# Patient Record
Sex: Female | Born: 1935 | Race: White | Hispanic: No | Smoking: Never smoker
Health system: Southern US, Community
[De-identification: ages and names within clinical notes are randomized; demographics above are authoritative.]

## PROBLEM LIST (undated history)

## (undated) DIAGNOSIS — R040 Epistaxis: Secondary | ICD-10-CM

## (undated) DIAGNOSIS — M75120 Complete rotator cuff tear or rupture of unspecified shoulder, not specified as traumatic: Secondary | ICD-10-CM

## (undated) DIAGNOSIS — L259 Unspecified contact dermatitis, unspecified cause: Secondary | ICD-10-CM

## (undated) DIAGNOSIS — N289 Disorder of kidney and ureter, unspecified: Secondary | ICD-10-CM

## (undated) DIAGNOSIS — F0390 Unspecified dementia without behavioral disturbance: Secondary | ICD-10-CM

## (undated) DIAGNOSIS — J449 Chronic obstructive pulmonary disease, unspecified: Secondary | ICD-10-CM

## (undated) DIAGNOSIS — M1990 Unspecified osteoarthritis, unspecified site: Secondary | ICD-10-CM

## (undated) DIAGNOSIS — N3090 Cystitis, unspecified without hematuria: Secondary | ICD-10-CM

## (undated) DIAGNOSIS — IMO0002 Reserved for concepts with insufficient information to code with codable children: Secondary | ICD-10-CM

## (undated) DIAGNOSIS — M8580 Other specified disorders of bone density and structure, unspecified site: Secondary | ICD-10-CM

## (undated) DIAGNOSIS — N12 Tubulo-interstitial nephritis, not specified as acute or chronic: Secondary | ICD-10-CM

## (undated) DIAGNOSIS — N179 Acute kidney failure, unspecified: Secondary | ICD-10-CM

## (undated) DIAGNOSIS — G459 Transient cerebral ischemic attack, unspecified: Secondary | ICD-10-CM

## (undated) DIAGNOSIS — T7840XA Allergy, unspecified, initial encounter: Secondary | ICD-10-CM

## (undated) DIAGNOSIS — G40909 Epilepsy, unspecified, not intractable, without status epilepticus: Secondary | ICD-10-CM

## (undated) DIAGNOSIS — I10 Essential (primary) hypertension: Secondary | ICD-10-CM

## (undated) DIAGNOSIS — F419 Anxiety disorder, unspecified: Secondary | ICD-10-CM

## (undated) HISTORY — DX: Complete rotator cuff tear or rupture of unspecified shoulder, not specified as traumatic: M75.120

## (undated) HISTORY — DX: Allergy, unspecified, initial encounter: T78.40XA

## (undated) HISTORY — DX: Epistaxis: R04.0

## (undated) HISTORY — DX: Tubulo-interstitial nephritis, not specified as acute or chronic: N12

## (undated) HISTORY — PX: JOINT REPLACEMENT: SHX530

## (undated) HISTORY — PX: ABDOMINAL HYSTERECTOMY: SHX81

## (undated) HISTORY — DX: Essential (primary) hypertension: I10

## (undated) HISTORY — DX: Other specified disorders of bone density and structure, unspecified site: M85.80

## (undated) HISTORY — DX: Transient cerebral ischemic attack, unspecified: G45.9

## (undated) HISTORY — DX: Disorder of kidney and ureter, unspecified: N28.9

## (undated) HISTORY — PX: BREAST SURGERY: SHX581

## (undated) HISTORY — DX: Unspecified contact dermatitis, unspecified cause: L25.9

---

## 1998-05-25 ENCOUNTER — Ambulatory Visit (HOSPITAL_COMMUNITY): Admission: RE | Admit: 1998-05-25 | Discharge: 1998-05-25 | Payer: Self-pay | Admitting: *Deleted

## 1998-07-03 ENCOUNTER — Inpatient Hospital Stay (HOSPITAL_COMMUNITY): Admission: EM | Admit: 1998-07-03 | Discharge: 1998-07-07 | Payer: Self-pay | Admitting: Emergency Medicine

## 1998-07-12 ENCOUNTER — Encounter: Payer: Self-pay | Admitting: Urology

## 1998-07-12 ENCOUNTER — Ambulatory Visit (HOSPITAL_COMMUNITY): Admission: RE | Admit: 1998-07-12 | Discharge: 1998-07-12 | Payer: Self-pay | Admitting: Urology

## 1998-12-06 ENCOUNTER — Emergency Department (HOSPITAL_COMMUNITY): Admission: EM | Admit: 1998-12-06 | Discharge: 1998-12-06 | Payer: Self-pay | Admitting: Emergency Medicine

## 1999-01-15 ENCOUNTER — Encounter: Admission: RE | Admit: 1999-01-15 | Discharge: 1999-04-15 | Payer: Self-pay | Admitting: Anesthesiology

## 1999-03-11 HISTORY — PX: BACK SURGERY: SHX140

## 1999-03-29 ENCOUNTER — Encounter: Payer: Self-pay | Admitting: Neurosurgery

## 1999-03-29 ENCOUNTER — Inpatient Hospital Stay (HOSPITAL_COMMUNITY): Admission: RE | Admit: 1999-03-29 | Discharge: 1999-04-02 | Payer: Self-pay | Admitting: Neurosurgery

## 1999-03-31 ENCOUNTER — Encounter: Payer: Self-pay | Admitting: Neurological Surgery

## 1999-09-10 ENCOUNTER — Encounter: Payer: Self-pay | Admitting: Neurosurgery

## 1999-09-10 ENCOUNTER — Encounter: Admission: RE | Admit: 1999-09-10 | Discharge: 1999-09-10 | Payer: Self-pay | Admitting: Neurosurgery

## 1999-09-24 ENCOUNTER — Ambulatory Visit (HOSPITAL_COMMUNITY): Admission: RE | Admit: 1999-09-24 | Discharge: 1999-09-24 | Payer: Self-pay | Admitting: Neurosurgery

## 1999-09-24 ENCOUNTER — Encounter: Payer: Self-pay | Admitting: Neurosurgery

## 1999-10-08 ENCOUNTER — Ambulatory Visit (HOSPITAL_COMMUNITY): Admission: RE | Admit: 1999-10-08 | Discharge: 1999-10-08 | Payer: Self-pay | Admitting: Neurosurgery

## 1999-10-08 ENCOUNTER — Encounter: Payer: Self-pay | Admitting: Neurosurgery

## 1999-10-22 ENCOUNTER — Ambulatory Visit: Admission: RE | Admit: 1999-10-22 | Discharge: 1999-10-22 | Payer: Self-pay | Admitting: Neurosurgery

## 1999-10-22 ENCOUNTER — Encounter: Payer: Self-pay | Admitting: Neurosurgery

## 2000-01-09 LAB — HM DEXA SCAN

## 2000-02-03 ENCOUNTER — Encounter: Payer: Self-pay | Admitting: Family Medicine

## 2000-02-03 ENCOUNTER — Other Ambulatory Visit: Admission: RE | Admit: 2000-02-03 | Discharge: 2000-02-03 | Payer: Self-pay | Admitting: Family Medicine

## 2000-02-11 ENCOUNTER — Encounter: Admission: RE | Admit: 2000-02-11 | Discharge: 2000-05-11 | Payer: Self-pay | Admitting: Family Medicine

## 2002-11-04 ENCOUNTER — Encounter: Admission: RE | Admit: 2002-11-04 | Discharge: 2002-11-04 | Payer: Self-pay | Admitting: Family Medicine

## 2002-11-04 ENCOUNTER — Encounter: Payer: Self-pay | Admitting: Family Medicine

## 2002-11-10 ENCOUNTER — Encounter: Payer: Self-pay | Admitting: *Deleted

## 2002-11-10 ENCOUNTER — Emergency Department (HOSPITAL_COMMUNITY): Admission: EM | Admit: 2002-11-10 | Discharge: 2002-11-10 | Payer: Self-pay | Admitting: *Deleted

## 2003-05-20 ENCOUNTER — Encounter: Payer: Self-pay | Admitting: Neurosurgery

## 2003-05-20 ENCOUNTER — Encounter: Admission: RE | Admit: 2003-05-20 | Discharge: 2003-05-20 | Payer: Self-pay | Admitting: Neurosurgery

## 2003-10-07 ENCOUNTER — Emergency Department (HOSPITAL_COMMUNITY): Admission: EM | Admit: 2003-10-07 | Discharge: 2003-10-07 | Payer: Self-pay | Admitting: Emergency Medicine

## 2004-05-08 ENCOUNTER — Other Ambulatory Visit: Admission: RE | Admit: 2004-05-08 | Discharge: 2004-05-08 | Payer: Self-pay | Admitting: Diagnostic Radiology

## 2004-09-11 ENCOUNTER — Ambulatory Visit: Payer: Self-pay | Admitting: Family Medicine

## 2004-09-23 ENCOUNTER — Ambulatory Visit: Payer: Self-pay | Admitting: Cardiology

## 2004-09-24 ENCOUNTER — Ambulatory Visit: Payer: Self-pay | Admitting: Family Medicine

## 2004-10-28 ENCOUNTER — Ambulatory Visit: Payer: Self-pay | Admitting: Cardiovascular Disease

## 2004-10-28 ENCOUNTER — Ambulatory Visit: Payer: Self-pay | Admitting: Family Medicine

## 2004-11-18 ENCOUNTER — Ambulatory Visit: Payer: Self-pay | Admitting: Cardiology

## 2004-11-25 ENCOUNTER — Ambulatory Visit: Payer: Self-pay | Admitting: *Deleted

## 2005-01-03 ENCOUNTER — Ambulatory Visit: Payer: Self-pay | Admitting: Family Medicine

## 2005-01-13 ENCOUNTER — Ambulatory Visit: Payer: Self-pay | Admitting: Family Medicine

## 2005-02-04 ENCOUNTER — Ambulatory Visit: Payer: Self-pay | Admitting: Family Medicine

## 2005-02-17 ENCOUNTER — Ambulatory Visit: Payer: Self-pay | Admitting: Cardiology

## 2005-02-24 ENCOUNTER — Ambulatory Visit: Payer: Self-pay | Admitting: *Deleted

## 2005-05-01 ENCOUNTER — Ambulatory Visit: Payer: Self-pay | Admitting: Family Medicine

## 2005-05-12 ENCOUNTER — Ambulatory Visit: Payer: Self-pay | Admitting: Family Medicine

## 2005-05-14 ENCOUNTER — Ambulatory Visit: Payer: Self-pay | Admitting: Internal Medicine

## 2005-05-20 ENCOUNTER — Ambulatory Visit: Payer: Self-pay | Admitting: Cardiology

## 2005-07-16 ENCOUNTER — Ambulatory Visit: Payer: Self-pay | Admitting: Family Medicine

## 2005-08-01 ENCOUNTER — Ambulatory Visit: Payer: Self-pay | Admitting: Family Medicine

## 2005-08-05 ENCOUNTER — Ambulatory Visit: Payer: Self-pay | Admitting: Family Medicine

## 2005-08-19 ENCOUNTER — Ambulatory Visit: Payer: Self-pay

## 2005-09-01 ENCOUNTER — Ambulatory Visit: Payer: Self-pay | Admitting: Family Medicine

## 2005-09-08 ENCOUNTER — Ambulatory Visit: Payer: Self-pay | Admitting: Internal Medicine

## 2005-09-15 ENCOUNTER — Ambulatory Visit: Payer: Self-pay | Admitting: Family Medicine

## 2005-10-08 ENCOUNTER — Ambulatory Visit: Payer: Self-pay

## 2005-10-14 ENCOUNTER — Ambulatory Visit: Payer: Self-pay | Admitting: Family Medicine

## 2005-10-20 ENCOUNTER — Ambulatory Visit: Payer: Self-pay | Admitting: Cardiology

## 2005-11-10 HISTORY — PX: SHOULDER SURGERY: SHX246

## 2005-11-19 ENCOUNTER — Ambulatory Visit: Payer: Self-pay | Admitting: Family Medicine

## 2005-11-25 ENCOUNTER — Emergency Department (HOSPITAL_COMMUNITY): Admission: EM | Admit: 2005-11-25 | Discharge: 2005-11-25 | Payer: Self-pay | Admitting: Emergency Medicine

## 2005-11-26 ENCOUNTER — Ambulatory Visit (HOSPITAL_COMMUNITY): Admission: RE | Admit: 2005-11-26 | Discharge: 2005-11-26 | Payer: Self-pay | Admitting: Emergency Medicine

## 2006-01-12 ENCOUNTER — Ambulatory Visit: Payer: Self-pay | Admitting: Cardiology

## 2006-01-21 ENCOUNTER — Ambulatory Visit: Payer: Self-pay | Admitting: Cardiology

## 2006-01-28 ENCOUNTER — Ambulatory Visit: Payer: Self-pay | Admitting: Family Medicine

## 2006-04-01 ENCOUNTER — Ambulatory Visit: Payer: Self-pay | Admitting: Family Medicine

## 2006-04-13 ENCOUNTER — Ambulatory Visit: Payer: Self-pay | Admitting: Family Medicine

## 2006-04-15 ENCOUNTER — Ambulatory Visit: Payer: Self-pay | Admitting: Family Medicine

## 2006-07-17 ENCOUNTER — Ambulatory Visit: Payer: Self-pay | Admitting: Family Medicine

## 2006-07-31 ENCOUNTER — Ambulatory Visit: Payer: Self-pay | Admitting: Family Medicine

## 2006-08-07 ENCOUNTER — Ambulatory Visit: Payer: Self-pay | Admitting: Family Medicine

## 2006-08-11 ENCOUNTER — Ambulatory Visit: Payer: Self-pay | Admitting: Internal Medicine

## 2006-08-14 ENCOUNTER — Ambulatory Visit: Payer: Self-pay | Admitting: *Deleted

## 2006-08-18 ENCOUNTER — Ambulatory Visit: Payer: Self-pay | Admitting: Family Medicine

## 2006-09-08 ENCOUNTER — Ambulatory Visit: Payer: Self-pay | Admitting: Family Medicine

## 2006-09-22 ENCOUNTER — Ambulatory Visit: Payer: Self-pay | Admitting: Family Medicine

## 2006-10-27 ENCOUNTER — Ambulatory Visit: Payer: Self-pay | Admitting: Family Medicine

## 2006-12-14 ENCOUNTER — Ambulatory Visit: Payer: Self-pay | Admitting: Family Medicine

## 2006-12-22 ENCOUNTER — Encounter: Admission: RE | Admit: 2006-12-22 | Discharge: 2006-12-22 | Payer: Self-pay | Admitting: Cardiology

## 2006-12-29 ENCOUNTER — Emergency Department (HOSPITAL_COMMUNITY): Admission: EM | Admit: 2006-12-29 | Discharge: 2006-12-29 | Payer: Self-pay | Admitting: Emergency Medicine

## 2007-01-04 ENCOUNTER — Ambulatory Visit: Payer: Self-pay | Admitting: Family Medicine

## 2007-01-04 LAB — CONVERTED CEMR LAB
ALT: 19 units/L (ref 0–40)
BUN: 14 mg/dL (ref 6–23)
CO2: 28 meq/L (ref 19–32)
Calcium: 8.6 mg/dL (ref 8.4–10.5)
Cholesterol: 153 mg/dL (ref 0–200)
GFR calc Af Amer: 70 mL/min
Glucose, Bld: 127 mg/dL — ABNORMAL HIGH (ref 70–99)
HDL: 31.6 mg/dL — ABNORMAL LOW (ref >39.0)
Hgb A1c MFr Bld: 7.5 %
Hgb A1c MFr Bld: 7.5 % — ABNORMAL HIGH (ref 4.6–6.0)
Phosphorus: 3.7 mg/dL (ref 2.3–4.6)
Sodium: 142 meq/L (ref 135–145)

## 2007-02-02 ENCOUNTER — Ambulatory Visit: Payer: Self-pay | Admitting: Family Medicine

## 2007-02-25 ENCOUNTER — Ambulatory Visit (HOSPITAL_COMMUNITY): Admission: RE | Admit: 2007-02-25 | Discharge: 2007-02-26 | Payer: Self-pay | Admitting: Orthopedic Surgery

## 2007-03-11 ENCOUNTER — Telehealth (INDEPENDENT_AMBULATORY_CARE_PROVIDER_SITE_OTHER): Payer: Self-pay | Admitting: *Deleted

## 2007-03-25 ENCOUNTER — Ambulatory Visit: Payer: Self-pay | Admitting: Family Medicine

## 2007-03-25 DIAGNOSIS — T887XXA Unspecified adverse effect of drug or medicament, initial encounter: Secondary | ICD-10-CM

## 2007-03-25 DIAGNOSIS — E119 Type 2 diabetes mellitus without complications: Secondary | ICD-10-CM | POA: Insufficient documentation

## 2007-03-26 LAB — CONVERTED CEMR LAB
ALT: 16 units/L (ref 0–40)
AST: 18 units/L (ref 0–37)
Calcium: 9.6 mg/dL (ref 8.4–10.5)
Chloride: 103 meq/L (ref 96–112)
GFR calc Af Amer: 70 mL/min
Glucose, Bld: 146 mg/dL — ABNORMAL HIGH (ref 70–99)
Phosphorus: 4.1 mg/dL (ref 2.3–4.6)
Potassium: 4.4 meq/L (ref 3.5–5.1)
Sodium: 141 meq/L (ref 135–145)

## 2007-04-26 ENCOUNTER — Emergency Department (HOSPITAL_COMMUNITY): Admission: EM | Admit: 2007-04-26 | Discharge: 2007-04-26 | Payer: Self-pay | Admitting: Emergency Medicine

## 2007-04-26 ENCOUNTER — Telehealth (INDEPENDENT_AMBULATORY_CARE_PROVIDER_SITE_OTHER): Payer: Self-pay | Admitting: *Deleted

## 2007-05-04 ENCOUNTER — Telehealth (INDEPENDENT_AMBULATORY_CARE_PROVIDER_SITE_OTHER): Payer: Self-pay | Admitting: *Deleted

## 2007-05-20 ENCOUNTER — Ambulatory Visit (HOSPITAL_COMMUNITY): Admission: RE | Admit: 2007-05-20 | Discharge: 2007-05-21 | Payer: Self-pay | Admitting: Orthopedic Surgery

## 2007-06-11 HISTORY — PX: ROTATOR CUFF REPAIR: SHX139

## 2007-06-18 ENCOUNTER — Telehealth (INDEPENDENT_AMBULATORY_CARE_PROVIDER_SITE_OTHER): Payer: Self-pay | Admitting: *Deleted

## 2007-07-06 ENCOUNTER — Telehealth: Payer: Self-pay | Admitting: Family Medicine

## 2007-08-06 ENCOUNTER — Ambulatory Visit: Payer: Self-pay | Admitting: Internal Medicine

## 2007-08-06 LAB — CONVERTED CEMR LAB
Bilirubin Urine: NEGATIVE
Blood in Urine, dipstick: NEGATIVE
Ketones, urine, test strip: NEGATIVE
Nitrite: NEGATIVE
Urobilinogen, UA: NEGATIVE

## 2007-08-07 ENCOUNTER — Encounter (INDEPENDENT_AMBULATORY_CARE_PROVIDER_SITE_OTHER): Payer: Self-pay | Admitting: Internal Medicine

## 2007-08-09 ENCOUNTER — Telehealth: Payer: Self-pay | Admitting: Family Medicine

## 2007-08-10 ENCOUNTER — Ambulatory Visit: Payer: Self-pay | Admitting: Family Medicine

## 2007-08-18 ENCOUNTER — Encounter: Payer: Self-pay | Admitting: Family Medicine

## 2007-10-06 ENCOUNTER — Ambulatory Visit: Payer: Self-pay | Admitting: Family Medicine

## 2007-10-09 ENCOUNTER — Emergency Department (HOSPITAL_COMMUNITY): Admission: EM | Admit: 2007-10-09 | Discharge: 2007-10-09 | Payer: Self-pay | Admitting: Emergency Medicine

## 2007-10-11 ENCOUNTER — Ambulatory Visit: Payer: Self-pay | Admitting: Family Medicine

## 2007-10-11 LAB — CONVERTED CEMR LAB
CO2: 29 meq/L (ref 19–32)
Chloride: 107 meq/L (ref 96–112)
GFR calc Af Amer: 62 mL/min
Glucose, Bld: 136 mg/dL — ABNORMAL HIGH (ref 70–99)
HDL: 25.5 mg/dL — ABNORMAL LOW (ref >39.0)
Potassium: 4 meq/L (ref 3.5–5.1)
Total CHOL/HDL Ratio: 6.4
Triglycerides: 138 mg/dL (ref 0–149)

## 2007-10-13 ENCOUNTER — Encounter: Payer: Self-pay | Admitting: Family Medicine

## 2007-10-13 DIAGNOSIS — M949 Disorder of cartilage, unspecified: Secondary | ICD-10-CM

## 2007-10-13 DIAGNOSIS — M75120 Complete rotator cuff tear or rupture of unspecified shoulder, not specified as traumatic: Secondary | ICD-10-CM

## 2007-10-13 DIAGNOSIS — M5030 Other cervical disc degeneration, unspecified cervical region: Secondary | ICD-10-CM

## 2007-10-13 DIAGNOSIS — M899 Disorder of bone, unspecified: Secondary | ICD-10-CM

## 2007-10-13 DIAGNOSIS — I10 Essential (primary) hypertension: Secondary | ICD-10-CM | POA: Insufficient documentation

## 2007-10-13 DIAGNOSIS — N390 Urinary tract infection, site not specified: Secondary | ICD-10-CM

## 2007-10-13 DIAGNOSIS — J309 Allergic rhinitis, unspecified: Secondary | ICD-10-CM | POA: Insufficient documentation

## 2007-10-13 DIAGNOSIS — E7800 Pure hypercholesterolemia, unspecified: Secondary | ICD-10-CM | POA: Insufficient documentation

## 2007-10-13 DIAGNOSIS — Z8679 Personal history of other diseases of the circulatory system: Secondary | ICD-10-CM | POA: Insufficient documentation

## 2007-10-13 DIAGNOSIS — N259 Disorder resulting from impaired renal tubular function, unspecified: Secondary | ICD-10-CM | POA: Insufficient documentation

## 2007-10-13 DIAGNOSIS — M479 Spondylosis, unspecified: Secondary | ICD-10-CM | POA: Insufficient documentation

## 2007-10-13 DIAGNOSIS — E042 Nontoxic multinodular goiter: Secondary | ICD-10-CM

## 2007-10-13 HISTORY — DX: Complete rotator cuff tear or rupture of unspecified shoulder, not specified as traumatic: M75.120

## 2007-10-14 ENCOUNTER — Ambulatory Visit: Payer: Self-pay | Admitting: Family Medicine

## 2007-11-12 ENCOUNTER — Telehealth: Payer: Self-pay | Admitting: Family Medicine

## 2007-11-12 ENCOUNTER — Observation Stay (HOSPITAL_COMMUNITY): Admission: EM | Admit: 2007-11-12 | Discharge: 2007-11-15 | Payer: Self-pay | Admitting: Emergency Medicine

## 2007-11-12 ENCOUNTER — Ambulatory Visit: Payer: Self-pay | Admitting: Internal Medicine

## 2007-11-14 ENCOUNTER — Encounter: Payer: Self-pay | Admitting: Family Medicine

## 2007-11-15 ENCOUNTER — Encounter: Payer: Self-pay | Admitting: Family Medicine

## 2007-11-25 ENCOUNTER — Ambulatory Visit: Payer: Self-pay | Admitting: Family Medicine

## 2007-11-29 ENCOUNTER — Telehealth: Payer: Self-pay | Admitting: Family Medicine

## 2007-12-02 ENCOUNTER — Encounter: Payer: Self-pay | Admitting: Family Medicine

## 2007-12-02 ENCOUNTER — Telehealth: Payer: Self-pay | Admitting: Family Medicine

## 2007-12-02 LAB — CONVERTED CEMR LAB
Bilirubin Urine: NEGATIVE
Blood in Urine, dipstick: NEGATIVE
Ketones, urine, test strip: NEGATIVE
Protein, U semiquant: 30
pH: 8.5

## 2007-12-03 ENCOUNTER — Encounter: Payer: Self-pay | Admitting: Family Medicine

## 2007-12-18 ENCOUNTER — Encounter: Payer: Self-pay | Admitting: Family Medicine

## 2008-01-11 ENCOUNTER — Telehealth: Payer: Self-pay | Admitting: Family Medicine

## 2008-01-12 ENCOUNTER — Ambulatory Visit: Payer: Self-pay | Admitting: Family Medicine

## 2008-01-24 ENCOUNTER — Ambulatory Visit: Payer: Self-pay | Admitting: Family Medicine

## 2008-01-25 LAB — CONVERTED CEMR LAB
Cholesterol: 206 mg/dL (ref 0–200)
Creatinine, Ser: 1 mg/dL (ref 0.4–1.2)
GFR calc Af Amer: 70 mL/min
GFR calc non Af Amer: 57 mL/min
HDL: 37.4 mg/dL — ABNORMAL LOW (ref >39.0)
Sodium: 142 meq/L (ref 135–145)

## 2008-02-04 ENCOUNTER — Telehealth: Payer: Self-pay | Admitting: Family Medicine

## 2008-02-09 ENCOUNTER — Telehealth: Payer: Self-pay | Admitting: Family Medicine

## 2008-02-14 ENCOUNTER — Encounter: Payer: Self-pay | Admitting: Family Medicine

## 2008-02-16 ENCOUNTER — Encounter: Payer: Self-pay | Admitting: Family Medicine

## 2008-02-25 ENCOUNTER — Encounter (INDEPENDENT_AMBULATORY_CARE_PROVIDER_SITE_OTHER): Payer: Self-pay | Admitting: *Deleted

## 2008-03-23 ENCOUNTER — Encounter: Payer: Self-pay | Admitting: Family Medicine

## 2008-04-26 ENCOUNTER — Ambulatory Visit: Payer: Self-pay | Admitting: Family Medicine

## 2008-04-26 DIAGNOSIS — E669 Obesity, unspecified: Secondary | ICD-10-CM | POA: Insufficient documentation

## 2008-04-27 LAB — CONVERTED CEMR LAB
Albumin: 3.7 g/dL (ref 3.5–5.2)
BUN: 21 mg/dL (ref 6–23)
Calcium: 9.9 mg/dL (ref 8.4–10.5)
Creatinine, Ser: 1.1 mg/dL (ref 0.4–1.2)
Creatinine,U: 122.4 mg/dL
GFR calc Af Amer: 62 mL/min
Glucose, Bld: 137 mg/dL — ABNORMAL HIGH (ref 70–99)
HDL: 33.5 mg/dL — ABNORMAL LOW (ref >39.0)
Microalb Creat Ratio: 13.1 mg/g (ref 0.0–30.0)
Microalb, Ur: 1.6 mg/dL (ref 0.0–1.9)
Phosphorus: 3.8 mg/dL (ref 2.3–4.6)
Total CHOL/HDL Ratio: 6
Triglycerides: 167 mg/dL — ABNORMAL HIGH (ref 0–149)
VLDL: 33 mg/dL (ref 0–40)

## 2008-05-02 ENCOUNTER — Encounter: Payer: Self-pay | Admitting: Family Medicine

## 2008-05-09 IMAGING — CR DG LUMBAR SPINE COMPLETE 4+V
5 series · 5 of 5 positions shown · non-contrast
Comparison: None.

CLINICAL DATA: Fall, now with hip pain.
 LUMBAR SPINE ? 4 VIEW:

[t l-spine a.p.]
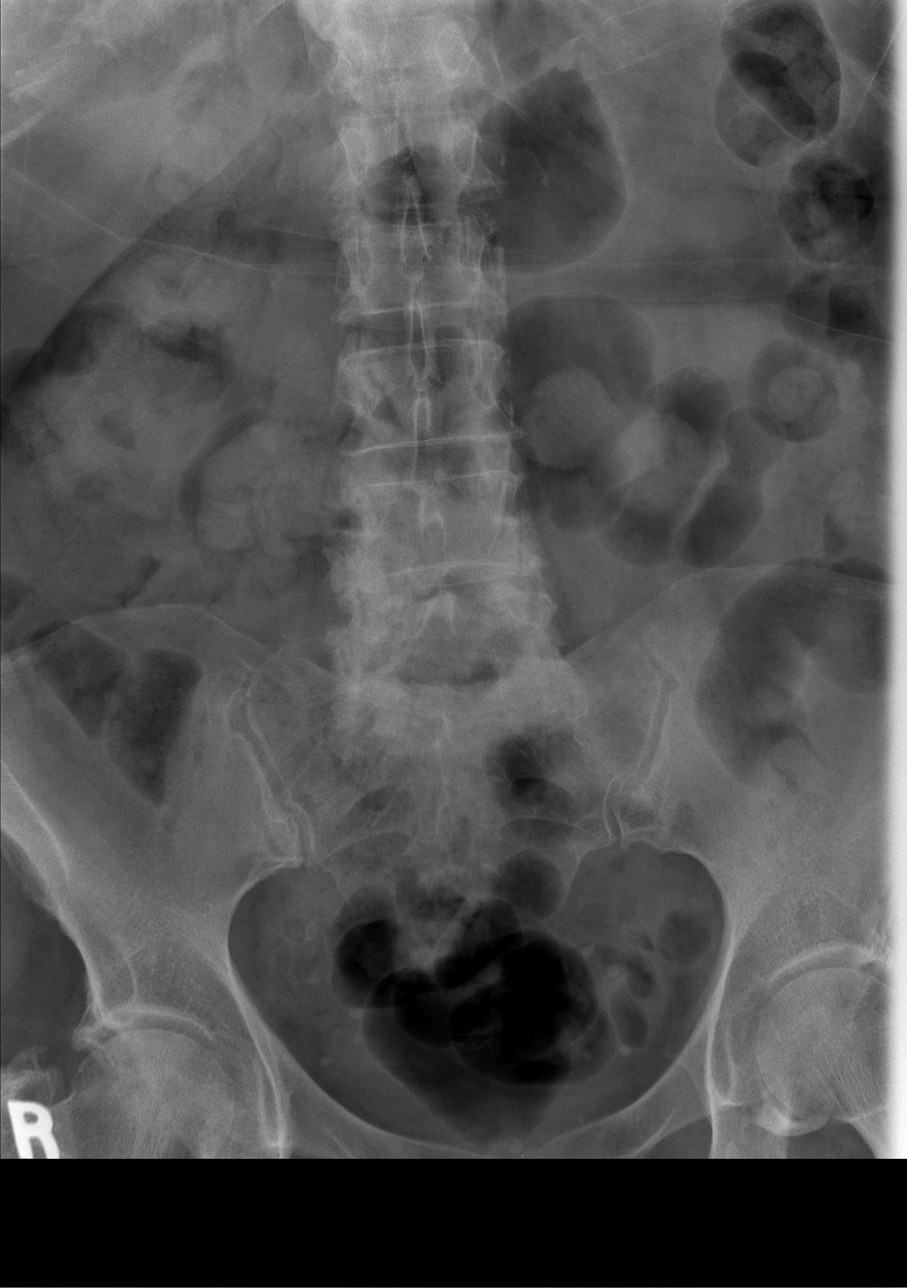

[t l-spine oblique exposure (1 of 2)]
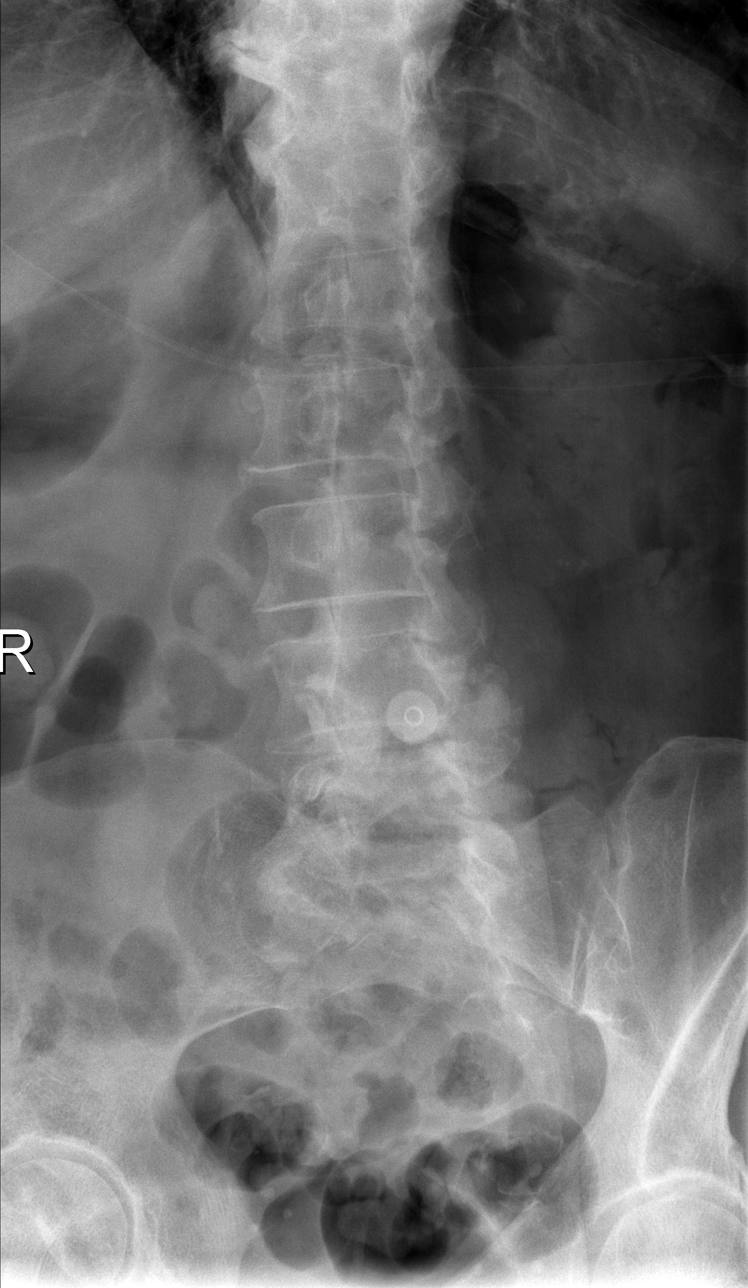

[t l-spine oblique exposure (2 of 2)]
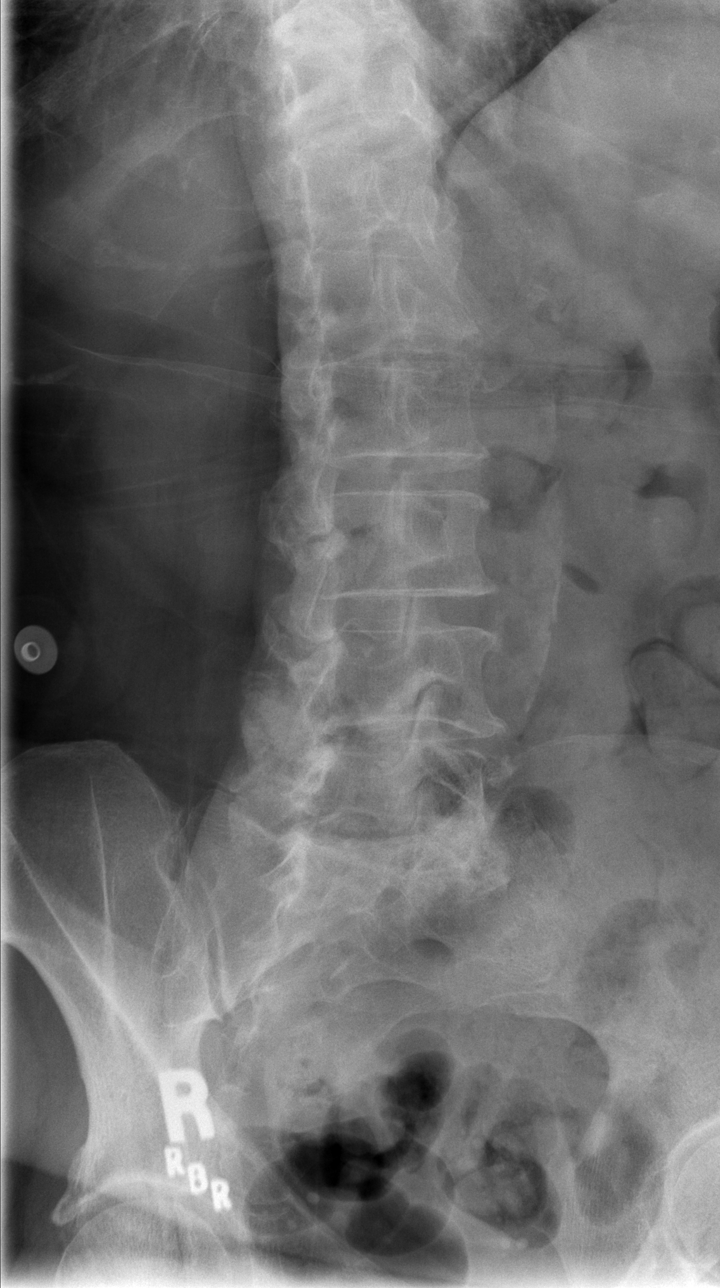

[t l-spine lat]
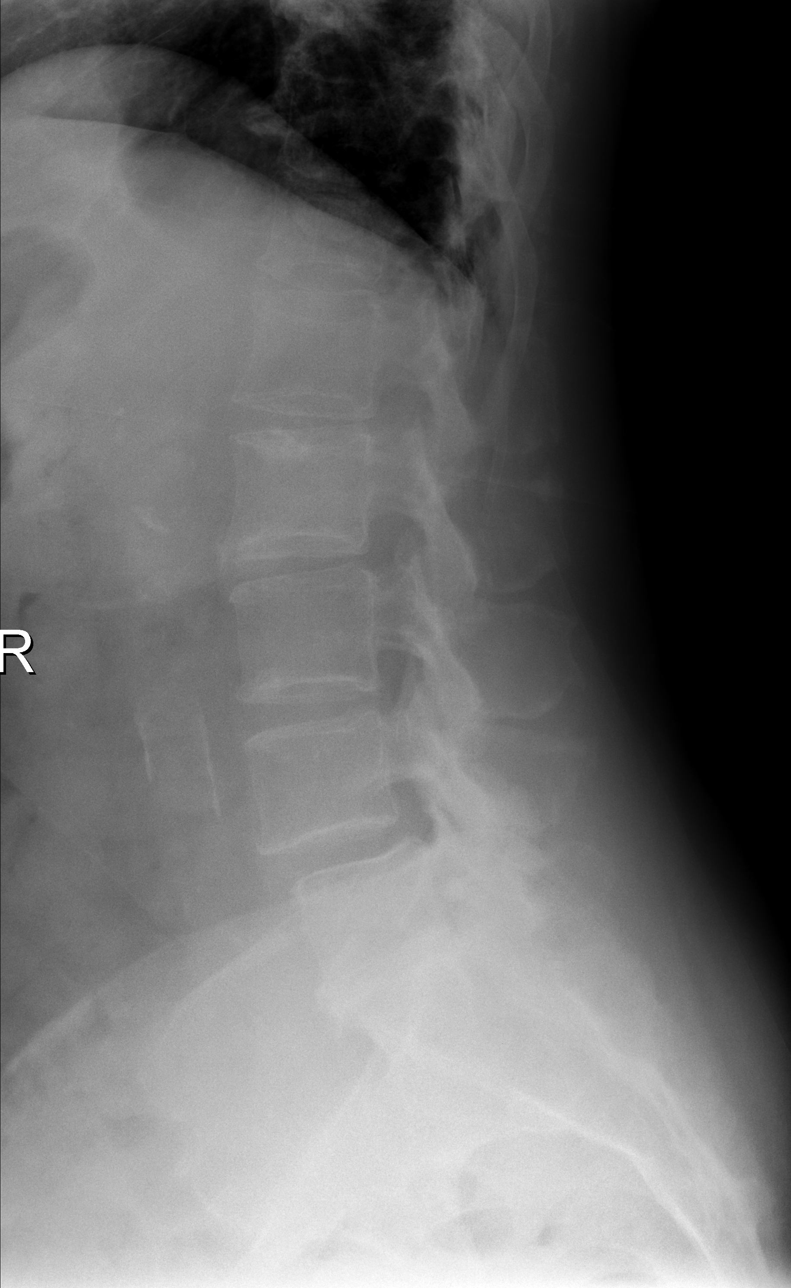

[t l-spine l5-s1 spot]
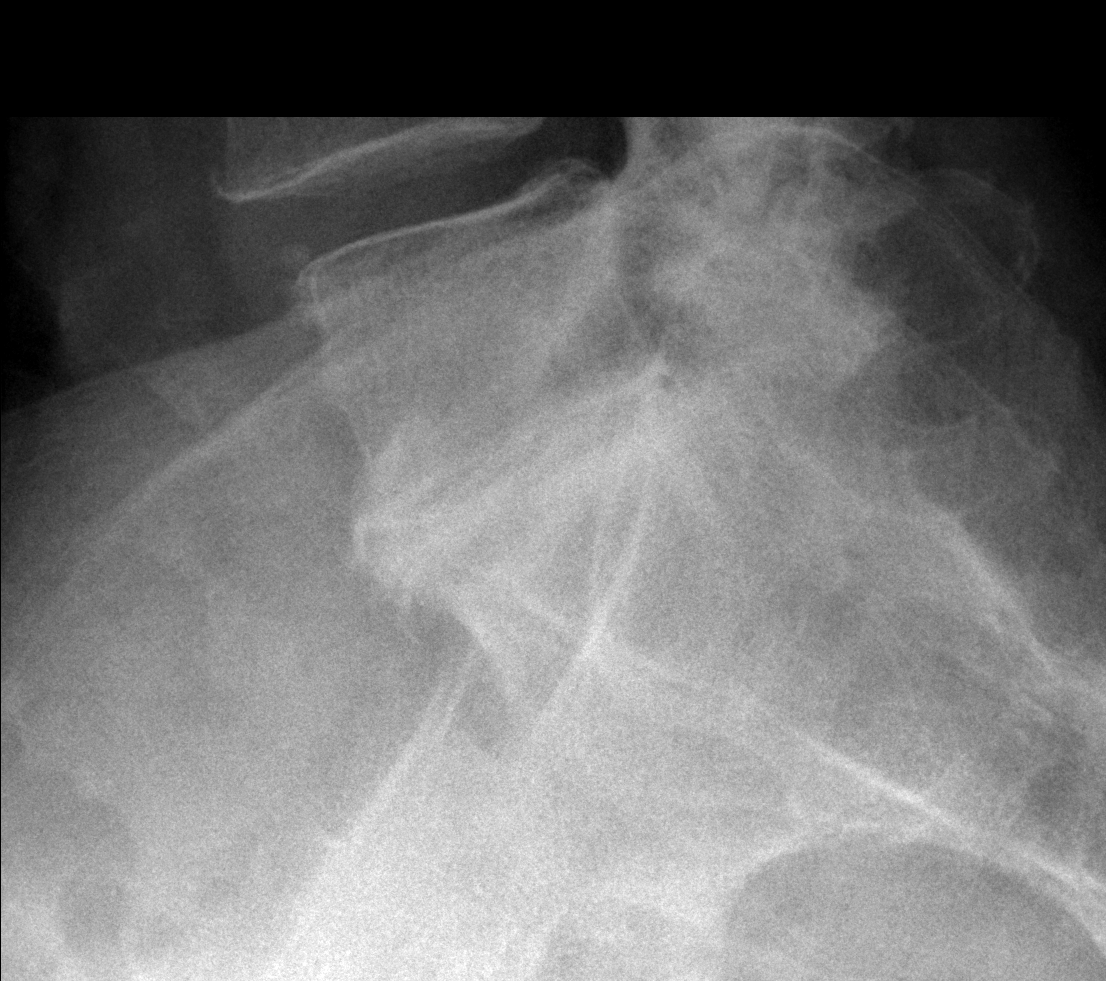

[5 of 5 positions shown; findings below may reference images not displayed]

FINDINGS: There is a mild first-degree anterolisthesis of L4 on L5.
 Advanced degenerative disc disease at the L5-S1 level is identified.  
 The vertebral body heights are well preserved.
 There is marked facet degenerative change and hypertrophy affecting the lower lumbar spine.
IMPRESSION: 1. No acute findings. 
 2. L4 on L5 anterolisthesis.
 3. Advanced degenerative disc disease at L5-S1.

## 2008-05-10 ENCOUNTER — Telehealth: Payer: Self-pay | Admitting: Family Medicine

## 2008-05-15 ENCOUNTER — Encounter: Payer: Self-pay | Admitting: Family Medicine

## 2008-06-09 ENCOUNTER — Telehealth (INDEPENDENT_AMBULATORY_CARE_PROVIDER_SITE_OTHER): Payer: Self-pay | Admitting: *Deleted

## 2008-06-22 ENCOUNTER — Telehealth (INDEPENDENT_AMBULATORY_CARE_PROVIDER_SITE_OTHER): Payer: Self-pay | Admitting: *Deleted

## 2008-07-21 ENCOUNTER — Telehealth (INDEPENDENT_AMBULATORY_CARE_PROVIDER_SITE_OTHER): Payer: Self-pay | Admitting: *Deleted

## 2008-07-25 ENCOUNTER — Ambulatory Visit: Payer: Self-pay | Admitting: Family Medicine

## 2008-07-26 LAB — CONVERTED CEMR LAB
ALT: 20 units/L (ref 0–35)
AST: 24 units/L (ref 0–37)
BUN: 21 mg/dL (ref 6–23)
Direct LDL: 162.2 mg/dL
HDL: 34.6 mg/dL — ABNORMAL LOW (ref >39.0)
Potassium: 3.8 meq/L (ref 3.5–5.1)
Sodium: 142 meq/L (ref 135–145)
Total CHOL/HDL Ratio: 5.9
Triglycerides: 139 mg/dL (ref 0–149)
VLDL: 28 mg/dL (ref 0–40)

## 2008-09-04 ENCOUNTER — Encounter: Payer: Self-pay | Admitting: Family Medicine

## 2008-09-05 ENCOUNTER — Encounter (INDEPENDENT_AMBULATORY_CARE_PROVIDER_SITE_OTHER): Payer: Self-pay | Admitting: *Deleted

## 2008-10-03 ENCOUNTER — Telehealth: Payer: Self-pay | Admitting: Family Medicine

## 2008-12-02 ENCOUNTER — Encounter: Payer: Self-pay | Admitting: Family Medicine

## 2009-01-05 ENCOUNTER — Encounter: Payer: Self-pay | Admitting: Family Medicine

## 2009-01-20 ENCOUNTER — Encounter: Payer: Self-pay | Admitting: Family Medicine

## 2009-01-23 ENCOUNTER — Telehealth: Payer: Self-pay | Admitting: Family Medicine

## 2009-01-23 ENCOUNTER — Encounter: Payer: Self-pay | Admitting: Family Medicine

## 2009-01-30 ENCOUNTER — Ambulatory Visit: Payer: Self-pay | Admitting: Family Medicine

## 2009-02-14 ENCOUNTER — Encounter: Payer: Self-pay | Admitting: Family Medicine

## 2009-02-15 ENCOUNTER — Encounter (INDEPENDENT_AMBULATORY_CARE_PROVIDER_SITE_OTHER): Payer: Self-pay | Admitting: Orthopedic Surgery

## 2009-02-15 ENCOUNTER — Encounter (INDEPENDENT_AMBULATORY_CARE_PROVIDER_SITE_OTHER): Payer: Self-pay | Admitting: *Deleted

## 2009-02-15 ENCOUNTER — Ambulatory Visit (HOSPITAL_COMMUNITY): Admission: RE | Admit: 2009-02-15 | Discharge: 2009-02-16 | Payer: Self-pay | Admitting: Orthopedic Surgery

## 2009-02-19 ENCOUNTER — Telehealth (INDEPENDENT_AMBULATORY_CARE_PROVIDER_SITE_OTHER): Payer: Self-pay | Admitting: *Deleted

## 2009-02-26 ENCOUNTER — Ambulatory Visit: Payer: Self-pay | Admitting: Family Medicine

## 2009-02-27 LAB — CONVERTED CEMR LAB
BUN: 19 mg/dL (ref 6–23)
Calcium: 8.9 mg/dL (ref 8.4–10.5)
Chloride: 107 meq/L (ref 96–112)
Cholesterol: 200 mg/dL (ref 0–200)
Glucose, Bld: 140 mg/dL — ABNORMAL HIGH (ref 70–99)
HDL: 33.4 mg/dL — ABNORMAL LOW (ref >39.00)
LDL Cholesterol: 139 mg/dL — ABNORMAL HIGH (ref 0–99)
Potassium: 4.4 meq/L (ref 3.5–5.1)
TSH: 2.72 microintl units/mL (ref 0.35–5.50)
Total CHOL/HDL Ratio: 6
Triglycerides: 140 mg/dL (ref 0.0–149.0)
VLDL: 28 mg/dL (ref 0.0–40.0)

## 2009-04-24 ENCOUNTER — Ambulatory Visit: Payer: Self-pay | Admitting: Family Medicine

## 2009-05-16 ENCOUNTER — Telehealth: Payer: Self-pay | Admitting: Family Medicine

## 2009-05-24 ENCOUNTER — Ambulatory Visit: Payer: Self-pay | Admitting: Family Medicine

## 2009-06-20 ENCOUNTER — Ambulatory Visit: Payer: Self-pay | Admitting: Family Medicine

## 2009-06-20 DIAGNOSIS — R609 Edema, unspecified: Secondary | ICD-10-CM

## 2009-06-21 LAB — CONVERTED CEMR LAB
Alkaline Phosphatase: 57 units/L (ref 39–117)
BUN: 26 mg/dL — ABNORMAL HIGH (ref 6–23)
Creatinine, Ser: 1.1 mg/dL (ref 0.4–1.2)
Glucose, Bld: 141 mg/dL — ABNORMAL HIGH (ref 70–99)
Potassium: 3.6 meq/L (ref 3.5–5.1)
Sodium: 141 meq/L (ref 135–145)
TSH: 2.19 microintl units/mL (ref 0.35–5.50)
Total Bilirubin: 0.8 mg/dL (ref 0.3–1.2)

## 2009-06-26 ENCOUNTER — Telehealth: Payer: Self-pay | Admitting: Family Medicine

## 2009-06-28 ENCOUNTER — Telehealth: Payer: Self-pay | Admitting: Family Medicine

## 2009-06-28 DIAGNOSIS — I872 Venous insufficiency (chronic) (peripheral): Secondary | ICD-10-CM | POA: Insufficient documentation

## 2009-07-04 ENCOUNTER — Encounter: Payer: Self-pay | Admitting: Family Medicine

## 2009-07-25 ENCOUNTER — Encounter: Payer: Self-pay | Admitting: Family Medicine

## 2009-07-27 ENCOUNTER — Telehealth: Payer: Self-pay | Admitting: Family Medicine

## 2009-07-30 ENCOUNTER — Encounter: Payer: Self-pay | Admitting: Family Medicine

## 2009-08-06 ENCOUNTER — Encounter: Payer: Self-pay | Admitting: Family Medicine

## 2009-08-06 ENCOUNTER — Telehealth: Payer: Self-pay | Admitting: Family Medicine

## 2009-08-07 ENCOUNTER — Ambulatory Visit: Payer: Self-pay | Admitting: Family Medicine

## 2009-08-14 ENCOUNTER — Encounter: Payer: Self-pay | Admitting: Family Medicine

## 2009-08-23 ENCOUNTER — Encounter: Payer: Self-pay | Admitting: Family Medicine

## 2009-09-11 ENCOUNTER — Ambulatory Visit: Payer: Self-pay | Admitting: Family Medicine

## 2009-09-11 LAB — CONVERTED CEMR LAB: Blood Glucose, Fingerstick: 132

## 2009-09-14 LAB — CONVERTED CEMR LAB
Albumin: 4 g/dL (ref 3.5–5.2)
CO2: 30 meq/L (ref 19–32)
Chloride: 105 meq/L (ref 96–112)
Cholesterol: 206 mg/dL — ABNORMAL HIGH (ref 0–200)
Creatinine, Ser: 1.1 mg/dL (ref 0.4–1.2)
Direct LDL: 154.6 mg/dL
Glucose, Bld: 114 mg/dL — ABNORMAL HIGH (ref 70–99)
Phosphorus: 2.8 mg/dL (ref 2.3–4.6)
Sodium: 141 meq/L (ref 135–145)
Triglycerides: 131 mg/dL (ref 0.0–149.0)
VLDL: 26.2 mg/dL (ref 0.0–40.0)

## 2009-09-19 ENCOUNTER — Telehealth: Payer: Self-pay | Admitting: Family Medicine

## 2009-09-28 ENCOUNTER — Telehealth (INDEPENDENT_AMBULATORY_CARE_PROVIDER_SITE_OTHER): Payer: Self-pay | Admitting: *Deleted

## 2009-11-07 ENCOUNTER — Telehealth: Payer: Self-pay | Admitting: Family Medicine

## 2009-11-13 ENCOUNTER — Ambulatory Visit: Payer: Self-pay | Admitting: Family Medicine

## 2009-11-19 ENCOUNTER — Telehealth: Payer: Self-pay | Admitting: Family Medicine

## 2010-01-16 ENCOUNTER — Ambulatory Visit: Payer: Self-pay | Admitting: Family Medicine

## 2010-01-17 LAB — CONVERTED CEMR LAB
ALT: 17 units/L (ref 0–35)
AST: 20 units/L (ref 0–37)
Calcium: 9.6 mg/dL (ref 8.4–10.5)
Chloride: 111 meq/L (ref 96–112)
Creatinine, Ser: 1.1 mg/dL (ref 0.4–1.2)
Glucose, Bld: 118 mg/dL — ABNORMAL HIGH (ref 70–99)
Phosphorus: 3.9 mg/dL (ref 2.3–4.6)
Sodium: 145 meq/L (ref 135–145)

## 2010-01-23 ENCOUNTER — Encounter: Payer: Self-pay | Admitting: Family Medicine

## 2010-02-04 ENCOUNTER — Telehealth: Payer: Self-pay | Admitting: Family Medicine

## 2010-02-04 ENCOUNTER — Encounter: Payer: Self-pay | Admitting: Family Medicine

## 2010-02-28 ENCOUNTER — Telehealth: Payer: Self-pay | Admitting: Family Medicine

## 2010-03-29 ENCOUNTER — Ambulatory Visit: Payer: Self-pay | Admitting: Family Medicine

## 2010-04-29 ENCOUNTER — Encounter: Payer: Self-pay | Admitting: Family Medicine

## 2010-05-01 ENCOUNTER — Encounter: Payer: Self-pay | Admitting: Family Medicine

## 2010-05-01 ENCOUNTER — Encounter (INDEPENDENT_AMBULATORY_CARE_PROVIDER_SITE_OTHER): Payer: Self-pay | Admitting: *Deleted

## 2010-05-16 ENCOUNTER — Telehealth: Payer: Self-pay | Admitting: Family Medicine

## 2010-06-19 ENCOUNTER — Ambulatory Visit: Payer: Self-pay | Admitting: Family Medicine

## 2010-06-20 ENCOUNTER — Encounter: Payer: Self-pay | Admitting: Family Medicine

## 2010-06-21 ENCOUNTER — Ambulatory Visit: Payer: Self-pay

## 2010-06-21 ENCOUNTER — Encounter: Payer: Self-pay | Admitting: Family Medicine

## 2010-06-24 ENCOUNTER — Ambulatory Visit: Payer: Self-pay | Admitting: Family Medicine

## 2010-06-25 ENCOUNTER — Emergency Department (HOSPITAL_COMMUNITY): Admission: EM | Admit: 2010-06-25 | Discharge: 2010-06-25 | Payer: Self-pay | Admitting: Emergency Medicine

## 2010-06-25 ENCOUNTER — Telehealth: Payer: Self-pay | Admitting: Family Medicine

## 2010-06-28 ENCOUNTER — Ambulatory Visit: Payer: Self-pay | Admitting: Family Medicine

## 2010-07-02 ENCOUNTER — Telehealth: Payer: Self-pay | Admitting: Family Medicine

## 2010-07-08 ENCOUNTER — Ambulatory Visit: Payer: Self-pay | Admitting: Family Medicine

## 2010-07-08 ENCOUNTER — Telehealth: Payer: Self-pay | Admitting: Family Medicine

## 2010-07-11 ENCOUNTER — Telehealth: Payer: Self-pay | Admitting: Family Medicine

## 2010-07-11 ENCOUNTER — Encounter: Payer: Self-pay | Admitting: Family Medicine

## 2010-07-22 ENCOUNTER — Encounter: Payer: Self-pay | Admitting: Family Medicine

## 2010-07-29 ENCOUNTER — Ambulatory Visit: Payer: Self-pay | Admitting: Family Medicine

## 2010-07-30 LAB — CONVERTED CEMR LAB
Albumin: 4 g/dL (ref 3.5–5.2)
BUN: 28 mg/dL — ABNORMAL HIGH (ref 6–23)
CO2: 27 meq/L (ref 19–32)
Chloride: 105 meq/L (ref 96–112)
GFR calc non Af Amer: 51.56 mL/min (ref >60)
Glucose, Bld: 124 mg/dL — ABNORMAL HIGH (ref 70–99)
HDL: 40.3 mg/dL (ref >39.00)
Potassium: 4.4 meq/L (ref 3.5–5.1)
Sodium: 142 meq/L (ref 135–145)
TSH: 1.95 microintl units/mL (ref 0.35–5.50)
Total CHOL/HDL Ratio: 6
Triglycerides: 137 mg/dL (ref 0.0–149.0)

## 2010-09-17 ENCOUNTER — Encounter: Payer: Self-pay | Admitting: Family Medicine

## 2010-10-08 ENCOUNTER — Telehealth: Payer: Self-pay | Admitting: Family Medicine

## 2010-10-10 ENCOUNTER — Inpatient Hospital Stay (HOSPITAL_COMMUNITY)
Admission: RE | Admit: 2010-10-10 | Discharge: 2010-10-14 | Payer: Self-pay | Source: Home / Self Care | Admitting: Orthopedic Surgery

## 2010-11-19 ENCOUNTER — Ambulatory Visit
Admission: RE | Admit: 2010-11-19 | Discharge: 2010-11-19 | Payer: Self-pay | Source: Home / Self Care | Attending: Family Medicine | Admitting: Family Medicine

## 2010-11-19 ENCOUNTER — Other Ambulatory Visit: Payer: Self-pay | Admitting: Family Medicine

## 2010-11-19 LAB — HEMOGLOBIN A1C: Hgb A1c MFr Bld: 6.3 % (ref 4.6–6.5)

## 2010-11-19 LAB — HM DIABETES FOOT EXAM

## 2010-11-30 ENCOUNTER — Encounter: Payer: Self-pay | Admitting: Neurology

## 2010-12-09 ENCOUNTER — Telehealth: Payer: Self-pay | Admitting: Family Medicine

## 2010-12-12 NOTE — Progress Notes (Signed)
Summary: Patient at [facility] ER  Phone Note From Other Clinic Call back at [phone]   Caller: Dr. Silvestre Gunner ER Call For: Dr. Milinda Antis Summary of Call: Received a call from Dr. Melina Fiddler stating that he has the patient in the ER now and needs to know what medication patient was put on yesterday?  Information given per EMR notes. Dr. Melina Fiddler stated that patient came there because after taking her new medication today she got real nauseated, dizzy and lightheaded. Copy of patient's chart summary sent to Dr. Melina Fiddler at [phone]. Initial call taken by: Sydell Axon LPN,  June 25, 2010 3:37 PM  Follow-up for Phone Call        thanks for sending that -- she was started on keflex yesterday  please call for ER note when it is availible Follow-up by: Judith Part MD,  June 25, 2010 4:15 PM  Additional Follow-up for Phone Call Additional follow up Details #1::        ER report is on your shelf in the in box. Thank you.Lewanda Rife LPN  June 26, 2010 9:58 AM

## 2010-12-12 NOTE — Progress Notes (Signed)
Summary: refill request for tesselon  Phone Note Refill Request Message from:  Fax from Pharmacy  Refills Requested: Medication #1:  TESSALON 200 MG CAPS 1 by mouth up to three times a day as needed cough.   Last Refilled: 11/07/2009 Faxed request from [place].  Initial call taken by: Lowella Petties CMA,  February 28, 2010 3:38 PM    Prescriptions: TESSALON 200 MG CAPS (BENZONATATE) 1 by mouth up to three times a day as needed cough  #30 x 0   Entered and Authorized by:   Ruthe Mannan MD   Signed by:   Ruthe Mannan MD on 02/28/2010   Method used:   Electronically to        [facility]* (retail)       [address]       [place], [place]  [address]       Ph: [phone]       Fax: [phone]   RxID:   [id number]

## 2010-12-12 NOTE — Assessment & Plan Note (Signed)
Summary: FOLLOW UP   Vital Signs:  Patient profile:   77 year old female Height:      64 inches Weight:      230.25 pounds BMI:     39.67 Temp:     98.1 degrees F oral Pulse rate:   76 / minute Pulse rhythm:   regular BP sitting:   124 / 78  (left arm) Cuff size:   large  Vitals Entered By: Lewanda Rife LPN (November 19, 2010 8:12 AM) CC: follow-up visit after knee surgery   History of Present Illness: here for f/u of HTN and lipids and DM after knee surgery went to get her patella replaced? -- it was broken into many peices -- was very painful still some pain and swelling- but improving  is finally walking better   not able to wear support stockings yet   no other new issues  thinks she is coming down with a cold   now other knee bothers her due to favoring other leg    kidney fxn ok this check   wt is stable with bmi 39  HTN in good control 124/78 today   lipids are up with trig of 137 and HDL 40 and LDL 164 from 120s non tol all statins  pt states she had not changed diet  very little red meat , no fried foods , no eggs , no shellfish , no butter , 2% milk, once in a while sausage- less than once per month    AIC 7.0 up from 6.7   changed to white wheat bread / no excessive starch/ very little sweets  cereal or grits in am  has gone to DM classes - knows what to eat  checks sugar infrequently -- is "ok"    Allergies: 1)  ! Vioxx 2)  ! Nsaids 3)  ! Lipitor 4)  ! Cortisone 5)  ! Crestor 6)  ! Glucophage 7)  ! * Vytorin 8)  ! Codeine 9)  ! Zocor 10)  ! Toradol 11)  ! Spironolactone 12)  ! * Tape (Medical) 13)  ! * Mucinex 14)  ! Duragesic-100 15)  ! Zyrtec Allergy 16)  ! Amoxicillin 17)  ! Amoxicillin 18)  ! * Hctz 19)  ! * Lisinopril 20)  ! Penicillin V Potassium (Penicillin V Potassium) 21)  ! Keflex 22)  ! Penicillin  Past History:  Past Medical History: Last updated: 09/11/2009 Allergic rhinitis Diabetes mellitus, type  II Hypertension Osteopenia Renal insufficiency Transient ischemic attack, hx of contact derm- buttocks ( uses steroid cream as needed)   cardilology---Dr Patty Sermons  Family History: Last updated: 07/25/2008 Father: strokes Mother: strokes  Social History: Last updated: 04/26/2008 Marital Status: single Children: 3 Occupation: retired non smoker no alcohol  Risk Factors: Smoking Status: never (10/13/2007)  Past Surgical History: rotator cuff surgery right 8/08 Hysterectomy, 1 ovary- polyps (1973) Back surgery- dsk (03/1999) Kidney stone procedure (1999) Pyelonephritis Recurrent nosebleeds- cauterization Breast biopsy x 2- neg Dexa- osteopenia, hip (01/2000) MRI- chiari malformation (10/1997) Fall- rotator cuff tear (2004) MRI neck- thyroid nodule (02/2004) Adenosine cardiolite- neg (08/2004) 2D Echo- mod LVH (05/2005) MRI brain- neg CVA, ethmoid sinusitis, stable chiari malf (08/2005) Left shoiulder surgery (2007) L-5 nerve root block (10/2006) 2D echo mild LVH with impaired relaxation and mild aortic sclerosis 4/09 (read as no sig change from 06) shoulder surgery R knee surgery -- for patella replacement   Review of Systems General:  Denies chills and fever. Eyes:  Denies  blurring and eye irritation. CV:  Denies chest pain or discomfort, lightheadness, and palpitations. Resp:  Denies cough, shortness of breath, and wheezing. GI:  Denies abdominal pain, bloody stools, change in bowel habits, and indigestion. GU:  Denies dysuria and urinary frequency. MS:  Complains of joint pain and joint swelling. Derm:  Denies itching, lesion(s), poor wound healing, and rash. Neuro:  Denies numbness and tingling. Psych:  mood is fair . Endo:  Denies excessive thirst and excessive urination. Heme:  Denies abnormal bruising and bleeding.  Physical Exam  General:  overweight but generally well appearing  Head:  normocephalic, atraumatic, and no abnormalities observed.    Eyes:  vision grossly intact, pupils equal, pupils round, and pupils reactive to light.  no conjunctival pallor, injection or icterus  Mouth:  pharynx pink and moist.   Neck:  supple with full rom and no masses or thyromegally, no JVD or carotid bruit  Chest Wall:  No deformities, masses, or tenderness noted. Lungs:  Normal respiratory effort, chest expands symmetrically. Lungs are clear to auscultation, no crackles or wheezes. Heart:  Normal rate and regular rhythm. S1 and S2 normal without gallop, murmur, click, rub or other extra sounds. Abdomen:  soft, non-tender, and normal bowel sounds.  no renal bruits  Msk:  poor rom on knee with swelling and healing incision  Pulses:  R and L carotid,radial,femoral,dorsalis pedis and posterior tibial pulses are full and equal bilaterally Extremities:  no cce  Neurologic:  sensation intact to light touch and DTRs symmetrical and normal.   Skin:  Intact without suspicious lesions or rashes Cervical Nodes:  No lymphadenopathy noted Inguinal Nodes:  No significant adenopathy Psych:  normal affect, talkative and pleasant   Diabetes Management Exam:    Foot Exam (with socks and/or shoes not present):       Sensory-Pinprick/Light touch:          Left medial foot (L-4): normal          Left dorsal foot (L-5): normal          Left lateral foot (S-1): normal          Right medial foot (L-4): normal          Right dorsal foot (L-5): normal          Right lateral foot (S-1): normal       Sensory-Monofilament:          Left foot: normal          Right foot: normal       Inspection:          Left foot: normal          Right foot: normal       Nails:          Left foot: normal          Right foot: normal   Impression & Recommendations:  Problem # 1:  DIABETES MELLITUS, TYPE II (ICD-250.00) Assessment Deteriorated  DM is worse with AIc of 7.0 diet - is a little changed -- adv rev DM materials from her ed  work on wt loss check sugar two times a  day and f/u 4-6 wk AIC today  Her updated medication list for this problem includes:    Adult Aspirin Ec Low Strength 81 Mg Tbec (Aspirin) .Marland Kitchen... Take 1 tablet by mouth once a day    Cozaar 25 Mg Tabs (Losartan potassium) .Marland Kitchen... 1 by mouth once daily  Orders: Venipuncture (04540) TLB-A1C /  Hgb A1C (Glycohemoglobin) (83036-A1C)  Labs Reviewed: Creat: 1.1 (07/29/2010)     Last Eye Exam: normal (11/10/2006) Reviewed HgBA1c results: 7.0 (07/29/2010)  6.7 (01/16/2010)  Problem # 2:  HYPERTENSION (ICD-401.9) Assessment: Unchanged  good control - no changes   Her updated medication list for this problem includes:    Furosemide 40 Mg Tabs (Furosemide) .Marland Kitchen... 1 by mouth once daily  is from dr Patty Sermons    Cozaar 25 Mg Tabs (Losartan potassium) .Marland Kitchen... 1 by mouth once daily  BP today: 124/78 Prior BP: 118/78 (07/29/2010)  Labs Reviewed: K+: 4.4 (07/29/2010) Creat: : 1.1 (07/29/2010)   Chol: 226 (07/29/2010)   HDL: 40.30 (07/29/2010)   LDL: 126 (01/16/2010)   TG: 137.0 (07/29/2010)  Problem # 3:  HYPERCHOLESTEROLEMIA (ICD-272.0) Assessment: Deteriorated  this is up - ? why counseled on diet  disc sat fats - rev this in detail  will review diet journal in 4-6 wk at f/u  Labs Reviewed: SGOT: 20 (01/16/2010)   SGPT: 17 (01/16/2010)   HDL:40.30 (07/29/2010), 44.10 (01/16/2010)  LDL:126 (01/16/2010), 139 (02/26/2009)  Chol:226 (07/29/2010), 195 (01/16/2010)  Trig:137.0 (07/29/2010), 126.0 (01/16/2010)  Complete Medication List: 1)  Fosamax 70 Mg Tabs (Alendronate sodium) .Marland Kitchen.. 1 every week 2)  Adult Aspirin Ec Low Strength 81 Mg Tbec (Aspirin) .... Take 1 tablet by mouth once a day 3)  Tylenol Arthritis Pain 650 Mg Tbcr (Acetaminophen) .... As needed 4)  Caltrate 600+d Plus 600-400 Mg-unit Tabs (Calcium carbonate-vit d-min) .... Take one by mouth daily 5)  Fluticasone Propionate 0.05 % Crea (Fluticasone propionate) .... Apply to affected area once daily as needed irritation 6)  Lorazepam  0.5 Mg Tabs (Lorazepam) .... Take one by mouth once daily as needed severe anxiety 7)  Flexeril 10 Mg Tabs (Cyclobenzaprine hcl) .... Take one half to 1 three times a day as needed 8)  Contour Brand Glucose Test Strips  .... To check glucose three times a day and as needed for poorly controlled dm2 250.0 9)  Centrum Silver Mvi  .Marland Kitchen.. 1 by mouth once daily 10)  Furosemide 40 Mg Tabs (Furosemide) .Marland Kitchen.. 1 by mouth once daily  is from dr brackbill 11)  Cinnamon 500 Mg Caps (Cinnamon) .... 1,000 mg. take 1 capsule by mouth two times a day 12)  Fish Oil Oil (Fish oil) .... Take 1 capsule by mouth two times a day 13)  Potassium 75 Mg Tabs (Potassium) .... Take 1 tablet by mouth once a day as needed 14)  L-lysine 500 Mg Tabs (Lysine) .... Take 1 tablet by mouth once a day 15)  Chest Congestion Relief 400 Mg Tabs (Guaifenesin) .... As needed 16)  Cozaar 25 Mg Tabs (Losartan potassium) .Marland Kitchen.. 1 by mouth once daily 17)  Tessalon 200 Mg Caps (Benzonatate) .Marland Kitchen.. 1 by mouth up to three times a day as needed cough 18)  Oxycodone-acetaminophen 10-325 Mg Tabs (Oxycodone-acetaminophen) .... One tablet by mouth every 6 huors as needed for pain. 19)  Temazepam 30 Mg Caps (Temazepam) .... One tablet by mouth at bedtime as needed for sleep. 20)  Flonase 50 Mcg/act Susp (Fluticasone propionate) .... 2 sprays in each nostril once daily  Patient Instructions: 1)  go to skim milk 2)  use high fiber items when you can  3)  follow diabetic diet 4)  try to stay active  5)  checking AIc for diabetes today 6)  please check your blood sugar in am fasting and then also 2 hours after afternoon or evening meal  7)  also keep a diet journal  8)  follow up in 4-6 weeks    Orders Added: 1)  Venipuncture [36415] 2)  TLB-A1C / Hgb A1C (Glycohemoglobin) [83036-A1C] 3)  Est. Patient Level IV [14782]    Current Allergies (reviewed today): ! VIOXX ! NSAIDS ! LIPITOR ! CORTISONE ! CRESTOR ! GLUCOPHAGE ! * VYTORIN !  CODEINE ! ZOCOR ! TORADOL ! SPIRONOLACTONE ! * TAPE (MEDICAL) ! [facility] ! DURAGESIC-100 ! ZYRTEC ALLERGY ! AMOXICILLIN ! AMOXICILLIN ! * HCTZ ! * LISINOPRIL ! PENICILLIN V POTASSIUM (PENICILLIN V POTASSIUM) ! KEFLEX ! PENICILLIN

## 2010-12-12 NOTE — Assessment & Plan Note (Signed)
Summary: F/U [place] ON 06/25/10/[facility]   Vital Signs:  Patient profile:   77 year old female Height:      64 inches Weight:      236 pounds BMI:     40.66 Temp:     98.4 degrees F oral Pulse rate:   80 / minute Pulse rhythm:   regular BP sitting:   132 / 74  (left arm) Cuff size:   large  Vitals Entered By: Lewanda Rife LPN (June 28, 2010 12:31 PM) CC: f/u from [facility] ER record  was on your shelf 06/26/10.   History of Present Illness: had to go to hosp for adv eff of keflex vitals stable and )2 sat nl   felt nauseated and dizzy and weak and headache  got dehydrated  did get sob  did go by EMS    started instead on clinda leg is improved -- but still is upset by the swelling - in calf  cbc normal   does not want to get ref to vascular yet   still feels kind of weak - but overall a lot better    Allergies: 1)  ! Vioxx 2)  ! Nsaids 3)  ! Lipitor 4)  ! Cortisone 5)  ! Crestor 6)  ! Glucophage 7)  ! * Vytorin 8)  ! Codeine 9)  ! Zocor 10)  ! Toradol 11)  ! Spironolactone 12)  ! * Tape (Medical) 13)  ! * Mucinex 14)  ! Duragesic-100 15)  ! Zyrtec Allergy 16)  ! Amoxicillin 17)  ! Amoxicillin 18)  ! * Hctz 19)  ! * Lisinopril 20)  ! Penicillin V Potassium (Penicillin V Potassium) 21)  ! Keflex  Past History:  Past Medical History: Last updated: 09/11/2009 Allergic rhinitis Diabetes mellitus, type II Hypertension Osteopenia Renal insufficiency Transient ischemic attack, hx of contact derm- buttocks ( uses steroid cream as needed)   cardilology---Dr Patty Sermons  Past Surgical History: Last updated: 02/26/2009 rotator cuff surgery right 8/08 Hysterectomy, 1 ovary- polyps (1973) Back surgery- dsk (03/1999) Kidney stone procedure (1999) Pyelonephritis Recurrent nosebleeds- cauterization Breast biopsy x 2- neg Dexa- osteopenia, hip (01/2000) MRI- chiari malformation (10/1997) Fall- rotator cuff tear (2004) MRI neck- thyroid nodule  (02/2004) Adenosine cardiolite- neg (08/2004) 2D Echo- mod LVH (05/2005) MRI brain- neg CVA, ethmoid sinusitis, stable chiari malf (08/2005) Left shoiulder surgery (2007) L-5 nerve root block (10/2006) 2D echo mild LVH with impaired relaxation and mild aortic sclerosis 4/09 (read as no sig change from 06) shoulder surgery  Family History: Last updated: 07/25/2008 Father: strokes Mother: strokes  Social History: Last updated: 04/26/2008 Marital Status: single Children: 3 Occupation: retired non smoker no alcohol  Risk Factors: Smoking Status: never (10/13/2007)  Review of Systems General:  Denies chills, fever, and loss of appetite. Eyes:  Denies blurring and eye irritation. CV:  Denies chest pain or discomfort and palpitations. Resp:  Denies cough, shortness of breath, and wheezing. GI:  Denies abdominal pain, bloody stools, and change in bowel habits. MS:  Complains of joint pain and stiffness. Derm:  Denies itching, lesion(s), poor wound healing, and rash. Heme:  Denies abnormal bruising and bleeding.  Physical Exam  General:  obese and well appearing  Head:  normocephalic, atraumatic, and no abnormalities observed.   Eyes:  vision grossly intact, pupils equal, pupils round, and pupils reactive to light.   Mouth:  pharynx pink and moist.  no swelling of mouth or throat  Neck:  supple with full rom and  no masses or thyromegally, no JVD or carotid bruit  Lungs:  Normal respiratory effort, chest expands symmetrically. Lungs are clear to auscultation, no crackles or wheezes. Heart:  Normal rate and regular rhythm. S1 and S2 normal without gallop, murmur, click, rub or other extra sounds. Msk:  poor rom LS and knees  Pulses:  R and L carotid,radial,femoral,dorsalis pedis and posterior tibial pulses are full and equal bilaterally Extremities:  venous insuff with tr-1plus edema bilat Neurologic:  sensation intact to light touch and DTRs symmetrical and normal.   Skin:  less  redness R leg- also less warm  some tenderness in calf    Cervical Nodes:  No lymphadenopathy noted Psych:  slightly anxious today   Impression & Recommendations:  Problem # 1:  LEG PAIN, RIGHT (ICD-729.5) Assessment Improved cellulitis is improved with course of clinda (after ER visit for intolerance to keflex) will finish that explained imp of elevation (and some walking ) of legs this weekend non tol of compression of any kind unfortunately again offered vascular clinic ref to eval venous insuff and opt for tx - pt will call if ready for that   Problem # 2:  ADVERSE DRUG REACTION (ICD-995.20) Assessment: New disc rxn to keflex- sounds like intolerance / not allergy no anaphalaxis/ hypoxia / swelling or reactive airways  nonetheless- will avoid med in future due to symptoms of headache/ nausea  Complete Medication List: 1)  Fosamax 70 Mg Tabs (Alendronate sodium) .Marland Kitchen.. 1 every week 2)  Adult Aspirin Ec Low Strength 81 Mg Tbec (Aspirin) .... Take 1 tablet by mouth once a day 3)  Tylenol Arthritis Pain 650 Mg Tbcr (Acetaminophen) .... As needed 4)  Caltrate 600+d Plus 600-400 Mg-unit Tabs (Calcium carbonate-vit d-min) .... Take one by mouth daily 5)  Fluticasone Propionate 0.05 % Crea (Fluticasone propionate) .... Apply to affected area once daily as needed irritation 6)  Lorazepam 0.5 Mg Tabs (Lorazepam) .... Take one by mouth once daily as needed severe anxiety 7)  Flexeril 10 Mg Tabs (Cyclobenzaprine hcl) .... Take one half to 1 three times a day as needed 8)  Contour Brand Glucose Test Strips  .... To check glucose three times a day and as needed for poorly controlled dm2 250.0 9)  Centrum Silver Mvi  .Marland Kitchen.. 1 by mouth once daily 10)  Hydrocodone-acetaminophen 5-325 Mg Tabs (Hydrocodone-acetaminophen) .... Take 1 tablet by mouth every 6 hours as needed for pain 11)  Furosemide 40 Mg Tabs (Furosemide) .Marland Kitchen.. 1 by mouth once daily  is from dr brackbill 12)  Cinnamon 500 Mg Caps  (Cinnamon) .... 1,000 mg. take 1 capsule by mouth two times a day 13)  Fish Oil Oil (Fish oil) .... Take 1 capsule by mouth two times a day 14)  Potassium 75 Mg Tabs (Potassium) .... Take 1 tablet by mouth once a day as needed 15)  L-lysine 500 Mg Tabs (Lysine) .... Take 1 tablet by mouth once a day 16)  Chest Congestion Relief 400 Mg Tabs (Guaifenesin) .... As needed 17)  Cozaar 25 Mg Tabs (Losartan potassium) .Marland Kitchen.. 1 by mouth once daily 18)  Tessalon 200 Mg Caps (Benzonatate) .Marland Kitchen.. 1 by mouth up to three times a day as needed cough 19)  Keflex 500 Mg Caps (Cephalexin) .Marland Kitchen.. 1 by mouth two times a day for 7 days 20)  Clindamycin Hcl 300 Mg Caps (Clindamycin hcl) .... One capsule by mouth twice a day for 7 days  Patient Instructions: 1)  keep legs as elevated at  level of heart if possible - whenever you can  2)  walking is good too 3)  avoid standing or sitting with legs down  4)  finish the clindamycin- infection looks better 5)  call us when you are ready for referral to a vascular office   Current Allergies (reviewed today): ! VIOXX ! NSAIDS ! LIPITOR ! CORTISONE ! CRESTOR ! GLUCOPHAGE ! * VYTORIN ! CODEINE ! ZOCOR ! TORADOL ! SPIRONOLACTONE ! * TAPE (MEDICAL) ! [facility] ! DURAGESIC-100 ! ZYRTEC ALLERGY ! AMOXICILLIN ! AMOXICILLIN ! * HCTZ ! * LISINOPRIL ! PENICILLIN V POTASSIUM (PENICILLIN V POTASSIUM) ! KEFLEX

## 2010-12-12 NOTE — Assessment & Plan Note (Signed)
Summary: 2 month follow up   Vital Signs:  Patient profile:   77 year old female Height:      64 inches Weight:      215.75 pounds BMI:     37.17 Temp:     98.2 degrees F oral Pulse rate:   72 / minute Pulse rhythm:   regular BP sitting:   128 / 84  (left arm) Cuff size:   large  Vitals Entered By: Lewanda Rife LPN (January 16, 1609 9:13 AM)  History of Present Illness: here for f/u of lipid/ DM and HTN  wt is down 23 lb today  is really working on it -watching what she eats  is hard to exercise at home - so walking at [employer]   eating smaller portions also diff things - more veg and salads  switched to diet drinks and water   is feeling pretty good in general   f/u with Dr Ethelene Hal yesterday  her daughter was concerned about memory problems (pt is not)  she is stressed over this    last eye exam 2 years -- needs that set up  sugar was 144 this am -- this is about her average     bp good at 128/84 no problems with that  last AIC in fall was 6.9  lipids high due to intol of all med with LDL 154 last time   renal insuff has improved and is drinking her water   Allergies: 1)  ! Vioxx 2)  ! Nsaids 3)  ! Lipitor 4)  ! Cortisone 5)  ! Crestor 6)  ! Glucophage 7)  ! * Vytorin 8)  ! Codeine 9)  ! Zocor 10)  ! Toradol 11)  ! Spironolactone 12)  ! * Tape (Medical) 13)  ! * Mucinex 14)  ! Duragesic-100 15)  ! Zyrtec Allergy 16)  ! Amoxicillin 17)  ! Amoxicillin 18)  ! * Hctz 19)  ! * Lisinopril  Review of Systems General:  Denies fatigue, fever, loss of appetite, and malaise. Eyes:  Denies blurring and eye pain. CV:  Denies chest pain or discomfort, lightheadness, and palpitations. Resp:  Denies cough and wheezing. GI:  Denies abdominal pain, bloody stools, change in bowel habits, gas, and indigestion. MS:  Complains of joint pain, muscle aches, and stiffness; denies joint redness, joint swelling, cramps, and muscle weakness. Derm:  Denies itching,  lesion(s), poor wound healing, and rash. Neuro:  Denies headaches, numbness, and tingling. Psych:  mood is fair . Endo:  Denies cold intolerance, excessive thirst, excessive urination, and heat intolerance.  Physical Exam  General:  obese but well appearing  (wt loss noted) Head:  normocephalic, atraumatic, and no abnormalities observed.   Eyes:  vision grossly intact, pupils equal, pupils round, and pupils reactive to light.  no conjunctival pallor, injection or icterus  Mouth:  pharynx pink and moist.   Neck:  supple with full rom and no masses or thyromegally, no JVD or carotid bruit  Chest Wall:  No deformities, masses, or tenderness noted. Lungs:  Normal respiratory effort, chest expands symmetrically. Lungs are clear to auscultation, no crackles or wheezes. Heart:  Normal rate and regular rhythm. S1 and S2 normal without gallop, murmur, click, rub or other extra sounds. Abdomen:  soft, non-tender, and normal bowel sounds.  no renal bruits  Msk:  No deformity or scoliosis noted of thoracic or lumbar spine.  poor rom of shoulders and knees  Pulses:  R and L carotid,radial,femoral,dorsalis  pedis and posterior tibial pulses are full and equal bilaterally Extremities:  trace pedal edema bilat - without pitting  some compressible varicosities  Neurologic:  sensation intact to light touch, gait normal, and DTRs symmetrical and normal.   Skin:  Intact without suspicious lesions or rashes Cervical Nodes:  No lymphadenopathy noted Psych:  normal affect, talkative and pleasant   Diabetes Management Exam:    Foot Exam (with socks and/or shoes not present):       Sensory-Pinprick/Light touch:          Left medial foot (L-4): normal          Left dorsal foot (L-5): normal          Left lateral foot (S-1): normal          Right medial foot (L-4): normal          Right dorsal foot (L-5): normal          Right lateral foot (S-1): normal       Sensory-Monofilament:          Left foot: normal           Right foot: normal       Inspection:          Left foot: normal          Right foot: normal       Nails:          Left foot: normal          Right foot: normal   Impression & Recommendations:  Problem # 1:  HYPERCHOLESTEROLEMIA (ICD-272.0) Assessment Unchanged  unable to get to goal due to her med intolerances - but hope for imp with better diet  lab today rev sat fats to avoid f/u 6 mo  Orders: Venipuncture ([address]) TLB-Lipid Panel (80061-LIPID) TLB-Renal Function Panel (80069-RENAL) TLB-ALT (SGPT) (84460-ALT) TLB-AST (SGOT) (84450-SGOT) TLB-A1C / Hgb A1C (Glycohemoglobin) (83036-A1C) Prescription Created Electronically [phone])  Labs Reviewed: SGOT: 23 (09/11/2009)   SGPT: 20 (09/11/2009)   HDL:40.80 (09/11/2009), 33.40 (02/26/2009)  LDL:139 (02/26/2009), DEL (07/25/2008)  Chol:206 (09/11/2009), 200 (02/26/2009)  Trig:131.0 (09/11/2009), 140.0 (02/26/2009)  Problem # 2:  OBESITY (ICD-278.00) Assessment: Improved commended on wt loss so far- keep working on it   Problem # 3:  RENAL INSUFFICIENCY (ICD-588.9) Assessment: Unchanged has normalized lately enc further good water intake  labs today Orders: Venipuncture ([address]) TLB-Lipid Panel (80061-LIPID) TLB-Renal Function Panel (80069-RENAL) TLB-ALT (SGPT) (84460-ALT) TLB-AST (SGOT) (84450-SGOT) TLB-A1C / Hgb A1C (Glycohemoglobin) (83036-A1C) Prescription Created Electronically (B1478)  Problem # 4:  HYPERTENSION (ICD-401.9) Assessment: Improved  bp is imp with wt loss commended on this  no change in med  lab today Her updated medication list for this problem includes:    Furosemide 40 Mg Tabs (Furosemide) .Marland Kitchen... 1 by mouth once daily  is from dr Patty Sermons    Cozaar 25 Mg Tabs (Losartan potassium) .Marland Kitchen... 1 by mouth once daily  Orders: Venipuncture ([address]) TLB-Lipid Panel (80061-LIPID) TLB-Renal Function Panel (80069-RENAL) TLB-ALT (SGPT) (84460-ALT) TLB-AST (SGOT) (84450-SGOT) TLB-A1C / Hgb A1C  (Glycohemoglobin) (83036-A1C) Prescription Created Electronically [phone])  BP today: 128/84 Prior BP: 134/90 (11/13/2009)  Labs Reviewed: K+: 4.4 (09/11/2009) Creat: : 1.1 (09/11/2009)   Chol: 206 (09/11/2009)   HDL: 40.80 (09/11/2009)   LDL: 139 (02/26/2009)   TG: 131.0 (09/11/2009)  Problem # 5:  DIABETES MELLITUS, TYPE II (ICD-250.00) Assessment: Unchanged  exp imp with AIC after wt loss sched opthy exam  continue asa and ace f/u 6 mo  Her updated medication list for this problem includes:    Adult Aspirin Ec Low Strength 81 Mg Tbec (Aspirin) .Marland Kitchen... Take 1 tablet by mouth once a day    Cozaar 25 Mg Tabs (Losartan potassium) .Marland Kitchen... 1 by mouth once daily  Orders: Ophthalmology Referral (Ophthalmology) Venipuncture [phone]) TLB-Lipid Panel (80061-LIPID) TLB-Renal Function Panel (80069-RENAL) TLB-ALT (SGPT) (84460-ALT) TLB-AST (SGOT) (84450-SGOT) TLB-A1C / Hgb A1C (Glycohemoglobin) (83036-A1C) Prescription Created Electronically [phone])  Labs Reviewed: Creat: 1.1 (09/11/2009)     Last Eye Exam: normal (11/10/2006) Reviewed HgBA1c results: 6.9 (09/11/2009)  6.6 (02/26/2009)  Complete Medication List: 1)  Fosamax 70 Mg Tabs (Alendronate sodium) .Marland Kitchen.. 1 every week 2)  Adult Aspirin Ec Low Strength 81 Mg Tbec (Aspirin) .... Take 1 tablet by mouth once a day 3)  Tylenol Arthritis Pain 650 Mg Tbcr (Acetaminophen) .... As needed 4)  Caltrate 600+d Plus 600-400 Mg-unit Tabs (Calcium carbonate-vit d-min) .... Take one by mouth daily 5)  Fluticasone Propionate 0.05 % Crea (Fluticasone propionate) .... Apply to affected area once daily as needed irritation 6)  Lorazepam 0.5 Mg Tabs (Lorazepam) .... Take one half to  1 twice a day as needed 7)  Flexeril 10 Mg Tabs (Cyclobenzaprine hcl) .... Take one half to 1 three times a day as needed 8)  Contour Brand Glucose Test Strips  .... To check glucose three times a day and as needed for poorly controlled dm2 250.0 9)  Centrum Silver Mvi   .Marland Kitchen.. 1 by mouth once daily 10)  Hydrocodone-acetaminophen 5-325 Mg Tabs (Hydrocodone-acetaminophen) .... Take 1 tablet by mouth every 6 hours as needed for pain 11)  Furosemide 40 Mg Tabs (Furosemide) .Marland Kitchen.. 1 by mouth once daily  is from dr brackbill 12)  Cinnamon 500 Mg Caps (Cinnamon) .... 1,000 mg. take 1 capsule by mouth two times a day 13)  Fish Oil Oil (Fish oil) .... Take 1 capsule by mouth two times a day 14)  Potassium 75 Mg Tabs (Potassium) .... Take 1 tablet by mouth once a day as needed 15)  L-lysine 500 Mg Tabs (Lysine) .... Take 1 tablet by mouth once a day 16)  Temazepam 30 Mg Caps (Temazepam) .... Take 1 tab by mouth at bedtime as needed 17)  Chest Congestion Relief 400 Mg Tabs (Guaifenesin) .... As needed 18)  Cozaar 25 Mg Tabs (Losartan potassium) .Marland Kitchen.. 1 by mouth once daily 19)  Tessalon 200 Mg Caps (Benzonatate) .Marland Kitchen.. 1 by mouth up to three times a day as needed cough  Patient Instructions: 1)  keep working on diet and exercise for weight loss - you are doing well  2)  we will do eye doctor appt at check out  3)  no change in medicines  4)  labs today  5)  schedule fasting lab in 6 months lipid/ast/alt/renal /AIC / microalbumin  250.0, 272 and then follow up  Prescriptions: COZAAR 25 MG TABS (LOSARTAN POTASSIUM) 1 by mouth once daily  #30 x 11   Entered and Authorized by:   Judith Part MD   Signed by:   Judith Part MD on 01/16/2010   Method used:   Electronically to        [facility]* (retail)       [address]       [place], [place]  [address]       Ph: [phone]       Fax: [phone]   RxID:   [id number]   Current Allergies (reviewed today): ! VIOXX !  NSAIDS ! LIPITOR ! CORTISONE ! CRESTOR ! GLUCOPHAGE ! * VYTORIN ! CODEINE ! ZOCOR ! TORADOL ! SPIRONOLACTONE ! * TAPE (MEDICAL) ! [facility] ! DURAGESIC-100 ! ZYRTEC ALLERGY ! AMOXICILLIN ! AMOXICILLIN ! * HCTZ ! * LISINOPRIL

## 2010-12-12 NOTE — Progress Notes (Signed)
Summary: Order for diabetes supplies  Phone Note From Other Clinic Call back at [phone] fax [phone]   Caller: [facility] Call For: Dr. Milinda Antis Summary of Call: Received fax form from [facility] requesting order for diabetes testing supplies.  Form in your IN box, please advise. Initial call taken by: Linde Gillis CMA Duncan Dull),  February 04, 2010 9:08 AM  Follow-up for Phone Call        could you please ask her how often she checks blood sugar - medicare may only pay for once per day ... - thanks, I will hold on to the form   Follow-up by: Judith Part MD,  February 04, 2010 10:18 AM  Additional Follow-up for Phone Call Additional follow up Details #1::        Pt occasionally will ck twice a day but usually does BS check once a day.Lewanda Rife LPN  February 04, 2010 10:33 AM   thanks -- form done and in nurse in box  Additional Follow-up by: Judith Part MD,  February 04, 2010 11:10 AM    Additional Follow-up for Phone Call Additional follow up Details #2::    Completed form faxed to [phone] as instructed.Lewanda Rife LPN  February 04, 2010 4:00 PM

## 2010-12-12 NOTE — Assessment & Plan Note (Signed)
Summary: BOTH FEET AND LEGS SWOLLEN AND BURN PER DR TOWER/RI   Vital Signs:  Patient profile:   77 year old female Weight:      235.38 pounds Temp:     98.8 degrees F oral Pulse rate:   76 / minute Pulse rhythm:   regular BP sitting:   124 / 74  (left arm) Cuff size:   large  Vitals Entered By: Sydell Axon LPN (July 08, 2010 12:10 PM) CC: Both feet and legs are swollen   History of Present Illness: 77 yo new to me here for no improvement of swelling and redness in both her legs, right > left.  emr reviewed.  venous dopplers earlier this month neg. started on doxy for cellulitis, no improvement, started keflex. went to ER for ?allergy to keflex, started on clinda.  5 days of clinda 300 mg bid left.  leg is improved -- but still is upset by the swelling in calf and feels her right lower leg is more red and painful to touch. cbc normal   no fever, chills, nausea or vomiting.  Has vascular appt scheduled for 9/19.   Current Medications (verified): 1)  Fosamax 70 Mg  Tabs (Alendronate Sodium) .Marland Kitchen.. 1 Every Week 2)  Adult Aspirin Ec Low Strength 81 Mg  Tbec (Aspirin) .... Take 1 Tablet By Mouth Once A Day 3)  Tylenol Arthritis Pain 650 Mg  Tbcr (Acetaminophen) .... As Needed 4)  Caltrate 600+d Plus 600-400 Mg-Unit Tabs (Calcium Carbonate-Vit D-Min) .... Take One By Mouth Daily 5)  Fluticasone Propionate 0.05 %  Crea (Fluticasone Propionate) .... Apply To Affected Area Once Daily As Needed Irritation 6)  Lorazepam 0.5 Mg  Tabs (Lorazepam) .... Take One By Mouth Once Daily As Needed Severe Anxiety 7)  Flexeril 10 Mg  Tabs (Cyclobenzaprine Hcl) .... Take One Half To 1 Three Times A Day As Needed 8)  Contour Brand Glucose Test Strips .... To Check Glucose Three Times A Day and As Needed For Poorly Controlled Dm2 250.0 9)  Centrum Silver Mvi .Marland Kitchen.. 1 By Mouth Once Daily 10)  Hydrocodone-Acetaminophen 5-325 Mg Tabs (Hydrocodone-Acetaminophen) .... Take 1 Tablet By Mouth Every 6 Hours  As Needed For Pain 11)  Furosemide 40 Mg Tabs (Furosemide) .Marland Kitchen.. 1 By Mouth Once Daily  Is From Dr Patty Sermons 12)  Cinnamon 500 Mg Caps (Cinnamon) .... 1,000 Mg. Take 1 Capsule By Mouth Two Times A Day 13)  Fish Oil   Oil (Fish Oil) .... Take 1 Capsule By Mouth Two Times A Day 14)  Potassium 75 Mg Tabs (Potassium) .... Take 1 Tablet By Mouth Once A Day As Needed 15)  L-Lysine 500 Mg Tabs (Lysine) .... Take 1 Tablet By Mouth Once A Day 16)  Chest Congestion Relief 400 Mg Tabs (Guaifenesin) .... As Needed 17)  Cozaar 25 Mg Tabs (Losartan Potassium) .Marland Kitchen.. 1 By Mouth Once Daily 18)  Tessalon 200 Mg Caps (Benzonatate) .Marland Kitchen.. 1 By Mouth Up To Three Times A Day As Needed Cough 19)  Keflex 500 Mg Caps (Cephalexin) .Marland Kitchen.. 1 By Mouth Two Times A Day For 7 Days 20)  Clindamycin Hcl 300 Mg Caps (Clindamycin Hcl) .... One Capsule By Mouth Twice A Day For 7 Days 21)  Cleocin 300 Mg Caps (Clindamycin Hcl) .... 2 Caps By Mouth Two Times A Day X 7 Days  Allergies: 1)  ! Vioxx 2)  ! Nsaids 3)  ! Lipitor 4)  ! Cortisone 5)  ! Crestor 6)  ! Glucophage 7)  ! *  Vytorin 8)  ! Codeine 9)  ! Zocor 10)  ! Toradol 11)  ! Spironolactone 12)  ! * Tape (Medical) 13)  ! * Mucinex 14)  ! Duragesic-100 15)  ! Zyrtec Allergy 16)  ! Amoxicillin 17)  ! Amoxicillin 18)  ! * Hctz 19)  ! * Lisinopril 20)  ! Penicillin V Potassium (Penicillin V Potassium) 21)  ! Keflex  Past History:  Past Medical History: Last updated: 09/11/2009 Allergic rhinitis Diabetes mellitus, type II Hypertension Osteopenia Renal insufficiency Transient ischemic attack, hx of contact derm- buttocks ( uses steroid cream as needed)   cardilology---Dr Patty Sermons  Past Surgical History: Last updated: 02/26/2009 rotator cuff surgery right 8/08 Hysterectomy, 1 ovary- polyps (1973) Back surgery- dsk (03/1999) Kidney stone procedure (1999) Pyelonephritis Recurrent nosebleeds- cauterization Breast biopsy x 2- neg Dexa- osteopenia, hip  (01/2000) MRI- chiari malformation (10/1997) Fall- rotator cuff tear (2004) MRI neck- thyroid nodule (02/2004) Adenosine cardiolite- neg (08/2004) 2D Echo- mod LVH (05/2005) MRI brain- neg CVA, ethmoid sinusitis, stable chiari malf (08/2005) Left shoiulder surgery (2007) L-5 nerve root block (10/2006) 2D echo mild LVH with impaired relaxation and mild aortic sclerosis 4/09 (read as no sig change from 06) shoulder surgery  Family History: Last updated: 07/25/2008 Father: strokes Mother: strokes  Social History: Last updated: 04/26/2008 Marital Status: single Children: 3 Occupation: retired non smoker no alcohol  Risk Factors: Smoking Status: never (10/13/2007)  Review of Systems      See HPI General:  Denies chills and fever. GI:  Denies abdominal pain, diarrhea, nausea, and vomiting.  Physical Exam  General:  obese and well appearing  VSS- afebrile and nontoxic appearing Msk:  poor rom LS and knees  Pulses:  R and L carotid,radial,femoral,dorsalis pedis and posterior tibial pulses are full and equal bilaterally Extremities:  venous insuff with tr-1plus edema bilat right shin mildly warm to touch, tender to palp Psych:  slightly anxious today   Impression & Recommendations:  Problem # 1:  VENOUS INSUFFICIENCY, LEGS (ICD-459.81) Assessment Unchanged  with ? cellulitis. likely more related to venous insufficiency rather than infectious cause but because she is noticing more redness, will increase dose of clinda. (has pcn allergy). marion was able to call and get her vascular appt moved up to later this week. red flags given requiring immediate follow up- worsening cellulitis or systemic symptoms.  Orders: Prescription Created Electronically [id number])  Complete Medication List: 1)  Fosamax 70 Mg Tabs (Alendronate sodium) .Marland Kitchen.. 1 every week 2)  Adult Aspirin Ec Low Strength 81 Mg Tbec (Aspirin) .... Take 1 tablet by mouth once a day 3)  Tylenol Arthritis Pain 650  Mg Tbcr (Acetaminophen) .... As needed 4)  Caltrate 600+d Plus 600-400 Mg-unit Tabs (Calcium carbonate-vit d-min) .... Take one by mouth daily 5)  Fluticasone Propionate 0.05 % Crea (Fluticasone propionate) .... Apply to affected area once daily as needed irritation 6)  Lorazepam 0.5 Mg Tabs (Lorazepam) .... Take one by mouth once daily as needed severe anxiety 7)  Flexeril 10 Mg Tabs (Cyclobenzaprine hcl) .... Take one half to 1 three times a day as needed 8)  Contour Brand Glucose Test Strips  .... To check glucose three times a day and as needed for poorly controlled dm2 250.0 9)  Centrum Silver Mvi  .Marland Kitchen.. 1 by mouth once daily 10)  Hydrocodone-acetaminophen 5-325 Mg Tabs (Hydrocodone-acetaminophen) .... Take 1 tablet by mouth every 6 hours as needed for pain 11)  Furosemide 40 Mg Tabs (Furosemide) .Marland Kitchen.. 1 by mouth once  daily  is from dr Patty Sermons 12)  Cinnamon 500 Mg Caps (Cinnamon) .... 1,000 mg. take 1 capsule by mouth two times a day 13)  Fish Oil Oil (Fish oil) .... Take 1 capsule by mouth two times a day 14)  Potassium 75 Mg Tabs (Potassium) .... Take 1 tablet by mouth once a day as needed 15)  L-lysine 500 Mg Tabs (Lysine) .... Take 1 tablet by mouth once a day 16)  Chest Congestion Relief 400 Mg Tabs (Guaifenesin) .... As needed 17)  Cozaar 25 Mg Tabs (Losartan potassium) .Marland Kitchen.. 1 by mouth once daily 18)  Tessalon 200 Mg Caps (Benzonatate) .Marland Kitchen.. 1 by mouth up to three times a day as needed cough 19)  Keflex 500 Mg Caps (Cephalexin) .Marland Kitchen.. 1 by mouth two times a day for 7 days 20)  Clindamycin Hcl 300 Mg Caps (Clindamycin hcl) .... One capsule by mouth twice a day for 7 days 21)  Cleocin 300 Mg Caps (Clindamycin hcl) .... 2 caps by mouth two times a day x 7 days Prescriptions: CLEOCIN 300 MG CAPS (CLINDAMYCIN HCL) 2 caps by mouth two times a day x 7 days  #28 x 0   Entered and Authorized by:   Ruthe Mannan MD   Signed by:   Ruthe Mannan MD on 07/08/2010   Method used:   Electronically to         [facility]* (retail)       [address]       [place], [place]  [address]       Ph: [phone]       Fax: [phone]   RxID:   [id number]   Current Allergies (reviewed today): ! VIOXX ! NSAIDS ! LIPITOR ! CORTISONE ! CRESTOR ! GLUCOPHAGE ! * VYTORIN ! CODEINE ! ZOCOR ! TORADOL ! SPIRONOLACTONE ! * TAPE (MEDICAL) ! [facility] ! DURAGESIC-100 ! ZYRTEC ALLERGY ! AMOXICILLIN ! AMOXICILLIN ! * HCTZ ! * LISINOPRIL ! PENICILLIN V POTASSIUM (PENICILLIN V POTASSIUM) ! KEFLEX

## 2010-12-12 NOTE — Miscellaneous (Signed)
   Clinical Lists Changes  Observations: Added new observation of MAMMO DUE: 04/30/2011 (04/29/2010 15:26) Added new observation of MAMMOGRAM: Normal (04/29/2010 15:26)

## 2010-12-12 NOTE — Letter (Signed)
Summary: [facility]   Imported By: Maryln Gottron 01/29/2010 13:46:51  _____________________________________________________________________  External Attachment:    Type:   Image     Comment:   External Document

## 2010-12-12 NOTE — Progress Notes (Signed)
Summary: pt is having knee surgery  Phone Note Call from Patient   Caller: Patient Call For: Judith Part MD Summary of Call: Pt wanted you to know that she had a nose bleed about a week or week and a half ago and blew out a clot.  Dr Patty Sermons told her that she needed to let you know about this.  He thinks it was tied into her sinus problems.   Also, she wants you to know that she is getting a right  knee replacement on thursday, by Dr Rennis Chris. Initial call taken by: Lowella Petties CMA, AAMA,  October 08, 2010 11:37 AM  Follow-up for Phone Call        the nasal spray I gave her can increase risk of nosebleeds please have her use it just every other day instead of daily  update me if no improvement  update me if other sinus symptoms please Follow-up by: Judith Part MD,  October 08, 2010 3:51 PM  Additional Follow-up for Phone Call Additional follow up Details #1::        Patient notified as instructed by telephone. Pt said only had nose bleed the one time about one week ago.Lewanda Rife LPN  October 08, 2010 4:20 PM

## 2010-12-12 NOTE — Progress Notes (Signed)
Summary: call from vascular center MD  Phone Note From Other Clinic   Caller: Physician from vascular center Summary of Call: Doctor from vascular center called to discuss his plan of treatment with Dr Milinda Antis or Dr. Dayton Martes, both are out of the office.  I offered to get another doctor to speak with him, he declined, said he would make his plan based on his exam of the pt and will send notes. Initial call taken by: Lowella Petties CMA,  July 11, 2010 4:33 PM

## 2010-12-12 NOTE — Assessment & Plan Note (Signed)
Summary: monday or tuesday follow up per md/rbh   Vital Signs:  Patient profile:   77 year old female Weight:      234.75 pounds Temp:     98.0 degrees F oral Pulse rate:   88 / minute Pulse rhythm:   regular BP sitting:   144 / 74  (left arm) Cuff size:   large  Vitals Entered By: Sydell Axon LPN (June 24, 2010 10:04 AM) CC: follow-up visit on swollen feet and legs   History of Present Illness: here for f/u visit for swollen and red leg (with bilat venous insuff)   was put on doxycycline for poss cellulitis/ phlebitis  she does not see any difference when her shoes are off  is a little painful and now muscle in upper leg is hurting too   cannot get support stockings on due to   had her doppler test  don't have result yet -- will be getting it soon    Allergies: 1)  ! Vioxx 2)  ! Nsaids 3)  ! Lipitor 4)  ! Cortisone 5)  ! Crestor 6)  ! Glucophage 7)  ! * Vytorin 8)  ! Codeine 9)  ! Zocor 10)  ! Toradol 11)  ! Spironolactone 12)  ! * Tape (Medical) 13)  ! * Mucinex 14)  ! Duragesic-100 15)  ! Zyrtec Allergy 16)  ! Amoxicillin 17)  ! Amoxicillin 18)  ! * Hctz 19)  ! * Lisinopril 20)  ! Penicillin V Potassium (Penicillin V Potassium)  Past History:  Past Medical History: Last updated: 09/11/2009 Allergic rhinitis Diabetes mellitus, type II Hypertension Osteopenia Renal insufficiency Transient ischemic attack, hx of contact derm- buttocks ( uses steroid cream as needed)   cardilology---Dr Patty Sermons  Past Surgical History: Last updated: 02/26/2009 rotator cuff surgery right 8/08 Hysterectomy, 1 ovary- polyps (1973) Back surgery- dsk (03/1999) Kidney stone procedure (1999) Pyelonephritis Recurrent nosebleeds- cauterization Breast biopsy x 2- neg Dexa- osteopenia, hip (01/2000) MRI- chiari malformation (10/1997) Fall- rotator cuff tear (2004) MRI neck- thyroid nodule (02/2004) Adenosine cardiolite- neg (08/2004) 2D Echo- mod LVH  (05/2005) MRI brain- neg CVA, ethmoid sinusitis, stable chiari malf (08/2005) Left shoiulder surgery (2007) L-5 nerve root block (10/2006) 2D echo mild LVH with impaired relaxation and mild aortic sclerosis 4/09 (read as no sig change from 06) shoulder surgery  Family History: Last updated: 07/25/2008 Father: strokes Mother: strokes  Social History: Last updated: 04/26/2008 Marital Status: single Children: 3 Occupation: retired non smoker no alcohol  Risk Factors: Smoking Status: never (10/13/2007)  Review of Systems General:  Denies chills, fatigue, fever, loss of appetite, and malaise. Eyes:  Denies blurring and eye pain. CV:  Denies chest pain or discomfort and lightheadness. Resp:  Denies cough, shortness of breath, and wheezing. GI:  Denies abdominal pain, change in bowel habits, nausea, and vomiting. GU:  Denies dysuria. MS:  Complains of joint pain; denies cramps and muscle weakness. Derm:  Complains of itching; denies rash. Neuro:  Denies numbness and tingling. Psych:  Denies anxiety and depression. Endo:  Denies cold intolerance, excessive thirst, excessive urination, and heat intolerance. Heme:  Denies abnormal bruising and bleeding.  Physical Exam  General:  obese and well appearing  Head:  normocephalic, atraumatic, and no abnormalities observed.   Eyes:  vision grossly intact, pupils equal, pupils round, and pupils reactive to light.   Neck:  supple with full rom and no masses or thyromegally, no JVD or carotid bruit  Lungs:  Normal respiratory effort,  chest expands symmetrically. Lungs are clear to auscultation, no crackles or wheezes. Heart:  Normal rate and regular rhythm. S1 and S2 normal without gallop, murmur, click, rub or other extra sounds. Msk:  see skin exam  Pulses:  R and L carotid,radial,femoral,dorsalis pedis and posterior tibial pulses are full and equal bilaterally Extremities:  no palp cords or homan sign Neurologic:  sensation intact to  light touch, gait normal, and DTRs symmetrical and normal.   Skin:  R leg - no change in swelling slt less red  warm to touch  calf is mildly tender  Inguinal Nodes:  No significant adenopathy Psych:  normal affect, talkative and pleasant    Impression & Recommendations:  Problem # 1:  LEG PAIN, RIGHT (ICD-729.5) Assessment Unchanged  with persistant redness (just a bit improved ) pt is frustrated about swelling still waiting on formal doppler report - per pt was negative  adv to elevate  cannot tol compression hose -- urged her to use ace bandage with gentle firmness and elevate and avoid salt  will change abx to keflex to see if more imp (disc poss cross rxn with amox) - will watch for that  if not imp mid week consider vascular ref for phlebitis and to investigate some opt for relief  Orders: Prescription Created Electronically [phone])  Complete Medication List: 1)  Fosamax 70 Mg Tabs (Alendronate sodium) .Marland Kitchen.. 1 every week 2)  Adult Aspirin Ec Low Strength 81 Mg Tbec (Aspirin) .... Take 1 tablet by mouth once a day 3)  Tylenol Arthritis Pain 650 Mg Tbcr (Acetaminophen) .... As needed 4)  Caltrate 600+d Plus 600-400 Mg-unit Tabs (Calcium carbonate-vit d-min) .... Take one by mouth daily 5)  Fluticasone Propionate 0.05 % Crea (Fluticasone propionate) .... Apply to affected area once daily as needed irritation 6)  Lorazepam 0.5 Mg Tabs (Lorazepam) .... Take one by mouth once daily as needed severe anxiety 7)  Flexeril 10 Mg Tabs (Cyclobenzaprine hcl) .... Take one half to 1 three times a day as needed 8)  Contour Brand Glucose Test Strips  .... To check glucose three times a day and as needed for poorly controlled dm2 250.0 9)  Centrum Silver Mvi  .Marland Kitchen.. 1 by mouth once daily 10)  Hydrocodone-acetaminophen 5-325 Mg Tabs (Hydrocodone-acetaminophen) .... Take 1 tablet by mouth every 6 hours as needed for pain 11)  Furosemide 40 Mg Tabs (Furosemide) .Marland Kitchen.. 1 by mouth once daily  is from  dr brackbill 12)  Cinnamon 500 Mg Caps (Cinnamon) .... 1,000 mg. take 1 capsule by mouth two times a day 13)  Fish Oil Oil (Fish oil) .... Take 1 capsule by mouth two times a day 14)  Potassium 75 Mg Tabs (Potassium) .... Take 1 tablet by mouth once a day as needed 15)  L-lysine 500 Mg Tabs (Lysine) .... Take 1 tablet by mouth once a day 16)  Chest Congestion Relief 400 Mg Tabs (Guaifenesin) .... As needed 17)  Cozaar 25 Mg Tabs (Losartan potassium) .Marland Kitchen.. 1 by mouth once daily 18)  Tessalon 200 Mg Caps (Benzonatate) .Marland Kitchen.. 1 by mouth up to three times a day as needed cough 19)  Keflex 500 Mg Caps (Cephalexin) .Marland Kitchen.. 1 by mouth two times a day for 7 days  Patient Instructions: 1)  stop the doxcycline  2)  start keflex (watch closely for side eff including rash) 3)  elevate legs  4)  wrap right let in ace bandange as firmly as you can stand it without pain 5)  call me mid week with an update -- and if not improving I will refer you to a vein center 6)  update me if worse or fever or more redness Prescriptions: KEFLEX 500 MG CAPS (CEPHALEXIN) 1 by mouth two times a day for 7 days  #14 x 0   Entered and Authorized by:   Judith Part MD   Signed by:   Judith Part MD on 06/24/2010   Method used:   Electronically to        [facility]* (retail)       [address]       [place], [place]  [address]       Ph: [phone]       Fax: [phone]   RxID:   [id number]   Current Allergies (reviewed today): ! VIOXX ! NSAIDS ! LIPITOR ! CORTISONE ! CRESTOR ! GLUCOPHAGE ! * VYTORIN ! CODEINE ! ZOCOR ! TORADOL ! SPIRONOLACTONE ! * TAPE (MEDICAL) ! [facility] ! DURAGESIC-100 ! ZYRTEC ALLERGY ! AMOXICILLIN ! AMOXICILLIN ! * HCTZ ! * LISINOPRIL ! PENICILLIN V POTASSIUM (PENICILLIN V POTASSIUM)

## 2010-12-12 NOTE — Letter (Signed)
Summary: Consuela Mimes MD  Consuela Mimes MD   Imported By: Lanelle Bal 10/24/2010 14:21:10  _____________________________________________________________________  External Attachment:    Type:   Image     Comment:   External Document

## 2010-12-12 NOTE — Assessment & Plan Note (Signed)
Summary: RIGHT LEG IS RED AND SWOLLEN   Vital Signs:  Patient profile:   77 year old female Height:      64 inches Weight:      229.75 pounds BMI:     39.58 Temp:     98 degrees F oral Pulse rate:   76 / minute Pulse rhythm:   regular BP sitting:   140 / 70  (left arm) Cuff size:   large  Vitals Entered By: Lewanda Rife LPN (June 19, 2010 10:18 AM) CC: right  lower leg swollen and red   History of Present Illness: r leg is swollen and more red than usual  at times is sore  is off and on -- usually goes away - but now a week or more - and really bothering her   no sob or chest pain no fever   no injury or bug or tick bite     Allergies: 1)  ! Vioxx 2)  ! Nsaids 3)  ! Lipitor 4)  ! Cortisone 5)  ! Crestor 6)  ! Glucophage 7)  ! * Vytorin 8)  ! Codeine 9)  ! Zocor 10)  ! Toradol 11)  ! Spironolactone 12)  ! * Tape (Medical) 13)  ! * Mucinex 14)  ! Duragesic-100 15)  ! Zyrtec Allergy 16)  ! Amoxicillin 17)  ! Amoxicillin 18)  ! * Hctz 19)  ! * Lisinopril  Past History:  Past Medical History: Last updated: 09/11/2009 Allergic rhinitis Diabetes mellitus, type II Hypertension Osteopenia Renal insufficiency Transient ischemic attack, hx of contact derm- buttocks ( uses steroid cream as needed)   cardilology---Dr Patty Sermons  Past Surgical History: Last updated: 02/26/2009 rotator cuff surgery right 8/08 Hysterectomy, 1 ovary- polyps (1973) Back surgery- dsk (03/1999) Kidney stone procedure (1999) Pyelonephritis Recurrent nosebleeds- cauterization Breast biopsy x 2- neg Dexa- osteopenia, hip (01/2000) MRI- chiari malformation (10/1997) Fall- rotator cuff tear (2004) MRI neck- thyroid nodule (02/2004) Adenosine cardiolite- neg (08/2004) 2D Echo- mod LVH (05/2005) MRI brain- neg CVA, ethmoid sinusitis, stable chiari malf (08/2005) Left shoiulder surgery (2007) L-5 nerve root block (10/2006) 2D echo mild LVH with impaired relaxation and mild  aortic sclerosis 4/09 (read as no sig change from 06) shoulder surgery  Family History: Last updated: 07/25/2008 Father: strokes Mother: strokes  Social History: Last updated: 04/26/2008 Marital Status: single Children: 3 Occupation: retired non smoker no alcohol  Risk Factors: Smoking Status: never (10/13/2007)  Review of Systems General:  Denies chills, fatigue, fever, loss of appetite, and malaise. Eyes:  Denies blurring. CV:  Denies chest pain or discomfort, palpitations, and shortness of breath with exertion. Resp:  Denies cough, shortness of breath, and wheezing. GI:  Denies abdominal pain. MS:  Denies cramps and muscle weakness. Derm:  Complains of rash; denies itching and poor wound healing. Neuro:  Denies numbness and tingling. Heme:  Denies abnormal bruising and bleeding.  Physical Exam  General:  obese and well appearing  Head:  normocephalic, atraumatic, and no abnormalities observed.   Eyes:  vision grossly intact, pupils equal, pupils round, and pupils reactive to light.   Mouth:  pharynx pink and moist.   Neck:  supple with full rom and no masses or thyromegally, no JVD or carotid bruit  Chest Wall:  No deformities, masses, or tenderness noted. Lungs:  Normal respiratory effort, chest expands symmetrically. Lungs are clear to auscultation, no crackles or wheezes. Heart:  Normal rate and regular rhythm. S1 and S2 normal without gallop, murmur, click,  rub or other extra sounds. Msk:  R leg  varicosities that are compressible below knee  tender/ erythema / swelling - lateral lower leg  some pitting  warm to the touch no open skin - but it is generally dry and flaking pt is able to bear wt  Pulses:  R and L carotid,radial,femoral,dorsalis pedis and posterior tibial pulses are full and equal bilaterally Extremities:  see above Neurologic:  sensation intact to light touch, gait normal, and DTRs symmetrical and normal.   Skin:  see msk exam Psych:  normal  affect, talkative and pleasant   Diabetes Management Exam:    Foot Exam (with socks and/or shoes not present):       Sensory-Pinprick/Light touch:          Left medial foot (L-4): normal          Left dorsal foot (L-5): normal          Left lateral foot (S-1): normal          Right medial foot (L-4): normal          Right dorsal foot (L-5): normal          Right lateral foot (S-1): normal       Sensory-Monofilament:          Left foot: normal          Right foot: normal       Inspection:          Left foot: normal          Right foot: normal       Nails:          Left foot: normal          Right foot: normal   Impression & Recommendations:  Problem # 1:  LEG PAIN, RIGHT (ICD-729.5) Assessment New leg pain with redness/ swelling/ venous insuff and tenderness (no skin interruption) diff includes phlebitis/ cellulitis and less likely dvt check doppler tx with doxycycline (pt is pcn intol) elevate/ compress/ warm compress re check next week call earlier if worse Orders: Doppler Referral (Doppler)  Complete Medication List: 1)  Fosamax 70 Mg Tabs (Alendronate sodium) .Marland Kitchen.. 1 every week 2)  Adult Aspirin Ec Low Strength 81 Mg Tbec (Aspirin) .... Take 1 tablet by mouth once a day 3)  Tylenol Arthritis Pain 650 Mg Tbcr (Acetaminophen) .... As needed 4)  Caltrate 600+d Plus 600-400 Mg-unit Tabs (Calcium carbonate-vit d-min) .... Take one by mouth daily 5)  Fluticasone Propionate 0.05 % Crea (Fluticasone propionate) .... Apply to affected area once daily as needed irritation 6)  Lorazepam 0.5 Mg Tabs (Lorazepam) .... Take one by mouth once daily as needed severe anxiety 7)  Flexeril 10 Mg Tabs (Cyclobenzaprine hcl) .... Take one half to 1 three times a day as needed 8)  Contour Brand Glucose Test Strips  .... To check glucose three times a day and as needed for poorly controlled dm2 250.0 9)  Centrum Silver Mvi  .Marland Kitchen.. 1 by mouth once daily 10)  Hydrocodone-acetaminophen 5-325 Mg  Tabs (Hydrocodone-acetaminophen) .... Take 1 tablet by mouth every 6 hours as needed for pain 11)  Furosemide 40 Mg Tabs (Furosemide) .Marland Kitchen.. 1 by mouth once daily  is from dr brackbill 12)  Cinnamon 500 Mg Caps (Cinnamon) .... 1,000 mg. take 1 capsule by mouth two times a day 13)  Fish Oil Oil (Fish oil) .... Take 1 capsule by mouth two times a day 14)  Potassium 75 Mg  Tabs (Potassium) .... Take 1 tablet by mouth once a day as needed 15)  L-lysine 500 Mg Tabs (Lysine) .... Take 1 tablet by mouth once a day 16)  Chest Congestion Relief 400 Mg Tabs (Guaifenesin) .... As needed 17)  Cozaar 25 Mg Tabs (Losartan potassium) .Marland Kitchen.. 1 by mouth once daily 18)  Tessalon 200 Mg Caps (Benzonatate) .Marland Kitchen.. 1 by mouth up to three times a day as needed cough 19)  Doxycycline Hyclate 100 Mg Caps (Doxycycline hyclate) .Marland Kitchen.. 1 by mouth two times a day for 1 week for leg infection  Patient Instructions: 1)  we will schedule leg doppler at check out  2)  elevate leg whenever possible 3)  wrap gently in ace bandage while up and around - to help swelling 4)  warm compresses are ok 5)  take the antibiotic - doxycycline- I sent to [facility]  6)  follow up with me on monday or tues for a recheck  7)  if worse - pain/ redness or any fever -- call asap Prescriptions: DOXYCYCLINE HYCLATE 100 MG CAPS (DOXYCYCLINE HYCLATE) 1 by mouth two times a day for 1 week for leg infection  #14 x 0   Entered and Authorized by:   Judith Part MD   Signed by:   Judith Part MD on 06/19/2010   Method used:   Electronically to        [facility]* (retail)       [address]       [place], [place]  [address]       Ph: [phone]       Fax: [phone]   RxID:   [id number]   Current Allergies (reviewed today): ! VIOXX ! NSAIDS ! LIPITOR ! CORTISONE ! CRESTOR ! GLUCOPHAGE ! * VYTORIN ! CODEINE ! ZOCOR ! TORADOL ! SPIRONOLACTONE ! * TAPE (MEDICAL) ! [facility] ! DURAGESIC-100 ! ZYRTEC ALLERGY ! AMOXICILLIN !  AMOXICILLIN ! * HCTZ ! * LISINOPRIL

## 2010-12-12 NOTE — Progress Notes (Signed)
Summary: Rx Lorazepam  Phone Note Refill Request Call back at [phone] Message from:  [facility] on May 16, 2010 1:06 PM  Refills Requested: Medication #1:  LORAZEPAM 0.5 MG  TABS take one half to  1 twice a day as needed   Last Refilled: 11/08/2009  Method Requested: Telephone to Pharmacy Initial call taken by: Sydell Axon LPN,  May 17, 4539 1:07 PM  Follow-up for Phone Call        I don't have a last refil and # on this -- was it from [place] or other doctor or [place]  (? why that is not in computer) please ask her how often she takes it and what for  just need more info before I can refil  Follow-up by: Judith Part MD,  May 16, 2010 3:07 PM  Additional Follow-up for Phone Call Additional follow up Details #1::        LR listed above 11/08/09; #60 per pharmacy.  Left message at patient's home to call back. Sydell Axon LPN  May 16, 9810 3:21 PM  Marcheta Grammes says pt got 60 on 11/08/09. Pt says she doesnt take this every day, only when her daughter has one of her spells and the pt gets very nervous with these spells. She says she only takes one a day, if she takes any at all.    Additional Follow-up by: Lowella Petties CMA,  May 16, 2010 4:29 PM    Additional Follow-up for Phone Call Additional follow up Details #2::    thanks px written on EMR for call in  Follow-up by: Judith Part MD,  May 17, 2010 7:55 AM  Additional Follow-up for Phone Call Additional follow up Details #3:: Details for Additional Follow-up Action Taken: Medication phoned to [facility] pharmacy as instructed. Lewanda Rife LPN  May 17, 9146 8:21 AM   New/Updated Medications: LORAZEPAM 0.5 MG  TABS (LORAZEPAM) take one by mouth once daily as needed severe anxiety Prescriptions: LORAZEPAM 0.5 MG  TABS (LORAZEPAM) take one by mouth once daily as needed severe anxiety  #30 x 0   Entered and Authorized by:   Judith Part MD   Signed by:   Lewanda Rife LPN on 82/95/6213   Method used:   Telephoned to ...  [facility]* (retail)       [address]       [place], [place]  [address]       Ph: [phone]       Fax: [phone]   RxID:   [id number]

## 2010-12-12 NOTE — Progress Notes (Signed)
Summary: Swollen feet and ankles  Phone Note Call from Patient Call back at [phone]   Caller: Patient Call For: Judith Part MD Summary of Call: Patient is calling to let you know that her feet and ankles are still swollen and she is out of Clindamycin HCL 300mg . Patient wants to know if you want to call more in for her?  [facility] Initial call taken by: Sydell Axon LPN,  July 02, 2010 11:46 AM  Follow-up for Phone Call        the clindamycin is for infection / redness/ pain -- but I think her swelling is from the venous insufficiency we disc in the office   how is the redness? - it was looking much better at her f/u  does she want ref to the vascular office as we discussed for the swelling? Follow-up by: Judith Part MD,  July 02, 2010 12:28 PM  Additional Follow-up for Phone Call Additional follow up Details #1::        Pt said both legs are getting red again with stinging and feels warm to the touch. pt wants to know what she should do. Swelling in leg is better but still swollen at ankles. Pt said she wuld do a vascular referral. Pt said a call back in AM was ok.Please advise. Lewanda Rife LPN  July 02, 2010 4:26 PM     Additional Follow-up for Phone Call Additional follow up Details #2::    we can extend the clindamycin again --px written on EMR for call in  I will do vascular ref for [facility] Follow-up by: Judith Part MD,  July 02, 2010 8:12 PM  Additional Follow-up for Phone Call Additional follow up Details #3:: Details for Additional Follow-up Action Taken: Patient notified as instructed by telephone. Medication phoned to [facility] pharmacy as instructed. Pt will wait tot hear from [facility] about vascular referral. Pt said she cannot remember vascular dr she saw before but his office is on [address] and she would like to see him if possible.Lewanda Rife LPN  July 03, 2010 9:20 AM   Prescriptions: CLINDAMYCIN HCL 300 MG CAPS (CLINDAMYCIN HCL) one  capsule by mouth twice a day for 7 days  #14 x 0   Entered and Authorized by:   Judith Part MD   Signed by:   Sydell Axon LPN on 45/40/9811   Method used:   Telephoned to ...       [facility]* (retail)       [address]       [place], [place]  [address]       Ph: [phone]       Fax: [phone]   RxID:   [id number]

## 2010-12-12 NOTE — Assessment & Plan Note (Signed)
Summary: COUGH   Vital Signs:  Patient profile:   77 year old female Height:      64 inches Weight:      237.25 pounds BMI:     40.87 Temp:     97.8 degrees F oral Pulse rate:   80 / minute Pulse rhythm:   regular BP sitting:   124 / 72  (left arm)  Vitals Entered By: Lewanda Rife LPN (Mar 29, 2010 11:39 AM) CC: productive cough with light yellow mucus, Sinus drainage that seems to bring on the cough   History of Present Illness: started getting sick about a week ago cough and post nasal drip  lot of mucous from lungs - is light  yellow  not getting better  face feels pressure and tightness but not pain   no n/v/d   ? fever  no chills or sweats   Allergies: 1)  ! Vioxx 2)  ! Nsaids 3)  ! Lipitor 4)  ! Cortisone 5)  ! Crestor 6)  ! Glucophage 7)  ! * Vytorin 8)  ! Codeine 9)  ! Zocor 10)  ! Toradol 11)  ! Spironolactone 12)  ! * Tape (Medical) 13)  ! * Mucinex 14)  ! Duragesic-100 15)  ! Zyrtec Allergy 16)  ! Amoxicillin 17)  ! Amoxicillin 18)  ! * Hctz 19)  ! * Lisinopril  Past History:  Past Medical History: Last updated: 09/11/2009 Allergic rhinitis Diabetes mellitus, type II Hypertension Osteopenia Renal insufficiency Transient ischemic attack, hx of contact derm- buttocks ( uses steroid cream as needed)   cardilology---Dr Patty Sermons  Past Surgical History: Last updated: 02/26/2009 rotator cuff surgery right 8/08 Hysterectomy, 1 ovary- polyps (1973) Back surgery- dsk (03/1999) Kidney stone procedure (1999) Pyelonephritis Recurrent nosebleeds- cauterization Breast biopsy x 2- neg Dexa- osteopenia, hip (01/2000) MRI- chiari malformation (10/1997) Fall- rotator cuff tear (2004) MRI neck- thyroid nodule (02/2004) Adenosine cardiolite- neg (08/2004) 2D Echo- mod LVH (05/2005) MRI brain- neg CVA, ethmoid sinusitis, stable chiari malf (08/2005) Left shoiulder surgery (2007) L-5 nerve root block (10/2006) 2D echo mild LVH with impaired  relaxation and mild aortic sclerosis 4/09 (read as no sig change from 06) shoulder surgery  Family History: Last updated: 07/25/2008 Father: strokes Mother: strokes  Social History: Last updated: 04/26/2008 Marital Status: single Children: 3 Occupation: retired non smoker no alcohol  Risk Factors: Smoking Status: never (10/13/2007)  Review of Systems General:  Complains of chills, fatigue, fever, loss of appetite, and malaise. Eyes:  Denies discharge and eye irritation. ENT:  Complains of nasal congestion, postnasal drainage, sinus pressure, and sore throat; denies earache. CV:  Denies chest pain or discomfort and palpitations. Resp:  Complains of cough and sputum productive; denies pleuritic and shortness of breath. GI:  Denies diarrhea, nausea, and vomiting. Derm:  Denies lesion(s) and rash.  Physical Exam  General:  obese and well appearing  Head:  normocephalic, atraumatic, and no abnormalities observed.  no sinus tenderness  Eyes:  vision grossly intact, pupils equal, pupils round, and pupils reactive to light.   Ears:  R ear normal and L ear normal.   Nose:  nares are partially congested and boggy Mouth:  pharynx pink and moist, no erythema, and no exudates.   Neck:  supple with full rom and no masses or thyromegally, no JVD or carotid bruit  Lungs:  CTA with harsh and somewhat distant bs at bases no rales or rhonchi scant wheeze only on forced exp  Heart:  Normal rate  and regular rhythm. S1 and S2 normal without gallop, murmur, click, rub or other extra sounds. Skin:  Intact without suspicious lesions or rashes Cervical Nodes:  No lymphadenopathy noted Psych:  normal affect, talkative and pleasant    Impression & Recommendations:  Problem # 1:  BRONCHITIS- ACUTE (ICD-466.0) Assessment New mild bronchitis after uri that is not imp after a week  will tx with zithromax and update  expectorant as tolerated pt advised to update me if symptoms worsen or do not  improve -- .esp if sob or fever Her updated medication list for this problem includes:    Chest Congestion Relief 400 Mg Tabs (Guaifenesin) .Marland Kitchen... As needed    Tessalon 200 Mg Caps (Benzonatate) .Marland Kitchen... 1 by mouth up to three times a day as needed cough    Zithromax Z-pak 250 Mg Tabs (Azithromycin) .Marland Kitchen... Take by mouth as directed  Complete Medication List: 1)  Fosamax 70 Mg Tabs (Alendronate sodium) .Marland Kitchen.. 1 every week 2)  Adult Aspirin Ec Low Strength 81 Mg Tbec (Aspirin) .... Take 1 tablet by mouth once a day 3)  Tylenol Arthritis Pain 650 Mg Tbcr (Acetaminophen) .... As needed 4)  Caltrate 600+d Plus 600-400 Mg-unit Tabs (Calcium carbonate-vit d-min) .... Take one by mouth daily 5)  Fluticasone Propionate 0.05 % Crea (Fluticasone propionate) .... Apply to affected area once daily as needed irritation 6)  Lorazepam 0.5 Mg Tabs (Lorazepam) .... Take one half to  1 twice a day as needed 7)  Flexeril 10 Mg Tabs (Cyclobenzaprine hcl) .... Take one half to 1 three times a day as needed 8)  Contour Brand Glucose Test Strips  .... To check glucose three times a day and as needed for poorly controlled dm2 250.0 9)  Centrum Silver Mvi  .Marland Kitchen.. 1 by mouth once daily 10)  Hydrocodone-acetaminophen 5-325 Mg Tabs (Hydrocodone-acetaminophen) .... Take 1 tablet by mouth every 6 hours as needed for pain 11)  Furosemide 40 Mg Tabs (Furosemide) .Marland Kitchen.. 1 by mouth once daily  is from dr brackbill 12)  Cinnamon 500 Mg Caps (Cinnamon) .... 1,000 mg. take 1 capsule by mouth two times a day 13)  Fish Oil Oil (Fish oil) .... Take 1 capsule by mouth two times a day 14)  Potassium 75 Mg Tabs (Potassium) .... Take 1 tablet by mouth once a day as needed 15)  L-lysine 500 Mg Tabs (Lysine) .... Take 1 tablet by mouth once a day 16)  Temazepam 30 Mg Caps (Temazepam) .... Take 1 tab by mouth at bedtime as needed 17)  Chest Congestion Relief 400 Mg Tabs (Guaifenesin) .... As needed 18)  Cozaar 25 Mg Tabs (Losartan potassium) .Marland Kitchen.. 1  by mouth once daily 19)  Tessalon 200 Mg Caps (Benzonatate) .Marland Kitchen.. 1 by mouth up to three times a day as needed cough 20)  Zithromax Z-pak 250 Mg Tabs (Azithromycin) .... Take by mouth as directed  Patient Instructions: 1)  I think you have a chest cold with a bit of bronchitis 2)  drink lots of fluids  3)  take the zithromax for antibiotic -- I sent the px to [facility]  4)  take some robitussin if you do not have side effects from it  5)  try to get some rest  6)  update me if not improved next week  Prescriptions: ZITHROMAX Z-PAK 250 MG TABS (AZITHROMYCIN) take by mouth as directed  #1 pack x 0   Entered and Authorized by:   Judith Part MD   Signed  by:   Judith Part MD on 03/29/2010   Method used:   Electronically to        [facility]* (retail)       [address]       [place], [place]  [address]       Ph: [phone]       Fax: [phone]   RxID:   [id number]   Current Allergies (reviewed today): ! VIOXX ! NSAIDS ! LIPITOR ! CORTISONE ! CRESTOR ! GLUCOPHAGE ! * VYTORIN ! CODEINE ! ZOCOR ! TORADOL ! SPIRONOLACTONE ! * TAPE (MEDICAL) ! [facility] ! DURAGESIC-100 ! ZYRTEC ALLERGY ! AMOXICILLIN ! AMOXICILLIN ! * HCTZ ! * LISINOPRIL

## 2010-12-12 NOTE — Progress Notes (Signed)
Summary: not any better  Phone Note Call from Patient Call back at Home Phone [phone]   Caller: Patient Call For: Judith Part MD Summary of Call: Pt states she has 5 clindomycin left-  both feet, ankles and legs are swollen and burning.  She has appt with vascular people on 9/19, but she doesnt think she can wait that long, her condition is getting worse.  She has splotches on both legs, left worse than right. Uses [facility]. Initial call taken by: Lowella Petties CMA,  July 08, 2010 10:16 AM  Follow-up for Phone Call        she needs to be seen- please schedule f/u (if I am full- first availible provider please)  Follow-up by: Judith Part MD,  July 08, 2010 10:29 AM  Additional Follow-up for Phone Call Additional follow up Details #1::        Patient notified as instructed by telephone. Spoke with Dr Dayton Martes and she said it was OK to add to her schedule at 12:15pm.Rena [facility] LPN  July 08, 2010 11:09 AM

## 2010-12-12 NOTE — Progress Notes (Signed)
Summary: feeling better  Phone Note Call from Patient Call back at Home Phone [phone]   Caller: Patient Call For: Judith Part MD Summary of Call: Patient says that she is feeling much better and so is the coughing. She says that she still has a little bit of dranage, but is much better than it was. She wants to know if she still needs to schedule appointment to see you. Please advise.  Initial call taken by: Melody Comas,  November 19, 2009 3:23 PM  Follow-up for Phone Call        not for cough-- unless it worsens again  I'm glad she is doing better   do f/u in 2 mo for blood pressure visit  Follow-up by: Judith Part MD,  November 19, 2009 3:32 PM  Additional Follow-up for Phone Call Additional follow up Details #1::        Advised pt, appt made. Additional Follow-up by: Lowella Petties CMA,  November 19, 2009 4:25 PM

## 2010-12-12 NOTE — Medication Information (Signed)
Summary: [facility]  [facility]   Imported By: Lanelle Bal 02/07/2010 14:09:39  _____________________________________________________________________  External Attachment:    Type:   Image     Comment:   External Document

## 2010-12-12 NOTE — Assessment & Plan Note (Signed)
Summary: 6 month follow up/ alc   Vital Signs:  Patient profile:   77 year old female Height:      64 inches Weight:      231.25 pounds BMI:     39.84 Temp:     97.9 degrees F oral Pulse rate:   84 / minute Pulse rhythm:   regular BP sitting:   118 / 78  (left arm) Cuff size:   large  Vitals Entered By: Lewanda Rife LPN (July 29, 2010 9:38 AM) CC: six month f/u and sinus   History of Present Illness: here for f/u of multiple chronic problems   wt is down 3 lb  bp is 118/78- good control   DM - last AIC 6.7 six mo ago  venous insuff they will work on her veins -- phlebitis in R leg and has had 2 visits  has some hose they gave her some hose- the 2nd pair was more comfortable (grandchildren help her get them on)  is on her feet more  let still feel hot  feels like her kneecap bothers her after accident in hospital  goes back in 2 weeks finished her antibiotics   sinus problems  this is constant with drainage in head and throat  is cloudy without color  no fever  some pain over her L eyebrow for over a month   some cough also   is not using flonase nasal spray   chol - intol meds - last LDL in 120s, however   Allergies: 1)  ! Vioxx 2)  ! Nsaids 3)  ! Lipitor 4)  ! Cortisone 5)  ! Crestor 6)  ! Glucophage 7)  ! * Vytorin 8)  ! Codeine 9)  ! Zocor 10)  ! Toradol 11)  ! Spironolactone 12)  ! * Tape (Medical) 13)  ! * Mucinex 14)  ! Duragesic-100 15)  ! Zyrtec Allergy 16)  ! Amoxicillin 17)  ! Amoxicillin 18)  ! * Hctz 19)  ! * Lisinopril 20)  ! Penicillin V Potassium (Penicillin V Potassium) 21)  ! Keflex 22)  ! Penicillin  Past History:  Past Medical History: Last updated: 09/11/2009 Allergic rhinitis Diabetes mellitus, type II Hypertension Osteopenia Renal insufficiency Transient ischemic attack, hx of contact derm- buttocks ( uses steroid cream as needed)   cardilology---Dr Patty Sermons  Past Surgical History: Last updated:  02/26/2009 rotator cuff surgery right 8/08 Hysterectomy, 1 ovary- polyps (1973) Back surgery- dsk (03/1999) Kidney stone procedure (1999) Pyelonephritis Recurrent nosebleeds- cauterization Breast biopsy x 2- neg Dexa- osteopenia, hip (01/2000) MRI- chiari malformation (10/1997) Fall- rotator cuff tear (2004) MRI neck- thyroid nodule (02/2004) Adenosine cardiolite- neg (08/2004) 2D Echo- mod LVH (05/2005) MRI brain- neg CVA, ethmoid sinusitis, stable chiari malf (08/2005) Left shoiulder surgery (2007) L-5 nerve root block (10/2006) 2D echo mild LVH with impaired relaxation and mild aortic sclerosis 4/09 (read as no sig change from 06) shoulder surgery  Family History: Last updated: 07/25/2008 Father: strokes Mother: strokes  Social History: Last updated: 04/26/2008 Marital Status: single Children: 3 Occupation: retired non smoker no alcohol  Risk Factors: Smoking Status: never (10/13/2007)  Review of Systems General:  Complains of fatigue; denies chills, fever, loss of appetite, and malaise. Eyes:  Denies blurring, discharge, and eye irritation. ENT:  Complains of hoarseness, nasal congestion, postnasal drainage, and sinus pressure; denies earache. CV:  Denies chest pain or discomfort and palpitations. Resp:  Complains of cough; denies pleuritic, shortness of breath, sputum productive, and wheezing. GI:  Denies nausea. Derm:  Denies itching, lesion(s), poor wound healing, and rash. Neuro:  Denies numbness and tingling. Endo:  Denies cold intolerance and heat intolerance. Heme:  Denies abnormal bruising and bleeding.  Physical Exam  General:  overweight but generally well appearing  Head:  normocephalic, atraumatic, and no abnormalities observed.   Eyes:  vision grossly intact, pupils equal, pupils round, and pupils reactive to light.  no conjunctival pallor, injection or icterus  Mouth:  pharynx pink and moist.   Neck:  supple with full rom and no masses or  thyromegally, no JVD or carotid bruit  Chest Wall:  No deformities, masses, or tenderness noted. Lungs:  Normal respiratory effort, chest expands symmetrically. Lungs are clear to auscultation, no crackles or wheezes. Heart:  Normal rate and regular rhythm. S1 and S2 normal without gallop, murmur, click, rub or other extra sounds. Abdomen:  soft, non-tender, and normal bowel sounds.  no renal bruits  Msk:  poor rom LS and knees  Pulses:  R and L carotid,radial,femoral,dorsalis pedis and posterior tibial pulses are full and equal bilaterally Extremities:  edema much improved Neurologic:  sensation intact to light touch and DTRs symmetrical and normal.   Skin:  improved skin on legs - less redness and warmth Cervical Nodes:  No lymphadenopathy noted Inguinal Nodes:  No significant adenopathy Psych:  normal affect, talkative and pleasant   Diabetes Management Exam:    Foot Exam (with socks and/or shoes not present):       Sensory-Pinprick/Light touch:          Left medial foot (L-4): normal          Left dorsal foot (L-5): normal          Left lateral foot (S-1): normal          Right medial foot (L-4): normal          Right dorsal foot (L-5): normal          Right lateral foot (S-1): normal       Sensory-Monofilament:          Left foot: normal          Right foot: normal       Inspection:          Left foot: normal          Right foot: normal       Nails:          Left foot: normal          Right foot: normal   Impression & Recommendations:  Problem # 1:  VENOUS INSUFFICIENCY, LEGS (ICD-459.81) Assessment Improved this and edema are imp with supp hose/ compression  is now off abx for vasc f/u in 2 weeks   Problem # 2:  GOITER, MULTINODULAR (ICD-241.1) Assessment: Unchanged no changes lab today for tsh Orders: TLB-TSH (Thyroid Stimulating Hormone) (84443-TSH)  Problem # 3:  RENAL INSUFFICIENCY (ICD-588.9) Assessment: Unchanged had imp with water intake lab  today Orders: Venipuncture (03474) TLB-Lipid Panel (80061-LIPID) TLB-Renal Function Panel (80069-RENAL) TLB-A1C / Hgb A1C (Glycohemoglobin) (83036-A1C) TLB-TSH (Thyroid Stimulating Hormone) (84443-TSH)  Problem # 4:  HYPERTENSION (ICD-401.9) Assessment: Unchanged  very good control without change lab today f/u 6 mo  enc to be active Her updated medication list for this problem includes:    Furosemide 40 Mg Tabs (Furosemide) .Marland Kitchen... 1 by mouth once daily  is from dr Patty Sermons    Cozaar 25 Mg Tabs (Losartan potassium) .Marland Kitchen... 1 by mouth once  daily  Orders: Venipuncture ([address]109) TLB-Lipid Panel (80061-LIPID) TLB-Renal Function Panel (80069-RENAL) TLB-A1C / Hgb A1C (Glycohemoglobin) (83036-A1C) TLB-TSH (Thyroid Stimulating Hormone) (84443-TSH)  BP today: 118/78 Prior BP: 124/74 (07/08/2010)  Labs Reviewed: K+: 5.0 (01/16/2010) Creat: : 1.1 (01/16/2010)   Chol: 195 (01/16/2010)   HDL: 44.10 (01/16/2010)   LDL: 126 (01/16/2010)   TG: 126.0 (01/16/2010)  Problem # 5:  HYPERCHOLESTEROLEMIA (ICD-272.0) Assessment: Unchanged  labs - intol to meds disc low sat fat diet  Orders: Venipuncture (60454) TLB-Lipid Panel (80061-LIPID) TLB-Renal Function Panel (80069-RENAL) TLB-A1C / Hgb A1C (Glycohemoglobin) (83036-A1C) TLB-TSH (Thyroid Stimulating Hormone) (84443-TSH)  Labs Reviewed: SGOT: 20 (01/16/2010)   SGPT: 17 (01/16/2010)   HDL:44.10 (01/16/2010), 40.80 (09/11/2009)  LDL:126 (01/16/2010), 139 (02/26/2009)  Chol:195 (01/16/2010), 206 (09/11/2009)  Trig:126.0 (01/16/2010), 131.0 (09/11/2009)  Problem # 6:  DIABETES MELLITUS, TYPE II (ICD-250.00) Assessment: Unchanged  lab today  last AIC 6.7 disc diet - no meds currently  flu shot today Her updated medication list for this problem includes:    Adult Aspirin Ec Low Strength 81 Mg Tbec (Aspirin) .Marland Kitchen... Take 1 tablet by mouth once a day    Cozaar 25 Mg Tabs (Losartan potassium) .Marland Kitchen... 1 by mouth once daily  Orders: Venipuncture  (09811) TLB-Lipid Panel (80061-LIPID) TLB-Renal Function Panel (80069-RENAL) TLB-A1C / Hgb A1C (Glycohemoglobin) (83036-A1C) TLB-TSH (Thyroid Stimulating Hormone) (84443-TSH)  Labs Reviewed: Creat: 1.1 (01/16/2010)     Last Eye Exam: normal (11/10/2006) Reviewed HgBA1c results: 6.7 (01/16/2010)  6.9 (09/11/2009)  Complete Medication List: 1)  Fosamax 70 Mg Tabs (Alendronate sodium) .Marland Kitchen.. 1 every week 2)  Adult Aspirin Ec Low Strength 81 Mg Tbec (Aspirin) .... Take 1 tablet by mouth once a day 3)  Tylenol Arthritis Pain 650 Mg Tbcr (Acetaminophen) .... As needed 4)  Caltrate 600+d Plus 600-400 Mg-unit Tabs (Calcium carbonate-vit d-min) .... Take one by mouth daily 5)  Fluticasone Propionate 0.05 % Crea (Fluticasone propionate) .... Apply to affected area once daily as needed irritation 6)  Lorazepam 0.5 Mg Tabs (Lorazepam) .... Take one by mouth once daily as needed severe anxiety 7)  Flexeril 10 Mg Tabs (Cyclobenzaprine hcl) .... Take one half to 1 three times a day as needed 8)  Contour Brand Glucose Test Strips  .... To check glucose three times a day and as needed for poorly controlled dm2 250.0 9)  Centrum Silver Mvi  .Marland Kitchen.. 1 by mouth once daily 10)  Furosemide 40 Mg Tabs (Furosemide) .Marland Kitchen.. 1 by mouth once daily  is from dr brackbill 11)  Cinnamon 500 Mg Caps (Cinnamon) .... 1,000 mg. take 1 capsule by mouth two times a day 12)  Fish Oil Oil (Fish oil) .... Take 1 capsule by mouth two times a day 13)  Potassium 75 Mg Tabs (Potassium) .... Take 1 tablet by mouth once a day as needed 14)  L-lysine 500 Mg Tabs (Lysine) .... Take 1 tablet by mouth once a day 15)  Chest Congestion Relief 400 Mg Tabs (Guaifenesin) .... As needed 16)  Cozaar 25 Mg Tabs (Losartan potassium) .Marland Kitchen.. 1 by mouth once daily 17)  Tessalon 200 Mg Caps (Benzonatate) .Marland Kitchen.. 1 by mouth up to three times a day as needed cough 18)  Cleocin 300 Mg Caps (Clindamycin hcl) .... 2 caps by mouth two times a day x 7 days 19)   Oxycodone-acetaminophen 10-325 Mg Tabs (Oxycodone-acetaminophen) .... One tablet by mouth every 6 huors as needed for pain. 20)  Temazepam 30 Mg Caps (Temazepam) .... One tablet by mouth at bedtime  as needed for sleep. 21)  Flonase 50 Mcg/act Susp (Fluticasone propionate) .... 2 sprays in each nostril once daily  Other Orders: Flu Vaccine 74yrs + MEDICARE PATIENTS (U7253) Administration Flu vaccine - MCR (G6440) Prescription Created Electronically [phone])  Patient Instructions: 1)  labs today  2)  keep wearing support hose whenever you are not sleeping  3)  please start back on your flonase (fluticasone)  nasal spray for sinus problems and facial pain-- and update me next week if not improved 4)  follow up in about 6 months  Prescriptions: FLONASE 50 MCG/ACT SUSP (FLUTICASONE PROPIONATE) 2 sprays in each nostril once daily  #1 mdi x 11   Entered and Authorized by:   Judith Part MD   Signed by:   Judith Part MD on 07/29/2010   Method used:   Electronically to        [facility]* (retail)       [address]       [place], [place]  [address]       Ph: [phone]       Fax: [phone]   RxID:   [id number]   Current Allergies (reviewed today): ! VIOXX ! NSAIDS ! LIPITOR ! CORTISONE ! CRESTOR ! GLUCOPHAGE ! * VYTORIN ! CODEINE ! ZOCOR ! TORADOL ! SPIRONOLACTONE ! * TAPE (MEDICAL) ! [facility] ! DURAGESIC-100 ! ZYRTEC ALLERGY ! AMOXICILLIN ! AMOXICILLIN ! * HCTZ ! * LISINOPRIL ! PENICILLIN V POTASSIUM (PENICILLIN V POTASSIUM) ! KEFLEX ! PENICILLIN    Flu Vaccine Consent Questions     Do you have a history of severe allergic reactions to this vaccine? no    Any prior history of allergic reactions to egg and/or gelatin? no    Do you have a sensitivity to the preservative Thimersol? no    Do you have a past history of Guillan-Barre Syndrome? no    Do you currently have an acute febrile illness? no    Have you ever had a severe reaction to latex? no     Vaccine information given and explained to patient? yes    Are you currently pregnant? no    Lot Number:[id number]   Exp Date:05/10/2011   Site Given  Left Deltoid IMedflu Lewanda Rife LPN  July 29, 2010 10:25 AM

## 2010-12-12 NOTE — Assessment & Plan Note (Signed)
Summary: COUGH   Vital Signs:  Patient profile:   77 year old female Weight:      238 pounds BMI:     41.00 Temp:     97.7 degrees F oral Pulse rate:   80 / minute Pulse rhythm:   regular BP sitting:   134 / 90  (left arm) Cuff size:   regular  Vitals Entered By: Lowella Petties CMA (November 13, 2009 4:12 PM)  Serial Vital Signs/Assessments:  Time      Position  BP       Pulse  Resp  Temp     By                     130/80                         Judith Part MD  CC: Cough   History of Present Illness: here for cough  has been coughing since june - when she was treated for sinus infx with amox  never fully finished it - because it made her legs feel funny   thinks she still has a sinus infection--never got fully better  nose drips all day-- and bleeds some  d/c is light colored thick mucous  forehead and face are sore and numb feeling  feels tired out in general  does not think she has a fever-- but has had a few episodes of face flushing  cough was productive - now is dry and hacking , with irritation in her throat (but not really sore )  not wheezing - but is generally hoarse  tessalon lasts 2-3 hours and then stops working   has splotches on her legs -- last night when she took her shower  not itching at all   bp is 134/90 no problems with her bp med    at first thought it was from lisinopril/ ace so changed to cozaar   she is stressed out by her daughter with severe fibromyalgia    Allergies: 1)  ! Vioxx 2)  ! Nsaids 3)  ! Lipitor 4)  ! Cortisone 5)  ! Crestor 6)  ! Glucophage 7)  ! * Vytorin 8)  ! Codeine 9)  ! Zocor 10)  ! Toradol 11)  ! Spironolactone 12)  ! * Tape (Medical) 13)  ! * Mucinex 14)  ! Duragesic-100 15)  ! Zyrtec Allergy 16)  ! Amoxicillin 17)  ! Amoxicillin 18)  ! * Hctz 19)  ! * Lisinopril  Past History:  Past Medical History: Last updated: 09/11/2009 Allergic rhinitis Diabetes mellitus, type  II Hypertension Osteopenia Renal insufficiency Transient ischemic attack, hx of contact derm- buttocks ( uses steroid cream as needed)   cardilology---Dr Patty Sermons  Past Surgical History: Last updated: 02/26/2009 rotator cuff surgery right 8/08 Hysterectomy, 1 ovary- polyps (1973) Back surgery- dsk (03/1999) Kidney stone procedure (1999) Pyelonephritis Recurrent nosebleeds- cauterization Breast biopsy x 2- neg Dexa- osteopenia, hip (01/2000) MRI- chiari malformation (10/1997) Fall- rotator cuff tear (2004) MRI neck- thyroid nodule (02/2004) Adenosine cardiolite- neg (08/2004) 2D Echo- mod LVH (05/2005) MRI brain- neg CVA, ethmoid sinusitis, stable chiari malf (08/2005) Left shoiulder surgery (2007) L-5 nerve root block (10/2006) 2D echo mild LVH with impaired relaxation and mild aortic sclerosis 4/09 (read as no sig change from 06) shoulder surgery  Family History: Last updated: 07/25/2008 Father: strokes Mother: strokes  Social History: Last updated: 04/26/2008 Marital Status: single Children:  3 Occupation: retired non smoker no alcohol  Risk Factors: Smoking Status: never (10/13/2007)  Review of Systems General:  Complains of chills and fatigue; denies loss of appetite. Eyes:  Complains of eye irritation; denies blurring and discharge. ENT:  Complains of earache, nasal congestion, postnasal drainage, and sinus pressure; denies sore throat. CV:  Denies chest pain or discomfort and palpitations. Resp:  Complains of cough; denies shortness of breath and wheezing. GI:  Denies abdominal pain and indigestion. MS:  Complains of stiffness; denies muscle weakness. Derm:  Denies poor wound healing and rash. Neuro:  Denies headaches. Psych:  is generally stressed . Endo:  Denies excessive thirst and excessive urination.  Physical Exam  General:  obese but well appearing Head:  normocephalic, atraumatic, and no abnormalities observed.  frontal and maxillary sinus  tenderness noted bilat  Eyes:  vision grossly intact, pupils equal, pupils round, pupils reactive to light, and no injection.   Ears:  R ear normal and L ear normal.   Nose:  nare injected and congested bilat pus on R medial turbinates  Mouth:  pharynx pink and moist, no erythema, and no exudates.   Neck:  supple with full rom and no masses or thyromegally, no JVD or carotid bruit  Lungs:  Normal respiratory effort, chest expands symmetrically. Lungs are clear to auscultation, no crackles or wheezes. Heart:  Normal rate and regular rhythm. S1 and S2 normal without gallop, murmur, click, rub or other extra sounds. Abdomen:  soft and non-tender.   Msk:  no new joint changes  no bony tenderness  overall poor mobility due to OA and obesity- but can walk without assistance Pulses:  R and L carotid,radial,femoral,dorsalis pedis and posterior tibial pulses are full and equal bilaterally Extremities:  trace pedal edema bilat - without pitting  some compressible varicosities  Neurologic:  sensation intact to light touch, gait normal, and DTRs symmetrical and normal.   Skin:  Intact without suspicious lesions or rashes few areas of dry / pink skin on legs in patches- consistent with venous stasis dermatitis  Cervical Nodes:  No lymphadenopathy noted Psych:  seems down and generally tired today   Impression & Recommendations:  Problem # 1:  SINUSITIS - ACUTE-NOS (ICD-461.9) Assessment New with sinus congestion and chronic cough  will tx with avelox (is pcn all ) and tessalon  pt advised to update me if symptoms worsen or do not improve - esp cough or facial pain Her updated medication list for this problem includes:    Chest Congestion Relief 400 Mg Tabs (Guaifenesin) .Marland Kitchen... As needed    Tessalon 200 Mg Caps (Benzonatate) .Marland Kitchen... 1 by mouth up to three times a day as needed cough    Avelox 400 Mg Tabs (Moxifloxacin hcl) .Marland Kitchen... 1 by mouth once daily for 10 days for sinus infection  Problem # 2:   HYPERTENSION (ICD-401.9) Assessment: Improved  bp fairly controlled on cozaar  f/u 1-2 mo re check adv to cut sodium and inc water intake  if cough continues (had it with ace)- urged to update me  Her updated medication list for this problem includes:    Furosemide 40 Mg Tabs (Furosemide) .Marland Kitchen... 1 by mouth once daily  is from dr Patty Sermons    Cozaar 25 Mg Tabs (Losartan potassium) .Marland Kitchen... 1 by mouth once daily  BP today: 134/90-- re check at rest 130/80 Prior BP: 142/72 (09/11/2009)  Labs Reviewed: K+: 4.4 (09/11/2009) Creat: : 1.1 (09/11/2009)   Chol: 206 (09/11/2009)   HDL: 40.80 (  09/11/2009)   LDL: 139 (02/26/2009)   TG: 131.0 (09/11/2009)  Complete Medication List: 1)  Fosamax 70 Mg Tabs (Alendronate sodium) .Marland Kitchen.. 1 every week 2)  Adult Aspirin Ec Low Strength 81 Mg Tbec (Aspirin) .... Take 1 tablet by mouth once a day 3)  Tylenol Arthritis Pain 650 Mg Tbcr (Acetaminophen) .... As needed 4)  Caltrate 600+d Plus 600-400 Mg-unit Tabs (Calcium carbonate-vit d-min) .... Take one by mouth daily 5)  Fluticasone Propionate 0.05 % Crea (Fluticasone propionate) .... Apply to affected area once daily as needed irritation 6)  Lorazepam 0.5 Mg Tabs (Lorazepam) .... Take one half to  1 twice a day prn 7)  Flexeril 10 Mg Tabs (Cyclobenzaprine hcl) .... Take one half to 1 three times a day as needed 8)  Contour Brand Glucose Test Strips  .... To check glucose three times a day and as needed for poorly controlled dm2 250.0 9)  Centrum Silver Mvi  .Marland Kitchen.. 1 by mouth once daily 10)  Hydrocodone-acetaminophen 5-325 Mg Tabs (Hydrocodone-acetaminophen) .... Take 1 tablet by mouth every 6 hours as needed for pain 11)  Furosemide 40 Mg Tabs (Furosemide) .Marland Kitchen.. 1 by mouth once daily  is from dr brackbill 12)  Cinnamon 500 Mg Caps (Cinnamon) .... 1,000 mg. take 1 capsule by mouth two times a day 13)  Fish Oil Oil (Fish oil) .... Take 1 capsule by mouth two times a day 14)  Potassium 75 Mg Tabs (Potassium) .... Take  1 tablet by mouth once a day as needed 15)  L-lysine 500 Mg Tabs (Lysine) .... Take 1 tablet by mouth once a day 16)  Temazepam 30 Mg Caps (Temazepam) .... Take 1 tab by mouth at bedtime as needed 17)  Chest Congestion Relief 400 Mg Tabs (Guaifenesin) .... As needed 18)  Cozaar 25 Mg Tabs (Losartan potassium) .Marland Kitchen.. 1 by mouth once daily 19)  Tessalon 200 Mg Caps (Benzonatate) .Marland Kitchen.. 1 by mouth up to three times a day as needed cough 20)  Avelox 400 Mg Tabs (Moxifloxacin hcl) .Marland Kitchen.. 1 by mouth once daily for 10 days for sinus infection  Patient Instructions: 1)  take avelox for sinus infection  2)  continue the tessalon as needed for cough  3)  drink lots of fluids and avoid salt  4)  elevate legs whenever you can -- and use moisturizer on them  5)  update me if cough and other symptoms are not much improved in 10 days  6)  follow up with me in 1-2 months  Prescriptions: AVELOX 400 MG TABS (MOXIFLOXACIN HCL) 1 by mouth once daily for 10 days for sinus infection  #10 x 0   Entered and Authorized by:   Judith Part MD   Signed by:   Judith Part MD on 11/13/2009   Method used:   Print then Give to Patient   RxID:   [id number]   Prior Medications (reviewed today): FOSAMAX 70 MG  TABS (ALENDRONATE SODIUM) 1 every week ADULT ASPIRIN EC LOW STRENGTH 81 MG  TBEC (ASPIRIN) Take 1 tablet by mouth once a day TYLENOL ARTHRITIS PAIN 650 MG  TBCR (ACETAMINOPHEN) as needed CALTRATE 600+D PLUS 600-400 MG-UNIT TABS (CALCIUM CARBONATE-VIT D-MIN) take one by mouth daily FLUTICASONE PROPIONATE 0.05 %  CREA (FLUTICASONE PROPIONATE) apply to affected area once daily as needed irritation LORAZEPAM 0.5 MG  TABS (LORAZEPAM) take one half to  1 twice a day prn FLEXERIL 10 MG  TABS (CYCLOBENZAPRINE HCL) take one half to 1 three  times a day as needed CONTOUR BRAND GLUCOSE TEST STRIPS () to check glucose three times a day and as needed for poorly controlled DM2 250.0 CENTRUM SILVER MVI () 1 by mouth once  daily HYDROCODONE-ACETAMINOPHEN 5-325 MG TABS (HYDROCODONE-ACETAMINOPHEN) Take 1 tablet by mouth every 6 hours as needed for pain FUROSEMIDE 40 MG TABS (FUROSEMIDE) 1 by mouth once daily  is from Dr Patty Sermons CINNAMON 500 MG CAPS (CINNAMON) 1,000 mg. Take 1 capsule by mouth two times a day FISH OIL   OIL (FISH OIL) Take 1 capsule by mouth two times a day POTASSIUM 75 MG TABS (POTASSIUM) Take 1 tablet by mouth once a day as needed L-LYSINE 500 MG TABS (LYSINE) Take 1 tablet by mouth once a day TEMAZEPAM 30 MG CAPS (TEMAZEPAM) Take 1 tab by mouth at bedtime as needed CHEST CONGESTION RELIEF 400 MG TABS (GUAIFENESIN) as needed COZAAR 25 MG TABS (LOSARTAN POTASSIUM) 1 by mouth once daily TESSALON 200 MG CAPS (BENZONATATE) 1 by mouth up to three times a day as needed cough AVELOX 400 MG TABS (MOXIFLOXACIN HCL) 1 by mouth once daily for 10 days for sinus infection Current Allergies: ! VIOXX ! NSAIDS ! LIPITOR ! CORTISONE ! CRESTOR ! GLUCOPHAGE ! * VYTORIN ! CODEINE ! ZOCOR ! TORADOL ! SPIRONOLACTONE ! * TAPE (MEDICAL) ! [facility] ! DURAGESIC-100 ! ZYRTEC ALLERGY ! AMOXICILLIN ! AMOXICILLIN ! * HCTZ ! * LISINOPRIL

## 2010-12-12 NOTE — Letter (Signed)
Summary: Consuela Mimes MD  Consuela Mimes MD   Imported By: Lanelle Bal 08/15/2010 13:35:18  _____________________________________________________________________  External Attachment:    Type:   Image     Comment:   External Document

## 2010-12-12 NOTE — Consult Note (Signed)
Summary: [facility] MD  [facility] MD   Imported By: Lanelle Bal 08/02/2010 11:59:53  _____________________________________________________________________  External Attachment:    Type:   Image     Comment:   External Document

## 2010-12-12 NOTE — Letter (Signed)
Summary: Results Follow up Letter  [facility]  [address] [place], [place] [address]   Phone: [phone]  Fax: [phone]    05/01/2010 MRN: [id number]    [facility] [address] [place], [place]  [address]    Dear Ms. Butrum,  The following are the results of your recent test(s):  Test         Result    Pap Smear:        Normal _____  Not Normal _____ Comments: ______________________________________________________ Cholesterol: LDL(Bad cholesterol):         Your goal is less than:         HDL (Good cholesterol):       Your goal is more than: Comments:  ______________________________________________________ Mammogram:        Normal __X__  Not Normal _____ Comments:  Yearly follow up is recommended.   ___________________________________________________________________ Hemoccult:        Normal _____  Not normal _______ Comments:    _____________________________________________________________________ Other Tests:    We routinely do not discuss normal results over the telephone.  If you desire a copy of the results, or you have any questions about this information we can discuss them at your next office visit.   Sincerely,    Marne A. Milinda Antis, M.D.  MAT:lsf

## 2010-12-12 NOTE — Miscellaneous (Signed)
Summary: Orders Update  Clinical Lists Changes  Orders: Added new Test order of Venous Duplex Lower Extremity (Venous Duplex Lower) - Signed 

## 2010-12-13 NOTE — Miscellaneous (Signed)
Summary: CONTROLLED SUBSTANCES CONTRACT  CONTROLLED SUBSTANCES CONTRACT   Imported By: Wilder Glade 06/26/2010 12:50:45  _____________________________________________________________________  External Attachment:    Type:   Image     Comment:   External Document

## 2010-12-18 NOTE — Progress Notes (Signed)
Summary: lorazepam  Phone Note Refill Request Message from:  Fax from Pharmacy  Refills Requested: Medication #1:  LORAZEPAM 0.5 MG  TABS take one by mouth once daily as needed severe anxiety   Last Refilled: 05/17/2010 Refill request from [facility]. [phone]    Initial call taken by: Melody Comas,  December 09, 2010 1:46 PM  Follow-up for Phone Call        px written on EMR for call in  Follow-up by: Judith Part MD,  December 09, 2010 2:11 PM  Additional Follow-up for Phone Call Additional follow up Details #1::        Rx called to pharmacy Additional Follow-up by: Sydell Axon LPN,  December 09, 2010 4:59 PM    New/Updated Medications: LORAZEPAM 0.5 MG  TABS (LORAZEPAM) take one by mouth once daily as needed severe anxiety Prescriptions: LORAZEPAM 0.5 MG  TABS (LORAZEPAM) take one by mouth once daily as needed severe anxiety  #30 x 0   Entered and Authorized by:   Judith Part MD   Signed by:   Sydell Axon LPN on 78/46/9629   Method used:   Telephoned to ...       [facility]* (retail)       [address]       [place], [place]  [address]       Ph: [phone]       Fax: [phone]   RxID:   [id number]

## 2011-01-19 ENCOUNTER — Encounter: Payer: Self-pay | Admitting: Family Medicine

## 2011-01-20 LAB — HM DIABETES EYE EXAM: HM Diabetic Eye Exam: NORMAL

## 2011-01-21 LAB — CBC
HCT: 26.9 % — ABNORMAL LOW (ref 36.0–46.0)
HCT: 28.3 % — ABNORMAL LOW (ref 36.0–46.0)
HCT: 28.9 % — ABNORMAL LOW (ref 36.0–46.0)
HCT: 29.5 % — ABNORMAL LOW (ref 36.0–46.0)
HCT: 34.9 % — ABNORMAL LOW (ref 36.0–46.0)
Hemoglobin: 9.2 g/dL — ABNORMAL LOW (ref 12.0–15.0)
Hemoglobin: 9.5 g/dL — ABNORMAL LOW (ref 12.0–15.0)
Hemoglobin: 9.7 g/dL — ABNORMAL LOW (ref 12.0–15.0)
MCH: 30.4 pg (ref 26.0–34.0)
MCHC: 32.5 g/dL (ref 30.0–36.0)
MCHC: 33.5 g/dL (ref 30.0–36.0)
MCHC: 33.6 g/dL (ref 30.0–36.0)
MCHC: 34.2 g/dL (ref 30.0–36.0)
MCV: 88.8 fL (ref 78.0–100.0)
MCV: 89 fL (ref 78.0–100.0)
MCV: 89.8 fL (ref 78.0–100.0)
MCV: 91.3 fL (ref 78.0–100.0)
Platelets: 283 10*3/uL (ref 150–400)
RBC: 3.03 MIL/uL — ABNORMAL LOW (ref 3.87–5.11)
RDW: 13.3 % (ref 11.5–15.5)
RDW: 13.4 % (ref 11.5–15.5)
RDW: 13.5 % (ref 11.5–15.5)
RDW: 13.9 % (ref 11.5–15.5)
WBC: 10.3 10*3/uL (ref 4.0–10.5)
WBC: 11.3 10*3/uL — ABNORMAL HIGH (ref 4.0–10.5)
WBC: 12.9 10*3/uL — ABNORMAL HIGH (ref 4.0–10.5)

## 2011-01-21 LAB — GLUCOSE, CAPILLARY
Glucose-Capillary: 106 mg/dL — ABNORMAL HIGH (ref 70–99)
Glucose-Capillary: 127 mg/dL — ABNORMAL HIGH (ref 70–99)
Glucose-Capillary: 132 mg/dL — ABNORMAL HIGH (ref 70–99)
Glucose-Capillary: 143 mg/dL — ABNORMAL HIGH (ref 70–99)
Glucose-Capillary: 147 mg/dL — ABNORMAL HIGH (ref 70–99)
Glucose-Capillary: 151 mg/dL — ABNORMAL HIGH (ref 70–99)
Glucose-Capillary: 151 mg/dL — ABNORMAL HIGH (ref 70–99)
Glucose-Capillary: 153 mg/dL — ABNORMAL HIGH (ref 70–99)
Glucose-Capillary: 154 mg/dL — ABNORMAL HIGH (ref 70–99)
Glucose-Capillary: 161 mg/dL — ABNORMAL HIGH (ref 70–99)
Glucose-Capillary: 167 mg/dL — ABNORMAL HIGH (ref 70–99)
Glucose-Capillary: 221 mg/dL — ABNORMAL HIGH (ref 70–99)

## 2011-01-21 LAB — BASIC METABOLIC PANEL
BUN: 12 mg/dL (ref 6–23)
BUN: 15 mg/dL (ref 6–23)
BUN: 29 mg/dL — ABNORMAL HIGH (ref 6–23)
CO2: 26 mEq/L (ref 19–32)
CO2: 29 mEq/L (ref 19–32)
Calcium: 8.5 mg/dL (ref 8.4–10.5)
Chloride: 108 mEq/L (ref 96–112)
Chloride: 92 mEq/L — ABNORMAL LOW (ref 96–112)
Chloride: 97 mEq/L (ref 96–112)
GFR calc Af Amer: 45 mL/min — ABNORMAL LOW (ref >60)
GFR calc non Af Amer: 31 mL/min — ABNORMAL LOW (ref >60)
Glucose, Bld: 147 mg/dL — ABNORMAL HIGH (ref 70–99)
Glucose, Bld: 147 mg/dL — ABNORMAL HIGH (ref 70–99)
Glucose, Bld: 153 mg/dL — ABNORMAL HIGH (ref 70–99)
Potassium: 3.3 mEq/L — ABNORMAL LOW (ref 3.5–5.1)
Potassium: 3.8 mEq/L (ref 3.5–5.1)
Potassium: 4.5 mEq/L (ref 3.5–5.1)
Potassium: 4.5 mEq/L (ref 3.5–5.1)
Sodium: 132 mEq/L — ABNORMAL LOW (ref 135–145)
Sodium: 133 mEq/L — ABNORMAL LOW (ref 135–145)
Sodium: 135 mEq/L (ref 135–145)

## 2011-01-21 LAB — URINALYSIS, ROUTINE W REFLEX MICROSCOPIC
Bilirubin Urine: NEGATIVE
Ketones, ur: NEGATIVE mg/dL
Nitrite: NEGATIVE
Protein, ur: NEGATIVE mg/dL
pH: 7 (ref 5.0–8.0)

## 2011-01-21 LAB — PROTIME-INR
INR: 1.07 (ref 0.00–1.49)
INR: 1.45 (ref 0.00–1.49)
Prothrombin Time: 14.1 seconds (ref 11.6–15.2)
Prothrombin Time: 17.8 seconds — ABNORMAL HIGH (ref 11.6–15.2)

## 2011-01-21 LAB — COMPREHENSIVE METABOLIC PANEL
Albumin: 4 g/dL (ref 3.5–5.2)
BUN: 18 mg/dL (ref 6–23)
Calcium: 8.8 mg/dL (ref 8.4–10.5)
Creatinine, Ser: 1.1 mg/dL (ref 0.4–1.2)
Glucose, Bld: 99 mg/dL (ref 70–99)
Potassium: 4.4 mEq/L (ref 3.5–5.1)
Total Protein: 6.3 g/dL (ref 6.0–8.3)

## 2011-01-21 LAB — SURGICAL PCR SCREEN
MRSA, PCR: NEGATIVE
Staphylococcus aureus: NEGATIVE

## 2011-01-21 LAB — APTT: aPTT: 25 seconds (ref 24–37)

## 2011-01-24 LAB — DIFFERENTIAL
Basophils Absolute: 0 10*3/uL (ref 0.0–0.1)
Basophils Relative: 0 % (ref 0–1)
Eosinophils Absolute: 0.3 10*3/uL (ref 0.0–0.7)
Monocytes Absolute: 0.5 10*3/uL (ref 0.1–1.0)
Monocytes Relative: 5 % (ref 3–12)
Neutrophils Relative %: 71 % (ref 43–77)

## 2011-01-24 LAB — CBC
HCT: 34.2 % — ABNORMAL LOW (ref 36.0–46.0)
Hemoglobin: 11.8 g/dL — ABNORMAL LOW (ref 12.0–15.0)
MCH: 30.8 pg (ref 26.0–34.0)
MCHC: 34.5 g/dL (ref 30.0–36.0)
RDW: 13.1 % (ref 11.5–15.5)

## 2011-01-24 LAB — BASIC METABOLIC PANEL
BUN: 22 mg/dL (ref 6–23)
CO2: 28 mEq/L (ref 19–32)
Calcium: 9.9 mg/dL (ref 8.4–10.5)
Glucose, Bld: 107 mg/dL — ABNORMAL HIGH (ref 70–99)
Sodium: 142 mEq/L (ref 135–145)

## 2011-01-28 NOTE — Letter (Signed)
Summary: Diabetic Eye Exam/[facility]  Diabetic Eye Exam/[facility]   Imported By: Maryln Gottron 01/24/2011 08:32:24  _____________________________________________________________________  External Attachment:    Type:   Image     Comment:   External Document  Appended Document: Diabetic Eye Exam/[facility]     Clinical Lists Changes  Observations: Added new observation of DMEYEEXAMNXT: 01/2012 (01/25/2011 16:26) Added new observation of DMEYEEXMRES: normal (01/20/2011 16:26) Added new observation of EYE EXAM BY: [facility] (01/20/2011 16:26) Added new observation of DIAB EYE EX: normal (01/20/2011 16:26)        Diabetes Management Exam:    Eye Exam:       Eye Exam done elsewhere          Date: 01/20/2011          Results: normal          Done by: [employer]

## 2011-02-19 LAB — COMPREHENSIVE METABOLIC PANEL
ALT: 24 U/L (ref 0–35)
AST: 27 U/L (ref 0–37)
Albumin: 3.9 g/dL (ref 3.5–5.2)
Calcium: 10.3 mg/dL (ref 8.4–10.5)
GFR calc Af Amer: >60 mL/min (ref >60)
Sodium: 140 mEq/L (ref 135–145)
Total Protein: 6.2 g/dL (ref 6.0–8.3)

## 2011-02-19 LAB — CBC
MCHC: 34.1 g/dL (ref 30.0–36.0)
RDW: 13.5 % (ref 11.5–15.5)

## 2011-02-19 LAB — URINALYSIS, ROUTINE W REFLEX MICROSCOPIC
Glucose, UA: NEGATIVE mg/dL
Protein, ur: NEGATIVE mg/dL
Specific Gravity, Urine: 1.009 (ref 1.005–1.030)
pH: 7 (ref 5.0–8.0)

## 2011-02-19 LAB — GRAM STAIN

## 2011-02-19 LAB — GLUCOSE, CAPILLARY
Glucose-Capillary: 117 mg/dL — ABNORMAL HIGH (ref 70–99)
Glucose-Capillary: 150 mg/dL — ABNORMAL HIGH (ref 70–99)

## 2011-02-19 LAB — URINE MICROSCOPIC-ADD ON

## 2011-02-19 LAB — ANAEROBIC CULTURE

## 2011-02-19 LAB — BODY FLUID CULTURE

## 2011-02-24 ENCOUNTER — Telehealth: Payer: Self-pay | Admitting: *Deleted

## 2011-02-24 NOTE — Telephone Encounter (Signed)
Patient phoned c/o severe headache yesterday, coming back today.  Advised to contact PCP

## 2011-02-27 ENCOUNTER — Ambulatory Visit (HOSPITAL_COMMUNITY): Payer: Self-pay

## 2011-02-28 ENCOUNTER — Ambulatory Visit (HOSPITAL_COMMUNITY)
Admission: RE | Admit: 2011-02-28 | Discharge: 2011-02-28 | Disposition: A | Discharge status: Home / Self Care | Payer: Medicare Other | Source: Ambulatory Visit | Attending: Orthopedic Surgery | Admitting: Orthopedic Surgery

## 2011-02-28 DIAGNOSIS — M79609 Pain in unspecified limb: Secondary | ICD-10-CM | POA: Insufficient documentation

## 2011-02-28 DIAGNOSIS — Z96659 Presence of unspecified artificial knee joint: Secondary | ICD-10-CM | POA: Insufficient documentation

## 2011-03-14 ENCOUNTER — Other Ambulatory Visit: Payer: Self-pay | Admitting: *Deleted

## 2011-03-14 MED ORDER — LORAZEPAM 0.5 MG PO TABS: ORAL_TABLET | ORAL | Status: DC

## 2011-03-14 NOTE — Telephone Encounter (Signed)
Medication phoned to [facility] as instructed.  

## 2011-03-14 NOTE — Telephone Encounter (Signed)
Px written for call in   

## 2011-03-24 ENCOUNTER — Encounter: Payer: Self-pay | Admitting: Family Medicine

## 2011-03-25 ENCOUNTER — Ambulatory Visit (INDEPENDENT_AMBULATORY_CARE_PROVIDER_SITE_OTHER): Payer: Medicare Other | Admitting: Family Medicine

## 2011-03-25 ENCOUNTER — Encounter: Payer: Self-pay | Admitting: Family Medicine

## 2011-03-25 DIAGNOSIS — I10 Essential (primary) hypertension: Secondary | ICD-10-CM

## 2011-03-25 DIAGNOSIS — R519 Headache, unspecified: Secondary | ICD-10-CM | POA: Insufficient documentation

## 2011-03-25 DIAGNOSIS — R42 Dizziness and giddiness: Secondary | ICD-10-CM | POA: Insufficient documentation

## 2011-03-25 DIAGNOSIS — R51 Headache: Secondary | ICD-10-CM

## 2011-03-25 LAB — RENAL FUNCTION PANEL
Chloride: 105 mEq/L (ref 96–112)
GFR: 46.51 mL/min — ABNORMAL LOW (ref >60.00)
Glucose, Bld: 106 mg/dL — ABNORMAL HIGH (ref 70–99)
Phosphorus: 3.5 mg/dL (ref 2.3–4.6)
Potassium: 5 mEq/L (ref 3.5–5.1)
Sodium: 142 mEq/L (ref 135–145)

## 2011-03-25 LAB — SEDIMENTATION RATE: Sed Rate: 14 mm/hr (ref 0–22)

## 2011-03-25 NOTE — Consult Note (Signed)
NAMENILEY, Linda [name]                ACCOUNT NO.:  [id number]   MEDICAL RECORD NO.:  [id number]          PATIENT TYPE:  INP   LOCATION:  5012                         FACILITY:  [facility]   PHYSICIAN:  Madlyn Frankel. Charlann Boxer, M.D.  DATE OF BIRTH:  [birth year]   DATE OF CONSULTATION:  11/14/2007  DATE OF DISCHARGE:                                 CONSULTATION   CHIEF COMPLAINT:  Multiple areas of pain after fall.   HISTORY OF PRESENT ILLNESS:  Linda [name] is a pleasant 77 year old female  who presented to the emergency room on November 12, 2007 after a fall. She  was on a ramp near her house when she fell, landed on her left shoulder  predominantly. She also hit her right hip. She complained of severe pain  and was sent to the emergency room where she was radiographed for  shoulder and pelvis CT, which were all negative for fracture. She was  admitted for pain control. We were consulted on her hospital day 2 when  she was getting ready for discharge but had complaints of enough pain  that they were not able to discharge her. In discussion with her about  her current situation, she reports pain all over. She does not  understand how it got worse over the last day or so.   PAST MEDICAL HISTORY:  Includes osteoporosis, hypertension,  hyperlipidemia, history of renal insufficiency, type 2 diabetes.   PAST SURGICAL HISTORY:  Includes right rotator cuff surgery in 2008 by  Dr. Rennis Chris. Remote left shoulder surgery. History of neck surgery.   SOCIAL HISTORY:  She lives with a daughter. She is divorced. Denies  alcohol and tobacco and is retired.   PHYSICAL EXAMINATION:  GENERAL:  Linda [name] is a pleasant 77 year old  female. Awake, alert, and oriented. Laying in her hospital bed. She is  comfortable in one position.  EXTREMITIES:  Examination of her hips reveals that she tolerates hip  range of motion  with some pain. She has tenderness to palpation in  bilateral upper shoulder and wrists, left greater than  right. She has  palpable pulses and intact sensibility. I find no evidence of any  neurologic compromise. Passive range of motion  includes shoulder pain  on the left upper extremity at the elbow and at the shoulder. There is  no effusion, cuts, lacerations appreciated. Radiographs are ordered  through the [facility] system on her initial evaluation, including today on  her left wrist and were negative for obvious fracture.   ASSESSMENT:  1. Multiple contusions to the upper extremities, left greater than      right, left wrist and left shoulder.  2. Right hip contusion.   PLAN:  I reviewed with Linda [name] and her daughter, current situation and  would recommend continued conservative care. The potential etiology for  her increased discomfort could be delayed onset soreness. Recommend  using Tylenol around the clock, as she uses this for pain as opposed to  anti-inflammatories, which causes nasal bleeding. She will have her  Fentanyl patch that was prescribed through Dr. Dolores Lory, increased  from 25  to 50 mcg, and after relief. I ordered a left wrist splint, left  upper extremity splint, and an elevated arm rest for her walker, in an  effort to provide some relief and allow for easier mobility. I briefly  mentioned that she may need to go to a nursing facility for  rehabilitation if she is unable to go home and she is not interested in  that at all and worked very hard in order to prevent that.   FOLLOWUP:  I have suggested that she followup with Dr. Rennis Chris or someone  in our office within a week, so we can followup with her course.  Questions were encouraged, answered, and reviewed today.      Madlyn Frankel Charlann Boxer, M.D.  Electronically Signed     MDO/MEDQ  D:  11/14/2007  T:  11/14/2007  Job:  [id number]

## 2011-03-25 NOTE — Progress Notes (Signed)
Subjective:    Patient ID: Linda [name], female    DOB: [birth year], 77 y.o.   MRN: [id number]  HPI A lot of problems with headaches on and off for 2 weeks - pain in L temple and across the head (both sides but worse in L ) Sees flashing lights in vision occas too -- worse in the left eye  No hx of headaches in the past  Had her glasses changed 1 mo ago - with bifocal - making her dizzy Light headed and worse to look up and down   Also has cataract both eyes - worse in L eye too   R leg is worse with pain and swelling - sees Dr Ethelene Hal for that  Uses a cream - not on her list --? What it is for  No infection  No joint inj lately- no steroids  Had surg in dec   Past Medical History  Diagnosis Date  . Allergy     allergic rhinitis  . Diabetes mellitus     type II  . Hypertension   . Osteopenia   . Renal insufficiency   . TIA (transient ischemic attack)   . Contact dermatitis     on buttocks  . Pyelonephritis   . Nosebleed     History   Social History  . Marital Status: Single    Spouse Name: N/A    Number of Children: N/A  . Years of Education: N/A   Occupational History  . Not on file.   Social History Main Topics  . Smoking status: Never Smoker   . Smokeless tobacco: Not on file  . Alcohol Use: No  . Drug Use:   . Sexually Active:    Other Topics Concern  . Not on file   Social History Narrative  . No narrative on file        Review of Systems Review of Systems  Constitutional: Negative for fever, appetite change, fatigue and unexpected weight change.  Eyes: Negative for pain and visual disturbance.  Respiratory: Negative for cough and shortness of breath.   Cardiovascular: Negative. For cp or sob   Gastrointestinal: Negative for nausea, diarrhea and constipation.  Genitourinary: Negative for urgency and frequency.  Skin: Negative for pallor.  MSK pos for chronic back and joint change  Neurological: Negative for weakness, , numbness and speech  problems and cognitive change  Hematological: Negative for adenopathy. Does not bruise/bleed easily.  Psychiatric/Behavioral: Negative for dysphoric mood. The patient is not nervous/anxious.          Objective:   Physical Exam  Constitutional: She is oriented to person, place, and time. She appears well-developed and well-nourished. No distress.       overwt and well appearing   HENT:  Head: Normocephalic and atraumatic.  Right Ear: External ear normal.  Left Ear: External ear normal.  Nose: Nose normal.  Mouth/Throat: Oropharynx is clear and moist.       Fundi grossly wnl  No sinus tenderness  Eyes: Conjunctivae and EOM are normal. Pupils are equal, round, and reactive to light.       No nystagmus   Neck: Normal range of motion. Neck supple. No JVD present. Carotid bruit is not present. No thyromegaly present.  Cardiovascular: Normal rate, regular rhythm and normal heart sounds.   Pulmonary/Chest: Effort normal and breath sounds normal. No respiratory distress. She has no wheezes.       No crackles   Abdominal: Soft. Bowel sounds are normal.  She exhibits no distension and no mass. There is no tenderness.  Musculoskeletal: She exhibits edema. She exhibits no tenderness.       Poor rom LS bilat edema trace to one plus No skin change/ warmth or erythema    Lymphadenopathy:    She has no cervical adenopathy.  Neurological: She is alert and oriented to person, place, and time. She has normal reflexes. She displays no atrophy and no tremor. No cranial nerve deficit or sensory deficit. She exhibits normal muscle tone. She displays a negative Romberg sign. She displays no seizure activity. Coordination and gait normal.       No temporal tenderness    Skin: Skin is warm and dry. No rash noted. No erythema. No pallor.  Psychiatric: She has a normal mood and affect.          Assessment & Plan:

## 2011-03-25 NOTE — Op Note (Signed)
NAMEQUEEN, ABBETT                ACCOUNT NO.:  [id number]   MEDICAL RECORD NO.:  [id number]          PATIENT TYPE:  INP   LOCATION:  5003                         FACILITY:  [facility]   PHYSICIAN:  Vania Rea. Supple, M.D.  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   PROCEDURE:  Right shoulder reverse arthroplasty utilizing a cemented  size 10 standard stem with a 42 eccentric glenosphere.   SURGEON:  Vania Rea. Supple, MD   ASSISTANT:  Lucita Lora. Shuford, PA-C   ANESTHESIA:  General endotracheal as well as an interscalene block.   ESTIMATED BLOOD LOSS:  300 mL.   DRAINS:  None.   HISTORY:  Ms. Ofallon is a 77 year old female who had a previous right  shoulder hemiarthroplasty with revision for the treatment of a right  shoulder rotator cuff tear arthropathy.  She has unfortunately gone on  to a persistent anterosuperior escape of the implant with superior  instability of the shoulder and severe pain as well as functional  limitations with inability to elevate the right arm.  She is brought to  the operating room at this time for plan of shoulder reverse  arthroplasty.   Preoperatively, I counseled Ms. Dosanjh on treatment options as well as  risks versus benefits thereof.  Possible surgical complications of  bleeding, infection, neurovascular injury, persistence of pain, loss of  motion, instability of implant, and possible need for revision surgery  were all reviewed.  She understands and accepts and agrees with our  planned procedure.   PROCEDURE IN DETAIL:  After undergoing routine preop evaluation, the  patient received prophylactic antibiotics and an interscalene block was  established in the holding area by the  Anesthesia Department.  Placed  supine on the operative table and underwent smooth induction of general  endotracheal anesthesia.  Placed into a beach chair position and  appropriately padded and protected.  The right shoulder girdle region  was then sterilely prepped and draped in standard fashion.  A time-out  was called.  An anterior approach of the right shoulder was made through  a previous deltopectoral incision beginning at the coracoid descending  laterally and distally total length approximately 15 cm.  Skin flaps  were mobilized and electrocautery was used for hemostasis.  Dissection  was then carried deeply identifying the deltopectoral interval, although  this was quite scarified and the anatomic landmarks were quite difficult  to identify, but with meticulous dissection, we were able to redevelop  the deltopectoral interval and elevate the deltoid off the proximal  humerus and retract the major medially.  Conjoint tendon was then  mobilized and retracted medially.  We then divided the subscapularis  from its insertion across the lesser tuberosity and tagged free margin,  mobilized the subscapularis and retracted it medially.  The humeral head  implant was then delivered through the wound, and we  did obtain cultures  of the synovial fluid and also sent specimens of the synovium for frozen  sections as well as cultures and the frozen sections did come back with  no evidence for acute inflammation or no evidence for bacteria on the  Gram stain.  We then removed the humeral head implant and then used an  osteotome to circumferentially release the metaphyseal portion of the  humeral stem using combination of flexible and rigid narrow osteotomes.  We were ultimately able to retrieve the implant.  We then prepared the  humeral canal and a size 12 implant had been removed.  We did use a  reamer to clean the canal and reamed up to a size 10 and then used the  proximal metaphyseal  reamer size 1 to properly prepare the metaphyseal  region of the proximal humerus.  We then placed a trial implant into the  humeral canal size 8 just to help protect the proximal bone from the  retractors during exposure of the glenoid.  We then exposed the glenoid  circumferentially using electrocautery to divide the capsular  attachments around the rim of the glenoid circumferentially and series  of pronged retractors were then used to expose the glenoid  circumferentially.  The glenoid showed almost complete loss of cartilage  with eburnated bone noted.  We did place a guide pin in the center of  the glenoid just below the midline and then passed the starting reamer  over this maintaining a very slight inferior inclination of the reaming  angle and obtaining a clean bony bed and smooth and congruent surface.  We then used the hand reamer to complete the peripheral reaming of the  glenoid.  We then placed a central drill hole for the stabilizing  central peg of the glenoid base plate.  The base plate was then impacted  into position, and then we placed the locking screws inferiorly and  superiorly obtaining excellent purchase and then the anterior and  posterior nonlocking screws and then performed a final locking of the  superior and inferior screws and excellent purchase was achieved.  We  then placed a trial 42 eccentric glenosphere and then performed a series  of reductions with trial implants of the humerus and determined that we  had proper bony resections.  With this, we then went ahead and applied  the final size 42 eccentric glenosphere and impacted this and locked  into position.  We then turned our attention to the humerus where we  meticulously irrigated the canal after placing a distal cement plug.  Cement was then mixed and introduced in retrograde fashion and then the  final size 10 implant was introduced into the humeral canal maintaining  approximately 20 degrees of  retroversion.  It was delivered to the  appropriate height and cement was allowed to harden and meticulous  removal of all extra cement was then completed.  We then performed a  trial reduction and found that the +3 neck length had the excellent soft  tissue balance with good stability and good mobility of the shoulder.  Trial was removed and the final +3 polyethylene implant was then  impacted and the final reduction was then performed.  Again, the  shoulder showed excellent mobility.  We did find at this point that the  glenosphere had loosened and had slightly rotated, and so we were able  to gain access to the locking screw of the glenosphere, redirect the  eccentricity so the offset was  directed inferiorly and then re-tightened  the glenosphere with excellent purchase and much to our satisfaction.  The fine mobility showed excellent stability and motion of the shoulder.  We then repaired the subscapularis through bone tunnels on to the lesser  tuberosity region.  Final irrigation and hemostasis were obtained.  The  deltopectoral interval was then reapproximated with a series of #2  FiberWire sutures.  A 0 Vicryl was used for the deep subcu, 2-0 Vicryl  for the superficial subcu and intracuticular 3-0 Monocryl for the skin  followed by Steri-Strips.  A dry dressing was then placed to the right  shoulder and right arm was placed in sling and immobilizer.  The patient  was then placed supine, awakened, extubated, and taken to the recovery  room in stable condition.      Vania Rea. Supple, M.D.  Electronically Signed     KMS/MEDQ  D:  02/15/2009  T:  02/16/2009  Job:  [id number]

## 2011-03-25 NOTE — Patient Instructions (Signed)
Follow up with your eye doctor about the dizziness and headache/ re check your glasses  We will check labs today for sed rate to rule out temporal artery problem as cause of headache If problem persists and tests are normal -- we may have to re check an MRI  Please update me if symptoms worsen

## 2011-03-25 NOTE — Assessment & Plan Note (Signed)
[facility]                                 ON-CALL NOTE   NAME:Linda [name], Linda [name]                         MRN:          [id number]  DATE:10/09/2007                            DOB:          03/07/1930    TIME OF CALL:  October 09, 2007 at 8:45 a.m.   PHONE NUMBER:  [phone]   PRIMARY CARE PHYSICIAN:  Roxy Manns, MD   CHIEF COMPLAINT:  Medicine reaction.   She said she was seen Wednesday and EKG was done.  She had increased  blood pressure and she was put on hydrochlorothiazide for swelling.  She  took her first dose yesterday and has been vomiting every since; she was  vomiting all night and is weak.  She has no fever, a little bit of  abdominal pain, but not much; she thinks it is just from throwing up.  No diarrhea or other symptoms.  I told her daughter, [name], that  I did not think this was necessarily a reaction of the medicine, since  it was somewhat atypical.  She also said that she was exposed to a  stomach virus from a nephew recently.  I told her regardless, to go  ahead and take her on to the emergency room for evaluation now, that she  may need fluids and medicine to stop the vomiting and this is what she  is going to do.     Marne A. Tower, MD  Electronically Signed    MAT/MedQ  DD: 10/09/2007  DT: 10/09/2007  Job #: [id number]

## 2011-03-25 NOTE — Op Note (Signed)
Linda [name], Linda [name]                ACCOUNT NO.:  [id number]   MEDICAL RECORD NO.:  [id number]          PATIENT TYPE:  OIB   LOCATION:  5017                         FACILITY:  [facility]   PHYSICIAN:  Vania Rea. Supple, M.D.  DATE OF BIRTH:  [birth year]   DATE OF PROCEDURE:  05/20/2007  DATE OF DISCHARGE:                               OPERATIVE REPORT   PREOPERATIVE DIAGNOSIS:  Right shoulder anterior superior instability  after a right shoulder CTA hemiarthroplasty.   POSTOPERATIVE DIAGNOSIS:  Right shoulder anterior superior instability  after a right shoulder CTA hemiarthroplasty with the intraoperative  finding of deficiency of the subscapularis.   PROCEDURE:  Open right shoulder anterior reconstruction with repair of  the subscapularis.   SURGEON OF RECORD:  Vania Rea. Supple, M.D.   ASSISTANTFrench Ana Shuford PA-C.   ANESTHESIA:  General endotracheal as well as a preop interscalene block.   ESTIMATED BLOOD LOSS:  250 mL.   DRAINS:  Hemovac x1.   HISTORY:  Linda [name] is a 77 year old female who developed a right  shoulder rotator cuff tear arthropathy and due to severe pain and  functional limitations underwent a right shoulder cuff tear arthropathy  (CTA) hemiarthroplasty back in April of this year.  She initially did  extremely well with excellent pain relief and good function.  However,  several weeks ago she awoke with abrupt onset of severe right shoulder  pain with a sensation of the anterior instability and on clinical  examination she was noted to have the humeral head prominence anterior  superior about the shoulder with severe pain on attempts at elevation of  the arm.  Her plain radiographs did not show any obvious displacement of  the humeral implant but she does show evidence for anterior superior  migration of the humeral head.  My suspicion is that she is had a  disruption of the subscapularis along with an incompetent coracoacromial  arch.  She was brought to the  operating at this time for planned  surgical exploration and repair and reconstruction as indicated.   We counseled Linda [name] on treatment options as well as risks versus  benefits thereof.  Possible surgical complications of bleeding,  infection, neurovascular injury, persistent pain, loss of motion,  recurrence of instability with possible need for additional surgery were  all reviewed.  She understands and accepts and agrees with our planned  procedure.   PROCEDURE IN DETAIL:  After undergoing routine preop evaluation, the  patient received prophylactic antibiotics.  An interscalene block was  established in the holding area by the anesthesia.  Placed supine on the  operating table and underwent smooth induction of a general endotracheal  anesthesia.  She was maintained in the supine position with a small bump  placed into the right scapula.  The right shoulder girdle and right  upper extremity were then sterilely prepped and draped in standard  fashion.  A standard anterior deltopectoral approach was then made  through the previous incision and this was extended somewhat proximally  and distally for a total length approximately 20 cm.  Sharp dissection  carried down through skin and subcutaneous tissues.  Electrocautery was  used for hemostasis.  The deltopectoral interval was identified and the  cephalic vein was dissected, identified and dissected, then retracted  laterally, although it appeared that it had thrombosed.  There was  significant scarring along the deltopectoral interval and this was  carefully reopened from proximal to distal.  Self-retaining retractor  was then placed.  We then identified the coracoid process and conjoined  tendons and freed these away from adhesions to the inner surface of the  pectoralis major.  We then dissected beneath the conjoined tendon and  freed up the subscapularis.  The deltoid had become adherent to the  humeral head and this was  carefully dissected free as well.  Upon  entering the glenohumeral joint space, clear yellow synovial fluid was  identified.  We did obtain cultures and sent this for routine aerobic  and anaerobic cultures of the synovial fluid.  We then carefully  inspected the construct and did see evidence that the humeral head  easily subluxed anterosuperiorly.  We carefully inspected the previous  subscapularis repair and the anterior tissues did appear to be markedly  attenuated.  While we did not see an obvious disruption of the  subscapularis, certainly tissues were very attenuated particularly on  the superior margin of the subscapularis.  We went ahead and then  divided the insertion of the subscapularis away from the lesser  tuberosity and followed this along the inferior margin of the  subscapularis and then removed the previously placed sutures, placed a  new series of grasping #2 FiberWire sutures along the free margin.  Using this as retraction, I then dissected along the anterior-posterior  inferior and superior aspect the subscapularis and obtained reasonably  good mobility.  There did appear to be viable muscle fibers although  there was some generalized atrophy and attenuation of the tissues.  We  then carefully inspected the glenohumeral joint space.  The implant was  in good position.  It showed proper version.  It was seated with easy to  properly repositioned the humeral head over the glenoid.  There is no  obvious posterior instability.  A reapproximation of the subscapularis  to the lesser tuberosity in a more appropriate length tension  relationship showed the humeral head did staying well seated within the  glenoid and I did not see any obvious superior instability.  I did  palpate superiorly and found that the coracoacromial ligament was intact  and did not see any obvious deficiency of the bony structures  superiorly.  With the findings of the attenuated and stretched out   repair the subscapularis it was felt that by performing a repeat repair  of the subscapularis and applying a more appropriate length tension  relationship, the anterior instability would hopefully be resolved.  With this in mind, we went ahead and placed a series of three bone  tunnels through the greater tuberosity and passed the suture limbs from  the subscapularis through these bone tunnels and then tied these  sequentially.  In addition, the proximal stump of the biceps tendon from  the previous biceps tenodesis was identified and this was incorporated  into this repair.  We then closed the tissues superiorly at the rotator  interval and imbricated this area and further reinforced the anterior-  superior soft tissue repair.  At this point the self-retaining retractor  was then removed.  We carefully inspected the wound and obtained  meticulous hemostasis.  Irrigation  was completed.  The deltopectoral  interval was then reapproximated with simple of 0-0 Vicryl sutures.  2-0  Vicryl was used to close the subcu layer and intracuticular 3-0 Monocryl  used to close the skin followed by Steri-Strips.  A bulky dry dressing  was applied.  It was then placed in a sling immobilizer and kept  abducted across the abdomen.  The patient was then awakened, extubated,  and taken to recovery room in stable condition.  This to the involvement  dictation by Caryn Bee   SUMMARY:  The patient table and these [id number] for much      Vania Rea. Supple, M.D.  Electronically Signed     KMS/MEDQ  D:  05/20/2007  T:  05/21/2007  Job:  [id number]

## 2011-03-25 NOTE — H&P (Signed)
Linda [name], MOLITOR                ACCOUNT NO.:  [id number]   MEDICAL RECORD NO.:  [id number]          PATIENT TYPE:  EMS   LOCATION:  MAJO                         FACILITY:  [facility]   PHYSICIAN:  Barbette Hair. Artist Pais, DO      DATE OF BIRTH:  Mar 31, 1930   DATE OF ADMISSION:  11/12/2007  DATE OF DISCHARGE:                              HISTORY & PHYSICAL   PRIMARY CARE PHYSICIAN:  Dr. Idamae Schuller A. Tower   CHIEF COMPLAINT:  Status post fall with left shoulder and right hip  pain.   HISTORY OF PRESENT ILLNESS:  The patient is a 77 year old white female  who presents after a fall. She was out on a ramp near her house when she  slipped on some ice. She tried to brace her fall but hit her left  shoulder and right hip.  Patient complains of severe discomfort.  She  was evaluated in the ER with an x-ray of the left shoulder, pelvis, and  CT of the pelvis.  All studies were negative for fracture.  However,  patient continues to have severe discomfort. The patient has history of  unsteady gait and uses a cane at home.  She is chronically on pain  medication for right shoulder pain and neck pain. She is currently on 25  mcg of Fentanyl patch and Percocet for break-through pain.   The patient's family reports she was lying on a concrete ramp for over  an hour until family arrived.  She denies any chest pain, shortness of  breath.   PAST MEDICAL HISTORY:  1. History of osteoporosis.  2. Hypertension.  3. Hyperlipidemia.  4. History of renal insufficiency.  5. Type 2 diabetes.   PAST SURGICAL HISTORY:  Rotator cuff surgery August of 2008.  Remote  left shoulder surgery.  History of neck surgery.   SOCIAL HISTORY:  She lives with her daughter. She is divorced. She  denies alcohol or tobacco.  She is retired.   FAMILY HISTORY:  Father died of stroke.  Mother died of myocardial  infarction, she also had history of breast cancer.   REVIEW OF SYSTEMS:  As noted above. All other systems negative.   LABORATORY DATA:  CBC showed WBC of 11.8 thousand, H&H of 13.8, 39.8,  platelet count of 336,000.  PT was 1. Comprehensive metabolic profile  notable for sodium 139, potassium 4.2, BUN 17, creatinine 1.04, blood  glucose of 130, calcium was 9.5.  LFTs were unremarkable. UA was  positive for leukocyte esterase.   EKG showed normal sinus rhythm at 78 beats per minute, no acute ST  changes.   CURRENT MEDICATIONS:  1. Fentanyl patch 25 mcg q.72 hours.  2. Percocet 1 q.6-8 hours p.r.n.  3. Fosamax 70 mg q. weekly.  4. Cyclobenzaprine 10 mg p.r.n.  5. Hydrochlorothiazide 25 mg once daily.  6. Lasix 40 mg p.r.n.  7. Lorazepam 0.5 mg p.r.n.  8. Zetia 10 mg once a day.   X-RAY STUDIES:  Chest x-ray showed COPD but no active pulmonary disease.  CT of pelvis negative for fracture.  X-ray of left shoulder  and arm negative for fracture.   ALLERGIES:  VIOXX,  NSAIDs, STATIN's, GLUCOPHAGE, CODEINE, MORPHINE,  TORADOL, TAPE.   PHYSICAL EXAMINATION:  VITAL SIGNS:  The patient is afebrile, blood  pressure is 143/89, pulse is 84, respirations 24, she is saturating 98%  on room air.  GENERAL:  The patient is a pleasant, obese, 77 year old white female.  Awake, alert and oriented x3.  HEENT:  Normocephalic, atraumatic.  Pupils are equal and reactive to  light bilaterally. Extraocular movements are intact. The patient was  anicteric. Mucous membranes were moist.  NECK:  Supple.  CHEST EXAM:  Normal expiratory effort, chest was clear to auscultation  bilaterally, no rhonchi, rales or wheezing.  CARDIOVASCULAR:  Regular rate and rhythm, no significant murmurs, rubs,  or gallops appreciated.  ABDOMEN:  Protuberant but nontender. Positive bowel sounds.  EXTREMITIES:  Left upper shoulder tenderness, no obvious deformity.  Patient had good radial pulse bilaterally. Good hand grip.   Right upper hip tenderness, she was able to flex her right lower  extremity without significant pain, no pain with  internal and external  rotation of her right hip.   No lower extremity edema.   ASSESSMENT AND PLAN:  1. Status post fall with intractable right hip/back pain.  2. Hypertension.  3. History of osteoporosis.  4. Hyperlipidemia.  5. Type 2 diabetes.   RECOMMENDATIONS:  1. CT of pelvis negative for fracture, rule out lumbar compression      fracture with x-ray.  Fentanyl IV 50 mcg p.r.n.  2. Consult physical therapy, she may need short term rehabilitation.  3. Check CPK to rule out rhabdomyolysis.  4. Lovenox 40 mcg SQ daily for deep venous thrombosis prophylaxis.  5. Continue hydrochlorothiazide for hypertension.  6. Hold Fosamax since patient is not able to sit upright.  7. Carbohydrate modified diet with sensitive Nova log sliding scale      coverage.      Barbette Hair. Artist Pais, DO  Electronically Signed     RDY/MEDQ  D:  11/12/2007  T:  11/12/2007  Job:  [id number]   cc:   Marne A. Milinda Antis, MD

## 2011-03-25 NOTE — Assessment & Plan Note (Signed)
[facility]                                 ON-CALL NOTE   NAME:Linda [name], Linda [name]                         MRN:          [id number]  DATE:10/09/2007                            DOB:          07-Oct-1930    CALLER:  Lovette Cliche   TIME OF CALL:  3:45pm   TELEPHONE NUMBER:  [phone]   CHIEF COMPLAINT:  Update from the emergency room.   The patient just got back from the emergency room. She was treated for  nausea and got IV fluids. She was told that she may, in fact, have food  poisoning. An abdominal x-ray was okay. They recommended follow up with  her primary care physician, who is me, next week. They did prescribe her  an anti-nausea and they cannot get pre-approved and it is too expensive  to pay for. I think that is possibly Zofran, but they are not sure. She  cannot have Phenergan because in the past she has had side effects from  Benadryl and similar medicines. They have an antinausea medicine from  [facility] that may be holistic. They are unsure what is in it, but  wanted to know if they could try that. She is feeling queasy, but  overall, she is feeling better than she was. I told them to go ahead and  try the antinausea medicine over-the-counter in a small dose. If she  does not have any side effects, she can continue it. Otherwise, to call  me back if no further improvement or if her nausea increases or vomiting  begins again. Otherwise, they will follow up next week.     Marne A. Tower, MD  Electronically Signed    MAT/MedQ  DD: 10/09/2007  DT: 10/10/2007  Job #: [id number]

## 2011-03-25 NOTE — Assessment & Plan Note (Signed)
See assessment for ha  Nl exam today - but has hx of TIA and arnold chiari malformation  Low threshold for mri if not imp

## 2011-03-25 NOTE — Assessment & Plan Note (Signed)
Ever since glasses were changed  Suspect that is the cause In light of temple pain -however will check sed rate to r/o TA  If all nl and no imp - consider MRI (pt has hx of chiari mal)  Nl exam today

## 2011-03-27 ENCOUNTER — Telehealth: Payer: Self-pay

## 2011-03-27 NOTE — Telephone Encounter (Signed)
Patient notified as instructed by telephone. Pt said the h/a is better and she has appt tomorrow to see the eye dr.

## 2011-03-27 NOTE — Telephone Encounter (Signed)
Message copied by Lewanda Rife on Thu Mar 27, 2011  5:37 PM ------      Message from: Roxy Manns      Created: Thu Mar 27, 2011  1:32 PM       Please adv pt that labs are ok       Sed rate is normal      Renal function is improved      Please see eye doctor as we discussed and alert me if headaches do not go away or if worse at any time

## 2011-03-28 NOTE — Discharge Summary (Signed)
NAMEVENERA, Linda [name]                ACCOUNT NO.:  [id number]   MEDICAL RECORD NO.:  [id number]          PATIENT TYPE:  OBV   LOCATION:  5012                         FACILITY:  [facility]   PHYSICIAN:  Valerie A. Felicity Coyer, MDDATE OF BIRTH:  Feb 23, 1930   DATE OF ADMISSION:  11/12/2007  DATE OF DISCHARGE:  11/15/2007                               DISCHARGE SUMMARY   DISCHARGE DIAGNOSES:  1. Intractable right hip and back pain status post fall.  2. Left wrist pain status post fall.  3. Urinary tract infection.   HISTORY OF PRESENT ILLNESS:  Ms. Linda [name] is a 77 year old white female who  prior to the admission was at home with her daughter and her family who  presented to the emergency room on the day of admission after a fall  outside of the home.  The patient states she slipped on ice.  No one was  home at the time of fall and per patient's daughter she laid outside in  the cold for approximately 1 hour prior to being found by her daughter.  ER evaluation revealed negative x-ray of left shoulder, pelvis, as well  as a negative CT of the pelvis.  However, due to patient's severe  discomfort and inability to ambulate, she was admitted to the hospital  for further evaluation and pain management.   PAST MEDICAL HISTORY:  1. Osteoporosis.  2. Hypertension.  3. Hyperlipidemia.  4. Renal insufficiency.  5. Type 2 diabetes.  6. Rotator cuff surgery, August 2008.  7. Remote left shoulder surgery.  8. Neck surgery.   COURSE OF HOSPITALIZATION:  1. Intractable right hip and back pain and left wrist pain status post      fall prior to arrival.  As mentioned above, x-ray of pelvis and      left wrist was negative for acute fracture, as well as CT of pelvis      was negative for any fracture.  Patient was evaluated by physical      therapy who recommended home health PT at time of discharge.      Patient was also evaluated by orthopedics during this      hospitalization.  Dr. Charlann Boxer planned for  conservative care with a      splint to left wrist, as well as sling to left upper extremity.      Patient instructed to elevate arm at rest.  In regard to patient's      hip pain, patient is to be weightbearing as tolerated.  She is      instructed to increase her home Duragesic patch to 2 patches until      pain has resolved.  Patient also instructed to follow up with      orthopedics in 1 to 2 weeks after discharge.  2. UTI with positive urinalysis.  The patient treated with IV Ancef x4      days and transitioned to p.o. Keflex for a total of 7 days      treatment.   MEDICATIONS AT TIME OF DISCHARGE:  1. Percocet 1 to 2 tablets every 4 hours as  needed for pain.  2. Robaxin 500 mg every hour as needed for pain.  3. Fentanyl 25 mcg patch 2 patches changed every 3 days.  4. Hydrochlorothiazide 25 mg p.o. daily.  5. Lasix 40 mg p.o. p.r.n.  6. Lorazepam 0.5 mg p.o. p.r.n.  7. Zetia 10 mg p.o. daily.   LAB WORK AT TIME OF DISCHARGE:  Sodium 139, potassium 4.0, BUN 11,  creatinine 1.05.   DISPOSITION:  Patient to be discharged home with daughter for continued  home health physical therapy.  She is to follow up with Dr. Rennis Chris as  scheduled.      Cordelia Pen, NP      Raenette Rover. Felicity Coyer, MD  Electronically Signed    LE/MEDQ  D:  12/23/2007  T:  12/24/2007  Job:  [id number]   cc:   Marne A. Milinda Antis, MD

## 2011-03-28 NOTE — Op Note (Signed)
Linda [name], Linda [name]                ACCOUNT NO.:  [id number]   MEDICAL RECORD NO.:  [id number]          PATIENT TYPE:  OIB   LOCATION:  5011                         FACILITY:  [facility]   PHYSICIAN:  Vania Rea. Supple, M.D.  DATE OF BIRTH:  Jan 04, 1930   DATE OF PROCEDURE:  02/25/2007  DATE OF DISCHARGE:                               OPERATIVE REPORT   PREOPERATIVE DIAGNOSIS:  Right shoulder end stage rotator cuff tear  arthropathy.   POSTOPERATIVE DIAGNOSIS:  Right shoulder end stage rotator cuff tear  arthropathy.   PROCEDURE:  Right shoulder cuff tear arthropathy hemiarthroplasty  utilizing a DePuy global stem size 12 and a 48 x 18 CTA humeral head,  Press-Fit.   SURGEON:  Vania Rea. Supple, M.D.   Threasa HeadsFrench Ana A. Shuford, P.A.-C.   ANESTHESIA:  General endotracheal as well as a preoperative interscalene  block.   ESTIMATED BLOOD LOSS:  350 mL.   DRAINS:  None.   HISTORY:  Linda [name] is a 77 year old female who has had known right  shoulder rotator cuff tear arthropathy with progressively increasing  pain as well as functional limitations.  Examination shows positive drop  arm with clinical evidence for deficiency of the rotator cuff and  severely painful passive range of motion.  Plain radiographs confirm a  high riding humeral head as well as cuff tear arthropathy.  Due to her  ongoing pain and functional limitations, she is brought to the operating  room at this time for planned right shoulder surgery as described below.   Preoperatively, I counseled Linda [name] on treatment options as well as  risks versus benefits thereof.  Possible surgical complications  including infection, neurovascular injury, persistent loss of motion,  and possible need for revision surgery were reviewed.  She understands  and accepts and agrees with our planned procedure.   PROCEDURE IN DETAIL:  After undergoing routine preop evaluation, the  patient received prophylactic antibiotics.  An  interscalene block was  established in the holding area by the anesthesia department.  She was  placed supine on the operating table and underwent the smooth induction  of a general endotracheal anesthesia.  The right shoulder girdle region  was then sterilely prepped and draped in a standard fashion.  A  deltopectoral approach to the right shoulder was made through an 18 cm  incision over the anterolateral aspect the shoulder.  Skin flaps were  elevated.  Electrocautery was used for hemostasis.  Dissection was  carried deeply down through the multiple and thick layer of subcutaneous  fat and the deltopectoral interval was identified with the cephalic vein  retracted laterally with the deltoid and the deltopectoral interval was  then developed.  A self-retaining retractor was placed.   The upper margin of the pectoralis major tendon was then tenotomized  approximately 1 cm.  The conjoined tendon was identified, dissected  free, and retracted medially.  There were dense adhesions between the  under surface of the deltoid and the humeral head and these were  meticulously divided from anterior to posterior and adhesions between  the under surface of  the acromion and residual bursal tissue were also  divided.  The bicipital groove was then opened, the biceps tendon was  identified and then retracted.  Then, progressing intra-articularly, the  biceps was then taken from superior glenoid insertion.  There was an  obvious complete tear of the supra and infraspinatus with a very small  residual portion of the teres minor.  Almost the entire subscapularis  was intact, however.  The subscapularis was divided away from the lesser  tuberosity utilizing electrocautery and the free margin was tagged with  a series of grasping #2 FiberWire sutures.  We then completely mobilized  the subscapularis in all aspects and confirmed that it was freely mobile  with elasticity.  The subscapularis was then placed  behind the self-  retaining retractor.   At this point, the humeral head was then delivered up through the wound.  It was exposed and inspected and had a complete deficiency in the  rotator cuff superiorly and posteriorly.  Using an extramedullary guide,  an oscillating saw was then used to resect the humeral head at an angle  of approximately 30 degrees of retroversion.  We then passed an  intramedullary guide and then sequentially hand reamed up to size 12.  This showed good purchase within the canal.  We then performed  sequential broachings up to size 12.  We then applied the secondary  humeral head cutting guide to resect the residual margin of the greater  tuberosity to allow for proper seating of the CTA implant.  Once this  was completed, we then performed a trial reduction with the 48 x 18 and  then the 48 x 23 CTA heads.  The 48 x 18 had the best fit.  There was  good coverage of the proximal humerus and good mobility across the  glenoid.   The trials were then removed.  The glenoid was then inspected.  Residual  torn labrum was sharply divided away.  The joint was copiously  irrigated.  The final implant was then delivered approximately halfway  into the humerus and then the bone that had been collected from the  osteotomized humeral head was then retrieved and packed around the  proximal aspect of the humeral stem.  The stem was then terminally  impacted obtaining good stability and good purchase.  The Morris taper  was then meticulously cleaned and the 48 x 18 head was then impacted  into position.  Final reduction was performed.  Good soft tissue balance  was achieved.  The subscapularis was then repaired to the lesser  tuberosity through a series of bone tunnels with the #2 FiberWire.  We  then performed a tenodesis of the biceps tendon using the residual suture limbs from the subscapularis repair.  The shoulder was taken  through a range of motion showing good soft  tissue balance and good  mobility.  All adhesions were properly released.   The wound was then copiously irrigated.  Hemostasis was obtained.  The  deltopectoral interval was then allowed to close.  The subcu layer was  closed 2-0 Vicryl and intracuticular 3-0 Monocryl was used to close the  skin followed by Steri-Strips.  A bulky dry dressing was then applied to  the right shoulder and the right arm was placed in a sling immobilizer.  The patient was rolled supine, extubated, and taken to the recovery room  in stable condition.      Vania Rea. Supple, M.D.  Electronically Signed  KMS/MEDQ  D:  02/25/2007  T:  02/25/2007  Job:  [id number]

## 2011-03-30 ENCOUNTER — Emergency Department (HOSPITAL_COMMUNITY): Payer: Medicare Other

## 2011-03-30 ENCOUNTER — Emergency Department (HOSPITAL_COMMUNITY)
Admission: EM | Admit: 2011-03-30 | Discharge: 2011-03-31 | Disposition: A | Discharge status: Home / Self Care | Payer: Medicare Other | Attending: Emergency Medicine | Admitting: Emergency Medicine

## 2011-03-30 DIAGNOSIS — W1809XA Striking against other object with subsequent fall, initial encounter: Secondary | ICD-10-CM | POA: Insufficient documentation

## 2011-03-30 DIAGNOSIS — S335XXA Sprain of ligaments of lumbar spine, initial encounter: Secondary | ICD-10-CM | POA: Insufficient documentation

## 2011-03-30 DIAGNOSIS — S139XXA Sprain of joints and ligaments of unspecified parts of neck, initial encounter: Secondary | ICD-10-CM | POA: Insufficient documentation

## 2011-03-30 DIAGNOSIS — E119 Type 2 diabetes mellitus without complications: Secondary | ICD-10-CM | POA: Insufficient documentation

## 2011-03-30 DIAGNOSIS — S0990XA Unspecified injury of head, initial encounter: Secondary | ICD-10-CM | POA: Insufficient documentation

## 2011-03-30 DIAGNOSIS — I10 Essential (primary) hypertension: Secondary | ICD-10-CM | POA: Insufficient documentation

## 2011-03-30 DIAGNOSIS — R109 Unspecified abdominal pain: Secondary | ICD-10-CM | POA: Insufficient documentation

## 2011-03-30 DIAGNOSIS — M25469 Effusion, unspecified knee: Secondary | ICD-10-CM | POA: Insufficient documentation

## 2011-03-30 DIAGNOSIS — N390 Urinary tract infection, site not specified: Secondary | ICD-10-CM | POA: Insufficient documentation

## 2011-03-30 DIAGNOSIS — S8000XA Contusion of unspecified knee, initial encounter: Secondary | ICD-10-CM | POA: Insufficient documentation

## 2011-03-30 DIAGNOSIS — M549 Dorsalgia, unspecified: Secondary | ICD-10-CM | POA: Insufficient documentation

## 2011-03-31 ENCOUNTER — Emergency Department (HOSPITAL_COMMUNITY): Payer: Medicare Other

## 2011-03-31 LAB — URINE MICROSCOPIC-ADD ON

## 2011-03-31 LAB — URINALYSIS, ROUTINE W REFLEX MICROSCOPIC
Bilirubin Urine: NEGATIVE
Glucose, UA: NEGATIVE mg/dL
Hgb urine dipstick: NEGATIVE
Ketones, ur: NEGATIVE mg/dL
Nitrite: POSITIVE — AB
Protein, ur: NEGATIVE mg/dL
Specific Gravity, Urine: 1.017 (ref 1.005–1.030)
Urobilinogen, UA: 0.2 mg/dL (ref 0.0–1.0)
pH: 6 (ref 5.0–8.0)

## 2011-04-01 ENCOUNTER — Other Ambulatory Visit: Payer: Self-pay | Admitting: *Deleted

## 2011-04-01 MED ORDER — FLUTICASONE PROPIONATE 0.05 % EX CREA: TOPICAL_CREAM | Freq: Every day | CUTANEOUS | Status: DC | PRN

## 2011-04-01 NOTE — Telephone Encounter (Signed)
Patient notified as instructed by telephone.Medication phoned to [facility] as instructed.  

## 2011-04-01 NOTE — Telephone Encounter (Signed)
Px written for call in   

## 2011-04-08 ENCOUNTER — Ambulatory Visit (INDEPENDENT_AMBULATORY_CARE_PROVIDER_SITE_OTHER): Payer: Medicare Other | Admitting: Family Medicine

## 2011-04-08 ENCOUNTER — Encounter: Payer: Self-pay | Admitting: Family Medicine

## 2011-04-08 DIAGNOSIS — M25559 Pain in unspecified hip: Secondary | ICD-10-CM

## 2011-04-08 DIAGNOSIS — N390 Urinary tract infection, site not specified: Secondary | ICD-10-CM | POA: Insufficient documentation

## 2011-04-08 DIAGNOSIS — M479 Spondylosis, unspecified: Secondary | ICD-10-CM

## 2011-04-08 DIAGNOSIS — M25551 Pain in right hip: Secondary | ICD-10-CM | POA: Insufficient documentation

## 2011-04-08 LAB — POCT URINALYSIS DIPSTICK
Ketones, UA: NEGATIVE
Urobilinogen, UA: 0.2
pH, UA: 6

## 2011-04-08 NOTE — Progress Notes (Signed)
Patient notified as instructed by telephone that urinalysis look borderline and will send for urine culture.

## 2011-04-08 NOTE — Assessment & Plan Note (Signed)
After loosing balance/ fall  Nl x ray hip/pelvis and knee Nl Ct CS Is slowly imp I recommend using walker while on valium and percocet -- disc fall risks on these meds F/u dr Rennis Chris next week

## 2011-04-08 NOTE — Assessment & Plan Note (Addendum)
Finishing course of cipro  Only symptom was frequency Incidental finding in ER Specimen today- re check still shows 1 plus leuk Was sent for cx

## 2011-04-08 NOTE — Patient Instructions (Signed)
I think your swelling is from decrease in activity and also heat  Eat less salt and salty food and drink more water When you sit - try to elevate legs to the level of your heart to let gravity help the swelling  Follow up with orthopedics as planned  Use extreme caution with your pain medicine and valium --these can make you dizzy Please leave a urine specimen on the way out so I can make sure infection is getting better  Finish all your antibiotics

## 2011-04-08 NOTE — Progress Notes (Signed)
Subjective:    Patient ID: Linda [name], female    DOB: 1930/01/02, 77 y.o.   MRN: [id number]  HPI Pt is here for f/u after a fall (then visit to [facility] ER)  She twisted to pick up a pc of mail and then slipped and fell on her R side Had to be picked up to put in a chair with hip and flank pain  Also hit the back of her head Required iv fentanyl to get her into ambulance Hip and pelvis films in ER were fine Xray of L knee- oa but no frature  Did have uti - and gave her med for that - 2 pills left Given cipro  Gave valium and percocet for pain  Goes to ortho on the 5th  Neck ct was ok with intact fusion  Ortho in past - Dr Rennis Chris  ? Wt is up 12 lb   Still having pain on her R side of low back above hip -- and that is slowly improving  Feet and legs are really swelling  Gets up and moves- but more slowly  No sob or cp   Past Medical History  Diagnosis Date  . Allergy     allergic rhinitis  . Diabetes mellitus     type II  . Hypertension   . Osteopenia   . Renal insufficiency   . TIA (transient ischemic attack)   . Contact dermatitis     on buttocks  . Pyelonephritis   . Nosebleed     History   Social History  . Marital Status: Single    Spouse Name: N/A    Number of Children: N/A  . Years of Education: N/A   Occupational History  . Not on file.   Social History Main Topics  . Smoking status: Never Smoker   . Smokeless tobacco: Not on file  . Alcohol Use: No  . Drug Use:   . Sexually Active:    Other Topics Concern  . Not on file   Social History Narrative  . No narrative on file    Allergies  Allergen Reactions  . Amoxicillin     REACTION: aching all over  . Atorvastatin     REACTION: increased CPK  . Cephalexin     REACTION: weak , nauseated and headache  . Cetirizine Hcl     REACTION: makes her head feel big  . Codeine     REACTION: rash  . Cortisone     REACTION: increased bp  . Ezetimibe-Simvastatin   . Fentanyl     REACTION:  reaction - stinging in her skin  . Guaifenesin   . Ketorolac Tromethamine     REACTION: no work  . Lisinopril     REACTION: cough - ace related  . Metformin     REACTION: nosebleeds  . Nsaids     REACTION: nosebleeds  . Penicillins     REACTION: nausea and h/a  . Rofecoxib     REACTION: itching  . Rosuvastatin     REACTION: myalgia  . Simvastatin   . Spironolactone     REACTION: nausea             Review of Systems Review of Systems  Constitutional: Negative for fever, appetite change, fatigue and unexpected weight change.  Eyes: Negative for pain and visual disturbance.  Respiratory: Negative for cough and shortness of breath.   Cardiovascular: Negative for cp or palpitations.   Gastrointestinal: Negative for nausea, diarrhea and  constipation.  Genitourinary: Negative for urgency and frequency.  Skin: Negative for pallor. or rash MSK pos for back and buttock pain/ generalized arthritis pains , neg for swollen joints  Neurological: Negative for weakness, light-headedness, numbness and headaches.  Hematological: Negative for adenopathy. Does not bruise/bleed easily.  Psychiatric/Behavioral: Negative for dysphoric mood. The patient is not nervous/anxious.          Objective:   Physical Exam  Constitutional: She appears well-developed and well-nourished. No distress.       Obese and well appearing but in some pain with movement  HENT:  Head: Normocephalic and atraumatic.  Right Ear: External ear normal.  Left Ear: External ear normal.  Nose: Nose normal.  Mouth/Throat: Oropharynx is clear and moist.  Eyes: Conjunctivae and EOM are normal. Pupils are equal, round, and reactive to light.  Neck: Normal range of motion. Neck supple. No JVD present. No thyromegaly present.       No bony neck tenderness Nl rom  Cardiovascular: Normal rate, regular rhythm, normal heart sounds and intact distal pulses.   Pulmonary/Chest: Effort normal and breath sounds normal. No  respiratory distress. She has no wheezes.  Abdominal: Soft. Bowel sounds are normal. She exhibits no distension and no mass. There is no tenderness.       No suprapubic tenderness   No cva tenderness  Musculoskeletal: She exhibits tenderness.       Trace pedal edema  No LS bony tenderness Some R buttock/ perilumbar muscular tenderness/ spasm Bent knee raise pos for buttock pain (not leg pain)  Pt unable to get on table today  Nl rom hip  Gait favors L leg today No neurol def  Lymphadenopathy:    She has no cervical adenopathy.  Neurological: She is alert. She has normal strength and normal reflexes. No sensory deficit. Coordination normal.  Skin: Skin is warm and dry. No rash noted. No erythema. No pallor.  Psychiatric: She has a normal mood and affect.          Assessment & Plan:

## 2011-04-11 ENCOUNTER — Telehealth: Payer: Self-pay

## 2011-04-11 NOTE — Telephone Encounter (Signed)
Message copied by Patience Musca on Fri Apr 11, 2011  4:26 PM ------      Message from: Roxy Manns A      Created: Thu Apr 10, 2011  4:21 PM       Please finish course of cipro-- urine cx better with insignificant growth       Update me if symptoms do not continue to improve

## 2011-04-11 NOTE — Telephone Encounter (Signed)
Patient notified as instructed by telephone. Pt has finished antibiotic and is having some back pain on right side but pt thinks this is coming from her fall and has appt to see Dr Ethelene Hal on 04/25/11. If develops any further symptoms will call back.

## 2011-06-06 ENCOUNTER — Ambulatory Visit (INDEPENDENT_AMBULATORY_CARE_PROVIDER_SITE_OTHER): Payer: Medicare Other | Admitting: Family Medicine

## 2011-06-06 ENCOUNTER — Encounter: Payer: Self-pay | Admitting: Family Medicine

## 2011-06-06 DIAGNOSIS — F411 Generalized anxiety disorder: Secondary | ICD-10-CM

## 2011-06-06 DIAGNOSIS — F419 Anxiety disorder, unspecified: Secondary | ICD-10-CM

## 2011-06-06 DIAGNOSIS — W57XXXA Bitten or stung by nonvenomous insect and other nonvenomous arthropods, initial encounter: Secondary | ICD-10-CM | POA: Insufficient documentation

## 2011-06-06 DIAGNOSIS — IMO0001 Reserved for inherently not codable concepts without codable children: Secondary | ICD-10-CM

## 2011-06-06 DIAGNOSIS — M479 Spondylosis, unspecified: Secondary | ICD-10-CM

## 2011-06-06 MED ORDER — DOXYCYCLINE HYCLATE 100 MG PO TABS: 100.0000 mg | ORAL_TABLET | Freq: Two times a day (BID) | ORAL | Status: AC

## 2011-06-06 NOTE — Progress Notes (Signed)
Subjective:    Patient ID: Linda [name], female    DOB: 03/05/1930, 77 y.o.   MRN: [id number]  HPI  Got a tick bite when working in flower garden Yesterday arm was itching Her sister removed it and she cannot see it well  Is red and very itchy   No fever  Feeling bad in general  Has a headache -- one fleeting pain -- up side of head on R one time  No sore throat or rash    Her anxiety is not good lately  Daughter has fibromyalgia and no insurance- really worries about that  Has to live with her - that is not a good situation Declines counseling at this time Thinks her anxiety may make her feel sick at times   Patient Active Problem List  Diagnoses  . GOITER, MULTINODULAR  . DIABETES MELLITUS, TYPE II  . HYPERCHOLESTEROLEMIA  . OBESITY  . HYPERTENSION  . VENOUS INSUFFICIENCY, LEGS  . ALLERGIC RHINITIS  . RENAL INSUFFICIENCY  . UTI'S, CHRONIC  . SPONDYLOSIS  . DEGENERATIVE DISC DISEASE, CERVICAL SPINE  . ROTATOR CUFF TEAR  . OSTEOPENIA  . EDEMA  . TRANSIENT ISCHEMIC ATTACK, HX OF  . ADVEF, DRUG/MEDICINAL/BIOLOGICAL SUBST NOS  . Headache  . Dizziness  . Right hip pain  . UTI (lower urinary tract infection)  . Tick bite   Past Medical History  Diagnosis Date  . Allergy     allergic rhinitis  . Diabetes mellitus     type II  . Hypertension   . Osteopenia   . Renal insufficiency   . TIA (transient ischemic attack)   . Contact dermatitis     on buttocks  . Pyelonephritis   . Nosebleed    Past Surgical History  Procedure Date  . Rotator cuff repair 06/2007    right  . Abdominal hysterectomy   . Back surgery 03/1999    disk  . Breast surgery     breast biopsy x2 negative  . Shoulder surgery 2007    left  . Joint replacement     Rt knee patella replacement   History  Substance Use Topics  . Smoking status: Never Smoker   . Smokeless tobacco: Not on file  . Alcohol Use: No   Family History  Problem Relation Age of Onset  . Stroke Mother   .  Stroke Father    Allergies  Allergen Reactions  . Amoxicillin     REACTION: aching all over  . Atorvastatin     REACTION: increased CPK  . Cephalexin     REACTION: weak , nauseated and headache  . Cetirizine Hcl     REACTION: makes her head feel big  . Codeine     REACTION: rash  . Cortisone     REACTION: increased bp  . Ezetimibe-Simvastatin   . Fentanyl     REACTION: reaction - stinging in her skin  . Guaifenesin   . Ketorolac Tromethamine     REACTION: no work  . Lisinopril     REACTION: cough - ace related  . Metformin     REACTION: nosebleeds  . Nsaids     REACTION: nosebleeds  . Penicillins     REACTION: nausea and h/a  . Rofecoxib     REACTION: itching  . Rosuvastatin     REACTION: myalgia  . Simvastatin   . Spironolactone     REACTION: nausea   Current Outpatient Prescriptions on File Prior to Visit  Medication  Sig Dispense Refill  . acetaminophen (TYLENOL ARTHRITIS PAIN) 650 MG CR tablet Take 650 mg by mouth every 8 (eight) hours as needed.        Marland Kitchen alendronate (FOSAMAX) 70 MG tablet Take 70 mg by mouth every 7 (seven) days. Take with a full glass of water on an empty stomach.       Marland Kitchen aspirin EC 81 MG tablet Take 81 mg by mouth daily.        . Calcium Carbonate-Vitamin D 600-400 MG-UNIT per tablet Take 1 tablet by mouth daily.        . Cinnamon 500 MG capsule Take 500 mg by mouth 2 (two) times daily.        . fluticasone (CUTIVATE) 0.05 % cream Apply topically daily as needed.  30 g  0  . fluticasone (FLONASE) 50 MCG/ACT nasal spray 2 sprays by Nasal route daily.        Marland Kitchen glucose blood test strip Ck glucose three times daily and as needed.       Marland Kitchen LORazepam (ATIVAN) 0.5 MG tablet One daily as needed for anxiety   30 tablet  0  . losartan (COZAAR) 25 MG tablet Take 25 mg by mouth daily.        Marland Kitchen Lysine 500 MG TABS Take 1 tablet by mouth daily.        . Multiple Vitamin (MULTIVITAMIN) capsule Take 1 capsule by mouth daily.        . Omega-3 Fatty Acids (FISH  OIL PO) Take 1 capsule by mouth 2 (two) times daily.        . carboxymethylcellulose (REFRESH PLUS) 0.5 % SOLN Place 1 drop into both eyes daily as needed.        . ciprofloxacin (CIPRO) 250 MG tablet Take 500 mg by mouth 2 (two) times daily.        . cyclobenzaprine (FLEXERIL) 10 MG tablet 1/2- 1 tablet by mouth three times a day as needed.       . diazepam (VALIUM) 5 MG tablet Take 5 mg by mouth every 6 (six) hours as needed.        . diclofenac sodium (VOLTAREN) 1 % GEL Apply 1 application topically 4 (four) times daily.        . furosemide (LASIX) 40 MG tablet Take 40 mg by mouth daily as needed. Is from Dr Patty Sermons      . ketotifen (ZADITOR) 0.025 % ophthalmic solution Place 1 drop into both eyes daily.        Marland Kitchen oxyCODONE-acetaminophen (PERCOCET) 10-325 MG per tablet Take 1 tablet by mouth every 6 (six) hours as needed.        . Potassium 75 MG TABS Take 1 tablet by mouth daily as needed.        . temazepam (RESTORIL) 30 MG capsule Take 30 mg by mouth at bedtime as needed.           Review of Systems Review of Systems  Constitutional: Negative for fever, appetite change, and unexpected weight change. Pos for fatigue and gen malaise Eyes: Negative for pain and visual disturbance.  Respiratory: Negative for cough and shortness of breath.   Cardiovascular: Negative.  for cp or palpitations  Gastrointestinal: Negative for nausea, diarrhea and constipation.  Genitourinary: Negative for urgency and frequency.  Skin: Negative for pallor. pos for tick bite lesion / neg for overall rash  Neurological: Negative for weakness, light-headedness, numbness Hematological: Negative for adenopathy. Does not bruise/bleed easily.  Psychiatric/Behavioral: Negative for dysphoric mood. Pos for much anxiety from stressors, no SI           Objective:   Physical Exam  Constitutional: She appears well-developed and well-nourished. No distress.       Obese and well appearing   HENT:  Head: Normocephalic  and atraumatic.  Right Ear: External ear normal.  Left Ear: External ear normal.  Nose: Nose normal.  Mouth/Throat: Oropharynx is clear and moist.  Eyes: Conjunctivae and EOM are normal. Pupils are equal, round, and reactive to light.  Neck: Normal range of motion. Neck supple. No JVD present. Carotid bruit is not present. No thyromegaly present.  Cardiovascular: Normal rate, regular rhythm, normal heart sounds and intact distal pulses.   Pulmonary/Chest: Effort normal and breath sounds normal. No respiratory distress. She has no wheezes.  Abdominal: Soft. Bowel sounds are normal. She exhibits no distension and no mass. There is no tenderness.  Musculoskeletal: She exhibits no tenderness.       No acute joint changes  Pt has OA diffusely  Lymphadenopathy:    She has no cervical adenopathy.  Neurological: She is alert. She has normal reflexes. No cranial nerve deficit. Coordination normal.  Skin: Skin is warm and dry. No rash noted. No pallor.       Left upper outer arm area - tick bite 2-3 cm in diameter oval with target formation of color - red and pale  No tick remains  Psychiatric:       Seems generally anxious  Not tearful Nl eye contact and comm skills          Assessment & Plan:   No problem-specific assessment & plan notes found for this encounter.

## 2011-06-06 NOTE — Patient Instructions (Signed)
Take the doxycycline twice daily with food (not dairy)  This is to treat or prevent tick fever  You can use any cortisone cream or cort aid over the counter for itching If worse symptoms or high fever or rash- update me  Follow up in about a week

## 2011-06-06 NOTE — Assessment & Plan Note (Signed)
With classic erythema migrans appearing bite site (target shape)  Suspect was deer tick  Will tx proph for lyme with doxycycline 100 bid  F/u 1 week and will likely do labs Has some malaise but if fever or rash or return of headache she will update me

## 2011-06-08 DIAGNOSIS — F419 Anxiety disorder, unspecified: Secondary | ICD-10-CM | POA: Insufficient documentation
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
## 2011-06-13 ENCOUNTER — Encounter: Payer: Self-pay | Admitting: Family Medicine

## 2011-06-13 ENCOUNTER — Ambulatory Visit (INDEPENDENT_AMBULATORY_CARE_PROVIDER_SITE_OTHER): Payer: Medicare Other | Admitting: Family Medicine

## 2011-06-13 DIAGNOSIS — R51 Headache: Secondary | ICD-10-CM

## 2011-06-13 DIAGNOSIS — T148XXA Other injury of unspecified body region, initial encounter: Secondary | ICD-10-CM

## 2011-06-13 DIAGNOSIS — W57XXXA Bitten or stung by nonvenomous insect and other nonvenomous arthropods, initial encounter: Secondary | ICD-10-CM

## 2011-06-13 NOTE — Patient Instructions (Signed)
Finish your antibiotic Drink fluids  Please send for [place] [facility] ER records/labs/ scans -- when ready -- from visit in July  We will check labs for sed rate and update you with a plan  Tick bite looks much better I'm glad you are feeling better

## 2011-06-13 NOTE — Assessment & Plan Note (Signed)
Ongoing  Will review [facility] records and labs from last month Also check sed rate (though no tenderness or vision problems today) Then make plan  Tick bite improved

## 2011-06-13 NOTE — Progress Notes (Signed)
Subjective:    Patient ID: Linda [name], female    DOB: 1930-11-06, 77 y.o.   MRN: [id number]  HPI Here for f/u of tick bite and assoc symptoms Last visit had bite site with target shaped erythema migrans and was tx with bid doxycycline   Per pt tick bite site is still a little red but less so and not hurting   No fever  Still had headaches - but not continuous now (was having those before she had the tick bite)  No joint swelling or redness  No new pains in general   Headache is in R side of head in temple area  Had a fall and hit head --but headaches started before that  Describes as a shooting pain  No vision change or new problems  Never really had headaches in the past  Going on about 2 months   Wt is stable  Last visit disc lot of stress Still very high at times   The doxycycline makes her face feel hot  Has 3-4 d left   Patient Active Problem List  Diagnoses  . GOITER, MULTINODULAR  . DIABETES MELLITUS, TYPE II  . HYPERCHOLESTEROLEMIA  . OBESITY  . HYPERTENSION  . VENOUS INSUFFICIENCY, LEGS  . ALLERGIC RHINITIS  . RENAL INSUFFICIENCY  . UTI'S, CHRONIC  . SPONDYLOSIS  . DEGENERATIVE DISC DISEASE, CERVICAL SPINE  . ROTATOR CUFF TEAR  . OSTEOPENIA  . EDEMA  . TRANSIENT ISCHEMIC ATTACK, HX OF  . ADVEF, DRUG/MEDICINAL/BIOLOGICAL SUBST NOS  . Headache  . Right hip pain  . Tick bite  . Anxiety   Past Medical History  Diagnosis Date  . Allergy     allergic rhinitis  . Diabetes mellitus     type II  . Hypertension   . Osteopenia   . Renal insufficiency   . TIA (transient ischemic attack)   . Contact dermatitis     on buttocks  . Pyelonephritis   . Nosebleed    Past Surgical History  Procedure Date  . Rotator cuff repair 06/2007    right  . Abdominal hysterectomy   . Back surgery 03/1999    disk  . Breast surgery     breast biopsy x2 negative  . Shoulder surgery 2007    left  . Joint replacement     Rt knee patella replacement   History   Substance Use Topics  . Smoking status: Never Smoker   . Smokeless tobacco: Not on file  . Alcohol Use: No   Family History  Problem Relation Age of Onset  . Stroke Mother   . Stroke Father    Allergies  Allergen Reactions  . Amoxicillin     REACTION: aching all over  . Atorvastatin     REACTION: increased CPK  . Cephalexin     REACTION: weak , nauseated and headache  . Cetirizine Hcl     REACTION: makes her head feel big  . Codeine     REACTION: rash  . Cortisone     REACTION: increased bp  . Ezetimibe-Simvastatin   . Fentanyl     REACTION: reaction - stinging in her skin  . Guaifenesin   . Ketorolac Tromethamine     REACTION: no work  . Lisinopril     REACTION: cough - ace related  . Metformin     REACTION: nosebleeds  . Nsaids     REACTION: nosebleeds  . Penicillins     REACTION: nausea and h/a  .  Rofecoxib     REACTION: itching  . Rosuvastatin     REACTION: myalgia  . Simvastatin   . Spironolactone     REACTION: nausea   Current Outpatient Prescriptions on File Prior to Visit  Medication Sig Dispense Refill  . acetaminophen (TYLENOL ARTHRITIS PAIN) 650 MG CR tablet Take 650 mg by mouth every 8 (eight) hours as needed.        Marland Kitchen alendronate (FOSAMAX) 70 MG tablet Take 70 mg by mouth every 7 (seven) days. Take with a full glass of water on an empty stomach.       Marland Kitchen aspirin EC 81 MG tablet Take 81 mg by mouth daily.        . Calcium Carbonate-Vitamin D 600-400 MG-UNIT per tablet Take 1 tablet by mouth daily.        . Cinnamon 500 MG capsule Take 500 mg by mouth 2 (two) times daily.        Marland Kitchen doxycycline (VIBRA-TABS) 100 MG tablet Take 1 tablet (100 mg total) by mouth 2 (two) times daily.  28 tablet  0  . fluticasone (CUTIVATE) 0.05 % cream Apply topically daily as needed.  30 g  0  . glucose blood test strip Ck glucose three times daily and as needed.       Marland Kitchen LORazepam (ATIVAN) 0.5 MG tablet One daily as needed for anxiety   30 tablet  0  . losartan (COZAAR)  25 MG tablet Take 25 mg by mouth daily.        Marland Kitchen Lysine 500 MG TABS Take 1 tablet by mouth daily.        . Multiple Vitamin (MULTIVITAMIN) capsule Take 1 capsule by mouth daily.        . Omega-3 Fatty Acids (FISH OIL PO) Take 1 capsule by mouth 2 (two) times daily.        . carboxymethylcellulose (REFRESH PLUS) 0.5 % SOLN Place 1 drop into both eyes daily as needed.        . cyclobenzaprine (FLEXERIL) 10 MG tablet 1/2- 1 tablet by mouth three times a day as needed.       . diclofenac sodium (VOLTAREN) 1 % GEL Apply 1 application topically 4 (four) times daily.        . fluticasone (FLONASE) 50 MCG/ACT nasal spray 2 sprays by Nasal route daily.        . furosemide (LASIX) 40 MG tablet Take 40 mg by mouth daily as needed. Is from Dr Patty Sermons      . ketotifen (ZADITOR) 0.025 % ophthalmic solution Place 1 drop into both eyes daily.        . Potassium 75 MG TABS Take 1 tablet by mouth daily as needed.           Review of Systems Review of Systems  Constitutional: Negative for fever, appetite change, fatigue and unexpected weight change.  Eyes: Negative for pain and visual disturbance.  Respiratory: Negative for cough and shortness of breath.   Cardiovascular: Negative.  for cp or palpitations  Gastrointestinal: Negative for nausea, diarrhea and constipation.  Genitourinary: Negative for urgency and frequency.  Skin: Negative for pallor. or rash / tick bite site is much better  Neurological: Negative for weakness, light-headedness, numbness and pos for headaches Hematological: Negative for adenopathy. Does not bruise/bleed easily.  Psychiatric/Behavioral: Negative for dysphoric mood. The patient is not nervous/anxious.          Objective:   Physical Exam  Constitutional: She appears well-developed  and well-nourished. No distress.       overwt and well appearing   HENT:  Head: Normocephalic and atraumatic.  Right Ear: External ear normal.  Left Ear: External ear normal.  Nose: Nose  normal.  Mouth/Throat: Oropharynx is clear and moist.       No temporal tenderness  Eyes: Conjunctivae and EOM are normal. Pupils are equal, round, and reactive to light.  Neck: Normal range of motion. Neck supple. No JVD present. No thyromegaly present.  Cardiovascular: Normal rate, regular rhythm, normal heart sounds and intact distal pulses.   Pulmonary/Chest: Effort normal and breath sounds normal. No respiratory distress.  Abdominal: Soft. Bowel sounds are normal. She exhibits no distension. There is no tenderness.  Musculoskeletal: She exhibits no edema.       No acute joint changes   Lymphadenopathy:    She has no cervical adenopathy.  Neurological: She is alert. She has normal strength and normal reflexes. No cranial nerve deficit or sensory deficit. She displays a negative Romberg sign. Coordination and gait normal.  Skin: Skin is warm and dry. No rash noted. No pallor.       Very faint erythema at site of tick bite on L upper arm  Almost undetectable   Psychiatric: She has a normal mood and affect.          Assessment & Plan:

## 2011-06-13 NOTE — Assessment & Plan Note (Signed)
Much improved No constitutional symptoms - except headache which happened before that  Will finish doxy and update if any tick rel symptoms

## 2011-07-02 ENCOUNTER — Encounter: Payer: Self-pay | Admitting: Family Medicine

## 2011-07-03 ENCOUNTER — Encounter: Payer: Self-pay | Admitting: *Deleted

## 2011-07-07 ENCOUNTER — Ambulatory Visit: Payer: Medicare Other | Admitting: Family Medicine

## 2011-07-07 ENCOUNTER — Telehealth: Payer: Self-pay

## 2011-07-07 DIAGNOSIS — R51 Headache: Secondary | ICD-10-CM

## 2011-07-07 NOTE — Telephone Encounter (Signed)
Message copied by Patience Musca on Mon Jul 07, 2011  5:12 PM ------      Message from: Roxy Manns A      Created: Mon Jul 07, 2011  8:25 AM       At her last visit she had a headache and I was trying to get [facility] records from July-- and I only see records from may      Please ask her how she is feeling and if she did go to ER in July or if may was the last time--thanks

## 2011-07-07 NOTE — Telephone Encounter (Signed)
Was unable to reach pt but I did speak with Linda [name] and Linda [name] will ck with her mother tonight and confirm dates of when seen at [facility] and Linda [name] or pt will call back on 07/08/11 with info. I asked how pt was doing and Linda [name] said she was concerned about pt still having h/as and nausea, but Linda [name] said to wait and let her ask her mother how she feels she is doing.

## 2011-07-08 NOTE — Telephone Encounter (Signed)
Pt called back and the last time she was seen in ER was 03/30/11. Pt said today and since seen pt has headaches on and off with pain on both sides of head and today pain also across forehead. Pt feels nauseated and very tired today also.Please advise.. Pt said she would be at home today and can call back at [phone].

## 2011-07-08 NOTE — Telephone Encounter (Signed)
Tell her I reviewed her hospital notes again and it looks like they did not do a head CT at her visit in may because she did not complain of a headache at that time For that reason I would like to order a non contrast head CT to look at both brain and sinuses and then have her fu to discuss a plan  Ask if agreeable to this and I will order it

## 2011-07-08 NOTE — Telephone Encounter (Signed)
Patient notified as instructed by telephone. Pt is agreeable to have CT of head and will wait to hear from pt care coordinator about appt. Pt can be reached at [phone]. Pt will schedule f/u appt with Dr Milinda Antis after she finds out when CT appt is.

## 2011-07-08 NOTE — Telephone Encounter (Signed)
Copy of May ER visit is on your shelf in the in box also.

## 2011-07-08 NOTE — Telephone Encounter (Signed)
I will order the CT of head  Please have her f/u with me about 7-10 days later

## 2011-07-11 ENCOUNTER — Ambulatory Visit (INDEPENDENT_AMBULATORY_CARE_PROVIDER_SITE_OTHER)
Admission: RE | Admit: 2011-07-11 | Discharge: 2011-07-11 | Disposition: A | Discharge status: Home / Self Care | Payer: Medicare Other | Source: Ambulatory Visit | Attending: Family Medicine | Admitting: Family Medicine

## 2011-07-11 ENCOUNTER — Ambulatory Visit: Payer: Medicare Other | Admitting: Family Medicine

## 2011-07-11 DIAGNOSIS — R51 Headache: Secondary | ICD-10-CM

## 2011-07-15 NOTE — Telephone Encounter (Signed)
Patient advised, has appt tomorrow with Dr. Milinda Antis at 9:15.

## 2011-07-16 ENCOUNTER — Encounter: Payer: Self-pay | Admitting: Family Medicine

## 2011-07-16 ENCOUNTER — Ambulatory Visit (INDEPENDENT_AMBULATORY_CARE_PROVIDER_SITE_OTHER): Payer: Medicare Other | Admitting: Family Medicine

## 2011-07-16 DIAGNOSIS — N259 Disorder resulting from impaired renal tubular function, unspecified: Secondary | ICD-10-CM

## 2011-07-16 DIAGNOSIS — R51 Headache: Secondary | ICD-10-CM

## 2011-07-16 DIAGNOSIS — F430 Acute stress reaction: Secondary | ICD-10-CM
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
## 2011-07-16 NOTE — Patient Instructions (Signed)
I think your headaches are caused by stress Your CT scan and exam are reassuring  Tylenol 1-2 times per week is ok  We will do a counseling referral at check out -- so you can work through some of these stress issues to get rid of the headache If then - you still have headaches - we will likely evaluate further with a neurologis
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
## 2011-07-16 NOTE — Assessment & Plan Note (Signed)
Do not feel comfortable doing imaging with contrast at this time due to this with baseline cr 1.2

## 2011-07-16 NOTE — Assessment & Plan Note (Signed)
After a fairly extensive review of symptoms and timing as well as CT and labs I think (and pt agrees) that headaches are due to stress (tension type) and that a change in the stressors or counseling will be the most helpful  Coping skills need work - but no outward signs of depression Will ref to counseling  If pt worsens or does NOT have resolution of headache- then will proceed with neurology referral

## 2011-07-29 ENCOUNTER — Ambulatory Visit: Payer: Medicare Other | Admitting: Psychology

## 2011-07-30 ENCOUNTER — Other Ambulatory Visit: Payer: Self-pay | Admitting: *Deleted

## 2011-07-30 MED ORDER — BENZONATATE 200 MG PO CAPS: 200.0000 mg | ORAL_CAPSULE | Freq: Three times a day (TID) | ORAL | Status: DC | PRN

## 2011-07-30 NOTE — Telephone Encounter (Signed)
Received faxed refill request, medication was not on med list.  Please advise. 

## 2011-07-30 NOTE — Telephone Encounter (Signed)
Will refill electronically  

## 2011-07-31 LAB — BASIC METABOLIC PANEL
BUN: 11
Chloride: 101
Creatinine, Ser: 1.05
Glucose, Bld: 124 — ABNORMAL HIGH

## 2011-07-31 LAB — COMPREHENSIVE METABOLIC PANEL
BUN: 17
CO2: 24
Chloride: 107
Creatinine, Ser: 1.04
GFR calc non Af Amer: 52 — ABNORMAL LOW
Total Bilirubin: 0.6

## 2011-07-31 LAB — TYPE AND SCREEN
ABO/RH(D): A NEG
Antibody Screen: NEGATIVE

## 2011-07-31 LAB — URIC ACID: Uric Acid, Serum: 6.5

## 2011-07-31 LAB — URINE MICROSCOPIC-ADD ON

## 2011-07-31 LAB — CBC
HCT: 39.8
MCV: 87
Platelets: 336
RBC: 4.57
WBC: 11.8 — ABNORMAL HIGH

## 2011-07-31 LAB — URINALYSIS, ROUTINE W REFLEX MICROSCOPIC
Glucose, UA: NEGATIVE
Hgb urine dipstick: NEGATIVE
Ketones, ur: NEGATIVE
Protein, ur: NEGATIVE

## 2011-07-31 LAB — TROPONIN I: Troponin I: 0.02

## 2011-07-31 LAB — DIFFERENTIAL
Basophils Absolute: 0.1
Lymphocytes Relative: 16
Neutro Abs: 9 — ABNORMAL HIGH
Neutrophils Relative %: 77

## 2011-07-31 LAB — CK TOTAL AND CKMB (NOT AT ARMC): Total CK: 159

## 2011-07-31 LAB — PROTIME-INR: Prothrombin Time: 13.3

## 2011-08-05 ENCOUNTER — Ambulatory Visit (INDEPENDENT_AMBULATORY_CARE_PROVIDER_SITE_OTHER): Payer: Medicare Other | Admitting: Psychology

## 2011-08-05 DIAGNOSIS — F4323 Adjustment disorder with mixed anxiety and depressed mood: Secondary | ICD-10-CM

## 2011-08-13 ENCOUNTER — Ambulatory Visit: Payer: Medicare Other | Admitting: Psychology

## 2011-08-15 ENCOUNTER — Other Ambulatory Visit: Payer: Self-pay | Admitting: *Deleted

## 2011-08-15 ENCOUNTER — Other Ambulatory Visit: Payer: Self-pay

## 2011-08-15 MED ORDER — FLUTICASONE PROPIONATE 50 MCG/ACT NA SUSP: 2.0000 | Freq: Every day | NASAL | Status: DC

## 2011-08-15 MED ORDER — FLUTICASONE PROPIONATE 0.05 % EX CREA: TOPICAL_CREAM | Freq: Every day | CUTANEOUS | Status: DC | PRN

## 2011-08-15 NOTE — Telephone Encounter (Signed)
Patient walked in asking for refill of flonase, per dr. Milinda Antis ok

## 2011-08-19 LAB — URINE MICROSCOPIC-ADD ON

## 2011-08-19 LAB — URINALYSIS, ROUTINE W REFLEX MICROSCOPIC
Glucose, UA: NEGATIVE
Hgb urine dipstick: NEGATIVE
Specific Gravity, Urine: 1.018
pH: 6

## 2011-08-19 LAB — DIFFERENTIAL
Lymphocytes Relative: 4 — ABNORMAL LOW
Lymphs Abs: 0.4 — ABNORMAL LOW
Monocytes Absolute: 0.3
Monocytes Relative: 3
Neutro Abs: 10.6 — ABNORMAL HIGH
Neutrophils Relative %: 94 — ABNORMAL HIGH

## 2011-08-19 LAB — URINE CULTURE

## 2011-08-19 LAB — CBC
HCT: 38
MCHC: 33.2
MCV: 87.6
Platelets: 274
RDW: 13.8

## 2011-08-19 LAB — COMPREHENSIVE METABOLIC PANEL
Albumin: 3.6
BUN: 20
Calcium: 8.8
Creatinine, Ser: 1.07
Potassium: 3.9
Total Protein: 6.3

## 2011-08-21 LAB — HM DIABETES EYE EXAM

## 2011-08-26 LAB — CBC
HCT: 37.8
MCV: 85.4
Platelets: 319
RDW: 13.9

## 2011-08-26 LAB — URINE CULTURE
Colony Count: NO GROWTH
Culture: NO GROWTH

## 2011-08-26 LAB — URINALYSIS, ROUTINE W REFLEX MICROSCOPIC
Bilirubin Urine: NEGATIVE
Ketones, ur: NEGATIVE
Nitrite: POSITIVE — AB
Protein, ur: NEGATIVE
Urobilinogen, UA: 0.2
pH: 6

## 2011-08-26 LAB — BODY FLUID CULTURE: Culture: NO GROWTH

## 2011-08-26 LAB — COMPREHENSIVE METABOLIC PANEL
Albumin: 3.6
BUN: 16
Chloride: 105
Creatinine, Ser: 0.96
Glucose, Bld: 149 — ABNORMAL HIGH
Total Bilirubin: 0.7
Total Protein: 6.5

## 2011-08-26 LAB — PROTIME-INR: INR: 1.1

## 2011-08-26 LAB — APTT: aPTT: 23 — ABNORMAL LOW

## 2011-08-26 LAB — ANAEROBIC CULTURE: Gram Stain: NONE SEEN

## 2011-08-26 LAB — URINE MICROSCOPIC-ADD ON

## 2011-08-26 LAB — DIFFERENTIAL
Basophils Absolute: 0
Lymphocytes Relative: 27
Monocytes Absolute: 0.5
Neutro Abs: 6.5
Neutrophils Relative %: 65

## 2011-09-16 ENCOUNTER — Encounter: Payer: Self-pay | Admitting: Family Medicine

## 2011-09-16 ENCOUNTER — Ambulatory Visit (INDEPENDENT_AMBULATORY_CARE_PROVIDER_SITE_OTHER): Payer: Medicare Other | Admitting: Family Medicine

## 2011-09-16 VITALS — BP 124/74 | HR 84 | Temp 97.8°F | Ht 64.0 in | Wt 237.8 lb

## 2011-09-16 DIAGNOSIS — R51 Headache: Secondary | ICD-10-CM

## 2011-09-16 DIAGNOSIS — I10 Essential (primary) hypertension: Secondary | ICD-10-CM

## 2011-09-16 DIAGNOSIS — F419 Anxiety disorder, unspecified: Secondary | ICD-10-CM

## 2011-09-16 DIAGNOSIS — F411 Generalized anxiety disorder: Secondary | ICD-10-CM

## 2011-09-16 MED ORDER — ALENDRONATE SODIUM 70 MG PO TABS: 70.0000 mg | ORAL_TABLET | ORAL | Status: DC

## 2011-09-16 MED ORDER — LORAZEPAM 0.5 MG PO TABS: ORAL_TABLET | ORAL | Status: DC

## 2011-09-16 NOTE — Progress Notes (Signed)
Subjective:    Patient ID: Linda [name], female    DOB: [birth year], 77 y.o.   MRN: [id number]  HPI Here for f/u of HTN  Claims bp was very high at her eye surgeon's office last week with systolic of 200 Felt hot but not too anxious - was having a cataract removed from L eye  It was a successful procedure - and she never felt any pain  Will have other one done later this week - and knows what to expect  Still keeps a headache on and off   Lot of stress - caught a toxic family member "running around" She has choosen not to deal with him any more  Feels more free now - has put herself in a better place    Is in good spirits today    Today 124/74 Has been fine at recent visits On cozaar and lasix   Chemistry      Component Value Date/Time   NA 142 03/25/2011 1143   K 5.0 03/25/2011 1143   CL 105 03/25/2011 1143   CO2 28 03/25/2011 1143   BUN 25* 03/25/2011 1143   CREATININE 1.2 03/25/2011 1143      Component Value Date/Time   CALCIUM 10.2 03/25/2011 1143   ALKPHOS 53 10/09/2010 1520   AST 20 10/09/2010 1520   ALT 18 10/09/2010 1520   BILITOT 0.6 10/09/2010 1520     does have hx of mild renal insuff  Wt is up 8 lb since last visit  Stress - is overall improved She talked in detail about family events Is seeing a counselor now -that is helpful   Patient Active Problem List  Diagnoses  . GOITER, MULTINODULAR  . DIABETES MELLITUS, TYPE II  . HYPERCHOLESTEROLEMIA  . OBESITY  . HYPERTENSION  . VENOUS INSUFFICIENCY, LEGS  . ALLERGIC RHINITIS  . RENAL INSUFFICIENCY  . UTI'S, CHRONIC  . SPONDYLOSIS  . DEGENERATIVE DISC DISEASE, CERVICAL SPINE  . ROTATOR CUFF TEAR  . OSTEOPENIA  . EDEMA  . TRANSIENT ISCHEMIC ATTACK, HX OF  . ADVEF, DRUG/MEDICINAL/BIOLOGICAL SUBST NOS  . Headache  . Right hip pain  . Tick bite  . Anxiety  . Stress reaction   Past Medical History  Diagnosis Date  . Allergy     allergic rhinitis  . Diabetes mellitus     type II  . Hypertension    . Osteopenia   . Renal insufficiency   . TIA (transient ischemic attack)   . Contact dermatitis     on buttocks  . Pyelonephritis   . Nosebleed    Past Surgical History  Procedure Date  . Rotator cuff repair 06/2007    right  . Abdominal hysterectomy   . Back surgery 03/1999    disk  . Breast surgery     breast biopsy x2 negative  . Shoulder surgery 2007    left  . Joint replacement     Rt knee patella replacement   History  Substance Use Topics  . Smoking status: Never Smoker   . Smokeless tobacco: Not on file  . Alcohol Use: No   Family History  Problem Relation Age of Onset  . Stroke Mother   . Stroke Father    Allergies  Allergen Reactions  . Amoxicillin     REACTION: aching all over  . Atorvastatin     REACTION: increased CPK  . Cephalexin     REACTION: weak , nauseated and headache  . Cetirizine Hcl  REACTION: makes her head feel big  . Codeine     REACTION: rash  . Cortisone     REACTION: increased bp  . Ezetimibe-Simvastatin   . Fentanyl     REACTION: reaction - stinging in her skin  . Guaifenesin   . Ketorolac Tromethamine     REACTION: no work  . Lisinopril     REACTION: cough - ace related  . Metformin     REACTION: nosebleeds  . Nsaids     REACTION: nosebleeds  . Penicillins     REACTION: nausea and h/a  . Rofecoxib     REACTION: itching  . Rosuvastatin     REACTION: myalgia  . Simvastatin   . Spironolactone     REACTION: nausea   Current Outpatient Prescriptions on File Prior to Visit  Medication Sig Dispense Refill  . aspirin EC 81 MG tablet Take 81 mg by mouth daily.        . benzonatate (TESSALON) 200 MG capsule Take 1 capsule (200 mg total) by mouth 3 (three) times daily as needed for cough.  30 capsule  0  . Calcium Carbonate-Vitamin D 600-400 MG-UNIT per tablet Take 1 tablet by mouth daily.        . carboxymethylcellulose (REFRESH PLUS) 0.5 % SOLN Place 1 drop into both eyes daily as needed.        . Cinnamon 500 MG  capsule Take 500 mg by mouth 2 (two) times daily.        . diclofenac sodium (VOLTAREN) 1 % GEL Apply 1 application topically 4 (four) times daily.        . fluticasone (CUTIVATE) 0.05 % cream Apply topically daily as needed.  30 g  0  . fluticasone (FLONASE) 50 MCG/ACT nasal spray Place 2 sprays into the nose daily.  16 g  6  . furosemide (LASIX) 40 MG tablet Take 40 mg by mouth daily as needed. Is from Dr Patty Sermons      . glucose blood test strip Ck glucose three times daily and as needed.       Marland Kitchen ketotifen (ZADITOR) 0.025 % ophthalmic solution Place 1 drop into both eyes daily.        Marland Kitchen losartan (COZAAR) 25 MG tablet Take 25 mg by mouth daily.        Marland Kitchen Lysine 500 MG TABS Take 1 tablet by mouth daily.        . Multiple Vitamin (MULTIVITAMIN) capsule Take 1 capsule by mouth daily.        . Omega-3 Fatty Acids (FISH OIL PO) Take 1 capsule by mouth 2 (two) times daily.        Marland Kitchen acetaminophen (TYLENOL ARTHRITIS PAIN) 650 MG CR tablet Take 650 mg by mouth every 8 (eight) hours as needed.        . cyclobenzaprine (FLEXERIL) 10 MG tablet 1/2- 1 tablet by mouth three times a day as needed.       . Potassium 75 MG TABS Take 1 tablet by mouth daily as needed.           Review of Systems Review of Systems  Constitutional: Negative for fever, appetite change, and unexpected weight change.is always fatigued   Eyes: Negative for pain and visual disturbance.  Respiratory: Negative for cough and shortness of breath.   Cardiovascular: Negative for cp or palpitations    Gastrointestinal: Negative for nausea, diarrhea and constipation.  Genitourinary: Negative for urgency and frequency.  Skin: Negative for pallor or  rash   Neurological: Negative for weakness, light-headedness, numbness and headaches.  Hematological: Negative for adenopathy. Does not bruise/bleed easily.  Psychiatric/Behavioral pos for depresssion and anxiety symptoms which are improving .          Objective:   Physical Exam    Constitutional: She appears well-developed and well-nourished.  HENT:  Head: Normocephalic and atraumatic.  Mouth/Throat: Oropharynx is clear and moist.  Eyes: Conjunctivae and EOM are normal. Pupils are equal, round, and reactive to light.  Neck: Normal range of motion. Neck supple. No JVD present. Carotid bruit is not present. No thyromegaly present.  Cardiovascular: Normal rate, regular rhythm, normal heart sounds and intact distal pulses.  Exam reveals no gallop.   Pulmonary/Chest: Effort normal and breath sounds normal. No respiratory distress. She has no wheezes.  Abdominal: Soft. Bowel sounds are normal. She exhibits no distension, no abdominal bruit and no mass. There is no tenderness.  Musculoskeletal: She exhibits no edema and no tenderness.  Lymphadenopathy:    She has no cervical adenopathy.  Neurological: She is alert. She has normal reflexes. She displays no atrophy and no tremor. No cranial nerve deficit or sensory deficit. She exhibits normal muscle tone. She displays no seizure activity. Coordination normal.  Skin: Skin is warm and dry. No rash noted. No erythema. No pallor.  Psychiatric: She has a normal mood and affect.       Spirits are improved today          Assessment & Plan:

## 2011-09-16 NOTE — Patient Instructions (Signed)
Blood pressure is fine today and last few visits also  ? Why it went up at the eye surgeon's office - possibly from anxiety about the procedure Lets see how it is when you go back and watch it carefully  If headache persists after the second cataract surgery - I want to refer you to a neurologist to check it out - so let me know  I 'm glad your stress is a little better  Follow up with me in about 6 weeks to re check you

## 2011-09-18 NOTE — Assessment & Plan Note (Signed)
This continues though some imp with less stress She has had headaches lifelong , but even then -I would like to work up further due to age Pt not ready yet but will call when ready for neurol ref

## 2011-09-18 NOTE — Assessment & Plan Note (Signed)
Thankfully this has improved with some counseling and also imp in stress at home Urged her to hold her ground in terms of home issues and continue counseling Will update me if symptoms worsen

## 2011-09-18 NOTE — Assessment & Plan Note (Signed)
Was very high at eye surgeon's office  but overall much imp now - back to normal  Has been fine here for last several visits - so afraid to adv tx at all  Will continue to monitor - esp in light of chronic headache  Urged her to check at home also

## 2011-09-24 ENCOUNTER — Encounter: Payer: Self-pay | Admitting: Family Medicine

## 2011-09-24 ENCOUNTER — Ambulatory Visit (INDEPENDENT_AMBULATORY_CARE_PROVIDER_SITE_OTHER): Payer: Medicare Other | Admitting: Family Medicine

## 2011-09-24 VITALS — BP 130/76 | HR 84 | Temp 97.8°F | Ht 64.0 in | Wt 242.8 lb

## 2011-09-24 DIAGNOSIS — H9202 Otalgia, left ear: Secondary | ICD-10-CM | POA: Insufficient documentation

## 2011-09-24 DIAGNOSIS — H9209 Otalgia, unspecified ear: Secondary | ICD-10-CM

## 2011-09-24 NOTE — Patient Instructions (Signed)
I think your ear pain was from a temporary fluid build up behind ear drum (from sinus congestion) This can cause pain and also roaring sound in the ear  Continue your flonase If fever or sinus pain develop - please let me know If any rash develops or you have new symptoms let me know

## 2011-09-24 NOTE — Progress Notes (Signed)
Subjective:    Patient ID: Linda [name], female    DOB: 1930/07/20, 77 y.o.   MRN: [id number]  HPI Has L sided ear ache - started on Sunday  Sounds like a roaring sound  No pain today- but it was hurting on the first day -- sharp and then eased off  No ear discharge   Used ear " medicine " from her family member -- was an otc med   No water in ear/ not swimming   Not really dizzy - just a bit off balance No new cold symptoms  Has chronic post nasal drip   No fever   Patient Active Problem List  Diagnoses  . GOITER, MULTINODULAR  . DIABETES MELLITUS, TYPE II  . HYPERCHOLESTEROLEMIA  . OBESITY  . HYPERTENSION  . VENOUS INSUFFICIENCY, LEGS  . ALLERGIC RHINITIS  . RENAL INSUFFICIENCY  . UTI'S, CHRONIC  . SPONDYLOSIS  . DEGENERATIVE DISC DISEASE, CERVICAL SPINE  . ROTATOR CUFF TEAR  . OSTEOPENIA  . EDEMA  . TRANSIENT ISCHEMIC ATTACK, HX OF  . ADVEF, DRUG/MEDICINAL/BIOLOGICAL SUBST NOS  . Headache  . Right hip pain  . Tick bite  . Anxiety  . Stress reaction  . Otalgia of left ear   Past Medical History  Diagnosis Date  . Allergy     allergic rhinitis  . Diabetes mellitus     type II  . Hypertension   . Osteopenia   . Renal insufficiency   . TIA (transient ischemic attack)   . Contact dermatitis     on buttocks  . Pyelonephritis   . Nosebleed    Past Surgical History  Procedure Date  . Rotator cuff repair 06/2007    right  . Abdominal hysterectomy   . Back surgery 03/1999    disk  . Breast surgery     breast biopsy x2 negative  . Shoulder surgery 2007    left  . Joint replacement     Rt knee patella replacement   History  Substance Use Topics  . Smoking status: Never Smoker   . Smokeless tobacco: Not on file  . Alcohol Use: No   Family History  Problem Relation Age of Onset  . Stroke Mother   . Stroke Father    Allergies  Allergen Reactions  . Amoxicillin     REACTION: aching all over  . Atorvastatin     REACTION: increased CPK  .  Cephalexin     REACTION: weak , nauseated and headache  . Cetirizine Hcl     REACTION: makes her head feel big  . Codeine     REACTION: rash  . Cortisone     REACTION: increased bp  . Ezetimibe-Simvastatin   . Fentanyl     REACTION: reaction - stinging in her skin  . Guaifenesin   . Ketorolac Tromethamine     REACTION: no work  . Lisinopril     REACTION: cough - ace related  . Metformin     REACTION: nosebleeds  . Nsaids     REACTION: nosebleeds  . Penicillins     REACTION: nausea and h/a  . Rofecoxib     REACTION: itching  . Rosuvastatin     REACTION: myalgia  . Simvastatin   . Spironolactone     REACTION: nausea   Current Outpatient Prescriptions on File Prior to Visit  Medication Sig Dispense Refill  . acetaminophen (TYLENOL ARTHRITIS PAIN) 650 MG CR tablet Take 650 mg by mouth every 8 (eight)  hours as needed.        Marland Kitchen alendronate (FOSAMAX) 70 MG tablet Take 1 tablet (70 mg total) by mouth every 7 (seven) days. Take with a full glass of water on an empty stomach.  4 tablet  11  . aspirin EC 81 MG tablet Take 81 mg by mouth daily.        . Calcium Carbonate-Vitamin D 600-400 MG-UNIT per tablet Take 1 tablet by mouth daily.        . Cinnamon 500 MG capsule Take 500 mg by mouth 2 (two) times daily.        . fluticasone (FLONASE) 50 MCG/ACT nasal spray Place 2 sprays into the nose daily.  16 g  6  . glucose blood test strip Ck glucose three times daily and as needed.       Marland Kitchen ketotifen (ZADITOR) 0.025 % ophthalmic solution Place 1 drop into both eyes daily.        Marland Kitchen LORazepam (ATIVAN) 0.5 MG tablet One daily as needed for anxiety   30 tablet  0  . losartan (COZAAR) 25 MG tablet Take 25 mg by mouth daily.        Marland Kitchen Lysine 500 MG TABS Take 1 tablet by mouth daily.        . Multiple Vitamin (MULTIVITAMIN) capsule Take 1 capsule by mouth daily.        . Omega-3 Fatty Acids (FISH OIL PO) Take 1 capsule by mouth 2 (two) times daily.        . benzonatate (TESSALON) 200 MG capsule  Take 1 capsule (200 mg total) by mouth 3 (three) times daily as needed for cough.  30 capsule  0  . carboxymethylcellulose (REFRESH PLUS) 0.5 % SOLN Place 1 drop into both eyes daily as needed.        . cyclobenzaprine (FLEXERIL) 10 MG tablet 1/2- 1 tablet by mouth three times a day as needed.       . diclofenac sodium (VOLTAREN) 1 % GEL Apply 1 application topically 4 (four) times daily.        . fluticasone (CUTIVATE) 0.05 % cream Apply topically daily as needed.  30 g  0  . furosemide (LASIX) 40 MG tablet Take 40 mg by mouth daily as needed. Is from Dr Patty Sermons      . Potassium 75 MG TABS Take 1 tablet by mouth daily as needed.             Review of Systems Review of Systems  Constitutional: Negative for fever, appetite change, fatigue and unexpected weight change.  Eyes: Negative for pain and visual disturbance.  ENT pos for chronic post nasal drip/ tinnitus on L only ,  Neg for pain or drainage today Respiratory: Negative for cough and shortness of breath.   Cardiovascular: Negative for cp or palpitations    Gastrointestinal: Negative for nausea, diarrhea and constipation.  Genitourinary: Negative for urgency and frequency.  Skin: Negative for pallor or rash   Neurological: Negative for weakness, light-headedness, numbness and headaches.  Hematological: Negative for adenopathy. Does not bruise/bleed easily.  Psychiatric/Behavioral: Negative for dysphoric mood. The patient is not nervous/anxious.          Objective:   Physical Exam  Constitutional: She appears well-developed and well-nourished. No distress.       overwt and well appearing   HENT:  Head: Normocephalic and atraumatic.  Right Ear: External ear normal.  Left Ear: External ear normal.  Mouth/Throat: Oropharynx is clear and  moist.       Nares boggy / slt congested No sinus tenderness TMs both clear without canal swelling or erythema / no ext changes or tenderness and no active effusion at this time  Throat  -mild post nasal drip that is clear   Eyes: EOM are normal. Pupils are equal, round, and reactive to light.       R conj injection from recent cataract surgery (it does not bother her)   Neck: Normal range of motion. Neck supple. No thyromegaly present.  Cardiovascular: Normal rate, regular rhythm and normal heart sounds.   Pulmonary/Chest: Effort normal and breath sounds normal. She has no wheezes.  Lymphadenopathy:    She has no cervical adenopathy.  Neurological: She is alert.  Skin: Skin is warm and dry. No rash noted. No erythema. No pallor.  Psychiatric: She has a normal mood and affect.          Assessment & Plan:

## 2011-09-24 NOTE — Assessment & Plan Note (Signed)
With normal exam today  Most likely is from ETD and pressure from intermittent sinus cong Pain is already gone  Expect tinnitus to imp over next week inst pt to watch out for inc pain/ fever/ sinus pain or other symptoms -- and call if not further imp in 1 week

## 2011-10-28 ENCOUNTER — Other Ambulatory Visit: Payer: Self-pay

## 2011-10-28 NOTE — Telephone Encounter (Signed)
What for ? Cough - symptoms?

## 2011-10-28 NOTE — Telephone Encounter (Signed)
[facility] faxed refill request for Benzonatate 200 mg. Last filled 07/20/11 and pt last seen 09/24/11.

## 2011-10-31 NOTE — Telephone Encounter (Signed)
Pt said had gotten oTC med for cough and her cough is gone. Pt will call back if has problems.Pt does not want med.

## 2011-12-10 ENCOUNTER — Other Ambulatory Visit: Payer: Self-pay | Admitting: *Deleted

## 2011-12-10 MED ORDER — LORAZEPAM 0.5 MG PO TABS: ORAL_TABLET | ORAL | Status: DC

## 2011-12-10 NOTE — Telephone Encounter (Signed)
Medication phoned to [facility] as instructed.  

## 2011-12-10 NOTE — Telephone Encounter (Signed)
Px written for call in   

## 2011-12-12 ENCOUNTER — Emergency Department (HOSPITAL_COMMUNITY)
Admission: EM | Admit: 2011-12-12 | Discharge: 2011-12-12 | Disposition: A | Discharge status: Home / Self Care | Payer: Medicare Other | Attending: Emergency Medicine | Admitting: Emergency Medicine

## 2011-12-12 ENCOUNTER — Emergency Department (HOSPITAL_COMMUNITY): Payer: Medicare Other

## 2011-12-12 ENCOUNTER — Encounter (HOSPITAL_COMMUNITY): Payer: Self-pay | Admitting: Emergency Medicine

## 2011-12-12 ENCOUNTER — Other Ambulatory Visit: Payer: Self-pay

## 2011-12-12 ENCOUNTER — Telehealth: Payer: Self-pay | Admitting: *Deleted

## 2011-12-12 DIAGNOSIS — Z8673 Personal history of transient ischemic attack (TIA), and cerebral infarction without residual deficits: Secondary | ICD-10-CM | POA: Insufficient documentation

## 2011-12-12 DIAGNOSIS — E119 Type 2 diabetes mellitus without complications: Secondary | ICD-10-CM | POA: Insufficient documentation

## 2011-12-12 DIAGNOSIS — M549 Dorsalgia, unspecified: Secondary | ICD-10-CM | POA: Insufficient documentation

## 2011-12-12 DIAGNOSIS — N390 Urinary tract infection, site not specified: Secondary | ICD-10-CM | POA: Insufficient documentation

## 2011-12-12 DIAGNOSIS — M542 Cervicalgia: Secondary | ICD-10-CM

## 2011-12-12 DIAGNOSIS — R51 Headache: Secondary | ICD-10-CM | POA: Insufficient documentation

## 2011-12-12 DIAGNOSIS — I10 Essential (primary) hypertension: Secondary | ICD-10-CM | POA: Insufficient documentation

## 2011-12-12 DIAGNOSIS — R5381 Other malaise: Secondary | ICD-10-CM | POA: Insufficient documentation

## 2011-12-12 DIAGNOSIS — M25569 Pain in unspecified knee: Secondary | ICD-10-CM | POA: Insufficient documentation

## 2011-12-12 HISTORY — DX: Anxiety disorder, unspecified: F41.9

## 2011-12-12 LAB — URINE MICROSCOPIC-ADD ON

## 2011-12-12 LAB — BASIC METABOLIC PANEL
BUN: 24 mg/dL — ABNORMAL HIGH (ref 6–23)
Chloride: 104 mEq/L (ref 96–112)
GFR calc Af Amer: 45 mL/min — ABNORMAL LOW (ref >90)
GFR calc non Af Amer: 39 mL/min — ABNORMAL LOW (ref >90)
Glucose, Bld: 91 mg/dL (ref 70–99)
Potassium: 4 mEq/L (ref 3.5–5.1)
Sodium: 143 mEq/L (ref 135–145)

## 2011-12-12 LAB — DIFFERENTIAL
Basophils Relative: 0 % (ref 0–1)
Eosinophils Absolute: 0.2 10*3/uL (ref 0.0–0.7)
Lymphs Abs: 2.1 10*3/uL (ref 0.7–4.0)
Monocytes Relative: 6 % (ref 3–12)
Neutro Abs: 6.3 10*3/uL (ref 1.7–7.7)
Neutrophils Relative %: 69 % (ref 43–77)

## 2011-12-12 LAB — CBC
Hemoglobin: 11.7 g/dL — ABNORMAL LOW (ref 12.0–15.0)
MCH: 29.5 pg (ref 26.0–34.0)
Platelets: 261 10*3/uL (ref 150–400)
RBC: 3.96 MIL/uL (ref 3.87–5.11)
WBC: 9.3 10*3/uL (ref 4.0–10.5)

## 2011-12-12 LAB — URINALYSIS, ROUTINE W REFLEX MICROSCOPIC
Nitrite: NEGATIVE
Specific Gravity, Urine: 1.01 (ref 1.005–1.030)
Urobilinogen, UA: 1 mg/dL (ref 0.0–1.0)
pH: 7 (ref 5.0–8.0)

## 2011-12-12 MED ORDER — SULFAMETHOXAZOLE-TRIMETHOPRIM 800-160 MG PO TABS: 1.0000 | ORAL_TABLET | Freq: Two times a day (BID) | ORAL | Status: AC

## 2011-12-12 NOTE — ED Provider Notes (Signed)
History     CSN: [id number]  Arrival date & time 12/12/11  1316   None     Chief Complaint  Patient presents with  . Headache  . Neck Pain    (Consider location/radiation/quality/duration/timing/severity/associated sxs/prior treatment) The history is provided by the patient.  Pt is a 78yo female. States she is here with several complaints. States two days ago, she got up in the middle of the night and states her legs "gave out" at that time and she was unable to get up or walk for the next day. States "my legs lost feelings in them." Pt states she went to see Dr. Ethelene Hal who took some x-rays. States she was treated with percocet. Pt states she was doing well until today when she woke up from her nap with a popping sensation behind right ear and a headache. Pt state headache improved since then, and there is just mild tenderness behind the ear. Pt states she feels weak overall, has pain in her back, in both knees. Her daughter who is in the room stated "we need to figure out why she keeps falling." She has had two falls each time loosing sensation   Past Medical History  Diagnosis Date  . Allergy     allergic rhinitis  . Diabetes mellitus     type II  . Hypertension   . Osteopenia   . Renal insufficiency   . TIA (transient ischemic attack)   . Contact dermatitis     on buttocks  . Pyelonephritis   . Nosebleed   . Anxiety     Past Surgical History  Procedure Date  . Rotator cuff repair 06/2007    right  . Abdominal hysterectomy   . Back surgery 03/1999    disk  . Breast surgery     breast biopsy x2 negative  . Shoulder surgery 2007    left  . Joint replacement     Rt knee patella replacement    Family History  Problem Relation Age of Onset  . Stroke Mother   . Stroke Father     History  Substance Use Topics  . Smoking status: Never Smoker   . Smokeless tobacco: Not on file  . Alcohol Use: No    OB History    Grav Para Term Preterm Abortions TAB SAB Ect Mult  Living                  Review of Systems  Allergies  Amoxicillin; Atorvastatin; Cephalexin; Cetirizine hcl; Codeine; Cortisone; Ezetimibe-simvastatin; Fentanyl; Guaifenesin; Ketorolac tromethamine; Lisinopril; Metformin; Nsaids; Penicillins; Rofecoxib; Rosuvastatin; Simvastatin; and Spironolactone  Home Medications   Current Outpatient Rx  Name Route Sig Dispense Refill  . ACETAMINOPHEN ER 650 MG PO TBCR Oral Take 650 mg by mouth every 8 (eight) hours as needed. for pain    . ALENDRONATE SODIUM 70 MG PO TABS Oral Take 70 mg by mouth every 7 (seven) days. Take with a full glass of water on an empty stomach. Takes on thursdays    . ASPIRIN EC 81 MG PO TBEC Oral Take 81 mg by mouth daily.      Marland Kitchen CALCIUM CARBONATE-VITAMIN D 600-400 MG-UNIT PO TABS Oral Take 1 tablet by mouth daily.      Marland Kitchen CARBOXYMETHYLCELLULOSE SODIUM 0.5 % OP SOLN Both Eyes Place 1 drop into both eyes daily as needed.      Marland Kitchen CINNAMON 500 MG PO CAPS Oral Take 1,000 mg by mouth daily.     Marland Kitchen  CYCLOBENZAPRINE HCL 10 MG PO TABS Oral Take 5-10 mg by mouth 3 (three) times daily as needed. Muscle spams    . DICLOFENAC SODIUM 1 % TD GEL Topical Apply 1 application topically 4 (four) times daily.      Marland Kitchen FLUTICASONE PROPIONATE 0.05 % EX CREA Topical Apply topically daily as needed. 30 g 0  . FUROSEMIDE 40 MG PO TABS Oral Take 40 mg by mouth daily as needed. Is from Dr Patty Sermons    . KETOTIFEN FUMARATE 0.025 % OP SOLN Both Eyes Place 1 drop into both eyes daily.      Marland Kitchen LORAZEPAM 0.5 MG PO TABS  One daily as needed for anxiety 30 tablet 0  . LOSARTAN POTASSIUM 25 MG PO TABS Oral Take 25 mg by mouth daily.      Marland Kitchen LYSINE 500 MG PO TABS Oral Take 1 tablet by mouth daily.      Marland Kitchen FISH OIL PO Oral Take 1 capsule by mouth 2 (two) times daily.      Marland Kitchen POTASSIUM 75 MG PO TABS Oral Take 1 tablet by mouth daily as needed.      Marland Kitchen GLUCOSE BLOOD VI STRP  Ck glucose three times daily and as needed.       BP 148/80  Pulse 96  Temp(Src) 98 F (36.7  C) (Oral)  Resp 18  Ht 5\' 6"  (1.676 m)  Wt 230 lb (104.327 kg)  BMI 37.12 kg/m2  SpO2 96%  Physical Exam  Nursing note and vitals reviewed. Constitutional: She is oriented to person, place, and time. She appears well-developed and well-nourished.  HENT:  Head: Normocephalic and atraumatic.  Right Ear: External ear normal.  Left Ear: External ear normal.  Nose: Nose normal.  Mouth/Throat: Oropharynx is clear and moist.       Mild tenderness behind right ear. No redness, no swelling right TM appears normal.  Eyes: Conjunctivae and EOM are normal. Pupils are equal, round, and reactive to light.  Neck: Normal range of motion. Neck supple.  Cardiovascular: Normal rate, regular rhythm and normal heart sounds.   Pulmonary/Chest: Effort normal and breath sounds normal. No respiratory distress. She has no wheezes. She has no rales.  Abdominal: Soft. Bowel sounds are normal. She exhibits no distension. There is no tenderness.  Musculoskeletal: Normal range of motion.       Full ROM of bilateral hips, no pain with ROM. Pain with bilateral knee rom.   Lymphadenopathy:    She has no cervical adenopathy.  Neurological: She is alert and oriented to person, place, and time. She has normal reflexes. No cranial nerve deficit. Coordination normal.       5/5 and equal upper and lower extremity strength bilaterally, gait normal with a caine. Normal coordination finger to nose, no pronator drift  Skin: Skin is warm and dry.  Psychiatric: She has a normal mood and affect.    ED Course  Procedures (including critical care time)  Pt with multiple complaints including headache, ear ache, weakness, back pain, bilateral knee pain, falls with leg numbness. Pt currently in NAD. neurovascularly currently intact. Discussed with Dr. Ignacia Palma, CTs, x-rays, labs ordered and pending. Will sign out to the next team.   3:26 PM Signed out pt to Dr. Patrica Duel at shift change. Disposition based on results.   Medical  screening examination/treatment/procedure(s) were conducted as a shared visit with non-physician practitioner(s) and myself.  I personally evaluated the patient during the encounter Pt has had episodes where her legs would "  give out," with numbness afterwards.  Today she took a nap and woke up with a painful popping sensation in the right side of her head behind the right ear, in the area of the mastoid process.  Physical exam shows her to be an obese elderly woman in no distress.  She is neurologically intact.  Workup including CT of head and neck recommended. Carleene Cooper III, M.D.   1. Neck pain   2. UTI (lower urinary tract infection)              Lottie Mussel, PA 12/12/11 1527  Carleene Cooper III, MD 12/14/11 1155

## 2011-12-12 NOTE — ED Notes (Signed)
Per [facility], pt awoke to popping sound behind right ear.  Now has headache/ neck pain.  Had fall 1 week ago with loss of conscious and was evaluated/ x-rayed by primary MD.  BP 170/100, Pulse 84, RR 16, CBG 125.

## 2011-12-12 NOTE — ED Notes (Signed)
MD at bedside. 

## 2011-12-12 NOTE — ED Provider Notes (Signed)
4:51 PM    Received care from day time doc/ PA   Pending labs and CT   Results for orders placed during the hospital encounter of 12/12/11  CBC      Component Value Range   WBC 9.3  4.0 - 10.5 (K/uL)   RBC 3.96  3.87 - 5.11 (MIL/uL)   Hemoglobin 11.7 (*) 12.0 - 15.0 (g/dL)   HCT 78.2 (*) 95.6 - 46.0 (%)   MCV 86.9  78.0 - 100.0 (fL)   MCH 29.5  26.0 - 34.0 (pg)   MCHC 34.0  30.0 - 36.0 (g/dL)   RDW 21.3  08.6 - 57.8 (%)   Platelets 261  150 - 400 (K/uL)  DIFFERENTIAL      Component Value Range   Neutrophils Relative 69  43 - 77 (%)   Neutro Abs 6.3  1.7 - 7.7 (K/uL)   Lymphocytes Relative 23  12 - 46 (%)   Lymphs Abs 2.1  0.7 - 4.0 (K/uL)   Monocytes Relative 6  3 - 12 (%)   Monocytes Absolute 0.5  0.1 - 1.0 (K/uL)   Eosinophils Relative 3  0 - 5 (%)   Eosinophils Absolute 0.2  0.0 - 0.7 (K/uL)   Basophils Relative 0  0 - 1 (%)   Basophils Absolute 0.0  0.0 - 0.1 (K/uL)  BASIC METABOLIC PANEL      Component Value Range   Sodium 143  135 - 145 (mEq/L)   Potassium 4.0  3.5 - 5.1 (mEq/L)   Chloride 104  96 - 112 (mEq/L)   CO2 28  19 - 32 (mEq/L)   Glucose, Bld 91  70 - 99 (mg/dL)   BUN 24 (*) 6 - 23 (mg/dL)   Creatinine, Ser 4.69 (*) 0.50 - 1.10 (mg/dL)   Calcium 62.9  8.4 - 10.5 (mg/dL)   GFR calc non Af Amer 39 (*) >90 (mL/min)   GFR calc Af Amer 45 (*) >90 (mL/min)  URINALYSIS, ROUTINE W REFLEX MICROSCOPIC      Component Value Range   Color, Urine YELLOW  YELLOW    APPearance CLEAR  CLEAR    Specific Gravity, Urine 1.010  1.005 - 1.030    pH 7.0  5.0 - 8.0    Glucose, UA NEGATIVE  NEGATIVE (mg/dL)   Hgb urine dipstick NEGATIVE  NEGATIVE    Bilirubin Urine NEGATIVE  NEGATIVE    Ketones, ur NEGATIVE  NEGATIVE (mg/dL)   Protein, ur NEGATIVE  NEGATIVE (mg/dL)   Urobilinogen, UA 1.0  0.0 - 1.0 (mg/dL)   Nitrite NEGATIVE  NEGATIVE    Leukocytes, UA MODERATE (*) NEGATIVE   URINE MICROSCOPIC-ADD ON      Component Value Range   WBC, UA 3-6  <3 (WBC/hpf)   Bacteria, UA RARE  RARE    Dg Chest 2 View  12/12/2011  *RADIOLOGY REPORT*  Clinical Data: Headache and neck pain.  CHEST - 2 VIEW  Comparison: 10/09/2010 the  Findings: Patient has undergone lower cervical spine fusion and right shoulder replacement.  There are a few prominent lung markings which have not significantly changed.  There is no focal airspace disease and no evidence for edema.  Heart size is within normal limits.  Multilevel degenerative change along the right side of the thoracic spine.  IMPRESSION: No acute chest findings.  Original Report Authenticated By: Richarda Overlie, M.D.   Dg Lumbar Spine Complete  12/12/2011  *RADIOLOGY REPORT*  Clinical Data: Lower back pain.  LUMBAR SPINE -  COMPLETE 4+ VIEW  Comparison: 11/13/2007  Findings: AP, lateral and oblique images of the lumbar spine were obtained.  There are severe facet changes at L4-L5 and L5-1.  There is stable grade 1 anterolisthesis at L4-L5.  Severe degenerative disc disease and endplate disease at L5-S1.  There has been progression of the anterior endplate disease at L2-L3 and L3-L4 and increased anterior degenerative changes at T10-T11.  IMPRESSION: Progression of the degenerative changes in the lower thoracic spine and lumbar spine.  Minimal change in the anterolisthesis at L4-L5.  No evidence for a compression fracture.  Original Report Authenticated By: Richarda Overlie, M.D.   Ct Head Wo Contrast  12/12/2011  *RADIOLOGY REPORT*  Clinical Data:  Fall 1 week ago.  Loss of consciousness.  CT HEAD WITHOUT CONTRAST CT CERVICAL SPINE WITHOUT CONTRAST  Technique:  Multidetector CT imaging of the head and cervical spine was performed following the standard protocol without intravenous contrast.  Multiplanar CT image reconstructions of the cervical spine were also generated.  Comparison:  07/11/2011.  CT HEAD  Findings: No mass lesion, mass effect, midline shift, hydrocephalus, hemorrhage.  No acute territorial cortical ischemia/infarct. Atrophy and  chronic ischemic white matter disease is present.  Dural calcification is present along the inner table of the skull, unchanged from prior examination (image 19 series 2) along the posterior left frontal convexity.  Bilateral lens extractions are present.  Visualized paranasal sinuses appear within normal limits.  No skull fracture.  IMPRESSION: Atrophy and chronic ischemic white matter disease without acute intracranial abnormality.  CT CERVICAL SPINE  Findings: Carotid atherosclerosis is present.  Cervical spinal alignment anatomic.  There is a C5-C6 ACDF with completed fusion. Severe adjacent segment disease is present at C6-C7, with severe disc degeneration and shallow broad-based disc osteophyte complex. Left greater than right uncovertebral spurring and foraminal encroachment at C6-C7.  Cervicothoracic junction appears normal. Sternoclavicular degenerative disease is present.  The visualized lung apices show atelectasis.  Rightward tracheal deviation at the thoracic inlet associated with tortuous aortic arch.  Nonspecific 15 mm left upper pole thyroid nodule is present.  Follow-up thyroid ultrasound recommended.  Craniocervical junction appears normal.  IMPRESSION: 1. No cervical spine fracture or dislocation.  C5-C6 ACDF with severe adjacent segment C6-C7 degenerative disease. 2.  15 mm exophytic right of the thyroid lobe nodule is nonspecific by CT.  Follow-up nonemergent thyroid ultrasound recommended. 3.  Atherosclerosis.  Original Report Authenticated By: Andreas Newport, M.D.   Ct Cervical Spine Wo Contrast  12/12/2011  *RADIOLOGY REPORT*  Clinical Data:  Fall 1 week ago.  Loss of consciousness.  CT HEAD WITHOUT CONTRAST CT CERVICAL SPINE WITHOUT CONTRAST  Technique:  Multidetector CT imaging of the head and cervical spine was performed following the standard protocol without intravenous contrast.  Multiplanar CT image reconstructions of the cervical spine were also generated.  Comparison:  07/11/2011.   CT HEAD  Findings: No mass lesion, mass effect, midline shift, hydrocephalus, hemorrhage.  No acute territorial cortical ischemia/infarct. Atrophy and chronic ischemic white matter disease is present.  Dural calcification is present along the inner table of the skull, unchanged from prior examination (image 19 series 2) along the posterior left frontal convexity.  Bilateral lens extractions are present.  Visualized paranasal sinuses appear within normal limits.  No skull fracture.  IMPRESSION: Atrophy and chronic ischemic white matter disease without acute intracranial abnormality.  CT CERVICAL SPINE  Findings: Carotid atherosclerosis is present.  Cervical spinal alignment anatomic.  There is a C5-C6 ACDF with completed  fusion. Severe adjacent segment disease is present at C6-C7, with severe disc degeneration and shallow broad-based disc osteophyte complex. Left greater than right uncovertebral spurring and foraminal encroachment at C6-C7.  Cervicothoracic junction appears normal. Sternoclavicular degenerative disease is present.  The visualized lung apices show atelectasis.  Rightward tracheal deviation at the thoracic inlet associated with tortuous aortic arch.  Nonspecific 15 mm left upper pole thyroid nodule is present.  Follow-up thyroid ultrasound recommended.  Craniocervical junction appears normal.  IMPRESSION: 1. No cervical spine fracture or dislocation.  C5-C6 ACDF with severe adjacent segment C6-C7 degenerative disease. 2.  15 mm exophytic right of the thyroid lobe nodule is nonspecific by CT.  Follow-up nonemergent thyroid ultrasound recommended. 3.  Atherosclerosis.  Original Report Authenticated By: Andreas Newport, M.D.    CT scan findings, documented above. CT head showed atrophy and chronic ischemic white matter disease without any acute abnormality. CT cervical spine showed no fracture or dislocation but severe degenerative disease. Also, a thyroid nodule with recommendations for nonemergent  ultrasound evaluation. X-rays of the lumbar spine showed severe arthritic changes.   Urinalysis showed probable UTI. Lites were normal. Mild elevation of BUN and creatinine. CBC was normal.  This was discussed with family backup with taking her home and she can followup with her orthopedist, Dr. Sarajane Jews A. Patrica Duel, MD 12/12/11 1610

## 2011-12-12 NOTE — Telephone Encounter (Signed)
Patient called stating that she fell in her home on Friday, she stated that she "lost all control of my legs."  She was seen by Dr. Ethelene Hal on Friday and stated that xrays were done and they didn't find any fractures.  She is complaining of being dizzy, neck pain, and a severe sharp pain behind her ear.  I could tell on the phone that patient sounded like she was in pain because her voice begin to change.  Patient stated "I have got to do something because I can't take this pain."  I advised patient that it sounded like she needs to be evaluated right away and she needs to go to either an Urgent Care of the ED.  Patients daughter was there with her and agreed to take patient to the ED.

## 2011-12-12 NOTE — ED Notes (Addendum)
Pt to xray on stretcher

## 2011-12-12 NOTE — Telephone Encounter (Signed)
I agree- please go to ER- symptoms are concerning

## 2011-12-23 ENCOUNTER — Other Ambulatory Visit: Payer: Self-pay | Admitting: *Deleted

## 2011-12-23 NOTE — Telephone Encounter (Signed)
Please find out what she needs this for -- symptoms/ cough? Fever? Prod? Thanks

## 2011-12-23 NOTE — Telephone Encounter (Signed)
This medication is not on patient medication list. Ok to refill?

## 2011-12-25 ENCOUNTER — Encounter: Payer: Self-pay | Admitting: Family Medicine

## 2011-12-26 NOTE — Telephone Encounter (Signed)
Patient notified as instructed by telephone. Pt said she did not need Tessalon refilled. Pt said she is not having any symptoms.

## 2012-02-12 ENCOUNTER — Other Ambulatory Visit: Payer: Self-pay | Admitting: Family Medicine

## 2012-02-20 ENCOUNTER — Other Ambulatory Visit: Payer: Self-pay | Admitting: Family Medicine

## 2012-02-24 ENCOUNTER — Other Ambulatory Visit: Payer: Medicare Other

## 2012-06-22 ENCOUNTER — Ambulatory Visit (INDEPENDENT_AMBULATORY_CARE_PROVIDER_SITE_OTHER): Payer: Medicare Other | Admitting: Cardiology

## 2012-06-22 ENCOUNTER — Encounter: Payer: Self-pay | Admitting: Cardiology

## 2012-06-22 ENCOUNTER — Telehealth: Payer: Self-pay | Admitting: Cardiology

## 2012-06-22 VITALS — BP 146/82 | HR 78 | Ht 65.0 in | Wt 239.8 lb

## 2012-06-22 DIAGNOSIS — E119 Type 2 diabetes mellitus without complications: Secondary | ICD-10-CM

## 2012-06-22 DIAGNOSIS — R51 Headache: Secondary | ICD-10-CM

## 2012-06-22 DIAGNOSIS — I119 Hypertensive heart disease without heart failure: Secondary | ICD-10-CM

## 2012-06-22 MED ORDER — FUROSEMIDE 20 MG PO TABS: 20.0000 mg | ORAL_TABLET | Freq: Every day | ORAL | Status: DC

## 2012-06-22 NOTE — Assessment & Plan Note (Signed)
The patient has type 2 diabetes.  She is not on any diabetic medication at this time.  Her primary care provider is now Dr. Aida Puffer.  She has not been on a careful diet and her weight is up 11 pounds

## 2012-06-22 NOTE — Assessment & Plan Note (Signed)
Her blood pressure is elevated today.  He has been having some headaches which may be related to this.  He has not been careful with her diet.  She has Lasix 40 mg tablets on hand but never uses them.  We will switch to Lasix 20 mg one daily on a regular basis.  She does have moderate pedal edema

## 2012-06-22 NOTE — Progress Notes (Signed)
Linda [name] Date of Birth:  August 18, 1930 [facility] [address] [place], [place]  [address] [phone]         Fax   [phone]  History of Present Illness: This pleasant 78 year old woman is seen after a long absence.  We last saw her in March 2011.  She has a history of palpitations and a history of aortic valve disease.  He had an echocardiogram in April 2009 showing normal systolic function and normal pulmonary artery pressure.  She has mild LVH with impaired relaxation and has trivial aortic insufficiency and trace mitral regurgitation on her previous echo.  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  She has had occasional chest tightness not related to exertion.  She's had occasional headaches.  Since last visit she has gained 11 pounds and has not been on a careful diet.  Current Outpatient Prescriptions  Medication Sig Dispense Refill  . acetaminophen (TYLENOL ARTHRITIS PAIN) 650 MG CR tablet Take 650 mg by mouth every 8 (eight) hours as needed. for pain      . alendronate (FOSAMAX) 70 MG tablet Take 70 mg by mouth every 7 (seven) days. Take with a full glass of water on an empty stomach. Takes on thursdays      . aspirin EC 81 MG tablet Take 81 mg by mouth daily.        . Calcium Carbonate-Vitamin D 600-400 MG-UNIT per tablet Take 1 tablet by mouth daily.        . carboxymethylcellulose (REFRESH PLUS) 0.5 % SOLN Place 1 drop into both eyes daily as needed.        . Cinnamon 500 MG capsule Take 1,000 mg by mouth daily.       . cyclobenzaprine (FLEXERIL) 10 MG tablet Take 5-10 mg by mouth 3 (three) times daily as needed. Muscle spams      . diclofenac sodium (VOLTAREN) 1 % GEL Apply 1 application topically 4 (four) times daily.        . fluticasone (CUTIVATE) 0.05 % cream Apply topically daily as needed.  30 g  0  . furosemide (LASIX) 20 MG tablet Take 1 tablet (20 mg total) by mouth daily.  30 tablet  5  . glucose blood test strip Ck glucose three times  daily and as needed.       Marland Kitchen ketotifen (ZADITOR) 0.025 % ophthalmic solution Place 1 drop into both eyes daily.        Marland Kitchen LORazepam (ATIVAN) 0.5 MG tablet One daily as needed for anxiety  30 tablet  0  . losartan (COZAAR) 25 MG tablet TAKE ONE (1) TABLET BY MOUTH EVERY      DAY  30 tablet  6  . Lysine 500 MG TABS Take 1 tablet by mouth daily.        . Omega-3 Fatty Acids (FISH OIL PO) Take 1 capsule by mouth 2 (two) times daily.        . Potassium 75 MG TABS Take 1 tablet by mouth daily as needed.        Marland Kitchen DISCONTD: furosemide (LASIX) 40 MG tablet Take 40 mg by mouth daily as needed. Is from Dr Patty Sermons        Allergies  Allergen Reactions  . Amoxicillin     REACTION: aching all over  . Atorvastatin     REACTION: increased CPK  . Cephalexin     REACTION: weak , nauseated and headache  . Cetirizine Hcl  REACTION: makes her head feel big  . Codeine     REACTION: rash  . Cortisone     REACTION: increased bp  . Ezetimibe-Simvastatin   . Fentanyl     REACTION: reaction - stinging in her skin  . Guaifenesin   . Ketorolac Tromethamine     REACTION: no work  . Lisinopril     REACTION: cough - ace related  . Metformin     REACTION: nosebleeds  . Nsaids     REACTION: nosebleeds  . Penicillins     REACTION: nausea and h/a  . Rofecoxib     REACTION: itching  . Rosuvastatin     REACTION: myalgia  . Simvastatin Other (See Comments)    unknown  . Spironolactone     REACTION: nausea    Patient Active Problem List  Diagnosis  . GOITER, MULTINODULAR  . DIABETES MELLITUS, TYPE II  . HYPERCHOLESTEROLEMIA  . OBESITY  . HYPERTENSION  . VENOUS INSUFFICIENCY, LEGS  . ALLERGIC RHINITIS  . RENAL INSUFFICIENCY  . UTI'S, CHRONIC  . SPONDYLOSIS  . DEGENERATIVE DISC DISEASE, CERVICAL SPINE  . ROTATOR CUFF TEAR  . OSTEOPENIA  . EDEMA  . TRANSIENT ISCHEMIC ATTACK, HX OF  . ADVEF, DRUG/MEDICINAL/BIOLOGICAL SUBST NOS  . Headache  . Right hip pain  . Tick bite  . Anxiety  .  Stress reaction  . Otalgia of left ear  . Benign hypertensive heart disease without heart failure    History  Smoking status  . Never Smoker   Smokeless tobacco  . Not on file    History  Alcohol Use No    Family History  Problem Relation Age of Onset  . Stroke Mother   . Stroke Father     Review of Systems: Constitutional: no fever chills diaphoresis or fatigue or change in weight.  Head and neck: no hearing loss, no epistaxis, no photophobia or visual disturbance. Respiratory: No cough, shortness of breath or wheezing. Cardiovascular: No chest pain peripheral edema, palpitations. Gastrointestinal: No abdominal distention, no abdominal pain, no change in bowel habits hematochezia or melena. Genitourinary: No dysuria, no frequency, no urgency, no nocturia. Musculoskeletal:No arthralgias, no back pain, no gait disturbance or myalgias. Neurological: No dizziness, no headaches, no numbness, no seizures, no syncope, no weakness, no tremors. Hematologic: No lymphadenopathy, no easy bruising. Psychiatric: No confusion, no hallucinations, no sleep disturbance.    Physical Exam: Filed Vitals:   06/22/12 1401  BP: 146/82  Pulse: 78   the general appearance reveals a large woman in no acute distress.Pupils equal and reactive.   Extraocular Movements are full.  There is no scleral icterus.  The mouth and pharynx are normal.  The neck is supple.  The carotids reveal no bruits.  The jugular venous pressure is normal.  The thyroid is not enlarged.  There is no lymphadenopathy.  The chest is clear to percussion and auscultation. There are no rales or rhonchi. Expansion of the chest is symmetrical.  Heart reveals a soft systolic ejection murmur at the base.  Rhythm is regular. Normal abdomen.  No hepatosplenomegaly or masses are palpable. The pedal pulses are good.  There is no phlebitis.  There is moderate pedal edema.  There is no cyanosis or clubbing. Strength is normal and  symmetrical in all extremities.  There is no lateralizing weakness.  There are no sensory deficits.  The skin is warm and dry.  There is no rash.  EKG shows normal sinus rhythm with occasional PACs.  No ischemic changes.   Assessment / Plan:  The patient is to continue on same medication except start Lasix 20 mg one daily.  Needs to be on a low-salt diet.  He is to cut back on sugar and calories and lose weight.  Recheck in 6 months for followup office visit and basal metabolic panel

## 2012-06-22 NOTE — Telephone Encounter (Signed)
Discussed Lasix and starting it with patient

## 2012-06-22 NOTE — Patient Instructions (Signed)
Change your Lasix to 20 mg daily  Work on weight loss to help lower your blood pressure   Your physician wants you to follow-up in: 6 months You will receive a reminder letter in the mail two months in advance. If you don't receive a letter, please call our office to schedule the follow-up appointment.

## 2012-06-22 NOTE — Telephone Encounter (Signed)
New msg Pt wants to talk to you about appt today

## 2012-06-25 ENCOUNTER — Telehealth: Payer: Self-pay | Admitting: Cardiology

## 2012-06-25 DIAGNOSIS — I119 Hypertensive heart disease without heart failure: Secondary | ICD-10-CM

## 2012-06-25 NOTE — Telephone Encounter (Signed)
Advised patient

## 2012-06-25 NOTE — Telephone Encounter (Signed)
Go back on the losartan. BMET in 2 weeks. May use the Lasix prn. Monitor BP and swelling.

## 2012-06-25 NOTE — Telephone Encounter (Signed)
Will forward to  Dr. Brackbill for review 

## 2012-06-25 NOTE — Telephone Encounter (Signed)
Patient started lasix yesterday afternoon after office visit.  Last night after eating dinner she had vomiting and she felt better. Had taken her medicine several hours before she threw up.  She did not have any itching or rash.  Patient doesn't remember taking ever taking.  Did go over medications with daughter and she is not taking Cozaar.  Will forward to [facility] NP for review   [phone] daughter Dois Davenport

## 2012-06-25 NOTE — Telephone Encounter (Signed)
New msg Pt said she started taking furosemide yesterday and she said she got sick. Please call

## 2012-06-25 NOTE — Telephone Encounter (Signed)
Follow-up:    Patient's daughter called in to say that the patient is already taking her losartan (COZAAR) 25 MG tablet and would like to know how to proceed.  Please call back.

## 2012-06-25 NOTE — Telephone Encounter (Signed)
Dr. Patty Sermons out of office will forward to Dawayne Patricia NP for review

## 2012-06-25 NOTE — Telephone Encounter (Signed)
Cut back the salt. Same dose of Losartan. Monitor BP and call with readings in 2 weeks. Lasix just PRN.

## 2012-07-16 ENCOUNTER — Other Ambulatory Visit: Payer: Self-pay | Admitting: *Deleted

## 2012-07-16 MED ORDER — LORAZEPAM 0.5 MG PO TABS: ORAL_TABLET | ORAL | Status: DC

## 2012-07-16 NOTE — Telephone Encounter (Signed)
Px written for call in   

## 2012-07-16 NOTE — Telephone Encounter (Signed)
Rx Ativan called in to [facility]

## 2012-08-09 ENCOUNTER — Encounter: Payer: Self-pay | Admitting: Cardiology

## 2012-08-16 ENCOUNTER — Other Ambulatory Visit: Payer: Self-pay | Admitting: Family Medicine

## 2012-08-16 NOTE — Telephone Encounter (Signed)
Ok to refill 

## 2012-08-16 NOTE — Telephone Encounter (Signed)
Px written for call in   

## 2012-08-16 NOTE — Telephone Encounter (Signed)
Both Rx called in as prescribed  

## 2012-08-26 ENCOUNTER — Encounter (HOSPITAL_COMMUNITY): Payer: Self-pay | Admitting: Emergency Medicine

## 2012-08-26 DIAGNOSIS — E119 Type 2 diabetes mellitus without complications: Secondary | ICD-10-CM | POA: Insufficient documentation

## 2012-08-26 DIAGNOSIS — K5731 Diverticulosis of large intestine without perforation or abscess with bleeding: Principal | ICD-10-CM | POA: Insufficient documentation

## 2012-08-26 DIAGNOSIS — D500 Iron deficiency anemia secondary to blood loss (chronic): Secondary | ICD-10-CM | POA: Insufficient documentation

## 2012-08-26 DIAGNOSIS — Z7982 Long term (current) use of aspirin: Secondary | ICD-10-CM | POA: Insufficient documentation

## 2012-08-26 DIAGNOSIS — Z79899 Other long term (current) drug therapy: Secondary | ICD-10-CM | POA: Insufficient documentation

## 2012-08-26 DIAGNOSIS — N289 Disorder of kidney and ureter, unspecified: Secondary | ICD-10-CM | POA: Insufficient documentation

## 2012-08-26 DIAGNOSIS — Z8673 Personal history of transient ischemic attack (TIA), and cerebral infarction without residual deficits: Secondary | ICD-10-CM | POA: Insufficient documentation

## 2012-08-26 DIAGNOSIS — M949 Disorder of cartilage, unspecified: Secondary | ICD-10-CM | POA: Insufficient documentation

## 2012-08-26 DIAGNOSIS — I10 Essential (primary) hypertension: Secondary | ICD-10-CM | POA: Insufficient documentation

## 2012-08-26 DIAGNOSIS — K644 Residual hemorrhoidal skin tags: Secondary | ICD-10-CM | POA: Insufficient documentation

## 2012-08-26 DIAGNOSIS — F411 Generalized anxiety disorder: Secondary | ICD-10-CM | POA: Insufficient documentation

## 2012-08-26 DIAGNOSIS — K648 Other hemorrhoids: Secondary | ICD-10-CM | POA: Insufficient documentation

## 2012-08-26 DIAGNOSIS — D126 Benign neoplasm of colon, unspecified: Secondary | ICD-10-CM | POA: Insufficient documentation

## 2012-08-26 DIAGNOSIS — D175 Benign lipomatous neoplasm of intra-abdominal organs: Secondary | ICD-10-CM | POA: Insufficient documentation

## 2012-08-26 DIAGNOSIS — M899 Disorder of bone, unspecified: Secondary | ICD-10-CM | POA: Insufficient documentation

## 2012-08-26 NOTE — ED Notes (Signed)
Pt st's she is currently being tx for UTI.  St's this pm she had a BM with bright red blood.  Pt denies any abd pain or other complaints

## 2012-08-26 NOTE — ED Notes (Signed)
Unsuccessful blood draw attempt lab informed.

## 2012-08-27 ENCOUNTER — Observation Stay (HOSPITAL_COMMUNITY)
Admission: EM | Admit: 2012-08-27 | Discharge: 2012-08-28 | Disposition: A | Discharge status: Home / Self Care | Payer: Medicare Other | Attending: Internal Medicine | Admitting: Internal Medicine

## 2012-08-27 ENCOUNTER — Encounter (HOSPITAL_COMMUNITY): Payer: Self-pay | Admitting: Internal Medicine

## 2012-08-27 DIAGNOSIS — K921 Melena: Secondary | ICD-10-CM | POA: Diagnosis present

## 2012-08-27 DIAGNOSIS — H9202 Otalgia, left ear: Secondary | ICD-10-CM

## 2012-08-27 DIAGNOSIS — E669 Obesity, unspecified: Secondary | ICD-10-CM

## 2012-08-27 DIAGNOSIS — I872 Venous insufficiency (chronic) (peripheral): Secondary | ICD-10-CM

## 2012-08-27 DIAGNOSIS — E042 Nontoxic multinodular goiter: Secondary | ICD-10-CM

## 2012-08-27 DIAGNOSIS — E7800 Pure hypercholesterolemia, unspecified: Secondary | ICD-10-CM

## 2012-08-27 DIAGNOSIS — J309 Allergic rhinitis, unspecified: Secondary | ICD-10-CM

## 2012-08-27 DIAGNOSIS — N390 Urinary tract infection, site not specified: Secondary | ICD-10-CM

## 2012-08-27 DIAGNOSIS — I119 Hypertensive heart disease without heart failure: Secondary | ICD-10-CM

## 2012-08-27 DIAGNOSIS — D649 Anemia, unspecified: Secondary | ICD-10-CM

## 2012-08-27 DIAGNOSIS — M949 Disorder of cartilage, unspecified: Secondary | ICD-10-CM

## 2012-08-27 DIAGNOSIS — R6889 Other general symptoms and signs: Secondary | ICD-10-CM

## 2012-08-27 DIAGNOSIS — K922 Gastrointestinal hemorrhage, unspecified: Secondary | ICD-10-CM

## 2012-08-27 DIAGNOSIS — M899 Disorder of bone, unspecified: Secondary | ICD-10-CM

## 2012-08-27 DIAGNOSIS — W57XXXA Bitten or stung by nonvenomous insect and other nonvenomous arthropods, initial encounter: Secondary | ICD-10-CM

## 2012-08-27 DIAGNOSIS — R609 Edema, unspecified: Secondary | ICD-10-CM

## 2012-08-27 DIAGNOSIS — I10 Essential (primary) hypertension: Secondary | ICD-10-CM

## 2012-08-27 DIAGNOSIS — R51 Headache: Secondary | ICD-10-CM

## 2012-08-27 DIAGNOSIS — M5030 Other cervical disc degeneration, unspecified cervical region: Secondary | ICD-10-CM

## 2012-08-27 DIAGNOSIS — M479 Spondylosis, unspecified: Secondary | ICD-10-CM

## 2012-08-27 DIAGNOSIS — Z8679 Personal history of other diseases of the circulatory system: Secondary | ICD-10-CM

## 2012-08-27 DIAGNOSIS — M75120 Complete rotator cuff tear or rupture of unspecified shoulder, not specified as traumatic: Secondary | ICD-10-CM

## 2012-08-27 DIAGNOSIS — T887XXA Unspecified adverse effect of drug or medicament, initial encounter: Secondary | ICD-10-CM

## 2012-08-27 DIAGNOSIS — F430 Acute stress reaction: Secondary | ICD-10-CM

## 2012-08-27 DIAGNOSIS — F419 Anxiety disorder, unspecified: Secondary | ICD-10-CM

## 2012-08-27 DIAGNOSIS — N259 Disorder resulting from impaired renal tubular function, unspecified: Secondary | ICD-10-CM

## 2012-08-27 DIAGNOSIS — M25551 Pain in right hip: Secondary | ICD-10-CM

## 2012-08-27 DIAGNOSIS — E119 Type 2 diabetes mellitus without complications: Secondary | ICD-10-CM | POA: Diagnosis present

## 2012-08-27 LAB — COMPREHENSIVE METABOLIC PANEL
ALT: 14 U/L (ref 0–35)
AST: 20 U/L (ref 0–37)
Albumin: 4.1 g/dL (ref 3.5–5.2)
Alkaline Phosphatase: 59 U/L (ref 39–117)
Glucose, Bld: 166 mg/dL — ABNORMAL HIGH (ref 70–99)
Potassium: 4.4 mEq/L (ref 3.5–5.1)
Sodium: 140 mEq/L (ref 135–145)
Total Protein: 7.1 g/dL (ref 6.0–8.3)

## 2012-08-27 LAB — CBC
HCT: 32.8 % — ABNORMAL LOW (ref 36.0–46.0)
HCT: 35 % — ABNORMAL LOW (ref 36.0–46.0)
Hemoglobin: 11.3 g/dL — ABNORMAL LOW (ref 12.0–15.0)
Hemoglobin: 12.2 g/dL (ref 12.0–15.0)
MCH: 30.1 pg (ref 26.0–34.0)
MCH: 30.2 pg (ref 26.0–34.0)
MCH: 30.1 pg (ref 26.0–34.0)
MCHC: 34 g/dL (ref 30.0–36.0)
MCHC: 34 g/dL (ref 30.0–36.0)
MCV: 87.5 fL (ref 78.0–100.0)
MCV: 88.8 fL (ref 78.0–100.0)
MCV: 88.3 fL (ref 78.0–100.0)
Platelets: 266 10*3/uL (ref 150–400)
RBC: 3.75 MIL/uL — ABNORMAL LOW (ref 3.87–5.11)
RDW: 13.8 % (ref 11.5–15.5)
RDW: 13.9 % (ref 11.5–15.5)
RDW: 13.9 % (ref 11.5–15.5)
WBC: 12.9 10*3/uL — ABNORMAL HIGH (ref 4.0–10.5)
WBC: 8.3 10*3/uL (ref 4.0–10.5)
WBC: 9.3 10*3/uL (ref 4.0–10.5)

## 2012-08-27 LAB — CBC WITH DIFFERENTIAL/PLATELET
Basophils Absolute: 0 K/uL (ref 0.0–0.1)
Basophils Relative: 0 % (ref 0–1)
Eosinophils Absolute: 0.3 K/uL (ref 0.0–0.7)
Eosinophils Relative: 3 % (ref 0–5)
HCT: 35.9 % — ABNORMAL LOW (ref 36.0–46.0)
Hemoglobin: 12.3 g/dL (ref 12.0–15.0)
Lymphocytes Relative: 20 % (ref 12–46)
Lymphs Abs: 2.5 K/uL (ref 0.7–4.0)
MCH: 30 pg (ref 26.0–34.0)
MCHC: 34.3 g/dL (ref 30.0–36.0)
MCV: 87.6 fL (ref 78.0–100.0)
Monocytes Absolute: 0.8 K/uL (ref 0.1–1.0)
Monocytes Relative: 6 % (ref 3–12)
Neutro Abs: 8.8 K/uL — ABNORMAL HIGH (ref 1.7–7.7)
Neutrophils Relative %: 71 % (ref 43–77)
Platelets: 258 K/uL (ref 150–400)
RBC: 4.1 MIL/uL (ref 3.87–5.11)
RDW: 13.7 % (ref 11.5–15.5)
WBC: 12.4 K/uL — ABNORMAL HIGH (ref 4.0–10.5)

## 2012-08-27 LAB — TYPE AND SCREEN
ABO/RH(D): A NEG
Antibody Screen: NEGATIVE

## 2012-08-27 LAB — GLUCOSE, CAPILLARY
Glucose-Capillary: 149 mg/dL — ABNORMAL HIGH (ref 70–99)
Glucose-Capillary: 129 mg/dL — ABNORMAL HIGH (ref 70–99)

## 2012-08-27 LAB — PROTIME-INR
INR: 1.07 (ref 0.00–1.49)
Prothrombin Time: 13.8 s (ref 11.6–15.2)

## 2012-08-27 LAB — APTT: aPTT: 30 seconds (ref 24–37)

## 2012-08-27 MED ORDER — PANTOPRAZOLE SODIUM 40 MG IV SOLR
40.0000 mg | Freq: Two times a day (BID) | INTRAVENOUS | Status: DC
  Administered 2012-08-27 (×2): 40 mg via INTRAVENOUS
  Filled 2012-08-27 (×4): qty 40

## 2012-08-27 MED ORDER — ONDANSETRON HCL 4 MG/2ML IJ SOLN: 4.0000 mg | Freq: Four times a day (QID) | INTRAMUSCULAR | Status: DC | PRN

## 2012-08-27 MED ORDER — ACETAMINOPHEN 325 MG PO TABS
650.0000 mg | ORAL_TABLET | Freq: Four times a day (QID) | ORAL | Status: DC | PRN
  Administered 2012-08-27 – 2012-08-28 (×2): 650 mg via ORAL
  Filled 2012-08-27 (×2): qty 2

## 2012-08-27 MED ORDER — SODIUM CHLORIDE 0.9 % IV SOLN
INTRAVENOUS | Status: AC
  Administered 2012-08-27: 10:00:00 via INTRAVENOUS

## 2012-08-27 MED ORDER — ONDANSETRON HCL 4 MG PO TABS: 4.0000 mg | ORAL_TABLET | Freq: Four times a day (QID) | ORAL | Status: DC | PRN

## 2012-08-27 MED ORDER — PEG 3350-KCL-NA BICARB-NACL 420 G PO SOLR
4000.0000 mL | Freq: Once | ORAL | Status: AC
  Administered 2012-08-27: 4000 mL via ORAL
  Filled 2012-08-27: qty 4000

## 2012-08-27 MED ORDER — LORAZEPAM 0.5 MG PO TABS: 0.5000 mg | ORAL_TABLET | Freq: Four times a day (QID) | ORAL | Status: DC | PRN

## 2012-08-27 MED ORDER — SODIUM CHLORIDE 0.9 % IV SOLN
INTRAVENOUS | Status: DC
  Administered 2012-08-28: 250 mL via INTRAVENOUS

## 2012-08-27 MED ORDER — SODIUM CHLORIDE 0.9 % IJ SOLN: 3.0000 mL | Freq: Two times a day (BID) | INTRAMUSCULAR | Status: DC

## 2012-08-27 MED ORDER — ACETAMINOPHEN 650 MG RE SUPP: 650.0000 mg | Freq: Four times a day (QID) | RECTAL | Status: DC | PRN

## 2012-08-27 MED ORDER — INSULIN ASPART 100 UNIT/ML ~~LOC~~ SOLN
0.0000 [IU] | SUBCUTANEOUS | Status: DC
  Administered 2012-08-27 (×3): 1 [IU] via SUBCUTANEOUS

## 2012-08-27 NOTE — Consult Note (Signed)
Unassigned Consult  Reason for Consult: Hematochezia Referring Physician: [facility]  Huston Foley HPI: This is an 78 year old [name] with multiple medical problems admitted for painless hematochezia.  She denies any prior issues with hematochezia and there is no history of a colonoscopy.  The patient denies any abdominal pain, nausea, vomiting, fevers, chills, etc.  While in the hospital she had two more hematochezia events.  No use of any NSAIDs.  Past Medical History  Diagnosis Date  . Allergy     allergic rhinitis  . Diabetes mellitus     type II  . Hypertension   . Osteopenia   . Renal insufficiency   . TIA (transient ischemic attack)   . Contact dermatitis     on buttocks  . Pyelonephritis   . Nosebleed   . Anxiety     Past Surgical History  Procedure Date  . Rotator cuff repair 06/2007    right  . Abdominal hysterectomy   . Back surgery 03/1999    disk  . Breast surgery     breast biopsy x2 negative  . Shoulder surgery 2007    left  . Joint replacement     Rt knee patella replacement    Family History  Problem Relation Age of Onset  . Stroke Mother   . Stroke Father     Social History:  reports that she has never smoked. She does not have any smokeless tobacco history on file. She reports that she does not drink alcohol or use illicit drugs.  Allergies:  Allergies  Allergen Reactions  . Amoxicillin     REACTION: aching all over  . Atorvastatin     REACTION: increased CPK  . Cephalexin     REACTION: weak , nauseated and headache  . Cetirizine Hcl     REACTION: makes her head feel big  . Codeine     REACTION: rash  . Cortisone     REACTION: increased bp  . Ezetimibe-Simvastatin   . Fentanyl     REACTION: reaction - stinging in her skin  . Guaifenesin   . Ketorolac Tromethamine     REACTION: no work  . Lisinopril     REACTION: cough - ace related  . Metformin     REACTION: nosebleeds  . Nsaids     REACTION: nosebleeds  .  Penicillins     REACTION: nausea and h/a  . Rofecoxib     REACTION: itching  . Rosuvastatin     REACTION: myalgia  . Simvastatin Other (See Comments)    unknown  . Spironolactone     REACTION: nausea    Medications:  Scheduled:   . insulin aspart  0-9 Units Subcutaneous Q4H  . pantoprazole (PROTONIX) IV  40 mg Intravenous Q12H  . sodium chloride  3 mL Intravenous Q12H   Continuous:   . sodium chloride 75 mL/hr at 08/27/12 0949    Results for orders placed during the hospital encounter of 08/27/12 (from the past 24 hour(s))  COMPREHENSIVE METABOLIC PANEL     Status: Abnormal   Collection Time   08/27/12 12:35 AM      Component Value Range   Sodium 140  135 - 145 mEq/L   Potassium 4.4  3.5 - 5.1 mEq/L   Chloride 103  96 - 112 mEq/L   CO2 24  19 - 32 mEq/L   Glucose, Bld 166 (*) 70 - 99 mg/dL   BUN 29 (*) 6 - 23 mg/dL  Creatinine, Ser 1.27 (*) 0.50 - 1.10 mg/dL   Calcium 16.1  8.4 - 09.6 mg/dL   Total Protein 7.1  6.0 - 8.3 g/dL   Albumin 4.1  3.5 - 5.2 g/dL   AST 20  0 - 37 U/L   ALT 14  0 - 35 U/L   Alkaline Phosphatase 59  39 - 117 U/L   Total Bilirubin 0.4  0.3 - 1.2 mg/dL   GFR calc non Af Amer 39 (*) >90 mL/min   GFR calc Af Amer 45 (*) >90 mL/min  CBC WITH DIFFERENTIAL     Status: Abnormal   Collection Time   08/27/12 12:35 AM      Component Value Range   WBC 12.4 (*) 4.0 - 10.5 K/uL   RBC 4.10  3.87 - 5.11 MIL/uL   Hemoglobin 12.3  12.0 - 15.0 g/dL   HCT 04.5 (*) 40.9 - 81.1 %   MCV 87.6  78.0 - 100.0 fL   MCH 30.0  26.0 - 34.0 pg   MCHC 34.3  30.0 - 36.0 g/dL   RDW 91.4  78.2 - 95.6 %   Platelets 258  150 - 400 K/uL   Neutrophils Relative 71  43 - 77 %   Neutro Abs 8.8 (*) 1.7 - 7.7 K/uL   Lymphocytes Relative 20  12 - 46 %   Lymphs Abs 2.5  0.7 - 4.0 K/uL   Monocytes Relative 6  3 - 12 %   Monocytes Absolute 0.8  0.1 - 1.0 K/uL   Eosinophils Relative 3  0 - 5 %   Eosinophils Absolute 0.3  0.0 - 0.7 K/uL   Basophils Relative 0  0 - 1 %    Basophils Absolute 0.0  0.0 - 0.1 K/uL  APTT     Status: Normal   Collection Time   08/27/12 12:35 AM      Component Value Range   aPTT 30  24 - 37 seconds  PROTIME-INR     Status: Normal   Collection Time   08/27/12 12:35 AM      Component Value Range   Prothrombin Time 13.8  11.6 - 15.2 seconds   INR 1.07  0.00 - 1.49  TYPE AND SCREEN     Status: Normal   Collection Time   08/27/12 12:45 AM      Component Value Range   ABO/RH(D) A NEG     Antibody Screen NEG     Sample Expiration 08/30/2012    CBC     Status: Abnormal   Collection Time   08/27/12  3:41 AM      Component Value Range   WBC 12.9 (*) 4.0 - 10.5 K/uL   RBC 3.75 (*) 3.87 - 5.11 MIL/uL   Hemoglobin 11.3 (*) 12.0 - 15.0 g/dL   HCT 21.3 (*) 08.6 - 57.8 %   MCV 87.5  78.0 - 100.0 fL   MCH 30.1  26.0 - 34.0 pg   MCHC 34.5  30.0 - 36.0 g/dL   RDW 46.9  62.9 - 52.8 %   Platelets 284  150 - 400 K/uL  GLUCOSE, CAPILLARY     Status: Abnormal   Collection Time   08/27/12  9:36 AM      Component Value Range   Glucose-Capillary 149 (*) 70 - 99 mg/dL  CBC     Status: Abnormal   Collection Time   08/27/12 10:27 AM      Component Value Range   WBC  8.3  4.0 - 10.5 K/uL   RBC 3.94  3.87 - 5.11 MIL/uL   Hemoglobin 11.9 (*) 12.0 - 15.0 g/dL   HCT 56.2 (*) 13.0 - 86.5 %   MCV 88.8  78.0 - 100.0 fL   MCH 30.2  26.0 - 34.0 pg   MCHC 34.0  30.0 - 36.0 g/dL   RDW 78.4  69.6 - 29.5 %   Platelets 259  150 - 400 K/uL  MRSA PCR SCREENING     Status: Normal   Collection Time   08/27/12 10:47 AM      Component Value Range   MRSA by PCR NEGATIVE  NEGATIVE  GLUCOSE, CAPILLARY     Status: Abnormal   Collection Time   08/27/12 11:31 AM      Component Value Range   Glucose-Capillary 129 (*) 70 - 99 mg/dL     No results found.  ROS:  As stated above in the HPI otherwise negative.  Blood pressure 122/41, pulse 65, temperature 97.8 F (36.6 C), temperature source Oral, resp. rate 16, height 5\' 6"  (1.676 m), weight 102.059 kg  (225 lb), SpO2 98.00%.    PE: Gen: NAD, Alert and Oriented HEENT:  [place]/AT, EOMI Neck: Supple, no LAD Lungs: CTA Bilaterally CV: RRR without M/G/R ABM: Soft, NTND, +BS Ext: No C/C/E  Assessment/Plan: 1) Hematochezia   I suspect that she has a diverticular bleed.  Since she never had a colonoscopy in the past I will prep her for the procedure tomorrow.  Plan: 1) Colonoscopy tomorrow.  Nayeliz Hipp D 08/27/2012, 2:24 PM

## 2012-08-27 NOTE — H&P (Signed)
PCP:  She recently changed her PCP an is going to some one in [place].    Chief Complaint:   Blood in stool HPI: Linda [name] is a 78 y.o. female   has a past medical history of Allergy; Diabetes mellitus; Hypertension; Osteopenia; Renal insufficiency; TIA (transient ischemic attack); Contact dermatitis; Pyelonephritis; Nosebleed; and Anxiety.   Presented with  Yesterday around 8 pm she went to the bathroom and had a large amount of bloody stool. She describes it as the largest amount of blood she had never seen. She had an another movement very soon thereafter. But since that time had not had any more bowel movements.  She denies feeling light headed. No abdominal pain. Never had this in the past. Never had a colonoscopy. Denies any nausea or vomiting. She as not been taking any NSAIDS.  Currently hemodynamically stable. Emergency department repeat CBC showed decrease in hemoglobin from 12.3 to 11.3 over 3 hours.  Review of Systems:    Pertinent positives include:blood in stool,   Constitutional:  No weight loss, night sweats, Fevers, chills, fatigue, weight loss  HEENT:  No headaches, Difficulty swallowing,Tooth/dental problems,Sore throat,  No sneezing, itching, ear ache, nasal congestion, post nasal drip,  Cardio-vascular:  No chest pain, Orthopnea, PND, anasarca, dizziness, palpitations.no Bilateral lower extremity swelling  GI:  No heartburn, indigestion, abdominal pain, nausea, vomiting, diarrhea, change in bowel habits, loss of appetite, melena, hematemesis Resp:  no shortness of breath at rest. No dyspnea on exertion, No excess mucus, no productive cough, No non-productive cough, No coughing up of blood.No change in color of mucus.No wheezing. Skin:  no rash or lesions. No jaundice GU:  no dysuria, change in color of urine, no urgency or frequency. No straining to urinate.  No flank pain.  Musculoskeletal:  No joint pain or no joint swelling. No decreased range of  motion. No back pain.  Psych:  No change in mood or affect. No depression or anxiety. No memory loss.  Neuro: no localizing neurological complaints, no tingling, no weakness, no double vision, no gait abnormality, no slurred speech, no confusion  Otherwise ROS are negative except for above, 10 systems were reviewed  Past Medical History: Past Medical History  Diagnosis Date  . Allergy     allergic rhinitis  . Diabetes mellitus     type II  . Hypertension   . Osteopenia   . Renal insufficiency   . TIA (transient ischemic attack)   . Contact dermatitis     on buttocks  . Pyelonephritis   . Nosebleed   . Anxiety    Past Surgical History  Procedure Date  . Rotator cuff repair 06/2007    right  . Abdominal hysterectomy   . Back surgery 03/1999    disk  . Breast surgery     breast biopsy x2 negative  . Shoulder surgery 2007    left  . Joint replacement     Rt knee patella replacement     Medications: Prior to Admission medications   Medication Sig Start Date End Date Taking? Authorizing Provider  acetaminophen (TYLENOL ARTHRITIS PAIN) 650 MG CR tablet Take 650 mg by mouth every 8 (eight) hours as needed. for pain    Historical Provider, MD  Calcium Carbonate-Vitamin D 600-400 MG-UNIT per tablet Take 1 tablet by mouth daily.      Historical Provider, MD  Cinnamon 500 MG capsule Take 1,000 mg by mouth daily.     Historical Provider, MD  diclofenac  sodium (VOLTAREN) 1 % GEL Apply 1 application topically 4 (four) times daily as needed.     Historical Provider, MD  fluticasone (CUTIVATE) 0.05 % cream Apply topically daily as needed. 08/15/11   Marne A Tower, MD  glucose blood test strip Ck glucose three times daily and as needed.     Historical Provider, MD  LORazepam (ATIVAN) 0.5 MG tablet TAKE ONE TABLET DAILY AS NEEDED FOR     ANXIETY 08/16/12   Judy Pimple, MD  losartan (COZAAR) 25 MG tablet TAKE ONE (1) TABLET BY MOUTH EVERY      DAY 08/16/12   Marne A Tower, MD  Lysine  500 MG TABS Take 1 tablet by mouth daily.      Historical Provider, MD  Omega-3 Fatty Acids (FISH OIL PO) Take 1 capsule by mouth 2 (two) times daily.      Historical Provider, MD  Potassium 75 MG TABS Take 1 tablet by mouth daily as needed.      Historical Provider, MD    Allergies:   Allergies  Allergen Reactions  . Amoxicillin     REACTION: aching all over  . Atorvastatin     REACTION: increased CPK  . Cephalexin     REACTION: weak , nauseated and headache  . Cetirizine Hcl     REACTION: makes her head feel big  . Codeine     REACTION: rash  . Cortisone     REACTION: increased bp  . Ezetimibe-Simvastatin   . Fentanyl     REACTION: reaction - stinging in her skin  . Guaifenesin   . Ketorolac Tromethamine     REACTION: no work  . Lisinopril     REACTION: cough - ace related  . Metformin     REACTION: nosebleeds  . Nsaids     REACTION: nosebleeds  . Penicillins     REACTION: nausea and h/a  . Rofecoxib     REACTION: itching  . Rosuvastatin     REACTION: myalgia  . Simvastatin Other (See Comments)    unknown  . Spironolactone     REACTION: nausea    Social History:  Ambulatory independently  Lives at   Home with family   reports that she has never smoked. She does not have any smokeless tobacco history on file. She reports that she does not drink alcohol or use illicit drugs.   Family History: family history includes Stroke in her father and mother.    Physical Exam: Patient Vitals for the past 24 hrs:  BP Temp Temp src Pulse Resp SpO2  08/27/12 0400 135/63 mmHg - - 75  19  94 %  08/27/12 0306 120/43 mmHg - - - 16  95 %  08/27/12 0300 120/43 mmHg - - 71  18  90 %  08/27/12 0200 137/55 mmHg - - 76  20  92 %  08/27/12 0054 125/39 mmHg - - 77  16  98 %  08/26/12 2209 159/59 mmHg 97.8 F (36.6 C) Oral 85  16  100 %    1. General:  in No Acute distress 2. Psychological: Alert and  Oriented 3. Head/ENT:     Dry Mucous Membranes                           Head Non traumatic, neck supple  Normal  Dentition 4. SKIN:  decreased Skin turgor,  Skin clean Dry and intact no rash 5. Heart: Regular rate and rhythm no Murmur, Rub or gallop 6. Lungs: Clear to auscultation bilaterally, no wheezes or crackles   7. Abdomen: Soft, non-tender, Non distended 8. Lower extremities: no clubbing, cyanosis, or edema 9. Neurologically Grossly intact, moving all 4 extremities equally 10. MSK: Normal range of motion As per  ER M.D. no stool in rectal vault. mucus the posterior was Hemoccult negative. body mass index is unknown because there is no height or weight on file.   Labs on Admission:   [facility] 08/27/12 0035  NA 140  K 4.4  CL 103  CO2 24  GLUCOSE 166*  BUN 29*  CREATININE 1.27*  CALCIUM 10.1  MG --  PHOS --    Basename 08/27/12 0035  AST 20  ALT 14  ALKPHOS 59  BILITOT 0.4  PROT 7.1  ALBUMIN 4.1   No results found for this basename: LIPASE:2,AMYLASE:2 in the last 72 hours  Basename 08/27/12 0341 08/27/12 0035  WBC 12.9* 12.4*  NEUTROABS -- 8.8*  HGB 11.3* 12.3  HCT 32.8* 35.9*  MCV 87.5 87.6  PLT 284 258   No results found for this basename: CKTOTAL:3,CKMB:3,CKMBINDEX:3,TROPONINI:3 in the last 72 hours No results found for this basename: TSH,T4TOTAL,FREET3,T3FREE,THYROIDAB in the last 72 hours No results found for this basename: VITAMINB12:2,FOLATE:2,FERRITIN:2,TIBC:2,IRON:2,RETICCTPCT:2 in the last 72 hours Lab Results  Component Value Date   HGBA1C 6.3 11/19/2010    The CrCl is unknown because both a height and weight (above a minimum accepted value) are required for this calculation. ABG No results found for this basename: phart, pco2, po2, hco3, tco2, acidbasedef, o2sat     No results found for this basename: DDIMER     Other results:  I have pearsonaly reviewed this: ECG REPORT  Rate: 69  Rhythm: NSR ST&T Change: no ischemic changes   Cultures:    Component Value Date/Time   SDES  SYNOVIAL FLUID SHOULDER RIGHT 02/15/2009 0844   SDES SYNOVIAL FLUID SHOULDER RIGHT 02/15/2009 0844   SDES SYNOVIAL FLUID SHOULDER RIGHT 02/15/2009 0844   SPECREQUEST PT ON CEFAZOLIN FLUID ON SWAB 02/15/2009 0844   SPECREQUEST FLUID ON SWAB PT ON CEFAZOLIN 02/15/2009 0844   SPECREQUEST FLUID ON SWAB PT ON CEFAZOLIN 02/15/2009 0844   CULT NO GROWTH 3 DAYS 02/15/2009 0844   CULT NO ANAEROBES ISOLATED 02/15/2009 0844   REPTSTATUS 02/18/2009 FINAL 02/15/2009 0844   REPTSTATUS 02/20/2009 FINAL 02/15/2009 0844   REPTSTATUS 02/15/2009 FINAL 02/15/2009 0844       Radiological Exams on Admission: No results found.  Chart has been reviewed  Assessment/Plan  78 year old female with no prior history of old GI bleed and no prior colonoscopies presents with bloody bowel movement x2   Present on Admission:  .Blood in stool - likely a lower GI bleed would recommend to call GI in a.m. Obtain serial CBC. Observe in stepdown. Currently hemodynamically stable  .DIABETES MELLITUS, TYPE II - sliding scale  .HYPERTENSION - hold blood pressure medications for now   .Anemia - continue to monitor frequent CBCs at this point no indication for transfusion. Patient is certain that she had had a bloody bowel movement. Although negative Hemoccult stool is unusual. If GI workup unremarkable to also consider other sources.   Prophylaxis: SCD   Protonix  CODE STATUS: FULL CODE  Other plan as per orders.  I have spent a total of 55 min on this admission  Lirio Bach  08/27/2012, 6:00 AM

## 2012-08-27 NOTE — Progress Notes (Signed)
Dr. Benjamine Mola, on call for team 8 called and notified of pt arrival to 2503 per order, Berle Mull RN

## 2012-08-27 NOTE — ED Provider Notes (Addendum)
History     CSN: [id number]  Arrival date & time 08/26/12  2203   First MD Initiated Contact with Patient 08/27/12 0038      Chief Complaint  Patient presents with  . Rectal Bleeding    (Consider location/radiation/quality/duration/timing/severity/associated sxs/prior treatment) HPI  Patient is an elderly woman who presents after two episodes of hematochezia. She reports that she went to urinate around 2100 but, also passed stool. Mixed with stool and independent of stool was a large amount of dark red blood. The patient records "I've never seen so much blood in all my life". However, when asked to quantify the amount of blood she passed, she was unable to, beyond her aforementioned comment. He said is accompanied by her daughter did not see any blood in the commode port was flushed. The patient endorses a brief episode of lightheadedness which was mild. This resolved without intervention within a minute or 2 and has not recurred. The patient has not experienced abdominal pain, chest pain or shortness of breath.  She denies any history of GI bleeding. She does not believe that she has ever undergone screening colonoscopy. She takes an 81 mg aspirin every day but no other antiplatelets or anticoagulants. Past Medical History  Diagnosis Date  . Allergy     allergic rhinitis  . Diabetes mellitus     type II  . Hypertension   . Osteopenia   . Renal insufficiency   . TIA (transient ischemic attack)   . Contact dermatitis     on buttocks  . Pyelonephritis   . Nosebleed   . Anxiety     Past Surgical History  Procedure Date  . Rotator cuff repair 06/2007    right  . Abdominal hysterectomy   . Back surgery 03/1999    disk  . Breast surgery     breast biopsy x2 negative  . Shoulder surgery 2007    left  . Joint replacement     Rt knee patella replacement    Family History  Problem Relation Age of Onset  . Stroke Mother   . Stroke Father     History  Substance Use  Topics  . Smoking status: Never Smoker   . Smokeless tobacco: Not on file  . Alcohol Use: No    OB History    Grav Para Term Preterm Abortions TAB SAB Ect Mult Living                  Review of Systems  Gen: no weight loss, fevers, chills, night sweats Eyes: no discharge or drainage, no occular pain or visual changes Nose: no epistaxis or rhinorrhea Mouth: no dental pain, no sore throat Neck: no neck pain Lungs: no SOB, cough, wheezing CV: no chest pain, palpitations, dependent edema or orthopnea Abd: As per history of present illness, otherwise negative GU: no dysuria or gross hematuria MSK: Chronic Low-back pain, otherwise negative Neuro: no headache, no focal neurologic deficits Skin: no rash Psyche: negative.  Allergies  Amoxicillin; Atorvastatin; Cephalexin; Cetirizine hcl; Codeine; Cortisone; Ezetimibe-simvastatin; Fentanyl; Guaifenesin; Ketorolac tromethamine; Lisinopril; Metformin; Nsaids; Penicillins; Rofecoxib; Rosuvastatin; Simvastatin; and Spironolactone  Home Medications   Current Outpatient Rx  Name Route Sig Dispense Refill  . ACETAMINOPHEN ER 650 MG PO TBCR Oral Take 650 mg by mouth every 8 (eight) hours as needed. for pain    . ASPIRIN EC 81 MG PO TBEC Oral Take 81 mg by mouth daily.      Marland Kitchen CALCIUM CARBONATE-VITAMIN D 600-400 MG-UNIT  PO TABS Oral Take 1 tablet by mouth daily.      Marland Kitchen CINNAMON 500 MG PO CAPS Oral Take 1,000 mg by mouth daily.     Marland Kitchen DICLOFENAC SODIUM 1 % TD GEL Topical Apply 1 application topically 4 (four) times daily as needed.     Marland Kitchen FLUTICASONE PROPIONATE 0.05 % EX CREA Topical Apply topically daily as needed. 30 g 0  . FUROSEMIDE 20 MG PO TABS Oral Take 20 mg by mouth as needed.    Marland Kitchen GLUCOSE BLOOD VI STRP  Ck glucose three times daily and as needed.     Marland Kitchen LORAZEPAM 0.5 MG PO TABS  TAKE ONE TABLET DAILY AS NEEDED FOR     ANXIETY 30 tablet 3  . LOSARTAN POTASSIUM 25 MG PO TABS  TAKE ONE (1) TABLET BY MOUTH EVERY      DAY 30 tablet 11  .  LYSINE 500 MG PO TABS Oral Take 1 tablet by mouth daily.      Marland Kitchen FISH OIL PO Oral Take 1 capsule by mouth 2 (two) times daily.      Marland Kitchen POTASSIUM 75 MG PO TABS Oral Take 1 tablet by mouth daily as needed.        BP 125/39  Pulse 77  Temp 97.8 F (36.6 C) (Oral)  Resp 16  SpO2 98%  Physical ExamGen: well developed and well nourished appearing Head: NCAT Eyes: PERL, EOMI Nose: no epistaixis or rhinorrhea Mouth/throat: mucosa is moist and pink Neck: supple, no stridor Lungs: CTA B, no wheezing, rhonchi or rales Abd: soft, notender, nondistended Rectal: no stool in vault, mucous is heme negative.  Back: no ttp, no cva ttp, + kyphosis Skin: no rashese, wnl Neuro: CN ii-xii grossly intact, no focal deficits Psyche; normal affect,  calm and cooperative.   ED Course  Procedures (including critical care time)  Results for orders placed during the hospital encounter of 08/27/12 (from the past 24 hour(s))  COMPREHENSIVE METABOLIC PANEL     Status: Abnormal   Collection Time   08/27/12 12:35 AM      Component Value Range   Sodium 140  135 - 145 mEq/L   Potassium 4.4  3.5 - 5.1 mEq/L   Chloride 103  96 - 112 mEq/L   CO2 24  19 - 32 mEq/L   Glucose, Bld 166 (*) 70 - 99 mg/dL   BUN 29 (*) 6 - 23 mg/dL   Creatinine, Ser 1.61 (*) 0.50 - 1.10 mg/dL   Calcium 09.6  8.4 - 04.5 mg/dL   Total Protein 7.1  6.0 - 8.3 g/dL   Albumin 4.1  3.5 - 5.2 g/dL   AST 20  0 - 37 U/L   ALT 14  0 - 35 U/L   Alkaline Phosphatase 59  39 - 117 U/L   Total Bilirubin 0.4  0.3 - 1.2 mg/dL   GFR calc non Af Amer 39 (*) >90 mL/min   GFR calc Af Amer 45 (*) >90 mL/min  CBC WITH DIFFERENTIAL     Status: Abnormal   Collection Time   08/27/12 12:35 AM      Component Value Range   WBC 12.4 (*) 4.0 - 10.5 K/uL   RBC 4.10  3.87 - 5.11 MIL/uL   Hemoglobin 12.3  12.0 - 15.0 g/dL   HCT 40.9 (*) 81.1 - 91.4 %   MCV 87.6  78.0 - 100.0 fL   MCH 30.0  26.0 - 34.0 pg   MCHC 34.3  30.0 - 36.0 g/dL   RDW 13.2  44.0 -  10.2 %   Platelets 258  150 - 400 K/uL   Neutrophils Relative 71  43 - 77 %   Neutro Abs 8.8 (*) 1.7 - 7.7 K/uL   Lymphocytes Relative 20  12 - 46 %   Lymphs Abs 2.5  0.7 - 4.0 K/uL   Monocytes Relative 6  3 - 12 %   Monocytes Absolute 0.8  0.1 - 1.0 K/uL   Eosinophils Relative 3  0 - 5 %   Eosinophils Absolute 0.3  0.0 - 0.7 K/uL   Basophils Relative 0  0 - 1 %   Basophils Absolute 0.0  0.0 - 0.1 K/uL  APTT     Status: Normal   Collection Time   08/27/12 12:35 AM      Component Value Range   aPTT 30  24 - 37 seconds  PROTIME-INR     Status: Normal   Collection Time   08/27/12 12:35 AM      Component Value Range   Prothrombin Time 13.8  11.6 - 15.2 seconds   INR 1.07  0.00 - 1.49  TYPE AND SCREEN     Status: Normal   Collection Time   08/27/12 12:45 AM      Component Value Range   ABO/RH(D) A NEG     Antibody Screen NEG     Sample Expiration 08/30/2012     EKG: nsr, no acute ischemic changes, normal intervals, normal axis, normal qrs complex      MDM  Patient with report of significant amount of hematochezia within 2h of presentation. DDX: AVM, diverticulosis, tumor, colitis.  No blood in rectum and mucous is heme negative. H/H are wnl and the patient has a benign abdominal exam. We will observe and repeat H/H in 4 hrs then re-evaluate for most appropriate disposition.         Brandt Loosen, MD 08/27/12 0820  Case discussed with hospitalist who will see and evaluate for admission.   Brandt Loosen, MD 08/27/12 [phone]

## 2012-08-27 NOTE — Progress Notes (Signed)
[facility]                                 Progress Note   I have seen and examined pt admitted this am following 2 episodes of painless hematochezia, hgb so far stable. I have consulted GI for further recs, Dr Hung to. She denies any c/o this am, will o/w continue current management plan as per Dr Doutova.  Madalynne Gutmann MD [phone] 

## 2012-08-28 ENCOUNTER — Encounter (HOSPITAL_COMMUNITY): Payer: Self-pay | Admitting: *Deleted

## 2012-08-28 ENCOUNTER — Encounter (HOSPITAL_COMMUNITY)
Admission: EM | Disposition: A | Discharge status: Home / Self Care | Payer: Self-pay | Source: Home / Self Care | Attending: Internal Medicine

## 2012-08-28 DIAGNOSIS — I10 Essential (primary) hypertension: Secondary | ICD-10-CM

## 2012-08-28 HISTORY — PX: COLONOSCOPY: SHX5424

## 2012-08-28 LAB — CBC WITH DIFFERENTIAL/PLATELET
Basophils Absolute: 0.1 10*3/uL (ref 0.0–0.1)
Basophils Relative: 1 % (ref 0–1)
Eosinophils Absolute: 0.4 10*3/uL (ref 0.0–0.7)
Eosinophils Relative: 4 % (ref 0–5)
HCT: 35.1 % — ABNORMAL LOW (ref 36.0–46.0)
Lymphocytes Relative: 26 % (ref 12–46)
MCH: 30.1 pg (ref 26.0–34.0)
MCHC: 34.2 g/dL (ref 30.0–36.0)
MCV: 88 fL (ref 78.0–100.0)
Monocytes Absolute: 0.7 10*3/uL (ref 0.1–1.0)
Platelets: 221 10*3/uL (ref 150–400)
RDW: 13.9 % (ref 11.5–15.5)

## 2012-08-28 LAB — GLUCOSE, CAPILLARY
Glucose-Capillary: 127 mg/dL — ABNORMAL HIGH (ref 70–99)
Glucose-Capillary: 113 mg/dL — ABNORMAL HIGH (ref 70–99)
Glucose-Capillary: 114 mg/dL — ABNORMAL HIGH (ref 70–99)

## 2012-08-28 LAB — COMPREHENSIVE METABOLIC PANEL
ALT: 14 U/L (ref 0–35)
Alkaline Phosphatase: 55 U/L (ref 39–117)
BUN: 19 mg/dL (ref 6–23)
CO2: 21 mEq/L (ref 19–32)
GFR calc Af Amer: 53 mL/min — ABNORMAL LOW (ref >90)
GFR calc non Af Amer: 46 mL/min — ABNORMAL LOW (ref >90)
Glucose, Bld: 130 mg/dL — ABNORMAL HIGH (ref 70–99)
Potassium: 4.2 mEq/L (ref 3.5–5.1)
Sodium: 142 mEq/L (ref 135–145)
Total Bilirubin: 0.5 mg/dL (ref 0.3–1.2)
Total Protein: 6.2 g/dL (ref 6.0–8.3)

## 2012-08-28 LAB — MAGNESIUM: Magnesium: 1.1 mg/dL — ABNORMAL LOW (ref 1.5–2.5)

## 2012-08-28 SURGERY — COLONOSCOPY
Anesthesia: Moderate Sedation

## 2012-08-28 MED ORDER — DIPHENHYDRAMINE HCL 50 MG/ML IJ SOLN
INTRAMUSCULAR | Status: DC | PRN
  Administered 2012-08-28 (×2): 12.5 mg via INTRAVENOUS

## 2012-08-28 MED ORDER — MIDAZOLAM HCL 5 MG/5ML IJ SOLN
INTRAMUSCULAR | Status: DC | PRN
  Administered 2012-08-28 (×5): 1 mg via INTRAVENOUS

## 2012-08-28 MED ORDER — DIPHENHYDRAMINE HCL 50 MG/ML IJ SOLN
INTRAMUSCULAR | Status: AC
  Filled 2012-08-28: qty 1

## 2012-08-28 MED ORDER — MIDAZOLAM HCL 5 MG/ML IJ SOLN
INTRAMUSCULAR | Status: AC
  Filled 2012-08-28: qty 2

## 2012-08-28 NOTE — Op Note (Signed)
Eligha Bridegroom [facility] [address] [place], [address]   OPERATIVE PROCEDURE REPORT  PATIENT: Linda, [name]  MR#: [id number] BIRTHDATE: 10-26-1930  GENDER: Female ENDOSCOPIST: Jeani Hawking, MD ASSISTANT:   Kandice Robinsons, Technician Janae Sauce, RN Oletha Blend, Technician PROCEDURE DATE: 08/28/2012 PROCEDURE:   Colonoscopy with snare polypectomy ASA CLASS:   Class III INDICATIONS:hematochezia. MEDICATIONS: Versed 5 mg IV and Benadryl 25 mg IV  DESCRIPTION OF PROCEDURE:   After the risks benefits and alternatives of the procedure were thoroughly explained, informed consent was obtained.  A digital rectal exam revealed no abnormalities of the rectum.    The Pentax Colonoscope [id number] endoscope was introduced through the anus  and advanced to the cecum, which was identified by both the appendix and ileocecal valve , No adverse events experienced.    The quality of the prep was excellent. .  The instrument was then slowly withdrawn as the colon was fully examined.    FINDINGS: Two 2 mm sessile polyps were found and removed with a cold sanre and a cold biopsy forcep.  A 2 cm lipoma was noted in the ascending colon.  Scattered left-sided diverticula were identified.  No evidence of any inflammation, ulcerations, erosions, masses, or vascular abnormalities..   Retroflexed views revealed internal/external hemorrhoids.     The scope was then withdrawn from the patient and the procedure terminated.  COMPLICATIONS: There were no complications.  IMPRESSION: 1) Polyps. 2) Diverticula. 3) Ascending colon lipoma.  RECOMMENDATIONS:1) Follow HGB. 2) Transfuse as necessary. 3) Await biopsy results.   _______________________________ eSignedJeani Hawking, MD 08/28/2012 11:16 AM

## 2012-08-28 NOTE — Discharge Summary (Signed)
Physician Discharge Summary  Linda [name] JXB:[id number] DOB: 1930/04/11 DOA: 08/27/2012  PCP: No primary provider on file.  Admit date: 08/27/2012 Discharge date: 08/28/2012  Recommendations for Outpatient Follow-up:      Follow-up Information    Follow up with [facility] D, MD. (in 2weeks, call for appt upon discharge)    Contact information:   [address] [place] [address] [phone]       Please follow up. (PCP, in 1week, call for appt upon discharge)          Discharge Diagnoses:  Principal Problem:  *Blood in stool Active Problems:  DIABETES MELLITUS, TYPE II  HYPERTENSION  Anemia   Discharge Condition: Improved/stable  Diet recommendation: Modified carbohydrate  Filed Weights   08/27/12 1100 08/28/12 0404  Weight: 102.059 kg (225 lb) 106 kg (233 lb 11 oz)    History of present illness:  Pt is an 78 year old white female admittedfollowing 2 episodes of painless hematochezia, hgb so far stable. She denied any abdominal pain also no nausea or vomiting. She was admitted for further evaluation and management. Please see full admission H&P.  Hospital Course by problem list:  . G. I. bleed, diverticular - As discussed above, upon admission her hemoglobins were cycled and remained stable. She did not have any further rectal bleeding. GI was consulted and Dr. Elnoria Howard saw patient and she was taken for colonoscopy this morning and it showed diverticula, polyps and and ascending colon lipoma. Per Dr. Elnoria Howard this was likely diverticular bleed that has now resolved. She has not required any blood transfusions. Discussed with Dr. Elnoria Howard and okay to discharge. She is to follow up outpatient for results of the polyp biopsies. Marland KitchenDIABETES MELLITUS, TYPE II - patient was placed on sliding scale during this hospital stay. She is to follow up outpatient.  Marland KitchenHYPERTENSION - her outpatient medications were held while in the hospital secondary to #1, she is to continue  outpatient meds upon discharge.   .Anemia -secondary to #1, her hemoglobin was monitored and remained stable  Procedures:  Colonoscopy- per Dr. Elnoria Howard, revealed polyps diverticula in the ascending colon lipoma. Polyps biopsied.  Consultations:  GI  Discharge Exam: Filed Vitals:   08/28/12 1115 08/28/12 1130 08/28/12 1145 08/28/12 1205  BP: 140/51 152/58 154/65 148/59  Pulse:    65  Temp:    97.8 F (36.6 C)  TempSrc:    Oral  Resp: 11 26 17 17   Height:      Weight:      SpO2: 100% 100% 97% 91%    General: She is alert and oriented x3, in no apparent distress Cardiovascular: Regular rate and rhythm, normal S1-S2 Respiratory:clear to auscultation bilaterally no crackles or wheezes  extremities: No cyanosis and no edema  Discharge Instructions  Discharge Orders    Future Orders Please Complete By Expires   Diet Carb Modified      Increase activity slowly          Medication List     As of 08/28/2012  1:22 PM    TAKE these medications         alendronate 70 MG tablet   Commonly known as: FOSAMAX   Take 70 mg by mouth every 7 (seven) days. Take with a full glass of water on an empty stomach.      Calcium Carbonate-Vitamin D 600-400 MG-UNIT per tablet   Take 1 tablet by mouth daily.      Cinnamon 500 MG capsule  Take 1,000 mg by mouth daily.      FISH OIL PO   Take 1 capsule by mouth 2 (two) times daily.      fluticasone 0.05 % cream   Commonly known as: CUTIVATE   Apply topically daily as needed.      furosemide 20 MG tablet   Commonly known as: LASIX   Take 20 mg by mouth daily.      glucose blood test strip   Ck glucose three times daily and as needed.      LORazepam 0.5 MG tablet   Commonly known as: ATIVAN   Take 0.5 mg by mouth daily as needed. For anxiety      losartan 25 MG tablet   Commonly known as: COZAAR   Take 25 mg by mouth daily.      Lysine 500 MG Tabs   Take 1 tablet by mouth daily.      TYLENOL ARTHRITIS PAIN 650 MG CR tablet    Generic drug: acetaminophen   Take 650 mg by mouth every 8 (eight) hours as needed. for pain           Follow-up Information    Follow up with HUNG,PATRICK D, MD. (in 2weeks, call for appt upon discharge)    Contact information:   [address] [place] [address] [phone]       Please follow up. (PCP, in 1week, call for appt upon discharge)           The results of significant diagnostics from this hospitalization (including imaging, microbiology, ancillary and laboratory) are listed below for reference.    Significant Diagnostic Studies: No results found.  Microbiology: Recent Results (from the past 240 hour(s))  MRSA PCR SCREENING     Status: Normal   Collection Time   08/27/12 10:47 AM      Component Value Range Status Comment   MRSA by PCR NEGATIVE  NEGATIVE Final      Labs: Basic Metabolic Panel:  Lab 08/28/12 9147 08/27/12 0035  NA 142 140  K 4.2 4.4  CL 107 103  CO2 21 24  GLUCOSE 130* 166*  BUN 19 29*  CREATININE 1.10 1.27*  CALCIUM 9.6 10.1  MG 1.1* --  PHOS 3.5 --   Liver Function Tests:  Lab 08/28/12 0445 08/27/12 0035  AST 17 20  ALT 14 14  ALKPHOS 55 59  BILITOT 0.5 0.4  PROT 6.2 7.1  ALBUMIN 3.5 4.1   No results found for this basename: LIPASE:5,AMYLASE:5 in the last 168 hours No results found for this basename: AMMONIA:5 in the last 168 hours CBC:  Lab 08/28/12 0655 08/27/12 2125 08/27/12 1607 08/27/12 1027 08/27/12 0341 08/27/12 0035  WBC 10.3 9.3 8.6 8.3 12.9* --  NEUTROABS 6.5 -- -- -- -- 8.8*  HGB 12.0 12.0 12.2 11.9* 11.3* --  HCT 35.1* 35.3* 35.9* 35.0* 32.8* --  MCV 88.0 88.3 88.6 88.8 87.5 --  PLT 221 266 262 259 284 --   Cardiac Enzymes: No results found for this basename: CKTOTAL:5,CKMB:5,CKMBINDEX:5,TROPONINI:5 in the last 168 hours BNP: BNP (last 3 results) No results found for this basename: PROBNP:3 in the last 8760 hours CBG:  Lab 08/28/12 1209 08/28/12 0911 08/28/12 0410 08/28/12  0004 08/27/12 2011  GLUCAP 114* 126* 113* 103* 127*    Time coordinating discharge: <30 minutes  Signed:  Georgio Hattabaugh C  [facility] 08/28/2012, 1:22 PM

## 2012-08-28 NOTE — Interval H&P Note (Signed)
History and Physical Interval Note:  08/28/2012 9:24 AM  Linda [name]  has presented today for surgery, with the diagnosis of Hematochezia  The various methods of treatment have been discussed with the patient and family. After consideration of risks, benefits and other options for treatment, the patient has consented to  Procedure(s) (LRB) with comments: COLONOSCOPY (N/A) as a surgical intervention .  The patient's history has been reviewed, patient examined, no change in status, stable for surgery.  I have reviewed the patient's chart and labs.  Questions were answered to the patient's satisfaction.     Emmy Keng D

## 2012-08-28 NOTE — H&P (View-Only) (Signed)
[facility]                                 Progress Note   I have seen and examined pt admitted this am following 2 episodes of painless hematochezia, hgb so far stable. I have consulted GI for further recs, Dr Elnoria Howard to. She denies any c/o this am, will o/w continue current management plan as per Dr Adela Glimpse.  Donnalee Curry MD [phone]

## 2012-08-30 ENCOUNTER — Encounter (HOSPITAL_COMMUNITY): Payer: Self-pay

## 2012-08-30 ENCOUNTER — Encounter (HOSPITAL_COMMUNITY): Payer: Self-pay | Admitting: Gastroenterology

## 2012-09-07 ENCOUNTER — Telehealth: Payer: Self-pay | Admitting: Cardiology

## 2012-09-07 NOTE — Telephone Encounter (Signed)
Linda [name] just called wanting to know what furosemide was for.  Discussed and answered questions

## 2012-09-07 NOTE — Telephone Encounter (Signed)
New problem:  Discuss medication.- furosemide 20 mg .

## 2012-09-16 ENCOUNTER — Other Ambulatory Visit: Payer: Self-pay | Admitting: Family Medicine

## 2012-09-30 ENCOUNTER — Telehealth: Payer: Self-pay | Admitting: Cardiology

## 2012-09-30 NOTE — Telephone Encounter (Signed)
It sounds as if the patient may have had an adverse allergic reaction to the medication prescribed for her UTI.  She will need to followup with her PCP.  The PCP he may contact us subsequently if he/she thinks that the swelling is cardiac related.

## 2012-09-30 NOTE — Telephone Encounter (Signed)
Dr Nathanial Rancher gave patient an antibiotic for bladder infection TMP/SMZ DS 160/800 MG and she had a reaction.  Has started having swelling in feet and legs. Swelling does go down when she elevates feet. Swelling started after taking antibiotic Started Allegra and is having some relief. Patient states she has tried to see PCP and they are not helping. Would like for  Dr. Patty Sermons to Rx something. Will forward to  Dr. Patty Sermons for reveiw

## 2012-09-30 NOTE — Telephone Encounter (Signed)
Advised patient

## 2012-09-30 NOTE — Telephone Encounter (Signed)
Pt feet and legs are swollen very very bad and she wants to talk to you about it because she thinks it is coming from her bladder it hurts

## 2012-11-29 ENCOUNTER — Other Ambulatory Visit: Payer: Self-pay

## 2012-11-29 MED ORDER — FUROSEMIDE 20 MG PO TABS: 20.0000 mg | ORAL_TABLET | Freq: Every day | ORAL | Status: DC

## 2013-03-28 ENCOUNTER — Emergency Department (HOSPITAL_COMMUNITY): Payer: Medicare Other

## 2013-03-28 ENCOUNTER — Encounter (HOSPITAL_COMMUNITY): Payer: Self-pay | Admitting: Emergency Medicine

## 2013-03-28 ENCOUNTER — Emergency Department (HOSPITAL_COMMUNITY)
Admission: EM | Admit: 2013-03-28 | Discharge: 2013-03-28 | Disposition: A | Discharge status: Home / Self Care | Payer: Medicare Other | Attending: Emergency Medicine | Admitting: Emergency Medicine

## 2013-03-28 DIAGNOSIS — M25551 Pain in right hip: Secondary | ICD-10-CM

## 2013-03-28 DIAGNOSIS — Y9301 Activity, walking, marching and hiking: Secondary | ICD-10-CM | POA: Insufficient documentation

## 2013-03-28 DIAGNOSIS — Z9889 Other specified postprocedural states: Secondary | ICD-10-CM | POA: Insufficient documentation

## 2013-03-28 DIAGNOSIS — G8929 Other chronic pain: Secondary | ICD-10-CM

## 2013-03-28 DIAGNOSIS — S79929A Unspecified injury of unspecified thigh, initial encounter: Secondary | ICD-10-CM | POA: Insufficient documentation

## 2013-03-28 DIAGNOSIS — F411 Generalized anxiety disorder: Secondary | ICD-10-CM | POA: Insufficient documentation

## 2013-03-28 DIAGNOSIS — Z87448 Personal history of other diseases of urinary system: Secondary | ICD-10-CM | POA: Insufficient documentation

## 2013-03-28 DIAGNOSIS — S0990XA Unspecified injury of head, initial encounter: Secondary | ICD-10-CM | POA: Insufficient documentation

## 2013-03-28 DIAGNOSIS — N390 Urinary tract infection, site not specified: Secondary | ICD-10-CM | POA: Insufficient documentation

## 2013-03-28 DIAGNOSIS — I10 Essential (primary) hypertension: Secondary | ICD-10-CM | POA: Insufficient documentation

## 2013-03-28 DIAGNOSIS — Z79899 Other long term (current) drug therapy: Secondary | ICD-10-CM | POA: Insufficient documentation

## 2013-03-28 DIAGNOSIS — R001 Bradycardia, unspecified: Secondary | ICD-10-CM

## 2013-03-28 DIAGNOSIS — S79919A Unspecified injury of unspecified hip, initial encounter: Secondary | ICD-10-CM | POA: Insufficient documentation

## 2013-03-28 DIAGNOSIS — Z8673 Personal history of transient ischemic attack (TIA), and cerebral infarction without residual deficits: Secondary | ICD-10-CM | POA: Insufficient documentation

## 2013-03-28 DIAGNOSIS — W010XXA Fall on same level from slipping, tripping and stumbling without subsequent striking against object, initial encounter: Secondary | ICD-10-CM | POA: Insufficient documentation

## 2013-03-28 DIAGNOSIS — M810 Age-related osteoporosis without current pathological fracture: Secondary | ICD-10-CM | POA: Insufficient documentation

## 2013-03-28 DIAGNOSIS — S46909A Unspecified injury of unspecified muscle, fascia and tendon at shoulder and upper arm level, unspecified arm, initial encounter: Secondary | ICD-10-CM | POA: Insufficient documentation

## 2013-03-28 DIAGNOSIS — Z880 Allergy status to penicillin: Secondary | ICD-10-CM | POA: Insufficient documentation

## 2013-03-28 DIAGNOSIS — Z872 Personal history of diseases of the skin and subcutaneous tissue: Secondary | ICD-10-CM | POA: Insufficient documentation

## 2013-03-28 DIAGNOSIS — E119 Type 2 diabetes mellitus without complications: Secondary | ICD-10-CM | POA: Insufficient documentation

## 2013-03-28 DIAGNOSIS — Y92009 Unspecified place in unspecified non-institutional (private) residence as the place of occurrence of the external cause: Secondary | ICD-10-CM | POA: Insufficient documentation

## 2013-03-28 DIAGNOSIS — I498 Other specified cardiac arrhythmias: Secondary | ICD-10-CM | POA: Insufficient documentation

## 2013-03-28 DIAGNOSIS — Z8719 Personal history of other diseases of the digestive system: Secondary | ICD-10-CM | POA: Insufficient documentation

## 2013-03-28 DIAGNOSIS — S4980XA Other specified injuries of shoulder and upper arm, unspecified arm, initial encounter: Secondary | ICD-10-CM | POA: Insufficient documentation

## 2013-03-28 LAB — BASIC METABOLIC PANEL
BUN: 35 mg/dL — ABNORMAL HIGH (ref 6–23)
GFR calc Af Amer: 31 mL/min — ABNORMAL LOW (ref >90)
GFR calc non Af Amer: 27 mL/min — ABNORMAL LOW (ref >90)
Potassium: 4.9 mEq/L (ref 3.5–5.1)
Sodium: 138 mEq/L (ref 135–145)

## 2013-03-28 LAB — URINALYSIS, ROUTINE W REFLEX MICROSCOPIC
Hgb urine dipstick: NEGATIVE
Nitrite: NEGATIVE
Specific Gravity, Urine: 1.018 (ref 1.005–1.030)
Urobilinogen, UA: 0.2 mg/dL (ref 0.0–1.0)

## 2013-03-28 LAB — CBC WITH DIFFERENTIAL/PLATELET
Basophils Relative: 0 % (ref 0–1)
Hemoglobin: 11.2 g/dL — ABNORMAL LOW (ref 12.0–15.0)
MCHC: 33.6 g/dL (ref 30.0–36.0)
Monocytes Relative: 6 % (ref 3–12)
Neutro Abs: 8.3 10*3/uL — ABNORMAL HIGH (ref 1.7–7.7)
Neutrophils Relative %: 79 % — ABNORMAL HIGH (ref 43–77)
RBC: 3.82 MIL/uL — ABNORMAL LOW (ref 3.87–5.11)

## 2013-03-28 LAB — URINE MICROSCOPIC-ADD ON

## 2013-03-28 MED ORDER — FENTANYL CITRATE 0.05 MG/ML IJ SOLN
25.0000 ug | Freq: Once | INTRAMUSCULAR | Status: AC
  Administered 2013-03-28: 25 ug via INTRAVENOUS
  Filled 2013-03-28: qty 2

## 2013-03-28 MED ORDER — CIPROFLOXACIN HCL 500 MG PO TABS: 250.0000 mg | ORAL_TABLET | Freq: Two times a day (BID) | ORAL | Status: DC

## 2013-03-28 NOTE — ED Notes (Signed)
Pt from home c/o fall. Per EMS pt fell while bending over to clean. Pt denies LOC, has right shoulder pain. Has received 200 fentanyl enroute. Pt is alert at this time

## 2013-03-28 NOTE — ED Provider Notes (Addendum)
History     CSN: [id number]  Arrival date & time 03/28/13  1357   First MD Initiated Contact with Patient 03/28/13 1406      Chief Complaint  Patient presents with  . Fall    (Consider location/radiation/quality/duration/timing/severity/associated sxs/prior treatment) HPI  Pt is a 79 yo F PMHx significant for HTN and renal insufficiency presenting to the emergency department after a fall today. Patient states she was walking outside to sweep the porch when she caught her foot on the rug and tripped and landed on her right shoulder and hip.  Pt states she was unable to get up from the ground without help from a family member. She denies hitting her head, LOC, CP, SOB, dizziness, HA. No recent changes in medications or doses. Patient denies fevers, chills, nausea, vomiting, or diarrhea.    Past Medical History  Diagnosis Date  . Allergy     allergic rhinitis  . Diabetes mellitus     type II  . Hypertension   . Osteopenia   . TIA (transient ischemic attack)   . Contact dermatitis     on buttocks  . Nosebleed   . Anxiety   . Renal insufficiency   . Pyelonephritis     Past Surgical History  Procedure Laterality Date  . Rotator cuff repair  06/2007    right  . Abdominal hysterectomy    . Back surgery  03/1999    disk  . Breast surgery      breast biopsy x2 negative  . Shoulder surgery  2007    left  . Joint replacement      Rt knee patella replacement  . Colonoscopy  08/28/2012    Procedure: COLONOSCOPY;  Surgeon: Theda Belfast, MD;  Location: [facility] ENDOSCOPY;  Service: Endoscopy;  Laterality: N/A;    Family History  Problem Relation Age of Onset  . Stroke Mother   . Stroke Father     History  Substance Use Topics  . Smoking status: Never Smoker   . Smokeless tobacco: Not on file  . Alcohol Use: No    OB History   Grav Para Term Preterm Abortions TAB SAB Ect Mult Living                  Review of Systems  Constitutional: Negative for fever and chills.   HENT: Negative for neck pain and neck stiffness.   Eyes: Negative for visual disturbance.  Respiratory: Negative for cough, chest tightness and shortness of breath.   Cardiovascular: Negative for chest pain, palpitations and leg swelling.  Gastrointestinal: Negative for nausea, vomiting, abdominal pain, diarrhea, constipation and abdominal distention.  Musculoskeletal: Positive for myalgias and back pain.  Skin: Negative.   Neurological: Negative for dizziness, syncope, facial asymmetry, speech difficulty, weakness, light-headedness, numbness and headaches.  Psychiatric/Behavioral: Negative for confusion.    Allergies  Amoxicillin; Atorvastatin; Cephalexin; Cetirizine hcl; Codeine; Cortisone; Ezetimibe-simvastatin; Fentanyl; Guaifenesin; Ketorolac tromethamine; Lisinopril; Metformin; Nsaids; Penicillins; Rofecoxib; Rosuvastatin; Simvastatin; and Spironolactone  Home Medications   Current Outpatient Rx  Name  Route  Sig  Dispense  Refill  . acetaminophen (TYLENOL ARTHRITIS PAIN) 650 MG CR tablet   Oral   Take 650 mg by mouth every 8 (eight) hours as needed. for pain         . alendronate (FOSAMAX) 70 MG tablet   Oral   Take 70 mg by mouth every 7 (seven) days. Saturday. Take with a full glass of water on an empty stomach.         Marland Kitchen  Calcium Carbonate-Vitamin D 600-400 MG-UNIT per tablet   Oral   Take 1 tablet by mouth daily.           . Cinnamon 500 MG capsule   Oral   Take 1,000 mg by mouth daily.          Marland Kitchen escitalopram (LEXAPRO) 10 MG tablet   Oral   Take 10 mg by mouth daily.         . fenofibrate micronized (LOFIBRA) 134 MG capsule   Oral   Take 134 mg by mouth daily before breakfast.         . Lysine 500 MG TABS   Oral   Take 1 tablet by mouth daily.           . metoprolol tartrate (LOPRESSOR) 25 MG tablet   Oral   Take 25 mg by mouth daily.         . Omega-3 Fatty Acids (FISH OIL PO)   Oral   Take 1 capsule by mouth 2 (two) times daily.             Marland Kitchen oxyCODONE-acetaminophen (PERCOCET) 10-325 MG per tablet   Oral   Take 1 tablet by mouth 3 (three) times daily as needed for pain.         Marland Kitchen solifenacin (VESICARE) 5 MG tablet   Oral   Take 5 mg by mouth daily.         . ciprofloxacin (CIPRO) 500 MG tablet   Oral   Take 0.5 tablets (250 mg total) by mouth 2 (two) times daily.   3 tablet   0   . glucose blood test strip      Ck glucose three times daily and as needed.            BP 128/72  Pulse 54  Temp(Src) 97.6 F (36.4 C) (Oral)  Resp 19  SpO2 98%  Physical Exam  Constitutional: She is oriented to person, place, and time. She appears well-developed and well-nourished.  HENT:  Head: Normocephalic and atraumatic.  Mouth/Throat: Oropharynx is clear and moist.  Eyes: EOM are normal. Pupils are equal, round, and reactive to light.  Neck: Neck supple.  Cardiovascular: Normal rate, regular rhythm, normal heart sounds and intact distal pulses.   Pulmonary/Chest: Effort normal and breath sounds normal. No respiratory distress. She exhibits no tenderness.  Abdominal: Soft. Bowel sounds are normal. There is no tenderness.  Musculoskeletal: She exhibits no edema.       Right shoulder: She exhibits decreased range of motion and tenderness. She exhibits no bony tenderness, no swelling and no deformity.       Right hip: She exhibits tenderness. She exhibits normal range of motion, normal strength, no bony tenderness, no swelling and no deformity.       Legs: Neurological: She is alert and oriented to person, place, and time. No cranial nerve deficit.  Skin: Skin is warm and dry.  Psychiatric: She has a normal mood and affect.    ED Course  Procedures (including critical care time)   Date: 03/28/2013  Rate: 47  Rhythm: sinus bradycardia  QRS Axis: normal  Intervals: normal  ST/T Wave abnormalities: normal  Conduction Disutrbances:none  Narrative Interpretation:   Old EKG Reviewed: none available   Date:  03/28/2013  Rate: 57  Rhythm: normal sinus rhythm  QRS Axis: normal  Intervals: normal  ST/T Wave abnormalities: normal  Conduction Disutrbances:none  Narrative Interpretation:   Old EKG Reviewed: no longer bradycardic  Labs Reviewed  BASIC METABOLIC PANEL - Abnormal; Notable for the following:    Glucose, Bld 147 (*)    BUN 35 (*)    Creatinine, Ser 1.71 (*)    GFR calc non Af Amer 27 (*)    GFR calc Af Amer 31 (*)    All other components within normal limits  CBC WITH DIFFERENTIAL - Abnormal; Notable for the following:    RBC 3.82 (*)    Hemoglobin 11.2 (*)    HCT 33.3 (*)    Neutrophils Relative % 79 (*)    Neutro Abs 8.3 (*)    All other components within normal limits  URINALYSIS, ROUTINE W REFLEX MICROSCOPIC - Abnormal; Notable for the following:    APPearance HAZY (*)    Leukocytes, UA MODERATE (*)    All other components within normal limits  URINE MICROSCOPIC-ADD ON - Abnormal; Notable for the following:    Bacteria, UA FEW (*)    All other components within normal limits  URINE CULTURE  TROPONIN I   Dg Shoulder Right  03/28/2013   *RADIOLOGY REPORT*  Clinical Data: Recent traumatic injury with pain  RIGHT SHOULDER - 2+ VIEW  Comparison: 02/15/2009  Findings: A reversed hemiarthroplasty is again identified and stable.  No acute fracture is seen.  Calcific changes are noted beneath the arthroplasty which have increased somewhat in the interval from prior exam.  Resorption of the distal clavicle is again seen.  IMPRESSION: Chronic changes without definitive acute abnormality.   Original Report Authenticated By: Alcide Clever, M.D.   Dg Hip Complete Right  03/28/2013   *RADIOLOGY REPORT*  Clinical Data: Recent injury with pain  RIGHT HIP - COMPLETE 2+ VIEW  Comparison: 03/30/2011  Findings: Degenerative changes of the hip joints are noted bilaterally.  Similar changes are noted within the lumbar spine. Pelvic ring is intact.  No acute fracture or dislocation is noted.  No soft tissue abnormality is seen.  IMPRESSION: Chronic changes without acute abnormality.   Original Report Authenticated By: Alcide Clever, M.D.   Ct Head Wo Contrast  03/28/2013   *RADIOLOGY REPORT*  Clinical Data:  Fall.  CT HEAD WITHOUT CONTRAST CT CERVICAL SPINE WITHOUT CONTRAST  Technique:  Multidetector CT imaging of the head and cervical spine was performed following the standard protocol without intravenous contrast.  Multiplanar CT image reconstructions of the cervical spine were also generated.  Comparison:  CT head cervical spine 12/12/2011  CT HEAD  Findings: Negative for hemorrhage, hydrocephalus, mass effect, mass lesion, or evidence of acute cortically based infarction.  Mild chronic periventricular white matter disease adjacent to the frontal horn of the right lateral ventricle is stable.  The skull is intact.  The visualized paranasal sinuses and mastoid air cells are clear.  Status post bilateral lens extraction.  Orbital soft tissues show no acute findings.  Soft tissues of the scalp are symmetric.  The scout view shows postoperative changes of right shoulder arthroplasty.  IMPRESSION:  1.  No acute intracranial abnormality.  CT CERVICAL SPINE  Findings: Cervical spine is aligned from the skull base through the T1 vertebral body.  There are stable postoperative changes of anterior cervical discectomy and fusion at C5-C6.  The bony fusion appears complete.  There is degenerative disc disease at C6-7 stable.  Bilateral facet joint hypertrophy is present at C3-C4, with associated moderate to severe right neural foraminal narrowing and mild left neural foraminal narrowing. There is a stable central disc bulge at C4-C5.  No acute cervical  spine fracture is identified.  15 mm nodule in the upper pole of the right thyroid lobe is stable.  This nodule is partially exophytic.  Coronary artery atherosclerotic calcification is again noted.  The prevertebral soft tissue contour is normal.  IMPRESSION:  1.   No evidence of acute bony injury to the cervical spine.  2.  Stable postoperative changes of anterior cervical discectomy and fusion at C5-C6. 3. Degenerative disc disease at C6-C7. 4. Central disc bulge at C4-5 appears similar to prior exam. 5.  15 mm right thyroid lobe nodule.  Consider further evaluation with outpatient thyroid ultrasound if not previously performed (no thyroid ultrasound is seen in [facility].   Original Report Authenticated By: Britta Mccreedy, M.D.   Ct Cervical Spine Wo Contrast  03/28/2013   *RADIOLOGY REPORT*  Clinical Data:  Fall.  CT HEAD WITHOUT CONTRAST CT CERVICAL SPINE WITHOUT CONTRAST  Technique:  Multidetector CT imaging of the head and cervical spine was performed following the standard protocol without intravenous contrast.  Multiplanar CT image reconstructions of the cervical spine were also generated.  Comparison:  CT head cervical spine 12/12/2011  CT HEAD  Findings: Negative for hemorrhage, hydrocephalus, mass effect, mass lesion, or evidence of acute cortically based infarction.  Mild chronic periventricular white matter disease adjacent to the frontal horn of the right lateral ventricle is stable.  The skull is intact.  The visualized paranasal sinuses and mastoid air cells are clear.  Status post bilateral lens extraction.  Orbital soft tissues show no acute findings.  Soft tissues of the scalp are symmetric.  The scout view shows postoperative changes of right shoulder arthroplasty.  IMPRESSION:  1.  No acute intracranial abnormality.  CT CERVICAL SPINE  Findings: Cervical spine is aligned from the skull base through the T1 vertebral body.  There are stable postoperative changes of anterior cervical discectomy and fusion at C5-C6.  The bony fusion appears complete.  There is degenerative disc disease at C6-7 stable.  Bilateral facet joint hypertrophy is present at C3-C4, with associated moderate to severe right neural foraminal narrowing and mild left neural foraminal  narrowing. There is a stable central disc bulge at C4-C5.  No acute cervical spine fracture is identified.  15 mm nodule in the upper pole of the right thyroid lobe is stable.  This nodule is partially exophytic.  Coronary artery atherosclerotic calcification is again noted.  The prevertebral soft tissue contour is normal.  IMPRESSION:  1.  No evidence of acute bony injury to the cervical spine.  2.  Stable postoperative changes of anterior cervical discectomy and fusion at C5-C6. 3. Degenerative disc disease at C6-C7. 4. Central disc bulge at C4-5 appears similar to prior exam. 5.  15 mm right thyroid lobe nodule.  Consider further evaluation with outpatient thyroid ultrasound if not previously performed (no thyroid ultrasound is seen in [facility].   Original Report Authenticated By: Britta Mccreedy, M.D.   C spine cleared.  Pt able to ambulate in ED.  1. Fall at home, initial encounter   2. Chronic shoulder pain, right   3. Hip pain, acute, right   4. Sinus bradycardia by electrocardiogram   5. UTI (urinary tract infection)       MDM  Pt presenting after a fall onto her right hip and shoulder. No other complaints aside from right shoulder and hip pain. No neurofocal deficits. C-spine cleared. Pt asymptomatically bradycardiac initially on arrival, bradycardia improved during course in ED. Labs reviewed. Pt afebrile. No ischemic  changes on EKG. CT scan of head and neck w/o acute findings. No acute findings on x-ray of shoulder and hip. Pt provided sling for right shoulder. Able to ambulate in ED. Pt advised to cut her Metoprolol dose in half until seen by her Cardiologist this week to discuss bradycardia. Pt advised to follow up with her orthopedist regarding shoulder pain. Pt d/c w/ Abx for UTI. Pt advised to take home pain medications for shoulder and hip pain. Patient agreeable to plan. Patient d/w with Dr. Rhunette Croft, agrees with plan. Patient is stable at time of discharge    Gilmer Mor 03/30/13 1610  Medical screening examination/treatment/procedure(s) were conducted as a shared visit with non-physician practitioner(s) and myself.  I personally evaluated the patient during the encounter.  Fall - no fractures seen. Will ambulate. Basic screening labs and imaging ordered.  Derwood Kaplan, MD 03/30/13 1042  Elderly with a fall. Appropriate imaging ordered to screen for fractures, brain bleed/ subdural hematoma.   Derwood Kaplan, MD 05/01/13 [phone]

## 2013-03-29 LAB — URINE CULTURE: Culture: NO GROWTH

## 2013-04-27 ENCOUNTER — Ambulatory Visit
Admission: RE | Admit: 2013-04-27 | Discharge: 2013-04-27 | Disposition: A | Discharge status: Home / Self Care | Payer: Medicare Other | Source: Ambulatory Visit | Attending: Family Medicine | Admitting: Family Medicine

## 2013-04-27 ENCOUNTER — Other Ambulatory Visit: Payer: Self-pay | Admitting: Family Medicine

## 2013-04-27 DIAGNOSIS — M25512 Pain in left shoulder: Secondary | ICD-10-CM

## 2013-06-11 ENCOUNTER — Emergency Department (HOSPITAL_COMMUNITY): Payer: Medicare Other

## 2013-06-11 ENCOUNTER — Inpatient Hospital Stay (HOSPITAL_COMMUNITY)
Admission: EM | Admit: 2013-06-11 | Discharge: 2013-06-13 | DRG: 312 | Disposition: A | Discharge status: Home / Self Care | Payer: Medicare Other | Attending: Internal Medicine | Admitting: Internal Medicine

## 2013-06-11 ENCOUNTER — Encounter (HOSPITAL_COMMUNITY): Payer: Self-pay | Admitting: *Deleted

## 2013-06-11 DIAGNOSIS — F0390 Unspecified dementia without behavioral disturbance: Secondary | ICD-10-CM | POA: Insufficient documentation

## 2013-06-11 DIAGNOSIS — I872 Venous insufficiency (chronic) (peripheral): Secondary | ICD-10-CM

## 2013-06-11 DIAGNOSIS — M479 Spondylosis, unspecified: Secondary | ICD-10-CM

## 2013-06-11 DIAGNOSIS — D631 Anemia in chronic kidney disease: Secondary | ICD-10-CM | POA: Diagnosis present

## 2013-06-11 DIAGNOSIS — E119 Type 2 diabetes mellitus without complications: Secondary | ICD-10-CM

## 2013-06-11 DIAGNOSIS — F430 Acute stress reaction: Secondary | ICD-10-CM

## 2013-06-11 DIAGNOSIS — E7800 Pure hypercholesterolemia, unspecified: Secondary | ICD-10-CM

## 2013-06-11 DIAGNOSIS — E875 Hyperkalemia: Secondary | ICD-10-CM

## 2013-06-11 DIAGNOSIS — E042 Nontoxic multinodular goiter: Secondary | ICD-10-CM

## 2013-06-11 DIAGNOSIS — H9202 Otalgia, left ear: Secondary | ICD-10-CM

## 2013-06-11 DIAGNOSIS — R51 Headache: Secondary | ICD-10-CM

## 2013-06-11 DIAGNOSIS — W19XXXA Unspecified fall, initial encounter: Secondary | ICD-10-CM

## 2013-06-11 DIAGNOSIS — R609 Edema, unspecified: Secondary | ICD-10-CM

## 2013-06-11 DIAGNOSIS — N289 Disorder of kidney and ureter, unspecified: Secondary | ICD-10-CM

## 2013-06-11 DIAGNOSIS — K921 Melena: Secondary | ICD-10-CM

## 2013-06-11 DIAGNOSIS — I951 Orthostatic hypotension: Principal | ICD-10-CM

## 2013-06-11 DIAGNOSIS — Z8673 Personal history of transient ischemic attack (TIA), and cerebral infarction without residual deficits: Secondary | ICD-10-CM

## 2013-06-11 DIAGNOSIS — R001 Bradycardia, unspecified: Secondary | ICD-10-CM | POA: Diagnosis present

## 2013-06-11 DIAGNOSIS — M5030 Other cervical disc degeneration, unspecified cervical region: Secondary | ICD-10-CM

## 2013-06-11 DIAGNOSIS — Z9181 History of falling: Secondary | ICD-10-CM

## 2013-06-11 DIAGNOSIS — E669 Obesity, unspecified: Secondary | ICD-10-CM

## 2013-06-11 DIAGNOSIS — I10 Essential (primary) hypertension: Secondary | ICD-10-CM | POA: Diagnosis present

## 2013-06-11 DIAGNOSIS — N390 Urinary tract infection, site not specified: Secondary | ICD-10-CM

## 2013-06-11 DIAGNOSIS — I119 Hypertensive heart disease without heart failure: Secondary | ICD-10-CM

## 2013-06-11 DIAGNOSIS — F411 Generalized anxiety disorder: Secondary | ICD-10-CM | POA: Diagnosis present

## 2013-06-11 DIAGNOSIS — N182 Chronic kidney disease, stage 2 (mild): Secondary | ICD-10-CM | POA: Diagnosis present

## 2013-06-11 DIAGNOSIS — N179 Acute kidney failure, unspecified: Secondary | ICD-10-CM

## 2013-06-11 DIAGNOSIS — N039 Chronic nephritic syndrome with unspecified morphologic changes: Secondary | ICD-10-CM | POA: Diagnosis present

## 2013-06-11 DIAGNOSIS — D649 Anemia, unspecified: Secondary | ICD-10-CM

## 2013-06-11 DIAGNOSIS — J309 Allergic rhinitis, unspecified: Secondary | ICD-10-CM

## 2013-06-11 DIAGNOSIS — F419 Anxiety disorder, unspecified: Secondary | ICD-10-CM

## 2013-06-11 DIAGNOSIS — N189 Chronic kidney disease, unspecified: Secondary | ICD-10-CM | POA: Diagnosis present

## 2013-06-11 DIAGNOSIS — Z8679 Personal history of other diseases of the circulatory system: Secondary | ICD-10-CM

## 2013-06-11 DIAGNOSIS — T887XXA Unspecified adverse effect of drug or medicament, initial encounter: Secondary | ICD-10-CM

## 2013-06-11 DIAGNOSIS — M899 Disorder of bone, unspecified: Secondary | ICD-10-CM

## 2013-06-11 DIAGNOSIS — W57XXXA Bitten or stung by nonvenomous insect and other nonvenomous arthropods, initial encounter: Secondary | ICD-10-CM

## 2013-06-11 DIAGNOSIS — Z96659 Presence of unspecified artificial knee joint: Secondary | ICD-10-CM

## 2013-06-11 DIAGNOSIS — I129 Hypertensive chronic kidney disease with stage 1 through stage 4 chronic kidney disease, or unspecified chronic kidney disease: Secondary | ICD-10-CM | POA: Diagnosis present

## 2013-06-11 DIAGNOSIS — Y92009 Unspecified place in unspecified non-institutional (private) residence as the place of occurrence of the external cause: Secondary | ICD-10-CM

## 2013-06-11 DIAGNOSIS — N259 Disorder resulting from impaired renal tubular function, unspecified: Secondary | ICD-10-CM

## 2013-06-11 DIAGNOSIS — I498 Other specified cardiac arrhythmias: Secondary | ICD-10-CM | POA: Diagnosis present

## 2013-06-11 DIAGNOSIS — R55 Syncope and collapse: Secondary | ICD-10-CM

## 2013-06-11 DIAGNOSIS — M25551 Pain in right hip: Secondary | ICD-10-CM

## 2013-06-11 HISTORY — DX: Unspecified dementia, unspecified severity, without behavioral disturbance, psychotic disturbance, mood disturbance, and anxiety: F03.90

## 2013-06-11 LAB — CBC WITH DIFFERENTIAL/PLATELET
Basophils Absolute: 0 10*3/uL (ref 0.0–0.1)
HCT: 31.4 % — ABNORMAL LOW (ref 36.0–46.0)
Hemoglobin: 10.5 g/dL — ABNORMAL LOW (ref 12.0–15.0)
Lymphocytes Relative: 15 % (ref 12–46)
Lymphs Abs: 1.4 10*3/uL (ref 0.7–4.0)
Monocytes Absolute: 0.7 10*3/uL (ref 0.1–1.0)
Monocytes Relative: 8 % (ref 3–12)
Neutro Abs: 6.4 10*3/uL (ref 1.7–7.7)
RBC: 3.57 MIL/uL — ABNORMAL LOW (ref 3.87–5.11)
RDW: 15.5 % (ref 11.5–15.5)
WBC: 8.9 10*3/uL (ref 4.0–10.5)

## 2013-06-11 LAB — COMPREHENSIVE METABOLIC PANEL
ALT: 19 U/L (ref 0–35)
AST: 25 U/L (ref 0–37)
CO2: 21 mEq/L (ref 19–32)
Chloride: 105 mEq/L (ref 96–112)
Creatinine, Ser: 2.02 mg/dL — ABNORMAL HIGH (ref 0.50–1.10)
GFR calc non Af Amer: 22 mL/min — ABNORMAL LOW (ref >90)
Total Bilirubin: 0.4 mg/dL (ref 0.3–1.2)

## 2013-06-11 LAB — TROPONIN I: Troponin I: <0.3 ng/mL (ref <0.30)

## 2013-06-11 LAB — URINE MICROSCOPIC-ADD ON

## 2013-06-11 LAB — URINALYSIS, ROUTINE W REFLEX MICROSCOPIC
Bilirubin Urine: NEGATIVE
Glucose, UA: NEGATIVE mg/dL
Ketones, ur: NEGATIVE mg/dL
pH: 5.5 (ref 5.0–8.0)

## 2013-06-11 MED ORDER — ENOXAPARIN SODIUM 30 MG/0.3ML ~~LOC~~ SOLN
30.0000 mg | SUBCUTANEOUS | Status: DC
  Administered 2013-06-12 – 2013-06-13 (×2): 30 mg via SUBCUTANEOUS
  Filled 2013-06-11 (×2): qty 0.3

## 2013-06-11 MED ORDER — OXYCODONE-ACETAMINOPHEN 5-325 MG PO TABS
1.0000 | ORAL_TABLET | Freq: Every day | ORAL | Status: DC | PRN
  Administered 2013-06-12 – 2013-06-13 (×2): 1 via ORAL
  Filled 2013-06-11 (×2): qty 1

## 2013-06-11 MED ORDER — CIPROFLOXACIN IN D5W 400 MG/200ML IV SOLN
400.0000 mg | Freq: Two times a day (BID) | INTRAVENOUS | Status: DC
  Filled 2013-06-11: qty 200

## 2013-06-11 MED ORDER — DONEPEZIL HCL 10 MG PO TABS
10.0000 mg | ORAL_TABLET | Freq: Every day | ORAL | Status: DC
  Administered 2013-06-11 – 2013-06-12 (×2): 10 mg via ORAL
  Filled 2013-06-11 (×3): qty 1

## 2013-06-11 MED ORDER — ACETAMINOPHEN 650 MG RE SUPP: 650.0000 mg | Freq: Four times a day (QID) | RECTAL | Status: DC | PRN

## 2013-06-11 MED ORDER — TRIAMCINOLONE ACETONIDE 0.1 % EX CREA
TOPICAL_CREAM | Freq: Two times a day (BID) | CUTANEOUS | Status: DC
  Administered 2013-06-11 – 2013-06-13 (×4): via TOPICAL
  Filled 2013-06-11: qty 15

## 2013-06-11 MED ORDER — INSULIN ASPART 100 UNIT/ML ~~LOC~~ SOLN: 0.0000 [IU] | Freq: Three times a day (TID) | SUBCUTANEOUS | Status: DC

## 2013-06-11 MED ORDER — CALCIUM CARB-CHOLECALCIFEROL 600-800 MG-UNIT PO TABS: 1.0000 | ORAL_TABLET | Freq: Every day | ORAL | Status: DC

## 2013-06-11 MED ORDER — LOSARTAN POTASSIUM 50 MG PO TABS
50.0000 mg | ORAL_TABLET | Freq: Every day | ORAL | Status: DC
  Filled 2013-06-11: qty 1

## 2013-06-11 MED ORDER — ASPIRIN 81 MG PO CHEW
81.0000 mg | CHEWABLE_TABLET | Freq: Every day | ORAL | Status: DC
  Administered 2013-06-12 – 2013-06-13 (×2): 81 mg via ORAL
  Filled 2013-06-11 (×2): qty 1

## 2013-06-11 MED ORDER — OXYCODONE HCL 5 MG PO TABS: 5.0000 mg | ORAL_TABLET | Freq: Every day | ORAL | Status: DC | PRN

## 2013-06-11 MED ORDER — OXYCODONE-ACETAMINOPHEN 10-325 MG PO TABS: 1.0000 | ORAL_TABLET | Freq: Every day | ORAL | Status: DC | PRN

## 2013-06-11 MED ORDER — TRIAMCINOLONE ACETONIDE 0.1 % EX LOTN
1.0000 "application " | TOPICAL_LOTION | Freq: Two times a day (BID) | CUTANEOUS | Status: DC
  Filled 2013-06-11: qty 60

## 2013-06-11 MED ORDER — SODIUM CHLORIDE 0.9 % IJ SOLN: 3.0000 mL | Freq: Two times a day (BID) | INTRAMUSCULAR | Status: DC

## 2013-06-11 MED ORDER — SODIUM CHLORIDE 0.9 % IV BOLUS (SEPSIS)
1000.0000 mL | Freq: Once | INTRAVENOUS | Status: AC
  Administered 2013-06-11: 1000 mL via INTRAVENOUS

## 2013-06-11 MED ORDER — ONDANSETRON HCL 4 MG PO TABS: 4.0000 mg | ORAL_TABLET | Freq: Four times a day (QID) | ORAL | Status: DC | PRN

## 2013-06-11 MED ORDER — ESCITALOPRAM OXALATE 20 MG PO TABS
20.0000 mg | ORAL_TABLET | Freq: Every day | ORAL | Status: DC
  Administered 2013-06-12 – 2013-06-13 (×2): 20 mg via ORAL
  Filled 2013-06-11 (×2): qty 1

## 2013-06-11 MED ORDER — ACETAMINOPHEN 325 MG PO TABS
650.0000 mg | ORAL_TABLET | Freq: Four times a day (QID) | ORAL | Status: DC | PRN
  Administered 2013-06-11 – 2013-06-12 (×3): 650 mg via ORAL
  Filled 2013-06-11 (×2): qty 2

## 2013-06-11 MED ORDER — SODIUM CHLORIDE 0.9 % IV SOLN
INTRAVENOUS | Status: AC
  Administered 2013-06-11: 23:00:00 via INTRAVENOUS

## 2013-06-11 MED ORDER — CIPROFLOXACIN IN D5W 400 MG/200ML IV SOLN: 400.0000 mg | INTRAVENOUS | Status: DC

## 2013-06-11 MED ORDER — ONDANSETRON HCL 4 MG/2ML IJ SOLN: 4.0000 mg | Freq: Four times a day (QID) | INTRAMUSCULAR | Status: DC | PRN

## 2013-06-11 MED ORDER — CALCIUM CARBONATE-VITAMIN D 500-200 MG-UNIT PO TABS
1.0000 | ORAL_TABLET | Freq: Every day | ORAL | Status: DC
  Administered 2013-06-12 – 2013-06-13 (×2): 1 via ORAL
  Filled 2013-06-11 (×2): qty 1

## 2013-06-11 MED ORDER — INSULIN ASPART 100 UNIT/ML ~~LOC~~ SOLN: 0.0000 [IU] | Freq: Every day | SUBCUTANEOUS | Status: DC

## 2013-06-11 MED ORDER — ADULT MULTIVITAMIN W/MINERALS CH
1.0000 | ORAL_TABLET | Freq: Every day | ORAL | Status: DC
  Administered 2013-06-12 – 2013-06-13 (×2): 1 via ORAL
  Filled 2013-06-11 (×2): qty 1

## 2013-06-11 MED ORDER — CIPROFLOXACIN IN D5W 400 MG/200ML IV SOLN
400.0000 mg | INTRAVENOUS | Status: DC
  Administered 2013-06-11 – 2013-06-12 (×2): 400 mg via INTRAVENOUS
  Filled 2013-06-11 (×3): qty 200

## 2013-06-11 MED ORDER — FENOFIBRATE 160 MG PO TABS
160.0000 mg | ORAL_TABLET | Freq: Every day | ORAL | Status: DC
  Administered 2013-06-12 – 2013-06-13 (×2): 160 mg via ORAL
  Filled 2013-06-11 (×2): qty 1

## 2013-06-11 NOTE — ED Notes (Signed)
Pt states she is unable to urinate at this time. RN informed family to instruct nurse when patient has the urge to go.

## 2013-06-11 NOTE — H&P (Addendum)
Patient's PCP: Ailene Ravel, MD  Chief Complaint: Fall  History of Present Illness: Linda [name] is a 79 y.o. Caucasian female with history of diabetes, hypertension, TIA, chronic kidney disease stage II, and mild dementia who presents with the above complaints.  Patient provided some of the history, daughter at bedside provided most of the history.  Patient since March of this year has been having multiple falls.  She has had another fall in May of 2014, one week ago Saturday, and today as a result she presented to the emergency department for further evaluation.  During each of these episodes of falls, patient cannot remember what happened.  With her fall today, patient cannot recall if she had any chest pain, dizziness, or any prodromal symptoms.  She is also uncertain if she lost consciousness.  Patient's daughter who just entered the home noted that patient was on the floor.  It is also uncertain how long she was on the floor.  In the emergency department patient was found to be orthostatic, acute renal failure, and with a urinary tract infection.  Hospitalist service was asked to admit the patient for further care and management.  Patient denies any chest pain, recent fevers, chills, nausea , vomiting, shortness of breath, abdominal pain, diarrhea, headaches or changes.  Of note the patient was found to be bradycardic with heart rate in the 40s.  Review of Systems: All systems reviewed with the patient and positive as per history of present illness, otherwise all other systems are negative.  Past Medical History  Diagnosis Date  . Allergy     allergic rhinitis  . Diabetes mellitus     type II  . Hypertension   . Osteopenia   . TIA (transient ischemic attack)   . Contact dermatitis     on buttocks  . Nosebleed   . Anxiety   . Renal insufficiency   . Pyelonephritis   . Dementia    Past Surgical History  Procedure Laterality Date  . Rotator cuff repair  06/2007    right  .  Abdominal hysterectomy    . Back surgery  03/1999    disk  . Breast surgery      breast biopsy x2 negative  . Shoulder surgery  2007    left  . Joint replacement      Rt knee patella replacement  . Colonoscopy  08/28/2012    Procedure: COLONOSCOPY;  Surgeon: Theda Belfast, MD;  Location: [facility] ENDOSCOPY;  Service: Endoscopy;  Laterality: N/A;   Family History  Problem Relation Age of Onset  . Stroke Mother   . Stroke Father    History   Social History  . Marital Status: Single    Spouse Name: N/A    Number of Children: N/A  . Years of Education: N/A   Occupational History  . Not on file.   Social History Main Topics  . Smoking status: Never Smoker   . Smokeless tobacco: Not on file  . Alcohol Use: No  . Drug Use: No  . Sexually Active: Not on file   Other Topics Concern  . Not on file   Social History Narrative  . No narrative on file   Allergies: Amoxicillin; Atorvastatin; Cephalexin; Cetirizine hcl; Cortisone; Ezetimibe-simvastatin; Fentanyl; Guaifenesin; Ketorolac tromethamine; Lisinopril; Metformin; Nsaids; Penicillins; Rofecoxib; Rosuvastatin; Simvastatin; Spironolactone; Sulfamethoxazole-tmp ds; and Codeine  Home Meds: Prior to Admission medications   Medication Sig Start Date End Date Taking? Authorizing Provider  alendronate (FOSAMAX) 70 MG tablet  Take 70 mg by mouth every 7 (seven) days. Saturday. Take with a full glass of water on an empty stomach.   Yes Historical Provider, MD  azithromycin (ZITHROMAX) 250 MG tablet Take 250-500 mg by mouth daily. Take two tablets 06/08/13 and one tablet daily for 4 more days 06/08/13 06/12/13 Yes Historical Provider, MD  Calcium Carb-Cholecalciferol (CALTRATE 600+D) 600-800 MG-UNIT TABS Take 1 tablet by mouth daily.    Yes Historical Provider, MD  donepezil (ARICEPT) 10 MG tablet Take 10 mg by mouth at bedtime.   Yes Historical Provider, MD  escitalopram (LEXAPRO) 20 MG tablet Take 20 mg by mouth daily. For nerves   Yes  Historical Provider, MD  fenofibrate micronized (LOFIBRA) 134 MG capsule Take 134 mg by mouth daily before breakfast.   Yes Historical Provider, MD  furosemide (LASIX) 20 MG tablet Take 20 mg by mouth daily.   Yes Historical Provider, MD  losartan (COZAAR) 50 MG tablet Take 50 mg by mouth daily.   Yes Historical Provider, MD  metoprolol tartrate (LOPRESSOR) 25 MG tablet Take 25 mg by mouth daily. For nerves   Yes Historical Provider, MD  Multiple Vitamin (MULTIVITAMIN WITH MINERALS) TABS Take 1 tablet by mouth daily.   Yes Historical Provider, MD  oxyCODONE-acetaminophen (PERCOCET) 10-325 MG per tablet Take 1 tablet by mouth daily as needed for pain.    Yes Historical Provider, MD  triamcinolone lotion (KENALOG) 0.1 % Apply 1 application topically 2 (two) times daily. Apply to rash on legs until cleared   Yes Historical Provider, MD  glucose blood test strip Ck glucose three times daily and as needed.     Historical Provider, MD    Physical Exam: Blood pressure 143/53, pulse 41, temperature 97.7 F (36.5 C), temperature source Oral, resp. rate 20, height 5\' 4"  (1.626 m), weight 105.688 kg (233 lb), SpO2 99.00%. General: Awake, Oriented x3, No acute distress. HEENT: EOMI, Moist mucous membranes, bruising on the right side of her face and cheek from the fall. Neck: Supple CV: S1 and S2, bradycardic Lungs: Clear to ascultation bilaterally Abdomen: Soft, Nontender, Nondistended, +bowel sounds. Ext: Good pulses. Trace edema. No clubbing or cyanosis noted. Neuro: Cranial Nerves II-XII grossly intact. Has 5/5 motor strength in upper and lower extremities.  Lab results:  Recent Labs  06/11/13 1854  NA 137  K 5.4*  CL 105  CO2 21  GLUCOSE 116*  BUN 46*  CREATININE 2.02*  CALCIUM 9.3    Recent Labs  06/11/13 1854  AST 25  ALT 19  ALKPHOS 40  BILITOT 0.4  PROT 6.2  ALBUMIN 3.6   No results found for this basename: LIPASE, AMYLASE,  in the last 72 hours  Recent Labs   06/11/13 1854  WBC 8.9  NEUTROABS 6.4  HGB 10.5*  HCT 31.4*  MCV 88.0  PLT 255    Recent Labs  06/11/13 1854  TROPONINI <0.30   No components found with this basename: POCBNP,  No results found for this basename: DDIMER,  in the last 72 hours No results found for this basename: HGBA1C,  in the last 72 hours No results found for this basename: CHOL, HDL, LDLCALC, TRIG, CHOLHDL, LDLDIRECT,  in the last 72 hours No results found for this basename: TSH, T4TOTAL, FREET3, T3FREE, THYROIDAB,  in the last 72 hours No results found for this basename: VITAMINB12, FOLATE, FERRITIN, TIBC, IRON, RETICCTPCT,  in the last 72 hours Imaging results:  Dg Chest 2 View  06/11/2013   *RADIOLOGY REPORT*  Clinical Data: Hypertension  CHEST - 2 VIEW  Comparison: 12/12/2011  Findings: The heart and pulmonary vascularity are stable. Postsurgical changes are again noted in the right shoulder.  No focal infiltrate is seen.  Degenerative changes of the thoracic spine are noted.  IMPRESSION: No acute abnormalities seen.   Original Report Authenticated By: Alcide Clever, M.D.   Ct Head Wo Contrast  06/11/2013   *RADIOLOGY REPORT*  Clinical Data:  Dizziness with recent falls  CT HEAD WITHOUT CONTRAST CT CERVICAL SPINE WITHOUT CONTRAST  Technique:  Multidetector CT imaging of the head and cervical spine was performed following the standard protocol without intravenous contrast.  Multiplanar CT image reconstructions of the cervical spine were also generated.  Comparison:  03/28/2013  CT HEAD  Findings: The bony calvarium is intact.  No gross soft tissue abnormality is seen.  Ventricles are mildly prominent consistent with mild cortical atrophy.  No findings to suggest acute hemorrhage, acute infarction or space-occupying mass lesion are noted.  IMPRESSION: Chronic changes without acute abnormality.  CT CERVICAL SPINE  Findings: Seven cervical segments are well visualized.  Prior fusion is noted at C5-6.  Anterior osteophytes  are noted at C4-5 and C6-7. No acute fracture is identified.  No acute facet abnormality is seen.  The surrounding soft tissues show carotid calcifications.  The visualized portions of the lung apices are within normal limits.  Facet hypertrophic changes are seen.  IMPRESSION: Degenerative change and postsurgical change.  The overall appearance is stable from prior exam.  No acute abnormality is noted.   Original Report Authenticated By: Alcide Clever, M.D.   Ct Cervical Spine Wo Contrast  06/11/2013   *RADIOLOGY REPORT*  Clinical Data:  Dizziness with recent falls  CT HEAD WITHOUT CONTRAST CT CERVICAL SPINE WITHOUT CONTRAST  Technique:  Multidetector CT imaging of the head and cervical spine was performed following the standard protocol without intravenous contrast.  Multiplanar CT image reconstructions of the cervical spine were also generated.  Comparison:  03/28/2013  CT HEAD  Findings: The bony calvarium is intact.  No gross soft tissue abnormality is seen.  Ventricles are mildly prominent consistent with mild cortical atrophy.  No findings to suggest acute hemorrhage, acute infarction or space-occupying mass lesion are noted.  IMPRESSION: Chronic changes without acute abnormality.  CT CERVICAL SPINE  Findings: Seven cervical segments are well visualized.  Prior fusion is noted at C5-6.  Anterior osteophytes are noted at C4-5 and C6-7. No acute fracture is identified.  No acute facet abnormality is seen.  The surrounding soft tissues show carotid calcifications.  The visualized portions of the lung apices are within normal limits.  Facet hypertrophic changes are seen.  IMPRESSION: Degenerative change and postsurgical change.  The overall appearance is stable from prior exam.  No acute abnormality is noted.   Original Report Authenticated By: Alcide Clever, M.D.   Other results: EKG: Sinus bradycardia with heart rate of 45 which is unchanged from EKG in May of 2014.  Assessment & Plan by Problem: Fall  unclear if patient had a syncopal event Admit the patient to telemetry, cycle cardiac enzymes rule the patient out for acute coronary syndrome.  Head CT and chest x-ray shows no acute abnormality, chronic changes.  Last 2-D echocardiogram on 04/16/1998 showed normal systolic function with EF of 67%, will give another echocardiogram for further evaluation.  Start aspirin 81 mg daily, multiple allergies noted.  Bradycardia Patient is on metoprolol.  Uncertain if patient's bradycardia could be contributing to a fall  with possible syncopal event.  Hold metoprolol.  If heart rate does not improve off of metoprolol, may consider cardiology evaluation.  2-D echocardiogram ordered as indicated above.  Orthostatic hypotension Patient is on furosemide which has been held.  Will gently hydrate the patient on normal saline.  Reassess orthostatic vitals in the morning. (Vitals in the emergency department, weighing 173/72 pulse of 46, sitting 143/60 with pulse of 54, standing 150/49 pulse 47)  Type 2 diabetes Sliding scale insulin.  Diabetic diet.  Allergy to metformin.  Acute renal failure on chronic kidney disease stage II Baseline creatinine on October 2013 was 1.1.  Again furosemide has been held.  Will reassess renal function after hydration.  His renal function does not improve with hydration consider further workup.  Urinary tract infection Agents multiple allergies noted.  Start ciprofloxacin, titrate antibiotics depending on urine culture results and sensitivities.  Anemia Likely due to chronic kidney disease.  Check anemia panel in the morning.  Hypertension Continue home antihypertensive medications except metoprolol.  Mild Hyperkalemia Monitor for now. No EKG changes noted.  Obesity Diet and exercise as outpatient.  Prophylaxis Lovenox.  CODE STATUS Full code.  This was discussed with the patient the time of admission.  Disposition Admit the patient to telemetry as  observation.  Time spent on admission, talking to the patient, and coordinating care was: 50 mins.  Jissell Trafton A, MD 06/11/2013, 10:15 PM

## 2013-06-11 NOTE — ED Notes (Signed)
Family reports fell last week & had fallen again today. Found today on floor by daughter with a hematoma to right side face. Pt c/o dizziness x 2days & h/a. Pt poor historian due to dementia. ?LOC

## 2013-06-11 NOTE — ED Provider Notes (Signed)
CSN: [id number]     Arrival date & time 06/11/13  1814 History     First MD Initiated Contact with Patient 06/11/13 1819     Chief Complaint  Patient presents with  . Fall  . Head Injury  . Dizziness   (Consider location/radiation/quality/duration/timing/severity/associated sxs/prior Treatment) Patient is a 79 y.o. female presenting with syncope. The history is provided by the patient and the EMS personnel.  Loss of Consciousness Episode history:  Single Most recent episode:  Today Timing:  Sporadic Progression:  Resolved Chronicity:  Recurrent Relieved by:  Lying down Worsened by:  Nothing tried Ineffective treatments:  None tried Associated symptoms: dizziness   Associated symptoms: no chest pain, no diaphoresis, no fever, no headaches, no nausea, no palpitations, no seizures, no shortness of breath and no vomiting     Past Medical History  Diagnosis Date  . Allergy     allergic rhinitis  . Diabetes mellitus     type II  . Hypertension   . Osteopenia   . TIA (transient ischemic attack)   . Contact dermatitis     on buttocks  . Nosebleed   . Anxiety   . Renal insufficiency   . Pyelonephritis   . Dementia    Past Surgical History  Procedure Laterality Date  . Rotator cuff repair  06/2007    right  . Abdominal hysterectomy    . Back surgery  03/1999    disk  . Breast surgery      breast biopsy x2 negative  . Shoulder surgery  2007    left  . Joint replacement      Rt knee patella replacement  . Colonoscopy  08/28/2012    Procedure: COLONOSCOPY;  Surgeon: Theda Belfast, MD;  Location: [facility] ENDOSCOPY;  Service: Endoscopy;  Laterality: N/A;   Family History  Problem Relation Age of Onset  . Stroke Mother   . Stroke Father    History  Substance Use Topics  . Smoking status: Never Smoker   . Smokeless tobacco: Not on file  . Alcohol Use: No   OB History   Grav Para Term Preterm Abortions TAB SAB Ect Mult Living                 Review of Systems    Constitutional: Negative for fever, chills, diaphoresis and fatigue.  HENT: Positive for facial swelling. Negative for ear pain, congestion, sore throat, mouth sores, trouble swallowing, neck pain and neck stiffness.   Eyes: Negative.   Respiratory: Negative for apnea, cough, chest tightness, shortness of breath and wheezing.   Cardiovascular: Positive for syncope. Negative for chest pain, palpitations and leg swelling.  Gastrointestinal: Negative for nausea, vomiting, abdominal pain, diarrhea and abdominal distention.  Genitourinary: Negative for hematuria, flank pain, vaginal discharge, difficulty urinating and menstrual problem.  Musculoskeletal: Negative for back pain and gait problem.  Skin: Negative for rash and wound.  Neurological: Positive for dizziness and syncope. Negative for tremors, seizures, facial asymmetry, numbness and headaches.  Psychiatric/Behavioral: Negative.   All other systems reviewed and are negative.    Allergies  Amoxicillin; Atorvastatin; Cephalexin; Cetirizine hcl; Cortisone; Ezetimibe-simvastatin; Fentanyl; Guaifenesin; Ketorolac tromethamine; Lisinopril; Metformin; Nsaids; Penicillins; Rofecoxib; Rosuvastatin; Simvastatin; Spironolactone; Sulfamethoxazole-tmp ds; and Codeine  Home Medications   Current Outpatient Rx  Name  Route  Sig  Dispense  Refill  . alendronate (FOSAMAX) 70 MG tablet   Oral   Take 70 mg by mouth every 7 (seven) days. Saturday. Take with a full glass  of water on an empty stomach.         Marland Kitchen azithromycin (ZITHROMAX) 250 MG tablet   Oral   Take 250-500 mg by mouth daily. Take two tablets 06/08/13 and one tablet daily for 4 more days         . Calcium Carb-Cholecalciferol (CALTRATE 600+D) 600-800 MG-UNIT TABS   Oral   Take 1 tablet by mouth daily.          Marland Kitchen donepezil (ARICEPT) 10 MG tablet   Oral   Take 10 mg by mouth at bedtime.         Marland Kitchen escitalopram (LEXAPRO) 20 MG tablet   Oral   Take 20 mg by mouth daily. For  nerves         . fenofibrate micronized (LOFIBRA) 134 MG capsule   Oral   Take 134 mg by mouth daily before breakfast.         . furosemide (LASIX) 20 MG tablet   Oral   Take 20 mg by mouth daily.         Marland Kitchen losartan (COZAAR) 50 MG tablet   Oral   Take 50 mg by mouth daily.         . metoprolol tartrate (LOPRESSOR) 25 MG tablet   Oral   Take 25 mg by mouth daily. For nerves         . Multiple Vitamin (MULTIVITAMIN WITH MINERALS) TABS   Oral   Take 1 tablet by mouth daily.         Marland Kitchen oxyCODONE-acetaminophen (PERCOCET) 10-325 MG per tablet   Oral   Take 1 tablet by mouth daily as needed for pain.          Marland Kitchen triamcinolone lotion (KENALOG) 0.1 %   Topical   Apply 1 application topically 2 (two) times daily. Apply to rash on legs until cleared         . glucose blood test strip      Ck glucose three times daily and as needed.           BP 129/48  Pulse 51  Temp(Src) 97.7 F (36.5 C) (Oral)  Resp 14  Ht 5\' 4"  (1.626 m)  Wt 233 lb (105.688 kg)  BMI 39.97 kg/m2  SpO2 98% Physical Exam  Nursing note and vitals reviewed. Constitutional: She appears well-developed and well-nourished. No distress.  HENT:  Head: Normocephalic. Head is with abrasion and with contusion.    Right Ear: External ear normal.  Left Ear: External ear normal.  Nose: Nose normal.  Mouth/Throat: Oropharynx is clear and moist. No oropharyngeal exudate.  Eyes: Conjunctivae and EOM are normal. Pupils are equal, round, and reactive to light. Right eye exhibits no discharge. Left eye exhibits no discharge.  Neck: Normal range of motion. Neck supple. No JVD present. No spinous process tenderness present. No tracheal deviation present. No thyromegaly present.  Cardiovascular: Regular rhythm and intact distal pulses.  Bradycardia present.  Exam reveals no gallop and no friction rub.   Murmur heard.  Systolic murmur is present with a grade of 3/6  Pulmonary/Chest: Effort normal and breath  sounds normal. No respiratory distress. She has no wheezes. She has no rales. She exhibits no tenderness.  Abdominal: Soft. Bowel sounds are normal. She exhibits no distension. There is no tenderness. There is no rebound and no guarding.  Musculoskeletal: Normal range of motion.  Patient with scars from previous surgeries seen on her right shoulder and bilateral knees. No new pain  with palpation or ROM  Lymphadenopathy:    She has no cervical adenopathy.  Neurological: She is alert. She has normal strength. No cranial nerve deficit. She exhibits normal muscle tone. Coordination normal. GCS eye subscore is 4. GCS verbal subscore is 5. GCS motor subscore is 6.  Patient with normal cranial nerve exam and motor and sensory exam  Skin: Skin is warm. No rash noted. She is not diaphoretic.  Psychiatric: She has a normal mood and affect. Her behavior is normal. Judgment and thought content normal.    ED Course   Procedures (including critical care time)  Labs Reviewed  CBC WITH DIFFERENTIAL - Abnormal; Notable for the following:    RBC 3.57 (*)    Hemoglobin 10.5 (*)    HCT 31.4 (*)    All other components within normal limits  COMPREHENSIVE METABOLIC PANEL - Abnormal; Notable for the following:    Potassium 5.4 (*)    Glucose, Bld 116 (*)    BUN 46 (*)    Creatinine, Ser 2.02 (*)    GFR calc non Af Amer 22 (*)    GFR calc Af Amer 25 (*)    All other components within normal limits  LACTIC ACID, PLASMA  TROPONIN I  URINALYSIS, ROUTINE W REFLEX MICROSCOPIC   Dg Chest 2 View  06/11/2013   *RADIOLOGY REPORT*  Clinical Data: Hypertension  CHEST - 2 VIEW  Comparison: 12/12/2011  Findings: The heart and pulmonary vascularity are stable. Postsurgical changes are again noted in the right shoulder.  No focal infiltrate is seen.  Degenerative changes of the thoracic spine are noted.  IMPRESSION: No acute abnormalities seen.   Original Report Authenticated By: Alcide Clever, M.D.   Ct Head Wo  Contrast  06/11/2013   *RADIOLOGY REPORT*  Clinical Data:  Dizziness with recent falls  CT HEAD WITHOUT CONTRAST CT CERVICAL SPINE WITHOUT CONTRAST  Technique:  Multidetector CT imaging of the head and cervical spine was performed following the standard protocol without intravenous contrast.  Multiplanar CT image reconstructions of the cervical spine were also generated.  Comparison:  03/28/2013  CT HEAD  Findings: The bony calvarium is intact.  No gross soft tissue abnormality is seen.  Ventricles are mildly prominent consistent with mild cortical atrophy.  No findings to suggest acute hemorrhage, acute infarction or space-occupying mass lesion are noted.  IMPRESSION: Chronic changes without acute abnormality.  CT CERVICAL SPINE  Findings: Seven cervical segments are well visualized.  Prior fusion is noted at C5-6.  Anterior osteophytes are noted at C4-5 and C6-7. No acute fracture is identified.  No acute facet abnormality is seen.  The surrounding soft tissues show carotid calcifications.  The visualized portions of the lung apices are within normal limits.  Facet hypertrophic changes are seen.  IMPRESSION: Degenerative change and postsurgical change.  The overall appearance is stable from prior exam.  No acute abnormality is noted.   Original Report Authenticated By: Alcide Clever, M.D.   Ct Cervical Spine Wo Contrast  06/11/2013   *RADIOLOGY REPORT*  Clinical Data:  Dizziness with recent falls  CT HEAD WITHOUT CONTRAST CT CERVICAL SPINE WITHOUT CONTRAST  Technique:  Multidetector CT imaging of the head and cervical spine was performed following the standard protocol without intravenous contrast.  Multiplanar CT image reconstructions of the cervical spine were also generated.  Comparison:  03/28/2013  CT HEAD  Findings: The bony calvarium is intact.  No gross soft tissue abnormality is seen.  Ventricles are mildly prominent consistent with  mild cortical atrophy.  No findings to suggest acute hemorrhage, acute  infarction or space-occupying mass lesion are noted.  IMPRESSION: Chronic changes without acute abnormality.  CT CERVICAL SPINE  Findings: Seven cervical segments are well visualized.  Prior fusion is noted at C5-6.  Anterior osteophytes are noted at C4-5 and C6-7. No acute fracture is identified.  No acute facet abnormality is seen.  The surrounding soft tissues show carotid calcifications.  The visualized portions of the lung apices are within normal limits.  Facet hypertrophic changes are seen.  IMPRESSION: Degenerative change and postsurgical change.  The overall appearance is stable from prior exam.  No acute abnormality is noted.   Original Report Authenticated By: Alcide Clever, M.D.   1. Syncope   2. Hyperkalemia   3. Renal insufficiency   4. Bradycardia     MDM  79 yr old female patient here after syncope. Reportedly patient feeling dizzy for the past couple of days. Daughter found her after she fell at home. Patient does have dementia and diminished memory, but at this time has no symptoms. She does not complain of HA, face pain, vision changes, neck pain, abdominal pain or extremity pain on my interview. Patient found to be bradycardic, and has positive orthostatics. Will give fluids. Also patient with increasing potassium and worsening kidney function, could be a sign of dehydration. There does not seem to be any source of infection. Will admit to the hospital given the renal insufficiency, hyperkalemia, and high risk syncope.  Case discussed with Dr. Sharlot Gowda, MD 06/11/13 [phone]

## 2013-06-11 NOTE — ED Notes (Signed)
Pt currently in Radiology at this time.  

## 2013-06-11 NOTE — ED Notes (Signed)
Admitting Physician at bedside.

## 2013-06-11 NOTE — ED Notes (Addendum)
Pt cannot recall the events that brought her here to the ED. Unaware of falling today. Hematoma & edema noted to right side face. Pt also c/o right shoulder & low back pain. Pt daughter reports pt currently receiving PT for right shoulder surgery & has had low back pain x 1 week from previous fall. No deformity noted to shoulder. Daughter states pt many times forgets to use her cane when ambulating. Unknown LOC, pt was conscious when found by daughter. Pt c/o dizziness with mvmt x 2 days

## 2013-06-12 DIAGNOSIS — I059 Rheumatic mitral valve disease, unspecified: Secondary | ICD-10-CM

## 2013-06-12 DIAGNOSIS — F411 Generalized anxiety disorder: Secondary | ICD-10-CM

## 2013-06-12 LAB — CBC
Hemoglobin: 9.2 g/dL — ABNORMAL LOW (ref 12.0–15.0)
MCH: 29 pg (ref 26.0–34.0)
MCV: 88.3 fL (ref 78.0–100.0)
RBC: 3.17 MIL/uL — ABNORMAL LOW (ref 3.87–5.11)

## 2013-06-12 LAB — GLUCOSE, CAPILLARY
Glucose-Capillary: 92 mg/dL (ref 70–99)
Glucose-Capillary: 96 mg/dL (ref 70–99)

## 2013-06-12 LAB — BASIC METABOLIC PANEL
CO2: 22 mEq/L (ref 19–32)
Calcium: 8.7 mg/dL (ref 8.4–10.5)
Glucose, Bld: 95 mg/dL (ref 70–99)
Potassium: 4.9 mEq/L (ref 3.5–5.1)
Sodium: 137 mEq/L (ref 135–145)

## 2013-06-12 LAB — IRON AND TIBC
Saturation Ratios: 18 % — ABNORMAL LOW (ref 20–55)
TIBC: 344 ug/dL (ref 250–470)

## 2013-06-12 LAB — RETICULOCYTES: Retic Ct Pct: 1.1 % (ref 0.4–3.1)

## 2013-06-12 LAB — TROPONIN I: Troponin I: <0.3 ng/mL (ref <0.30)

## 2013-06-12 LAB — FERRITIN: Ferritin: 70 ng/mL (ref 10–291)

## 2013-06-12 NOTE — Progress Notes (Signed)
Patient ID: Linda [name]  female  ZOX:[id number]    DOB: [birth year]    DOA: 06/11/2013  PCP: Ailene Ravel, MD  Assessment/Plan:  Fall vs syncopal event  - Cardiac enzymes negative,  -  Head CT and chest x-ray shows no acute abnormality, chronic changes.  - 2-D echo pending  - On aspirin - Per daughter, patient has 2 other falls when she had no syncopal event prior to this admission, and PT evaluation ordered   Bradycardia  -  hold beta blocker, 2-D echo pending   Orthostatic hypotension  - Continue to hold Lasix, gentle hydration   Type 2 diabetes  Sliding scale insulin. Diabetic diet. Allergy to metformin.   Acute renal failure on chronic kidney disease stage II Baseline creatinine on October 2013 was 1.1. - Hold Lasix, losartan, BMET in a.m.    Urinary tract infection - per daughter she has recurrent UTIs - Continue ciprofloxacin, titrate antibiotics depending on urine culture results and sensitivities.   Anemia  Likely due to chronic kidney disease. Check anemia panel in the morning.   Hypertension  - BP somewhat soft, hold antihypertensives  Mild Hyperkalemia - resolved  Obesity  Diet and exercise as outpatient.   Prophylaxis  Lovenox.  Code Status: Full CODE STATUS  Disposition:Hopefully tomorrow    Subjective: Feels better today, denies any chest pain, shortness of breath or nausea, vomiting.   Objective: Weight change:   Intake/Output Summary (Last 24 hours) at 06/12/13 1118 Last data filed at 06/12/13 0900  Gross per 24 hour  Intake    120 ml  Output      0 ml  Net    120 ml   Blood pressure 129/64, pulse 55, temperature 97.9 F (36.6 C), temperature source Oral, resp. rate 18, height 5\' 5"  (1.651 m), weight 105.008 kg (231 lb 8 oz), SpO2 93.00%.  Physical Exam: General: Alert and awake, oriented x3, NAD. CVS: S1-S2 clear, no murmur rubs or gallops Chest: clear to auscultation bilaterally, no wheezing, rales or rhonchi Abdomen: soft  nontender, nondistended, normal bowel sounds  Extremities: no cyanosis, clubbing or edema noted bilaterally Neuro: Cranial nerves II-XII intact, no focal neurological deficits  Lab Results: Basic Metabolic Panel:  Recent Labs Lab 06/11/13 1854 06/12/13 0731  NA 137 137  K 5.4* 4.9  CL 105 107  CO2 21 22  GLUCOSE 116* 95  BUN 46* 40*  CREATININE 2.02* 1.94*  CALCIUM 9.3 8.7   Liver Function Tests:  Recent Labs Lab 06/11/13 1854  AST 25  ALT 19  ALKPHOS 40  BILITOT 0.4  PROT 6.2  ALBUMIN 3.6   No results found for this basename: LIPASE, AMYLASE,  in the last 168 hours No results found for this basename: AMMONIA,  in the last 168 hours CBC:  Recent Labs Lab 06/11/13 1854 06/12/13 0731  WBC 8.9 5.9  NEUTROABS 6.4  --   HGB 10.5* 9.2*  HCT 31.4* 28.0*  MCV 88.0 88.3  PLT 255 220   Cardiac Enzymes:  Recent Labs Lab 06/11/13 1854 06/12/13 0130 06/12/13 0731  TROPONINI <0.30 <0.30 <0.30   BNP: No components found with this basename: POCBNP,  CBG:  Recent Labs Lab 06/12/13 0741  GLUCAP 96     Micro Results: No results found for this or any previous visit (from the past 240 hour(s)).  Studies/Results: Dg Chest 2 View  06/11/2013   *RADIOLOGY REPORT*  Clinical Data: Hypertension  CHEST - 2 VIEW  Comparison: 12/12/2011  Findings: The  heart and pulmonary vascularity are stable. Postsurgical changes are again noted in the right shoulder.  No focal infiltrate is seen.  Degenerative changes of the thoracic spine are noted.  IMPRESSION: No acute abnormalities seen.   Original Report Authenticated By: Alcide Clever, M.D.   Ct Head Wo Contrast  06/11/2013   *RADIOLOGY REPORT*  Clinical Data:  Dizziness with recent falls  CT HEAD WITHOUT CONTRAST CT CERVICAL SPINE WITHOUT CONTRAST  Technique:  Multidetector CT imaging of the head and cervical spine was performed following the standard protocol without intravenous contrast.  Multiplanar CT image reconstructions of  the cervical spine were also generated.  Comparison:  03/28/2013  CT HEAD  Findings: The bony calvarium is intact.  No gross soft tissue abnormality is seen.  Ventricles are mildly prominent consistent with mild cortical atrophy.  No findings to suggest acute hemorrhage, acute infarction or space-occupying mass lesion are noted.  IMPRESSION: Chronic changes without acute abnormality.  CT CERVICAL SPINE  Findings: Seven cervical segments are well visualized.  Prior fusion is noted at C5-6.  Anterior osteophytes are noted at C4-5 and C6-7. No acute fracture is identified.  No acute facet abnormality is seen.  The surrounding soft tissues show carotid calcifications.  The visualized portions of the lung apices are within normal limits.  Facet hypertrophic changes are seen.  IMPRESSION: Degenerative change and postsurgical change.  The overall appearance is stable from prior exam.  No acute abnormality is noted.   Original Report Authenticated By: Alcide Clever, M.D.   Ct Cervical Spine Wo Contrast  06/11/2013   *RADIOLOGY REPORT*  Clinical Data:  Dizziness with recent falls  CT HEAD WITHOUT CONTRAST CT CERVICAL SPINE WITHOUT CONTRAST  Technique:  Multidetector CT imaging of the head and cervical spine was performed following the standard protocol without intravenous contrast.  Multiplanar CT image reconstructions of the cervical spine were also generated.  Comparison:  03/28/2013  CT HEAD  Findings: The bony calvarium is intact.  No gross soft tissue abnormality is seen.  Ventricles are mildly prominent consistent with mild cortical atrophy.  No findings to suggest acute hemorrhage, acute infarction or space-occupying mass lesion are noted.  IMPRESSION: Chronic changes without acute abnormality.  CT CERVICAL SPINE  Findings: Seven cervical segments are well visualized.  Prior fusion is noted at C5-6.  Anterior osteophytes are noted at C4-5 and C6-7. No acute fracture is identified.  No acute facet abnormality is seen.   The surrounding soft tissues show carotid calcifications.  The visualized portions of the lung apices are within normal limits.  Facet hypertrophic changes are seen.  IMPRESSION: Degenerative change and postsurgical change.  The overall appearance is stable from prior exam.  No acute abnormality is noted.   Original Report Authenticated By: Alcide Clever, M.D.    Medications: Scheduled Meds: . aspirin  81 mg Oral Daily  . calcium-vitamin D  1 tablet Oral Daily  . ciprofloxacin  400 mg Intravenous Q24H  . donepezil  10 mg Oral QHS  . enoxaparin (LOVENOX) injection  30 mg Subcutaneous Q24H  . escitalopram  20 mg Oral Daily  . fenofibrate  160 mg Oral Daily  . insulin aspart  0-5 Units Subcutaneous QHS  . insulin aspart  0-9 Units Subcutaneous TID WC  . multivitamin with minerals  1 tablet Oral Daily  . sodium chloride  3 mL Intravenous Q12H  . triamcinolone cream   Topical BID      LOS: 1 day   RAI,RIPUDEEP M.D. [facility]  06/12/2013, 11:18 AM Pager: [phone]  If 7PM-7AM, please contact night-coverage [email or web address] Password [email or web address]

## 2013-06-12 NOTE — Evaluation (Signed)
Physical Therapy Evaluation Patient Details Name: Linda [name] MRN: [id number] DOB: 05-15-1930 Today's Date: 06/12/2013 Time: 6213-0865 PT Time Calculation (min): 21 min  PT Assessment / Plan / Recommendation History of Present Illness  Linda [name] is a 79 y.o. Caucasian female with history of diabetes, hypertension, TIA, chronic kidney disease stage II, and mild dementia who presents with UTi.  Clinical Impression  Pt demonstrates deficits in functional mobility as indicated. Feel patient will benefit from continued skilled PT to address deficits and maximize function. Will continue to see as indicated and progress activity as tolerated. Recommend patient resume her outpatient PT services with addition of evaluation for balance program.    PT Assessment  Patient needs continued PT services    Follow Up Recommendations  Other (comment) (resume Outpatient PT, add orders for LE deficits)          Equipment Recommendations  None recommended by PT    Recommendations for Other Services OT consult (HQ:IONGE shoulder)   Frequency Min 2X/week    Precautions / Restrictions Precautions Precautions: Fall   Pertinent Vitals/Pain 4/10 pain in right flank      Mobility  Bed Mobility Bed Mobility: Supine to Sit;Sitting - Scoot to [place] of Bed;Sit to Supine Supine to Sit: 4: Min assist Sitting - Scoot to [place] of Bed: 4: Min assist Sit to Supine: 3: Mod assist Details for Bed Mobility Assistance: Assist to elevate and rotate trunk, VCs for positioning, Mod assist back to supine secondary to right flank pain (side she fell on) Transfers Transfers: Sit to Stand;Stand to Sit Sit to Stand: 4: Min guard Stand to Sit: 4: Min guard Details for Transfer Assistance: No physical assist needed Ambulation/Gait Ambulation/Gait Assistance: 4: Min guard Ambulation Distance (Feet): 140 Feet Assistive device: Rolling walker Ambulation/Gait Assistance Details: some instability and VCs for proper  position within rw, No noted LOB Gait Pattern: Within Functional Limits Gait velocity: decreased General Gait Details: minor instability noted        PT Diagnosis: Generalized weakness  PT Problem List: Decreased strength;Decreased activity tolerance;Decreased balance;Decreased mobility;Decreased knowledge of use of DME PT Treatment Interventions: DME instruction;Stair training;Functional mobility training;Therapeutic activities;Therapeutic exercise;Balance training     PT Goals(Current goals can be found in the care plan section) Acute Rehab PT Goals Patient Stated Goal: to go home PT Goal Formulation: With patient Time For Goal Achievement: 06/26/13 Potential to Achieve Goals: Good  Visit Information  Last PT Received On: 06/12/13 Assistance Needed: +1 History of Present Illness: Linda [name] is a 79 y.o. Caucasian female with history of diabetes, hypertension, TIA, chronic kidney disease stage II, and mild dementia who presents with UTi.       Prior Functioning  Home Living Family/patient expects to be discharged to:: Private residence Living Arrangements: Children Available Help at Discharge: Family;Available 24 hours/day Type of Home: House Home Access: Ramped entrance Home Layout: Able to live on main level with bedroom/bathroom Home Equipment: Walker - 2 wheels;Cane - single point;Bedside commode Prior Function Level of Independence: Independent with assistive device(s) Comments: cane, history of falls recently Communication Communication: HOH Dominant Hand: Right    Cognition  Cognition Arousal/Alertness: Awake/alert Behavior During Therapy: WFL for tasks assessed/performed Overall Cognitive Status: History of cognitive impairments - at baseline    Extremity/Trunk Assessment Upper Extremity Assessment Upper Extremity Assessment: Defer to OT evaluation;Generalized weakness;RUE deficits/detail Lower Extremity Assessment Lower Extremity Assessment:  Generalized weakness   Balance Balance Balance Assessed: Yes Static Sitting Balance Static Sitting - Balance Support:  Feet supported Static Sitting - Level of Assistance: 7: Independent Static Sitting - Comment/# of Minutes: 4 minutes High Level Balance High Level Balance Activites: Side stepping;Backward walking;Direction changes;Turns High Level Balance Comments: Some instability noted, min Guard and VCs for position within rw  End of Session PT - End of Session Equipment Utilized During Treatment: Gait belt Activity Tolerance: Patient limited by fatigue Patient left: in bed;with call bell/phone within reach;with family/visitor present Nurse Communication: Mobility status  GP Functional Assessment Tool Used: clinical judgement Functional Limitation: Mobility: Walking and moving around Mobility: Walking and Moving Around Current Status (W1191): At least 20 percent but less than 40 percent impaired, limited or restricted Mobility: Walking and Moving Around Goal Status [phone]): At least 1 percent but less than 20 percent impaired, limited or restricted   Fabio Asa 06/12/2013, 12:33 PM  Charlotte Crumb, PT DPT  [phone]

## 2013-06-12 NOTE — Progress Notes (Signed)
  Echocardiogram 2D Echocardiogram has been performed.  Linda [name] 06/12/2013, 12:26 PM

## 2013-06-13 DIAGNOSIS — I498 Other specified cardiac arrhythmias: Secondary | ICD-10-CM

## 2013-06-13 DIAGNOSIS — N179 Acute kidney failure, unspecified: Secondary | ICD-10-CM

## 2013-06-13 DIAGNOSIS — F0390 Unspecified dementia without behavioral disturbance: Secondary | ICD-10-CM

## 2013-06-13 DIAGNOSIS — E119 Type 2 diabetes mellitus without complications: Secondary | ICD-10-CM

## 2013-06-13 DIAGNOSIS — D649 Anemia, unspecified: Secondary | ICD-10-CM

## 2013-06-13 LAB — URINE CULTURE
Colony Count: NO GROWTH
Culture: NO GROWTH

## 2013-06-13 LAB — BASIC METABOLIC PANEL
BUN: 29 mg/dL — ABNORMAL HIGH (ref 6–23)
Creatinine, Ser: 1.64 mg/dL — ABNORMAL HIGH (ref 0.50–1.10)
GFR calc Af Amer: 33 mL/min — ABNORMAL LOW (ref >90)
GFR calc non Af Amer: 28 mL/min — ABNORMAL LOW (ref >90)

## 2013-06-13 LAB — GLUCOSE, CAPILLARY

## 2013-06-13 MED ORDER — HYDRALAZINE HCL 25 MG PO TABS: 25.0000 mg | ORAL_TABLET | Freq: Three times a day (TID) | ORAL | Status: DC

## 2013-06-13 MED ORDER — CIPROFLOXACIN HCL 250 MG PO TABS: 250.0000 mg | ORAL_TABLET | Freq: Two times a day (BID) | ORAL | Status: DC

## 2013-06-13 MED ORDER — HYDRALAZINE HCL 25 MG PO TABS
25.0000 mg | ORAL_TABLET | Freq: Three times a day (TID) | ORAL | Status: DC
  Administered 2013-06-13: 25 mg via ORAL
  Filled 2013-06-13 (×4): qty 1

## 2013-06-13 MED ORDER — CIPROFLOXACIN HCL 250 MG PO TABS
250.0000 mg | ORAL_TABLET | Freq: Two times a day (BID) | ORAL | Status: DC
  Filled 2013-06-13 (×3): qty 1

## 2013-06-13 MED ORDER — ASPIRIN 81 MG PO CHEW: 81.0000 mg | CHEWABLE_TABLET | Freq: Every day | ORAL | Status: DC

## 2013-06-13 MED ORDER — HYDRALAZINE HCL 25 MG PO TABS: 25.0000 mg | ORAL_TABLET | Freq: Two times a day (BID) | ORAL | Status: DC

## 2013-06-13 MED ORDER — UNABLE TO FIND: Status: DC

## 2013-06-13 NOTE — Discharge Summary (Signed)
Physician Discharge Summary  Patient ID: Linda [name] MRN: [id number] DOB/AGE: [birth year] 79 y.o.  Admit date: 06/11/2013 Discharge date: 06/13/2013  Primary Care Physician:  Ailene Ravel, MD  Discharge Diagnoses:   Syncope  . Bradycardia . Orthostatic hypotension . HYPERTENSION . OBESITY . VENOUS INSUFFICIENCY, LEGS . HYPERCHOLESTEROLEMIA . DIABETES MELLITUS, TYPE II . UTI (urinary tract infection) . Acute on chronic renal failure . Anemia  Consults:  None   Recommendations for Outpatient Follow-up:  1. please note that patient's heart rate was in 40's to 50's, hence she was taken off metoprolol 2. creatinine function was 2.0 with BUN of 46 at the time of admission, baseline around 1.7 in May 2014, 1.0 in 10/13. Lasix and losartan are currently on hold. 3. I started her on hydralazine for BP control.  4. please check a BMET at the time of followup. I advised patient to hold Lasix for another 2 days, she has a history of peripheral edema.   Allergies:   Allergies  Allergen Reactions  . Amoxicillin Other (See Comments)    REACTION: aching all over  . Atorvastatin Other (See Comments)    REACTION: increased CPK  . Cephalexin Nausea Only and Other (See Comments)    Weak, Headache  . Cetirizine Hcl Other (See Comments)    REACTION: makes her head feel big  . Cortisone Other (See Comments)    REACTION: increased bp  . Ezetimibe-Simvastatin Other (See Comments)    unknown  . Fentanyl Other (See Comments)    REACTION: stinging in her skin  . Guaifenesin Other (See Comments)    unknown  . Ketorolac Tromethamine     REACTION: no work  . Lisinopril Cough    ACE related  . Metformin Other (See Comments)    epistaxis  . Nsaids Other (See Comments)    epistaxis  . Penicillins Nausea Only and Other (See Comments)    headache  . Rofecoxib Itching  . Rosuvastatin Other (See Comments)    Myalgias  . Simvastatin Other (See Comments)    unknown  . Spironolactone Nausea  Only  . Sulfamethoxazole-Tmp Ds Other (See Comments)    unknown  . Codeine Rash     Discharge Medications:   Medication List    STOP taking these medications       azithromycin 250 MG tablet  Commonly known as:  ZITHROMAX     furosemide 20 MG tablet  Commonly known as:  LASIX     losartan 50 MG tablet  Commonly known as:  COZAAR     metoprolol tartrate 25 MG tablet  Commonly known as:  LOPRESSOR      TAKE these medications       alendronate 70 MG tablet  Commonly known as:  FOSAMAX  Take 70 mg by mouth every 7 (seven) days. Saturday. Take with a full glass of water on an empty stomach.     aspirin 81 MG chewable tablet  Chew 1 tablet (81 mg total) by mouth daily. Available over the counter     CALTRATE 600+D 600-800 MG-UNIT Tabs  Generic drug:  Calcium Carb-Cholecalciferol  Take 1 tablet by mouth daily.     ciprofloxacin 250 MG tablet  Commonly known as:  CIPRO  Take 1 tablet (250 mg total) by mouth 2 (two) times daily. X 4 more days     donepezil 10 MG tablet  Commonly known as:  ARICEPT  Take 10 mg by mouth at bedtime.     escitalopram 20  MG tablet  Commonly known as:  LEXAPRO  Take 20 mg by mouth daily. For nerves     fenofibrate micronized 134 MG capsule  Commonly known as:  LOFIBRA  Take 134 mg by mouth daily before breakfast.     glucose blood test strip  Ck glucose three times daily and as needed.     hydrALAZINE 25 MG tablet  Commonly known as:  APRESOLINE  Take 1 tablet (25 mg total) by mouth 2 (two) times daily.     multivitamin with minerals Tabs tablet  Take 1 tablet by mouth daily.     oxyCODONE-acetaminophen 10-325 MG per tablet  Commonly known as:  PERCOCET  Take 1 tablet by mouth daily as needed for pain.     triamcinolone lotion 0.1 %  Commonly known as:  KENALOG  Apply 1 application topically 2 (two) times daily. Apply to rash on legs until cleared     UNABLE TO FIND  - Resume out-patient physical therapy  -   -   -  Diagnosis: fall, shoulder arthritis         Brief H and P: For complete details please refer to admission H and P, but in brief Linda [name] is a 79 y.o. Caucasian female with history of diabetes, hypertension, TIA, chronic kidney disease stage II, and mild dementia who presents with the above complaints. Patient provided some of the history, daughter at bedside provided most of the history. Patient since March of this year has been having multiple falls. She has had another fall in May of 2014, one week ago Saturday, and today as a result she presented to the emergency department for further evaluation. During each of these episodes of falls, patient cannot remember what happened. With her fall today, patient cannot recall if she had any chest pain, dizziness, or any prodromal symptoms. She is also uncertain if she lost consciousness. Patient's daughter who just entered the home noted that patient was on the floor. It is also uncertain how long she was on the floor. In the emergency department patient was found to be orthostatic, acute renal failure, and with a urinary tract infection. Hospitalist service was asked to admit the patient for further care and management. Patient denied any chest pain, recent fevers, chills, nausea , vomiting, shortness of breath, abdominal pain, diarrhea, headaches or changes. Of note the patient was found to be bradycardic with heart rate in the 40s.   Hospital Course:   Fall vs syncopal event: Unwitnessed, Patient was unable to recall the event herself. Cardiac enzymes remained negative. Head CT and chest x-ray showed no acute abnormality, chronic changes.  - 2-D echo showed EF of 60-65% with normal wall motion. Continue aspirin. Per daughter, patient has 2 other falls when she had no syncopal event prior to this admission. PT evaluation was ordered and recommended to resume outpatient physical therapy.  Bradycardia : Metoprolol is currently held, heart rate has  improved to 59 this AM.  Orthostatic hypotension - Continue to hold Lasix or another 2 days. Patient was placed on gentle hydration.  Type 2 diabetes Sliding scale insulin. Diabetic diet. Allergy to metformin.   Acute renal failure on chronic kidney disease stage II Baseline creatinine on October 2013 was 1.1 and 1.7 in May 2014. Hold Lasix, losartan, BMET today showed improved creatinine to 1.6.  Urinary tract infection - per daughter she has recurrent UTIs. Patient was placed on ciprofloxacin. Will follow on urine culture.  Anemia Likely due to  chronic kidney disease.  Hypertension patient was placed on hydralazine 25 mg BID for hypertension.  Mild Hyperkalemia - resolved     Day of Discharge BP 137/64  Pulse 59  Temp(Src) 97.8 F (36.6 C) (Oral)  Resp 18  Ht 5\' 5"  (1.651 m)  Wt 105.008 kg (231 lb 8 oz)  BMI 38.52 kg/m2  SpO2 93%  Physical Exam: General: Alert and awake oriented x3 not in any acute distress. CVS: S1-S2 clear no murmur rubs or gallops Chest: clear to auscultation bilaterally, no wheezing rales or rhonchi Abdomen: soft nontender, nondistended, normal bowel sounds Extremities: no cyanosis, clubbing. trace edema noted bilaterally    The results of significant diagnostics from this hospitalization (including imaging, microbiology, ancillary and laboratory) are listed below for reference.    LAB RESULTS: Basic Metabolic Panel:  Recent Labs Lab 06/12/13 0731 06/13/13 0740  NA 137 139  K 4.9 4.8  CL 107 107  CO2 22 22  GLUCOSE 95 111*  BUN 40* 29*  CREATININE 1.94* 1.64*  CALCIUM 8.7 9.4   Liver Function Tests:  Recent Labs Lab 06/11/13 1854  AST 25  ALT 19  ALKPHOS 40  BILITOT 0.4  PROT 6.2  ALBUMIN 3.6   No results found for this basename: LIPASE, AMYLASE,  in the last 168 hours No results found for this basename: AMMONIA,  in the last 168 hours CBC:  Recent Labs Lab 06/11/13 1854 06/12/13 0731  WBC 8.9 5.9  NEUTROABS 6.4  --    HGB 10.5* 9.2*  HCT 31.4* 28.0*  MCV 88.0 88.3  PLT 255 220   Cardiac Enzymes:  Recent Labs Lab 06/12/13 0130 06/12/13 0731  TROPONINI <0.30 <0.30   BNP: No components found with this basename: POCBNP,  CBG:  Recent Labs Lab 06/12/13 2129 06/13/13 0709  GLUCAP 95 95    Significant Diagnostic Studies:  Dg Chest 2 View  06/11/2013   *RADIOLOGY REPORT*  Clinical Data: Hypertension  CHEST - 2 VIEW  Comparison: 12/12/2011  Findings: The heart and pulmonary vascularity are stable. Postsurgical changes are again noted in the right shoulder.  No focal infiltrate is seen.  Degenerative changes of the thoracic spine are noted.  IMPRESSION: No acute abnormalities seen.   Original Report Authenticated By: Alcide Clever, M.D.   Ct Head Wo Contrast  06/11/2013   *RADIOLOGY REPORT*  Clinical Data:  Dizziness with recent falls  CT HEAD WITHOUT CONTRAST CT CERVICAL SPINE WITHOUT CONTRAST  Technique:  Multidetector CT imaging of the head and cervical spine was performed following the standard protocol without intravenous contrast.  Multiplanar CT image reconstructions of the cervical spine were also generated.  Comparison:  03/28/2013  CT HEAD  Findings: The bony calvarium is intact.  No gross soft tissue abnormality is seen.  Ventricles are mildly prominent consistent with mild cortical atrophy.  No findings to suggest acute hemorrhage, acute infarction or space-occupying mass lesion are noted.  IMPRESSION: Chronic changes without acute abnormality.  CT CERVICAL SPINE  Findings: Seven cervical segments are well visualized.  Prior fusion is noted at C5-6.  Anterior osteophytes are noted at C4-5 and C6-7. No acute fracture is identified.  No acute facet abnormality is seen.  The surrounding soft tissues show carotid calcifications.  The visualized portions of the lung apices are within normal limits.  Facet hypertrophic changes are seen.  IMPRESSION: Degenerative change and postsurgical change.  The overall  appearance is stable from prior exam.  No acute abnormality is noted.   Original  Report Authenticated By: Alcide Clever, M.D.   Ct Cervical Spine Wo Contrast  06/11/2013   *RADIOLOGY REPORT*  Clinical Data:  Dizziness with recent falls  CT HEAD WITHOUT CONTRAST CT CERVICAL SPINE WITHOUT CONTRAST  Technique:  Multidetector CT imaging of the head and cervical spine was performed following the standard protocol without intravenous contrast.  Multiplanar CT image reconstructions of the cervical spine were also generated.  Comparison:  03/28/2013  CT HEAD  Findings: The bony calvarium is intact.  No gross soft tissue abnormality is seen.  Ventricles are mildly prominent consistent with mild cortical atrophy.  No findings to suggest acute hemorrhage, acute infarction or space-occupying mass lesion are noted.  IMPRESSION: Chronic changes without acute abnormality.  CT CERVICAL SPINE  Findings: Seven cervical segments are well visualized.  Prior fusion is noted at C5-6.  Anterior osteophytes are noted at C4-5 and C6-7. No acute fracture is identified.  No acute facet abnormality is seen.  The surrounding soft tissues show carotid calcifications.  The visualized portions of the lung apices are within normal limits.  Facet hypertrophic changes are seen.  IMPRESSION: Degenerative change and postsurgical change.  The overall appearance is stable from prior exam.  No acute abnormality is noted.   Original Report Authenticated By: Alcide Clever, M.D.    2D ECHO: Study Conclusions  - Left ventricle: The cavity size was normal. Wall thickness was normal. Systolic function was normal. The estimated ejection fraction was in the range of 60% to 65%. Wall motion was normal; there were no regional wall motion abnormalities. - Aortic valve: Mild regurgitation. - Pulmonary arteries: Systolic pressure was moderately increased. PA peak pressure: 60mm Hg (S   Disposition and Follow-up:     Discharge Orders   Future Orders  Complete By Expires     (HEART FAILURE PATIENTS) Call MD:  Anytime you have any of the following symptoms: 1) 3 pound weight gain in 24 hours or 5 pounds in 1 week 2) shortness of breath, with or without a dry hacking cough 3) swelling in the hands, feet or stomach 4) if you have to sleep on extra pillows at night in order to breathe.  As directed     Diet Carb Modified  As directed     Discharge instructions  As directed     Comments:      1) Please Hold lasix for another 2 days 2) Continue to hold metoprolol and losartan until you follow-up with Dr Nathanial Rancher or Dr Patty Sermons. 3) Check your BP twice a day, if running high (SBP>165, DBP >100), you can take an extra pill of hydralazine.    Increase activity slowly  As directed         DISPOSITION: Home DIET: Carb modified diet ACTIVITY as tolerated TESTS THAT NEED FOLLOW-UP Urine culture and sensitivity, BMET  DISCHARGE FOLLOW-UP Follow-up Information   Follow up with [facility], MD. Schedule an appointment as soon as possible for a visit in 10 days. (for hospital follow-up, you will need BMET/renal function checked on your appt)    Contact information:   Dr. Burnell Blanks [address] [place] [address] [phone]       Follow up with Cassell Clement, MD. Schedule an appointment as soon as possible for a visit in 2 weeks. (for hospital follow-up)    Contact information:   [address] [place] [address] [phone]       Time spent on Discharge: 40 MINS  Signed:  RAI,RIPUDEEP M.D. [facility] 06/13/2013, 11:03 AM Pager: [phone]

## 2013-06-13 NOTE — Progress Notes (Signed)
Physical Therapy Treatment Patient Details Name: Linda [name] MRN: [id number] DOB: 03-03-1930 Today's Date: 06/13/2013 Time: 5621-3086 PT Time Calculation (min): 30 min  PT Assessment / Plan / Recommendation  History of Present Illness Linda [name] is a 79 y.o. Caucasian female with history of diabetes, hypertension, TIA, chronic kidney disease stage II, and mild dementia who presents with UTi.   PT Comments   Pt is progressing well towards physical therapy goals. Spoke with pt's daughter regarding use of RW at home in place of [facility] for energy conservation and increased stability. Pt was able to improve distance ambulated with slight SOB noted, and HR at 87bpm at end of gait training.  Follow Up Recommendations   (Resume outpatient PT, add orders for LE deficits)     Does the patient have the potential to tolerate intense rehabilitation     Barriers to Discharge        Equipment Recommendations  None recommended by PT    Recommendations for Other Services    Frequency Min 2X/week   Progress towards PT Goals Progress towards PT goals: Progressing toward goals  Plan Current plan remains appropriate    Precautions / Restrictions Precautions Precautions: Fall Restrictions Weight Bearing Restrictions: No   Pertinent Vitals/Pain Pt reports pain during transfers in R side of ribs from fall - unrated on 0-10 scale; 87bpm HR at end of gait training.    Mobility  Bed Mobility Bed Mobility: Not assessed Transfers Transfers: Sit to Stand;Stand to Sit Sit to Stand: 4: Min guard Stand to Sit: 4: Min guard Details for Transfer Assistance: No physical assist needed Ambulation/Gait Ambulation/Gait Assistance: 4: Min guard Ambulation Distance (Feet): 200 Feet Assistive device: Rolling walker;Straight cane Ambulation/Gait Assistance Details: Pt able to ambulate with SPC with decreased gait speed and obvious discomfort due to decreased stability/balance. Pt was able to improve greatly  with use of RW and appeared comfortable and confident throughout gait training. Gait Pattern: Within Functional Limits Gait velocity: decreased General Gait Details: minor instability noted with SPC more than RW. Stairs: No Corporate treasurer: No    Exercises General Exercises - Lower Extremity Long Arc Quad: Both;15 reps Hip ABduction/ADduction: Both;15 reps;Seated (each; with manual resistance) Hip Flexion/Marching: Both;15 reps;Seated   PT Diagnosis:    PT Problem List:   PT Treatment Interventions:     PT Goals (current goals can now be found in the care plan section) Acute Rehab PT Goals Patient Stated Goal: to go home PT Goal Formulation: With patient Time For Goal Achievement: 06/26/13 Potential to Achieve Goals: Good  Visit Information  Last PT Received On: 06/13/13 Assistance Needed: +1 PT/OT Co-Evaluation/Treatment: Yes History of Present Illness: Linda [name] is a 79 y.o. Caucasian female with history of diabetes, hypertension, TIA, chronic kidney disease stage II, and mild dementia who presents with UTi.    Subjective Data  Subjective: "I feel really good now, thank you." Patient Stated Goal: to go home   Cognition  Cognition Arousal/Alertness: Lethargic (at first due to medication, then became awake/alert.) Behavior During Therapy: WFL for tasks assessed/performed Overall Cognitive Status: History of cognitive impairments - at baseline    Balance  Balance Balance Assessed: Yes Static Sitting Balance Static Sitting - Balance Support: Feet supported Static Sitting - Level of Assistance: 7: Independent Static Sitting - Comment/# of Minutes: 5 Dynamic Standing Balance Dynamic Standing - Balance Support: No upper extremity supported Dynamic Standing - Level of Assistance: 5: Stand by assistance High Level Balance High  Level Balance Comments: Minor LOB noted when lifting gown up to use commode, pt grabbed hand rail in bathroom and did  not require assist to recover.  End of Session PT - End of Session Equipment Utilized During Treatment: Gait belt Activity Tolerance: Patient limited by fatigue Patient left: in chair;with family/visitor present   GP     Ruthann Cancer 06/13/2013, 10:27 AM  Ruthann Cancer, PT Acute Rehabilitation Services

## 2013-06-13 NOTE — Progress Notes (Signed)
Reviewed discharge instructions with patient's daughter, she stated her understanding.  Patient discharged via wheelchair home with daughter to have followup outpatient therapy.  Colman Cater

## 2013-06-13 NOTE — Evaluation (Signed)
Occupational Therapy Evaluation Patient Details Name: Linda [name] MRN: [id number] DOB: 1929-11-23 Today's Date: 06/13/2013 Time: 1478-2956 OT Time Calculation (min): 23 min  OT Assessment / Plan / Recommendation History of present illness Linda [name] is a 79 y.o. Caucasian female with history of diabetes, hypertension, TIA, chronic kidney disease stage II, and mild dementia who presents with UTI, and falls.   Clinical Impression   Pt presents requiring min assist for ADL and min guard assist for mobility with decreased balance and impaired endurance.  Pt has a very supportive family who are capable of assisting her appropriately.  Pt was in OP therapy for her R shoulder PTA, recommend resumption upon d/c and 24 hour care.  Will follow acutely.    OT Assessment  Patient needs continued OT Services    Follow Up Recommendations  Supervision/Assistance - 24 hour (resume outpatient therapy for shoulder and balance)    Barriers to Discharge      Equipment Recommendations  None recommended by OT    Recommendations for Other Services    Frequency  Min 2X/week    Precautions / Restrictions Precautions Precautions: Fall Restrictions Weight Bearing Restrictions: No   Pertinent Vitals/Pain Soreness in R flank, did not rate, repositioned    ADL  Eating/Feeding: Independent Where Assessed - Eating/Feeding: Chair Grooming: Wash/dry hands;Min guard Where Assessed - Grooming: Unsupported standing Upper Body Bathing: Minimal assistance Where Assessed - Upper Body Bathing: Unsupported sitting Lower Body Bathing: Minimal assistance Where Assessed - Lower Body Bathing: Unsupported sitting;Supported sit to stand Upper Body Dressing: Minimal assistance Where Assessed - Upper Body Dressing: Unsupported sitting Lower Body Dressing: Minimal assistance Where Assessed - Lower Body Dressing: Unsupported sitting;Supported sit to stand Toilet Transfer: Min Pension scheme manager Method: Sit to  Barista: Regular height toilet;Grab bars Toileting - Architect and Hygiene: Min guard Where Assessed - Engineer, mining and Hygiene: Sit to stand from 3-in-1 or toilet Equipment Used: Gait belt;Rolling walker Transfers/Ambulation Related to ADLs: ambulated with RW and min guard assist, unsteady with cane ADL Comments: Limited by decreased activity tolerance and impaired balance    OT Diagnosis: Generalized weakness;Cognitive deficits  OT Problem List: Decreased strength;Decreased activity tolerance;Impaired balance (sitting and/or standing);Decreased cognition;Cardiopulmonary status limiting activity;Impaired UE functional use OT Treatment Interventions: Self-care/ADL training;DME and/or AE instruction;Patient/family education   OT Goals(Current goals can be found in the care plan section) Acute Rehab OT Goals Patient Stated Goal: to go home OT Goal Formulation: With patient Time For Goal Achievement: 06/20/13 Potential to Achieve Goals: Good ADL Goals Pt Will Perform Grooming: with supervision;standing Pt Will Perform Upper Body Bathing: with supervision;sitting Pt Will Perform Lower Body Bathing: with supervision;sit to/from stand Pt Will Perform Upper Body Dressing: with supervision;sitting Pt Will Perform Lower Body Dressing: with supervision;sit to/from stand Pt Will Transfer to Toilet: with supervision;ambulating Pt Will Perform Toileting - Clothing Manipulation and hygiene: with supervision;sit to/from stand  Visit Information  Last OT Received On: 06/13/13 Assistance Needed: +1 PT/OT Co-Evaluation/Treatment: Yes History of Present Illness: Linda [name] is a 79 y.o. Caucasian female with history of diabetes, hypertension, TIA, chronic kidney disease stage II, and mild dementia who presents with UTi.       Prior Functioning     Home Living Family/patient expects to be discharged to:: Private residence Living  Arrangements: Children Available Help at Discharge: Family;Available 24 hours/day Type of Home: House Home Access: Ramped entrance Home Layout: Able to live on main level with bedroom/bathroom Home Equipment:  Walker - 2 wheels;Cane - single point;Bedside commode Additional Comments: uses commode in shower Prior Function Level of Independence: Independent with assistive device(s) Comments: cane, history of falls recently Communication Communication: HOH Dominant Hand: Right         Vision/Perception Vision - History Baseline Vision: Wears glasses only for reading Patient Visual Report: No change from baseline   Cognition  Cognition Arousal/Alertness: Awake/alert Behavior During Therapy: WFL for tasks assessed/performed Overall Cognitive Status: History of cognitive impairments - at baseline    Extremity/Trunk Assessment Upper Extremity Assessment Upper Extremity Assessment: RUE deficits/detail RUE Deficits / Details: longstanding R shoulder limitations since reverse TSA, pt is in OP therapy for this     Mobility Bed Mobility Bed Mobility: Not assessed Transfers Transfers: Sit to Stand;Stand to Sit Sit to Stand: 4: Min guard Stand to Sit: 4: Min guard Details for Transfer Assistance: No physical assist needed     Exercise    Balance Balance Balance Assessed: Yes Static Sitting Balance Static Sitting - Balance Support: Feet supported Static Sitting - Level of Assistance: 7: Independent Static Sitting - Comment/# of Minutes: 5 Dynamic Standing Balance Dynamic Standing - Balance Support: No upper extremity supported Dynamic Standing - Level of Assistance: 5: Stand by assistance High Level Balance High Level Balance Comments: Minor LOB noted when lifting gown up to use commode, pt grabbed hand rail in bathroom and did not require assist to recover.   End of Session OT - End of Session Activity Tolerance: Patient limited by fatigue Patient left: in chair;with call  bell/phone within reach;with family/visitor present Nurse Communication: Mobility status  GO     Evern Bio 06/13/2013, 11:54 AM [phone]

## 2013-06-16 NOTE — ED Provider Notes (Signed)
I saw and evaluated the patient, reviewed the resident's note and I agree with the findings and plan.  79 year old female with syncope. Do not feel that she is low risk. Will defer further evaluation.  Raeford Razor, MD 06/16/13 1218

## 2013-10-22 IMAGING — CR DG SHOULDER 2+V*R*
2 series · 2 of 2 positions shown · non-contrast
Comparison: 03/28/2013

CLINICAL DATA: Fall 3 weeks ago

RIGHT SHOULDER - 2+ VIEW

[w shoulder ap internal righ]
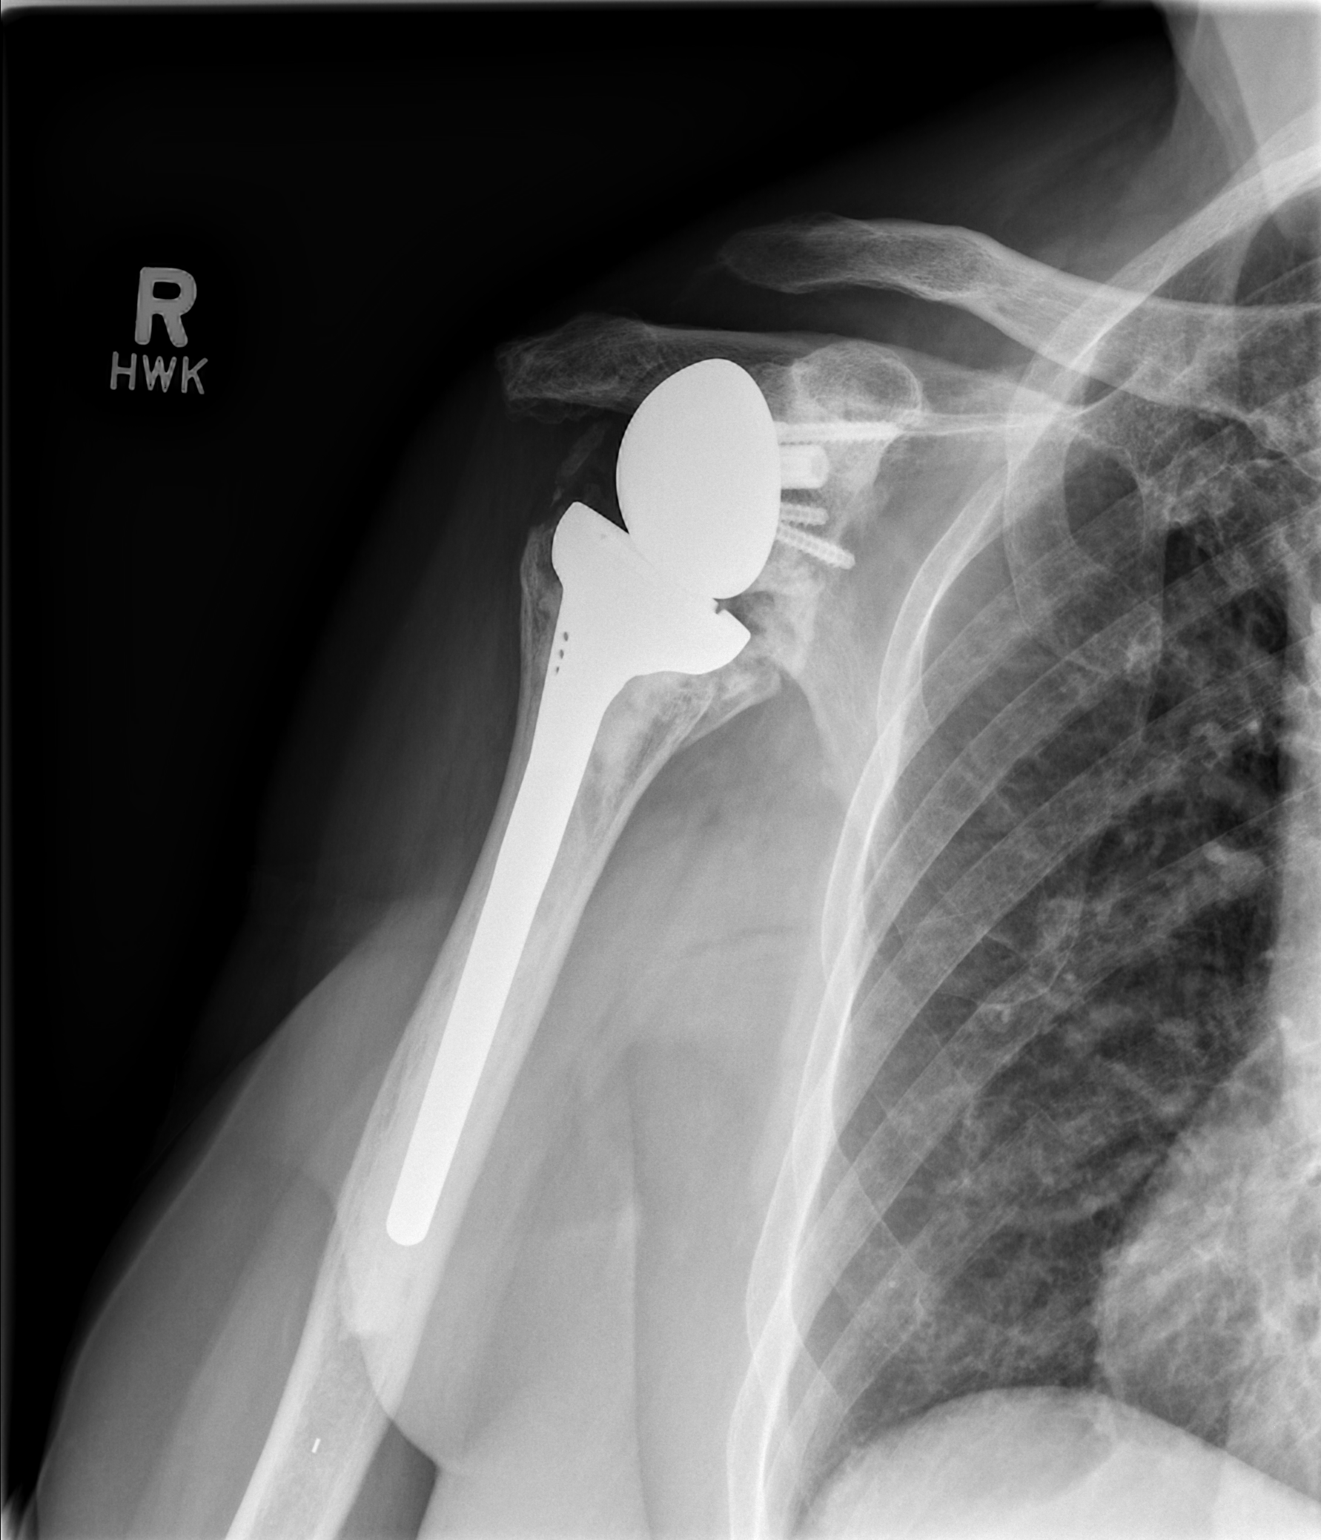

[w shoulder y view right]
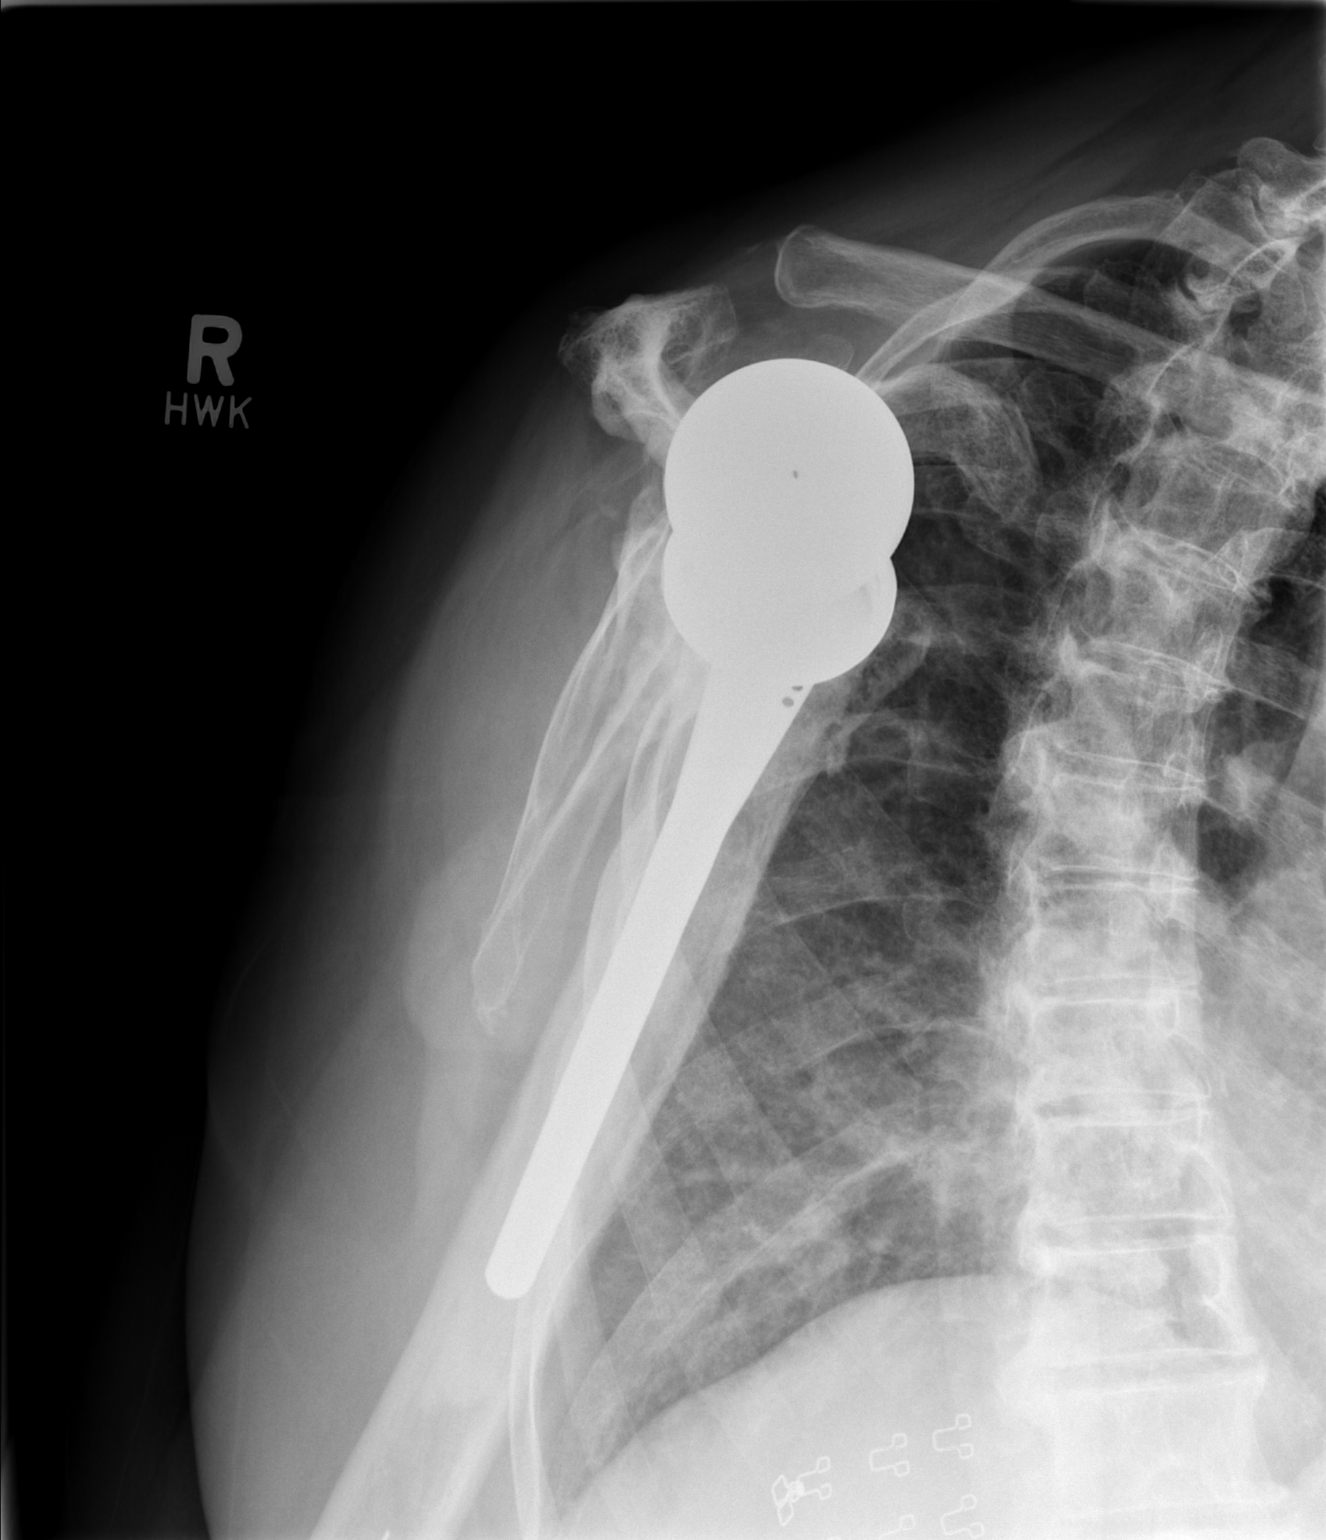

[2 of 2 positions shown; findings below may reference images not displayed]

FINDINGS: Portable shoulder prosthesis in satisfactory position and
alignment and unchanged from prior study.

AC separation with progressive inferior displacement of the
acromion relative to the clavicle compared to the prior study.  The
separation was noted on the prior study.

Negative for fracture.
IMPRESSION: Negative for fracture.  Progressive displacement of the AC
separation.

## 2014-03-03 ENCOUNTER — Other Ambulatory Visit: Payer: Self-pay

## 2014-03-03 LAB — URINALYSIS, COMPLETE
Bilirubin,UR: NEGATIVE
GLUCOSE, UR: NEGATIVE mg/dL (ref 0–75)
Ketone: NEGATIVE
NITRITE: NEGATIVE
PH: 5 (ref 4.5–8.0)
Protein: NEGATIVE
RBC,UR: 7 /HPF (ref 0–5)
Specific Gravity: 1.011 (ref 1.003–1.030)
Squamous Epithelial: <1
WBC UR: 303 /HPF (ref 0–5)

## 2014-03-05 LAB — URINE CULTURE

## 2014-03-08 ENCOUNTER — Other Ambulatory Visit: Payer: Self-pay

## 2014-03-08 LAB — URINALYSIS, COMPLETE
BILIRUBIN, UR: NEGATIVE
GLUCOSE, UR: NEGATIVE mg/dL (ref 0–75)
KETONE: NEGATIVE
Nitrite: NEGATIVE
Ph: 5 (ref 4.5–8.0)
RBC,UR: 40 /HPF (ref 0–5)
SPECIFIC GRAVITY: 1.016 (ref 1.003–1.030)
WBC UR: 148 /HPF (ref 0–5)

## 2014-03-09 LAB — URINE CULTURE

## 2014-04-10 ENCOUNTER — Encounter (HOSPITAL_COMMUNITY): Payer: Self-pay | Admitting: Emergency Medicine

## 2014-04-10 ENCOUNTER — Emergency Department (HOSPITAL_COMMUNITY): Payer: Medicare Other

## 2014-04-10 ENCOUNTER — Inpatient Hospital Stay (HOSPITAL_COMMUNITY)
Admission: EM | Admit: 2014-04-10 | Discharge: 2014-04-14 | DRG: 689 | Disposition: A | Discharge status: Skilled Nursing Facility | Payer: Medicare Other | Attending: Internal Medicine | Admitting: Internal Medicine

## 2014-04-10 DIAGNOSIS — Z79899 Other long term (current) drug therapy: Secondary | ICD-10-CM

## 2014-04-10 DIAGNOSIS — R4789 Other speech disturbances: Secondary | ICD-10-CM | POA: Diagnosis present

## 2014-04-10 DIAGNOSIS — N390 Urinary tract infection, site not specified: Principal | ICD-10-CM | POA: Diagnosis present

## 2014-04-10 DIAGNOSIS — G8929 Other chronic pain: Secondary | ICD-10-CM | POA: Diagnosis present

## 2014-04-10 DIAGNOSIS — I129 Hypertensive chronic kidney disease with stage 1 through stage 4 chronic kidney disease, or unspecified chronic kidney disease: Secondary | ICD-10-CM | POA: Diagnosis present

## 2014-04-10 DIAGNOSIS — N179 Acute kidney failure, unspecified: Secondary | ICD-10-CM | POA: Diagnosis present

## 2014-04-10 DIAGNOSIS — E119 Type 2 diabetes mellitus without complications: Secondary | ICD-10-CM | POA: Diagnosis present

## 2014-04-10 DIAGNOSIS — R4781 Slurred speech: Secondary | ICD-10-CM | POA: Diagnosis present

## 2014-04-10 DIAGNOSIS — I10 Essential (primary) hypertension: Secondary | ICD-10-CM

## 2014-04-10 DIAGNOSIS — R112 Nausea with vomiting, unspecified: Secondary | ICD-10-CM

## 2014-04-10 DIAGNOSIS — Z8673 Personal history of transient ischemic attack (TIA), and cerebral infarction without residual deficits: Secondary | ICD-10-CM

## 2014-04-10 DIAGNOSIS — N189 Chronic kidney disease, unspecified: Secondary | ICD-10-CM

## 2014-04-10 DIAGNOSIS — Z96659 Presence of unspecified artificial knee joint: Secondary | ICD-10-CM

## 2014-04-10 DIAGNOSIS — K869 Disease of pancreas, unspecified: Secondary | ICD-10-CM

## 2014-04-10 DIAGNOSIS — F0390 Unspecified dementia without behavioral disturbance: Secondary | ICD-10-CM | POA: Diagnosis present

## 2014-04-10 DIAGNOSIS — D649 Anemia, unspecified: Secondary | ICD-10-CM | POA: Diagnosis present

## 2014-04-10 DIAGNOSIS — R627 Adult failure to thrive: Secondary | ICD-10-CM | POA: Diagnosis present

## 2014-04-10 DIAGNOSIS — G9340 Encephalopathy, unspecified: Secondary | ICD-10-CM | POA: Diagnosis present

## 2014-04-10 DIAGNOSIS — N184 Chronic kidney disease, stage 4 (severe): Secondary | ICD-10-CM | POA: Diagnosis present

## 2014-04-10 DIAGNOSIS — K8689 Other specified diseases of pancreas: Secondary | ICD-10-CM | POA: Diagnosis present

## 2014-04-10 HISTORY — DX: Reserved for concepts with insufficient information to code with codable children: IMO0002

## 2014-04-10 LAB — PROTIME-INR
INR: 1.11 (ref 0.00–1.49)
PROTHROMBIN TIME: 14.1 s (ref 11.6–15.2)

## 2014-04-10 LAB — CBC WITH DIFFERENTIAL/PLATELET
BASOS ABS: 0 10*3/uL (ref 0.0–0.1)
BASOS PCT: 0 % (ref 0–1)
EOS ABS: 0.2 10*3/uL (ref 0.0–0.7)
Eosinophils Relative: 3 % (ref 0–5)
HCT: 37.8 % (ref 36.0–46.0)
Hemoglobin: 11.9 g/dL — ABNORMAL LOW (ref 12.0–15.0)
Lymphocytes Relative: 28 % (ref 12–46)
Lymphs Abs: 2.1 10*3/uL (ref 0.7–4.0)
MCH: 29.2 pg (ref 26.0–34.0)
MCHC: 31.5 g/dL (ref 30.0–36.0)
MCV: 92.6 fL (ref 78.0–100.0)
Monocytes Absolute: 0.5 10*3/uL (ref 0.1–1.0)
Monocytes Relative: 7 % (ref 3–12)
NEUTROS PCT: 62 % (ref 43–77)
Neutro Abs: 4.6 10*3/uL (ref 1.7–7.7)
PLATELETS: 203 10*3/uL (ref 150–400)
RBC: 4.08 MIL/uL (ref 3.87–5.11)
RDW: 14 % (ref 11.5–15.5)
WBC: 7.4 10*3/uL (ref 4.0–10.5)

## 2014-04-10 LAB — URINALYSIS, ROUTINE W REFLEX MICROSCOPIC
Glucose, UA: NEGATIVE mg/dL
Ketones, ur: NEGATIVE mg/dL
NITRITE: NEGATIVE
PH: 5 (ref 5.0–8.0)
Protein, ur: 100 mg/dL — AB
SPECIFIC GRAVITY, URINE: 1.021 (ref 1.005–1.030)
Urobilinogen, UA: 1 mg/dL (ref 0.0–1.0)

## 2014-04-10 LAB — COMPREHENSIVE METABOLIC PANEL
ALBUMIN: 3.3 g/dL — AB (ref 3.5–5.2)
ALK PHOS: 49 U/L (ref 39–117)
ALT: 7 U/L (ref 0–35)
AST: 13 U/L (ref 0–37)
BUN: 36 mg/dL — ABNORMAL HIGH (ref 6–23)
CO2: 24 mEq/L (ref 19–32)
Calcium: 9.5 mg/dL (ref 8.4–10.5)
Chloride: 104 mEq/L (ref 96–112)
Creatinine, Ser: 1.73 mg/dL — ABNORMAL HIGH (ref 0.50–1.10)
GFR calc Af Amer: 30 mL/min — ABNORMAL LOW (ref >90)
GFR calc non Af Amer: 26 mL/min — ABNORMAL LOW (ref >90)
Glucose, Bld: 97 mg/dL (ref 70–99)
POTASSIUM: 4.7 meq/L (ref 3.7–5.3)
SODIUM: 141 meq/L (ref 137–147)
TOTAL PROTEIN: 6.2 g/dL (ref 6.0–8.3)
Total Bilirubin: 0.3 mg/dL (ref 0.3–1.2)

## 2014-04-10 LAB — URINE MICROSCOPIC-ADD ON

## 2014-04-10 LAB — AMMONIA: Ammonia: 34 umol/L (ref 11–60)

## 2014-04-10 LAB — GLUCOSE, CAPILLARY: Glucose-Capillary: 80 mg/dL (ref 70–99)

## 2014-04-10 LAB — TROPONIN I

## 2014-04-10 LAB — PRO B NATRIURETIC PEPTIDE: PRO B NATRI PEPTIDE: 239 pg/mL (ref 0–450)

## 2014-04-10 MED ORDER — ACETAMINOPHEN 325 MG PO TABS: 650.0000 mg | ORAL_TABLET | Freq: Four times a day (QID) | ORAL | Status: DC | PRN

## 2014-04-10 MED ORDER — ONDANSETRON HCL 4 MG PO TABS: 4.0000 mg | ORAL_TABLET | Freq: Four times a day (QID) | ORAL | Status: DC | PRN

## 2014-04-10 MED ORDER — ESCITALOPRAM OXALATE 20 MG PO TABS
20.0000 mg | ORAL_TABLET | Freq: Every day | ORAL | Status: DC
  Administered 2014-04-11 – 2014-04-14 (×4): 20 mg via ORAL
  Filled 2014-04-10 (×4): qty 1

## 2014-04-10 MED ORDER — ACETAMINOPHEN 650 MG RE SUPP: 650.0000 mg | Freq: Four times a day (QID) | RECTAL | Status: DC | PRN

## 2014-04-10 MED ORDER — OXYCODONE HCL 5 MG PO TABS
5.0000 mg | ORAL_TABLET | Freq: Three times a day (TID) | ORAL | Status: DC
  Administered 2014-04-11 (×2): 5 mg via ORAL
  Filled 2014-04-10 (×2): qty 1

## 2014-04-10 MED ORDER — SODIUM CHLORIDE 0.9 % IV BOLUS (SEPSIS): 1000.0000 mL | Freq: Once | INTRAVENOUS | Status: DC

## 2014-04-10 MED ORDER — SODIUM CHLORIDE 0.9 % IV SOLN: Freq: Once | INTRAVENOUS | Status: AC

## 2014-04-10 MED ORDER — TRAZODONE HCL 100 MG PO TABS
100.0000 mg | ORAL_TABLET | Freq: Every day | ORAL | Status: DC
  Administered 2014-04-11 – 2014-04-13 (×4): 100 mg via ORAL
  Filled 2014-04-10 (×5): qty 1

## 2014-04-10 MED ORDER — DONEPEZIL HCL 10 MG PO TABS
20.0000 mg | ORAL_TABLET | Freq: Every day | ORAL | Status: DC
  Administered 2014-04-11 – 2014-04-13 (×4): 20 mg via ORAL
  Filled 2014-04-10 (×5): qty 2

## 2014-04-10 MED ORDER — OXYCODONE-ACETAMINOPHEN 10-325 MG PO TABS: 1.0000 | ORAL_TABLET | Freq: Three times a day (TID) | ORAL | Status: DC

## 2014-04-10 MED ORDER — DEXTROSE 5 % IV SOLN
1.0000 g | Freq: Once | INTRAVENOUS | Status: AC
  Administered 2014-04-10: 1 g via INTRAVENOUS
  Filled 2014-04-10: qty 10

## 2014-04-10 MED ORDER — DEXTROSE 5 % IV SOLN
1.0000 g | INTRAVENOUS | Status: DC
  Administered 2014-04-11 – 2014-04-13 (×3): 1 g via INTRAVENOUS
  Filled 2014-04-10 (×4): qty 10

## 2014-04-10 MED ORDER — ONDANSETRON HCL 4 MG/2ML IJ SOLN: 4.0000 mg | Freq: Four times a day (QID) | INTRAMUSCULAR | Status: DC | PRN

## 2014-04-10 MED ORDER — RISPERIDONE 1 MG PO TABS
1.0000 mg | ORAL_TABLET | Freq: Two times a day (BID) | ORAL | Status: DC
  Administered 2014-04-11 – 2014-04-14 (×8): 1 mg via ORAL
  Filled 2014-04-10 (×9): qty 1

## 2014-04-10 MED ORDER — OXYCODONE-ACETAMINOPHEN 5-325 MG PO TABS
1.0000 | ORAL_TABLET | Freq: Three times a day (TID) | ORAL | Status: DC
  Administered 2014-04-11 (×2): 1 via ORAL
  Filled 2014-04-10 (×2): qty 1

## 2014-04-10 MED ORDER — SODIUM CHLORIDE 0.9 % IV SOLN
INTRAVENOUS | Status: DC
  Administered 2014-04-11 – 2014-04-13 (×4): via INTRAVENOUS

## 2014-04-10 MED ORDER — SODIUM CHLORIDE 0.9 % IV SOLN
INTRAVENOUS | Status: AC
  Administered 2014-04-10: via INTRAVENOUS

## 2014-04-10 MED ORDER — SODIUM CHLORIDE 0.9 % IJ SOLN
3.0000 mL | Freq: Two times a day (BID) | INTRAMUSCULAR | Status: DC
  Administered 2014-04-12 – 2014-04-13 (×3): 3 mL via INTRAVENOUS

## 2014-04-10 MED ORDER — HEPARIN SODIUM (PORCINE) 5000 UNIT/ML IJ SOLN
5000.0000 [IU] | Freq: Three times a day (TID) | INTRAMUSCULAR | Status: DC
  Administered 2014-04-11 – 2014-04-14 (×8): 5000 [IU] via SUBCUTANEOUS
  Filled 2014-04-10 (×12): qty 1

## 2014-04-10 NOTE — ED Notes (Signed)
Unable to obtain IV access x 2  

## 2014-04-10 NOTE — ED Notes (Signed)
2 RNs attempted to obtain IV access. IV team paged and returned page stating they would be down to stick pt.

## 2014-04-10 NOTE — H&P (Addendum)
[facility] History and Physical  Patient: Linda [name]  TDD:[id number]  DOB: Oct 04, 1930  DOS: the patient was seen and examined on 04/10/2014 PCP: Leonides Sake, MD  Chief Complaint: Slurred speech and confusion, increase urinary frequency and  HPI: TING CAGE is a 80 y.o. female with Past medical history of diabetes mellitus, hypertension, TIA, chronic kidney disease, dementia. The patient was brought in by daughter. History was obtained from Daughter. The patient presented with complaints of confusion increasing urinary frequency and complains of burning urination. This was associated with generalized weakness and tiredness. Patient has history of UTI in the past. Patient had a home health nurse who came to visit her and the patient was found more confused than her baseline along with having some lethargy and therefore patient was initially observed and since she was continued in the getting worse was brought to the hospital. No recent change in her medications. No recent antibiotic use in the last 1 month. No recent procedures. She also had multiple episodes of vomiting and nausea until arrival to the ED. She had some mild epigastric tenderness on arrival as per ED physician. Her renal function appears to be at her baseline, she has been getting her medication on a regular basis. Patient also was noted to have some slurring of her speech which has been present since last few days on and off occurring and resolving on its own. Other than that she did not have any neurological findings as per the family.  The patient is coming from home. And at her baseline independent for most of her ADL.  Review of Systems: as mentioned in the history of present illness.  A Comprehensive review of the other systems is negative.  Past Medical History  Diagnosis Date  . Allergy     allergic rhinitis  . Diabetes mellitus     type II  . Hypertension   . Osteopenia   . TIA (transient  ischemic attack)   . Contact dermatitis     on buttocks  . Nosebleed   . Anxiety   . Renal insufficiency   . Pyelonephritis   . Dementia   . Chiari malformation    Past Surgical History  Procedure Laterality Date  . Rotator cuff repair  06/2007    right  . Abdominal hysterectomy    . Back surgery  03/1999    disk  . Breast surgery      breast biopsy x2 negative  . Shoulder surgery  2007    left  . Joint replacement      Rt knee patella replacement  . Colonoscopy  08/28/2012    Procedure: COLONOSCOPY;  Surgeon: Beryle Beams, MD;  Location: [facility] ENDOSCOPY;  Service: Endoscopy;  Laterality: N/A;   Social History:  reports that she has never smoked. She does not have any smokeless tobacco history on file. She reports that she does not drink alcohol or use illicit drugs.  Allergies  Allergen Reactions  . Amoxicillin Other (See Comments)    REACTION: aching all over  . Atorvastatin Other (See Comments)    REACTION: increased CPK  . Cephalexin Nausea Only and Other (See Comments)    Weak, Headache  . Cetirizine Hcl Other (See Comments)    REACTION: makes her head feel big  . Cortisone Other (See Comments)    REACTION: increased bp  . Ezetimibe-Simvastatin Other (See Comments)    unknown  . Fentanyl Other (See Comments)    REACTION: stinging in her  skin  . Guaifenesin Other (See Comments)    unknown  . Ketorolac Tromethamine     REACTION: no work  . Lisinopril Cough    ACE related  . Metformin Other (See Comments)    epistaxis  . Nsaids Other (See Comments)    epistaxis  . Penicillins Nausea Only and Other (See Comments)    headache  . Rofecoxib Itching  . Rosuvastatin Other (See Comments)    Myalgias  . Simvastatin Other (See Comments)    unknown  . Spironolactone Nausea Only  . Sulfamethoxazole-Tmp Ds Other (See Comments)    unknown  . Codeine Rash    Family History  Problem Relation Age of Onset  . Stroke Mother   . Stroke Father     Prior to  Admission medications   Medication Sig Start Date End Date Taking? Authorizing Provider  alendronate (FOSAMAX) 70 MG tablet Take 70 mg by mouth every 7 (seven) days. Saturday. Take with a full glass of water on an empty stomach.   Yes Historical Provider, MD  donepezil (ARICEPT) 10 MG tablet Take 20 mg by mouth at bedtime.    Yes Historical Provider, MD  escitalopram (LEXAPRO) 20 MG tablet Take 20 mg by mouth daily. For nerves   Yes Historical Provider, MD  glucose blood test strip Ck glucose three times daily and as needed.    Yes Historical Provider, MD  nystatin (MYCOSTATIN) powder Apply topically daily. Apply under breasts and under stomach fold.   Yes Historical Provider, MD  ondansetron (ZOFRAN) 4 MG tablet Take 4 mg by mouth every 8 (eight) hours as needed for nausea or vomiting.   Yes Historical Provider, MD  oxyCODONE-acetaminophen (PERCOCET) 10-325 MG per tablet Take 1 tablet by mouth 3 (three) times daily.    Yes Historical Provider, MD  risperiDONE (RISPERDAL) 0.5 MG tablet Take 1 mg by mouth 2 (two) times daily. 04/04/14  Yes Historical Provider, MD  senna (SENOKOT) 8.6 MG TABS tablet Take 1 tablet by mouth daily as needed for mild constipation.   Yes Historical Provider, MD  traZODone (DESYREL) 50 MG tablet Take 100 mg by mouth at bedtime. 03/21/14  Yes Historical Provider, MD    Physical Exam: Filed Vitals:   04/10/14 1938 04/10/14 2048 04/10/14 2100 04/10/14 2200  BP: 135/68 137/50 146/44 125/55  Pulse: 63 62 59 68  Temp:      TempSrc:      Resp: 18 14 17 18   SpO2: 97% 98% 95% 95%    General: Alert, Awake and Oriented to Time, Place and Person. Appear in mild distress Eyes: PERRL ENT: Oral Mucosa clear dry. Neck: no JVD Cardiovascular: S1 and S2 Present, no Murmur, Peripheral Pulses Present Respiratory: Bilateral Air entry equal and Decreased, Clear to Auscultation,  no Crackles,no wheezes Abdomen: Bowel Sound Present, Soft and Non tender Skin: no Rash Extremities: no  Pedal edema, no calf tenderness Neurologic: Grossly no focal neuro deficit.  Labs on Admission:  CBC:  Recent Labs Lab 04/10/14 1855  WBC 7.4  NEUTROABS 4.6  HGB 11.9*  HCT 37.8  MCV 92.6  PLT 203    CMP     Component Value Date/Time   NA 141 04/10/2014 1855   K 4.7 04/10/2014 1855   CL 104 04/10/2014 1855   CO2 24 04/10/2014 1855   GLUCOSE 97 04/10/2014 1855   BUN 36* 04/10/2014 1855   CREATININE 1.73* 04/10/2014 1855   CALCIUM 9.5 04/10/2014 1855   PROT 6.2 04/10/2014 1855  ALBUMIN 3.3* 04/10/2014 1855   AST 13 04/10/2014 1855   ALT 7 04/10/2014 1855   ALKPHOS 49 04/10/2014 1855   BILITOT 0.3 04/10/2014 1855   GFRNONAA 26* 04/10/2014 1855   GFRAA 30* 04/10/2014 1855    No results found for this basename: LIPASE, AMYLASE,  in the last 168 hours  Recent Labs Lab 04/10/14 1855  AMMONIA 34     Recent Labs Lab 04/10/14 1855  TROPONINI <0.30   BNP (last 3 results)  Recent Labs  04/10/14 1855  PROBNP 239.0    Radiological Exams on Admission: Dg Chest 2 View  04/10/2014   CLINICAL DATA:  Altered mental status, fall  EXAM: CHEST  2 VIEW  COMPARISON:  Prior chest x-ray 06/11/2013  FINDINGS: Stable cardiac and mediastinal contours. Atherosclerotic calcification present in the transverse aorta. Surgical changes of prior right shoulder arthroplasty. No evidence of hardware complication in the visualized portion. No pneumothorax, pleural effusion or focal airspace consolidation. Stable bronchitic changes and interstitial prominence. Incompletely imaged anterior cervical fusion hardware at C6-C7. No acute fracture. Multilevel degenerative spurring throughout the thoracic spine.  IMPRESSION: Stable chest x-ray without evidence of acute cardiopulmonary process or fracture.   Electronically Signed   By: Jacqulynn Cadet M.D.   On: 04/10/2014 20:48   Dg Hip Complete Right  04/10/2014   CLINICAL DATA:  Fall.  Right hip pain.  EXAM: RIGHT HIP - COMPLETE 2+ VIEW  COMPARISON:  03/28/2013  FINDINGS: No  fracture or bone lesion.  There is mild narrowing of the superior lateral hip joint spaces bilaterally with bony prominence from the superior acetabula and mild superior lateral subchondral acetabular cystic change. This is stable. There are bony productive changes from the greater trochanters bilaterally.  Bones are diffusely demineralized. Bony pelvis is intact. SI joints and symphysis pubis are normally spaced and aligned.  Soft tissues are unremarkable.  IMPRESSION: No fracture or acute finding.  No change from the prior study.   Electronically Signed   By: Lajean Manes M.D.   On: 04/10/2014 20:50   Ct Head Wo Contrast  04/10/2014   CLINICAL DATA:  Altered mental status and failure to thrive. Dementia.  EXAM: CT HEAD WITHOUT CONTRAST  TECHNIQUE: Contiguous axial images were obtained from the base of the skull through the vertex without intravenous contrast.  COMPARISON:  06/11/2013  FINDINGS: Sinuses/Soft tissues: Minimal ethmoid air cell mucosal thickening. Hyperostosis frontalis interna. Clear mastoid air cells.  Intracranial: Suspect a small left frontal lobe meningioma of approximately 9 mm on image 21/series 2. Grossly similar to on the prior exam. No surrounding cerebral edema or significant mass effect. Mild low density in the periventricular white matter likely related to small vessel disease. Otherwise, no mass lesion, hemorrhage, hydrocephalus, acute infarct, intra-axial, or extra-axial fluid collection.  IMPRESSION: 1. No acute intracranial abnormality. Mild small vessel ischemic change. 2. Small left frontal lobe meningioma, not significantly changed. 3. Minimal sinus disease.   Electronically Signed   By: Abigail Miyamoto M.D.   On: 04/10/2014 19:59    EKG: Independently reviewed. normal sinus rhythm, nonspecific ST and T waves changes.  Assessment/Plan Principal Problem:   Acute encephalopathy Active Problems:   DIABETES MELLITUS, TYPE II   HYPERTENSION   Anemia   Dementia   Acute on  chronic renal failure   Urinary tract infection   Slurred speech   1. Acute encephalopathy The patient is presenting with complaints of slurred speech and confusion. She also was found to have nausea or  vomiting as well as increased urinary frequency. She appears to have a UTI in her urine with that the patient was admitted to the hospital. At present most likely her current presentation is secondary to urinary tract infection. We will treat her with IV ceftriaxone. Monitor her improvement with that. Serial neuro checks, telemetry monitoring, MRI in the morning.  2. History of diabetes mellitus. Not on any antidiabetic medications. Blood glucose at present stable. Monitor.  3. Dementia. Continue Aricept and Lexapro.  4. Chronic pain. Continue Percocet  DVT Prophylaxis: subcutaneous Heparin Nutrition: Cardiac and diabetic  Code Status: Full  Family Communication: Daughter was present at bedside, opportunity was given to ask question and all questions were answered satisfactorily at the time of interview. Disposition: Admitted to observation in telemetry unit.  Author: Berle Mull, MD [facility] Pager: [phone] 04/10/2014, 10:34 PM    If 7PM-7AM, please contact night-coverage [email or web address] Password [email or web address]

## 2014-04-10 NOTE — ED Notes (Signed)
IV team at bedside 

## 2014-04-10 NOTE — ED Notes (Signed)
Nikki on 6E notified that pt has IV and is on her way to unit at this time

## 2014-04-10 NOTE — ED Notes (Signed)
Pt reports to the ED for eval of altered mental status. Per EMS pts family reports she has not been acting herself today. The pt is a hospice patient and has chronic kidney failure and the family member reports she is worried her kidney function tests are high again. Pt able to stand and pivot to sit in the bed. Pt lethargic and only oriented to person. Pt has hx of alzheimer's. VSS en route. Pt complaining of right hip pain that she reports began today. Resp e/u and skin warm and dry.

## 2014-04-10 NOTE — ED Provider Notes (Signed)
CSN: [id number]     Arrival date & time 04/10/14  1758 History   First MD Initiated Contact with Patient 04/10/14 1813     Chief Complaint  Patient presents with  . Failure To Thrive     (Consider location/radiation/quality/duration/timing/severity/associated sxs/prior Treatment) HPI Comments: Patient from home with change in mental status for the past day per family members. Patient oriented to self only. She has a history of dementia. She was complaining of some chest tightness earlier which has since resolved. She denies any head, neck, back or abdominal pain. Nursing note endorses right hip pain which she denies to me. No falls. Family member is concerned for worsening kidney function or urinary tract infection. Level 5 caveat for dementia.  Daughter reports intermittent slurred speech of the past several days as well.  Patient has not been eating or drinking much and complains of burning with urination.  The history is provided by the patient and a relative. The history is limited by the condition of the patient.    Past Medical History  Diagnosis Date  . Allergy     allergic rhinitis  . Diabetes mellitus     type II  . Hypertension   . Osteopenia   . TIA (transient ischemic attack)   . Contact dermatitis     on buttocks  . Nosebleed   . Anxiety   . Renal insufficiency   . Pyelonephritis   . Dementia   . Chiari malformation    Past Surgical History  Procedure Laterality Date  . Rotator cuff repair  06/2007    right  . Abdominal hysterectomy    . Back surgery  03/1999    disk  . Breast surgery      breast biopsy x2 negative  . Shoulder surgery  2007    left  . Joint replacement      Rt knee patella replacement  . Colonoscopy  08/28/2012    Procedure: COLONOSCOPY;  Surgeon: Beryle Beams, MD;  Location: [facility] ENDOSCOPY;  Service: Endoscopy;  Laterality: N/A;   Family History  Problem Relation Age of Onset  . Stroke Mother   . Stroke Father    History   Substance Use Topics  . Smoking status: Never Smoker   . Smokeless tobacco: Not on file  . Alcohol Use: No   OB History   Grav Para Term Preterm Abortions TAB SAB Ect Mult Living                 Review of Systems  Unable to perform ROS: Dementia      Allergies  Amoxicillin; Atorvastatin; Cephalexin; Cetirizine hcl; Cortisone; Ezetimibe-simvastatin; Fentanyl; Guaifenesin; Ketorolac tromethamine; Lisinopril; Metformin; Nsaids; Penicillins; Rofecoxib; Rosuvastatin; Simvastatin; Spironolactone; Sulfamethoxazole-tmp ds; Codeine; and Tape  Home Medications   Prior to Admission medications   Medication Sig Start Date End Date Taking? Authorizing Provider  alendronate (FOSAMAX) 70 MG tablet Take 70 mg by mouth every 7 (seven) days. Saturday. Take with a full glass of water on an empty stomach.   Yes Historical Provider, MD  donepezil (ARICEPT) 10 MG tablet Take 20 mg by mouth at bedtime.    Yes Historical Provider, MD  escitalopram (LEXAPRO) 20 MG tablet Take 20 mg by mouth daily. For nerves   Yes Historical Provider, MD  glucose blood test strip Ck glucose three times daily and as needed.    Yes Historical Provider, MD  nystatin (MYCOSTATIN) powder Apply topically daily. Apply under breasts and under stomach fold.  Yes Historical Provider, MD  ondansetron (ZOFRAN) 4 MG tablet Take 4 mg by mouth every 8 (eight) hours as needed for nausea or vomiting.   Yes Historical Provider, MD  oxyCODONE-acetaminophen (PERCOCET) 10-325 MG per tablet Take 1 tablet by mouth 3 (three) times daily.    Yes Historical Provider, MD  risperiDONE (RISPERDAL) 0.5 MG tablet Take 1 mg by mouth 2 (two) times daily. 04/04/14  Yes Historical Provider, MD  senna (SENOKOT) 8.6 MG TABS tablet Take 1 tablet by mouth daily as needed for mild constipation.   Yes Historical Provider, MD  traZODone (DESYREL) 50 MG tablet Take 100 mg by mouth at bedtime. 03/21/14  Yes Historical Provider, MD   BP 150/73  Pulse 60  Temp(Src)  97.8 F (36.6 C) (Oral)  Resp 16  Ht 5\' 5"  (1.651 m)  Wt 229 lb 14.4 oz (104.282 kg)  BMI 38.26 kg/m2  SpO2 94% Physical Exam  Constitutional: She appears well-developed and well-nourished. No distress.  HENT:  Head: Normocephalic and atraumatic.  Mouth/Throat: No oropharyngeal exudate.  Dry mucus membranes  Eyes: Conjunctivae and EOM are normal. Pupils are equal, round, and reactive to light. Right eye exhibits no discharge. Left eye exhibits no discharge.  Neck: Normal range of motion. Neck supple.  Cardiovascular: Normal rate, regular rhythm and intact distal pulses.   No murmur heard. Pulmonary/Chest: Effort normal and breath sounds normal. No respiratory distress. She has no wheezes.  Abdominal: Soft. There is tenderness. There is no rebound and no guarding.  Diffuse tenderness, no peritoneal signs  Musculoskeletal: Normal range of motion. She exhibits no edema and no tenderness.  FROM hips without pain  Neurological: She is alert. No cranial nerve deficit. She exhibits normal muscle tone. Coordination normal.  Oriented x1, moving all extremities  Skin: Skin is warm. Rash noted. She is not diaphoretic.  Erythematous rash to bilateral inguinal folds    ED Course  Procedures (including critical care time) Labs Review Labs Reviewed  CBC WITH DIFFERENTIAL - Abnormal; Notable for the following:    Hemoglobin 11.9 (*)    All other components within normal limits  COMPREHENSIVE METABOLIC PANEL - Abnormal; Notable for the following:    BUN 36 (*)    Creatinine, Ser 1.73 (*)    Albumin 3.3 (*)    GFR calc non Af Amer 26 (*)    GFR calc Af Amer 30 (*)    All other components within normal limits  URINALYSIS, ROUTINE W REFLEX MICROSCOPIC - Abnormal; Notable for the following:    Color, Urine AMBER (*)    APPearance TURBID (*)    Hgb urine dipstick MODERATE (*)    Bilirubin Urine SMALL (*)    Protein, ur 100 (*)    Leukocytes, UA LARGE (*)    All other components within  normal limits  URINE MICROSCOPIC-ADD ON - Abnormal; Notable for the following:    Squamous Epithelial / LPF FEW (*)    Bacteria, UA FEW (*)    All other components within normal limits  COMPREHENSIVE METABOLIC PANEL - Abnormal; Notable for the following:    Glucose, Bld 103 (*)    BUN 33 (*)    Creatinine, Ser 1.57 (*)    Total Protein 5.3 (*)    Albumin 2.8 (*)    GFR calc non Af Amer 30 (*)    GFR calc Af Amer 34 (*)    All other components within normal limits  CBC - Abnormal; Notable for the following:  RBC 3.54 (*)    Hemoglobin 10.4 (*)    HCT 32.3 (*)    All other components within normal limits  PROTIME-INR - Abnormal; Notable for the following:    Prothrombin Time 15.5 (*)    All other components within normal limits  URINE CULTURE  TROPONIN I  AMMONIA  PRO B NATRIURETIC PEPTIDE  PROTIME-INR  GLUCOSE, CAPILLARY    Imaging Review Ct Abdomen Pelvis Wo Contrast  04/11/2014   CLINICAL DATA:  Altered mental status. Chronic kidney failure. Lethargy. Alzheimer's. Right hip pain.  EXAM: CT ABDOMEN AND PELVIS WITHOUT CONTRAST  TECHNIQUE: Multidetector CT imaging of the abdomen and pelvis was performed following the standard protocol without IV contrast.  COMPARISON:  CT of the abdomen pelvis January 03, 2013.  FINDINGS: Included view of the lungs demonstrate mild pleural thickening dependent atelectasis. Heart size is normal, mitral annular calcifications. Pericardium is nonsuspicious.  The liver, spleen, adrenal glands are nonsuspicious. Layering tiny gallstones versus sludge in the gallbladder. Suspected 18 mm pancreatic mass within the body, axial 21/88, this was not clearly present on prior CT.  Similar mild fullness of the right renal collecting system with lobulated cortex. Atrophic left kidney with tiny lower pole calcifications. Stable 16 mm left upper pole cyst. Urinary bladder is partially distended, and otherwise unremarkable. Moderate to severe calcific atherosclerosis  of the aortoiliac vessels which are normal in course and caliber. Status post hysterectomy.  Small hiatal hernia. Stomach, small and large bowel are normal in course and caliber without inflammatory changes. The appendix is not discretely identified, however there are no inflammatory changes in the right lower quadrant. . Sigmoid diverticulosis. No intraperitoneal free fluid nor free air.  Osteopenia. Grade 1 L4-5 anterolisthesis on degenerative basis with moderate neural foraminal narrowing. Severe L5-S1 degenerative disc disease. Mild similar L1 compression fracture.  IMPRESSION: No acute intra-abdominal or pelvic process. Gallstones versus sludge without CT findings of acute cholecystitis.  Suspected 18 mm cystic pancreatic mass appears new, limited assessment on noncontrast examination. Findings would be better characterized on MRI pancreatic mass protocol, as clinically indicated.  Similar severe left renal atrophy/scarring with nonobstructing left lower pole calculi.   Electronically Signed   By: Elon Alas   On: 04/11/2014 01:45   Dg Chest 2 View  04/10/2014   CLINICAL DATA:  Altered mental status, fall  EXAM: CHEST  2 VIEW  COMPARISON:  Prior chest x-ray 06/11/2013  FINDINGS: Stable cardiac and mediastinal contours. Atherosclerotic calcification present in the transverse aorta. Surgical changes of prior right shoulder arthroplasty. No evidence of hardware complication in the visualized portion. No pneumothorax, pleural effusion or focal airspace consolidation. Stable bronchitic changes and interstitial prominence. Incompletely imaged anterior cervical fusion hardware at C6-C7. No acute fracture. Multilevel degenerative spurring throughout the thoracic spine.  IMPRESSION: Stable chest x-ray without evidence of acute cardiopulmonary process or fracture.   Electronically Signed   By: Jacqulynn Cadet M.D.   On: 04/10/2014 20:48   Dg Hip Complete Right  04/10/2014   CLINICAL DATA:  Fall.  Right hip  pain.  EXAM: RIGHT HIP - COMPLETE 2+ VIEW  COMPARISON:  03/28/2013  FINDINGS: No fracture or bone lesion.  There is mild narrowing of the superior lateral hip joint spaces bilaterally with bony prominence from the superior acetabula and mild superior lateral subchondral acetabular cystic change. This is stable. There are bony productive changes from the greater trochanters bilaterally.  Bones are diffusely demineralized. Bony pelvis is intact. SI joints and symphysis pubis are  normally spaced and aligned.  Soft tissues are unremarkable.  IMPRESSION: No fracture or acute finding.  No change from the prior study.   Electronically Signed   By: Lajean Manes M.D.   On: 04/10/2014 20:50   Ct Head Wo Contrast  04/10/2014   CLINICAL DATA:  Altered mental status and failure to thrive. Dementia.  EXAM: CT HEAD WITHOUT CONTRAST  TECHNIQUE: Contiguous axial images were obtained from the base of the skull through the vertex without intravenous contrast.  COMPARISON:  06/11/2013  FINDINGS: Sinuses/Soft tissues: Minimal ethmoid air cell mucosal thickening. Hyperostosis frontalis interna. Clear mastoid air cells.  Intracranial: Suspect a small left frontal lobe meningioma of approximately 9 mm on image 21/series 2. Grossly similar to on the prior exam. No surrounding cerebral edema or significant mass effect. Mild low density in the periventricular white matter likely related to small vessel disease. Otherwise, no mass lesion, hemorrhage, hydrocephalus, acute infarct, intra-axial, or extra-axial fluid collection.  IMPRESSION: 1. No acute intracranial abnormality. Mild small vessel ischemic change. 2. Small left frontal lobe meningioma, not significantly changed. 3. Minimal sinus disease.   Electronically Signed   By: Abigail Miyamoto M.D.   On: 04/10/2014 19:59     EKG Interpretation   Date/Time:  Monday April 10 2014 18:17:07 EDT Ventricular Rate:  61 PR Interval:  162 QRS Duration: 95 QT Interval:  405 QTC Calculation:  408 R Axis:   42 Text Interpretation:  Sinus rhythm Atrial premature complex No significant  change was found Confirmed by Wyvonnia Dusky  MD, Eulas Schweitzer [phone]) on 04/11/2014  12:06:11 PM      MDM   Final diagnoses:  Urinary tract infection   Decreased mental status with dysuria, nausea, vomiting and confusion.  Oriented to person only. Vitals stable.  Mild diffuse abdominal tenderness. Dry mucus membranes.  EKG nsr. CT head negative. CXR clear. UA with infection, rocephin started. IVF given. Cr at baseline.  Suspect encephalopathy from UTI and dehydration.  Admission d/w Dr. Posey Pronto.  CT abdomen pending at time of admission.  Addendum: Dr. Tyrell Antonio aware of pancreas mass finding on CT.  Ezequiel Essex, MD 04/11/14 1212

## 2014-04-10 NOTE — ED Notes (Addendum)
Pt's daughter, [name], who lives with her is at bedside. She reports that patient has had decreased appetite with nausea and vomiting x2-3 days. States pt has been complaining of burning on urination and increased frequency x4-5 days. Pt has increased weakness and decreased ambulation x12month. Reports slurred speech worse at night x1week.

## 2014-04-11 ENCOUNTER — Observation Stay (HOSPITAL_COMMUNITY): Payer: Medicare Other

## 2014-04-11 ENCOUNTER — Encounter (HOSPITAL_COMMUNITY): Payer: Self-pay

## 2014-04-11 DIAGNOSIS — R4781 Slurred speech: Secondary | ICD-10-CM | POA: Diagnosis present

## 2014-04-11 LAB — COMPREHENSIVE METABOLIC PANEL
ALBUMIN: 2.8 g/dL — AB (ref 3.5–5.2)
ALT: 6 U/L (ref 0–35)
AST: 10 U/L (ref 0–37)
Alkaline Phosphatase: 46 U/L (ref 39–117)
BILIRUBIN TOTAL: 0.3 mg/dL (ref 0.3–1.2)
BUN: 33 mg/dL — AB (ref 6–23)
CHLORIDE: 104 meq/L (ref 96–112)
CO2: 24 meq/L (ref 19–32)
Calcium: 8.8 mg/dL (ref 8.4–10.5)
Creatinine, Ser: 1.57 mg/dL — ABNORMAL HIGH (ref 0.50–1.10)
GFR, EST AFRICAN AMERICAN: 34 mL/min — AB (ref >90)
GFR, EST NON AFRICAN AMERICAN: 30 mL/min — AB (ref >90)
Glucose, Bld: 103 mg/dL — ABNORMAL HIGH (ref 70–99)
Potassium: 4.4 mEq/L (ref 3.7–5.3)
Sodium: 139 mEq/L (ref 137–147)
Total Protein: 5.3 g/dL — ABNORMAL LOW (ref 6.0–8.3)

## 2014-04-11 LAB — CBC
HCT: 32.3 % — ABNORMAL LOW (ref 36.0–46.0)
Hemoglobin: 10.4 g/dL — ABNORMAL LOW (ref 12.0–15.0)
MCH: 29.4 pg (ref 26.0–34.0)
MCHC: 32.2 g/dL (ref 30.0–36.0)
MCV: 91.2 fL (ref 78.0–100.0)
PLATELETS: 186 10*3/uL (ref 150–400)
RBC: 3.54 MIL/uL — AB (ref 3.87–5.11)
RDW: 13.5 % (ref 11.5–15.5)
WBC: 6.9 10*3/uL (ref 4.0–10.5)

## 2014-04-11 LAB — PROTIME-INR
INR: 1.26 (ref 0.00–1.49)
PROTHROMBIN TIME: 15.5 s — AB (ref 11.6–15.2)

## 2014-04-11 MED ORDER — OXYCODONE-ACETAMINOPHEN 5-325 MG PO TABS
1.0000 | ORAL_TABLET | Freq: Three times a day (TID) | ORAL | Status: DC | PRN
  Administered 2014-04-11 – 2014-04-14 (×6): 1 via ORAL
  Filled 2014-04-11 (×7): qty 1

## 2014-04-11 MED ORDER — OXYCODONE HCL 5 MG PO TABS
5.0000 mg | ORAL_TABLET | Freq: Three times a day (TID) | ORAL | Status: DC | PRN
  Administered 2014-04-12 – 2014-04-14 (×5): 5 mg via ORAL
  Filled 2014-04-11 (×6): qty 1

## 2014-04-11 MED ORDER — OXYCODONE-ACETAMINOPHEN 10-325 MG PO TABS: 1.0000 | ORAL_TABLET | Freq: Three times a day (TID) | ORAL | Status: DC | PRN

## 2014-04-11 NOTE — Progress Notes (Addendum)
[facility] PROGRESS NOTE  Linda [name] AYT:[id number] DOB: 09-28-1930 DOA: 04/10/2014 PCP: Leonides Sake, MD  Assessment/Plan: 1-Encephalopathy; patient presents with lethargy, increase confusion, slurred speech. Could be secondary to Infection, UTI, dehydration, also stroke in the differential. MRI brain ordered. Treat UTI, continue with IV fluids. I will change  schedule oxycodone to avoid oversedation.  2-Chronic renal failure. Cr on admission at 1.6. Prior Cr at 1.6 to a.7. Will continue with IV fluids.   3-UTI; UA with too numerous to count WBC. Continue with Ceftriaxone. Will send urine culture.   4-Abdominal pain, nausea, vomiting; CT abdomen show No acute intra-abdominal or pelvic process. Gallstones versus sludge  without CT findings of acute cholecystitis. Suspected 18 mm cystic pancreatic mass appears new, limited. Will need to discuss with daughter prior ordering  MRI . Might need RUQ [place].   5-Chronic pain; change medications to PRN to avoid over sedation.    Code Status: full code per HPI.  Family Communication: none at bedside. Try to contact daughter multiple times.  Disposition Plan: remain inpatient    Consultants:  none  Procedures:  MRI  Antibiotics:  Ceftriaxone 6-1  HPI/Subjective: Patient sleepy, she open eyes to voice. She denies abdominal pain.   Objective: Filed Vitals:   04/11/14 0955  BP: 150/73  Pulse: 60  Temp: 97.8 F (36.6 C)  Resp: 16    Intake/Output Summary (Last 24 hours) at 04/11/14 1348 Last data filed at 04/11/14 1003  Gross per 24 hour  Intake    325 ml  Output      8 ml  Net    317 ml   Filed Weights   04/10/14 2259  Weight: 104.282 kg (229 lb 14.4 oz)    Exam:   General:  No distress, sleepy but open eyes to voice.   Cardiovascular: S 1 , S 2 RRR  Respiratory: Crackles bases.  Abdomen: BS present, soft, NT  Musculoskeletal: trace edema.   Data Reviewed: Basic Metabolic Panel:  Recent Labs Lab  04/10/14 1855 04/11/14 0617  NA 141 139  K 4.7 4.4  CL 104 104  CO2 24 24  GLUCOSE 97 103*  BUN 36* 33*  CREATININE 1.73* 1.57*  CALCIUM 9.5 8.8   Liver Function Tests:  Recent Labs Lab 04/10/14 1855 04/11/14 0617  AST 13 10  ALT 7 6  ALKPHOS 49 46  BILITOT 0.3 0.3  PROT 6.2 5.3*  ALBUMIN 3.3* 2.8*   No results found for this basename: LIPASE, AMYLASE,  in the last 168 hours  Recent Labs Lab 04/10/14 1855  AMMONIA 34   CBC:  Recent Labs Lab 04/10/14 1855 04/11/14 0617  WBC 7.4 6.9  NEUTROABS 4.6  --   HGB 11.9* 10.4*  HCT 37.8 32.3*  MCV 92.6 91.2  PLT 203 186   Cardiac Enzymes:  Recent Labs Lab 04/10/14 1855  TROPONINI <0.30   BNP (last 3 results)  Recent Labs  04/10/14 1855  PROBNP 239.0   CBG:  Recent Labs Lab 04/10/14 2300  GLUCAP 80    No results found for this or any previous visit (from the past 240 hour(s)).   Studies: Ct Abdomen Pelvis Wo Contrast  04/11/2014   CLINICAL DATA:  Altered mental status. Chronic kidney failure. Lethargy. Alzheimer's. Right hip pain.  EXAM: CT ABDOMEN AND PELVIS WITHOUT CONTRAST  TECHNIQUE: Multidetector CT imaging of the abdomen and pelvis was performed following the standard protocol without IV contrast.  COMPARISON:  CT of the abdomen pelvis January 03, 2013.  FINDINGS: Included view of the lungs demonstrate mild pleural thickening dependent atelectasis. Heart size is normal, mitral annular calcifications. Pericardium is nonsuspicious.  The liver, spleen, adrenal glands are nonsuspicious. Layering tiny gallstones versus sludge in the gallbladder. Suspected 18 mm pancreatic mass within the body, axial 21/88, this was not clearly present on prior CT.  Similar mild fullness of the right renal collecting system with lobulated cortex. Atrophic left kidney with tiny lower pole calcifications. Stable 16 mm left upper pole cyst. Urinary bladder is partially distended, and otherwise unremarkable. Moderate to severe  calcific atherosclerosis of the aortoiliac vessels which are normal in course and caliber. Status post hysterectomy.  Small hiatal hernia. Stomach, small and large bowel are normal in course and caliber without inflammatory changes. The appendix is not discretely identified, however there are no inflammatory changes in the right lower quadrant. . Sigmoid diverticulosis. No intraperitoneal free fluid nor free air.  Osteopenia. Grade 1 L4-5 anterolisthesis on degenerative basis with moderate neural foraminal narrowing. Severe L5-S1 degenerative disc disease. Mild similar L1 compression fracture.  IMPRESSION: No acute intra-abdominal or pelvic process. Gallstones versus sludge without CT findings of acute cholecystitis.  Suspected 18 mm cystic pancreatic mass appears new, limited assessment on noncontrast examination. Findings would be better characterized on MRI pancreatic mass protocol, as clinically indicated.  Similar severe left renal atrophy/scarring with nonobstructing left lower pole calculi.   Electronically Signed   By: Elon Alas   On: 04/11/2014 01:45   Dg Chest 2 View  04/10/2014   CLINICAL DATA:  Altered mental status, fall  EXAM: CHEST  2 VIEW  COMPARISON:  Prior chest x-ray 06/11/2013  FINDINGS: Stable cardiac and mediastinal contours. Atherosclerotic calcification present in the transverse aorta. Surgical changes of prior right shoulder arthroplasty. No evidence of hardware complication in the visualized portion. No pneumothorax, pleural effusion or focal airspace consolidation. Stable bronchitic changes and interstitial prominence. Incompletely imaged anterior cervical fusion hardware at C6-C7. No acute fracture. Multilevel degenerative spurring throughout the thoracic spine.  IMPRESSION: Stable chest x-ray without evidence of acute cardiopulmonary process or fracture.   Electronically Signed   By: Jacqulynn Cadet M.D.   On: 04/10/2014 20:48   Dg Hip Complete Right  04/10/2014   CLINICAL  DATA:  Fall.  Right hip pain.  EXAM: RIGHT HIP - COMPLETE 2+ VIEW  COMPARISON:  03/28/2013  FINDINGS: No fracture or bone lesion.  There is mild narrowing of the superior lateral hip joint spaces bilaterally with bony prominence from the superior acetabula and mild superior lateral subchondral acetabular cystic change. This is stable. There are bony productive changes from the greater trochanters bilaterally.  Bones are diffusely demineralized. Bony pelvis is intact. SI joints and symphysis pubis are normally spaced and aligned.  Soft tissues are unremarkable.  IMPRESSION: No fracture or acute finding.  No change from the prior study.   Electronically Signed   By: Lajean Manes M.D.   On: 04/10/2014 20:50   Ct Head Wo Contrast  04/10/2014   CLINICAL DATA:  Altered mental status and failure to thrive. Dementia.  EXAM: CT HEAD WITHOUT CONTRAST  TECHNIQUE: Contiguous axial images were obtained from the base of the skull through the vertex without intravenous contrast.  COMPARISON:  06/11/2013  FINDINGS: Sinuses/Soft tissues: Minimal ethmoid air cell mucosal thickening. Hyperostosis frontalis interna. Clear mastoid air cells.  Intracranial: Suspect a small left frontal lobe meningioma of approximately 9 mm on image 21/series 2. Grossly similar to on the prior exam.  No surrounding cerebral edema or significant mass effect. Mild low density in the periventricular white matter likely related to small vessel disease. Otherwise, no mass lesion, hemorrhage, hydrocephalus, acute infarct, intra-axial, or extra-axial fluid collection.  IMPRESSION: 1. No acute intracranial abnormality. Mild small vessel ischemic change. 2. Small left frontal lobe meningioma, not significantly changed. 3. Minimal sinus disease.   Electronically Signed   By: Abigail Miyamoto M.D.   On: 04/10/2014 19:59    Scheduled Meds: . cefTRIAXone (ROCEPHIN)  IV  1 g Intravenous Q24H  . donepezil  20 mg Oral QHS  . escitalopram  20 mg Oral Daily  . heparin   5,000 Units Subcutaneous 3 times per day  . oxyCODONE-acetaminophen  1 tablet Oral 3 times per day   And  . oxyCODONE  5 mg Oral 3 times per day  . risperiDONE  1 mg Oral BID  . sodium chloride  3 mL Intravenous Q12H  . traZODone  100 mg Oral QHS   Continuous Infusions: . sodium chloride 75 mL/hr at 04/11/14 1313    Principal Problem:   Acute encephalopathy Active Problems:   DIABETES MELLITUS, TYPE II   HYPERTENSION   Anemia   Dementia   Acute on chronic renal failure   Urinary tract infection   Slurred speech    Time spent: 35 minutes.     [facility] Pager [phone]. If 7PM-7AM, please contact night-coverage at [email or web address], password [facility] 04/11/2014, 1:48 PM  LOS: 1 day

## 2014-04-11 NOTE — Progress Notes (Signed)
UR completed. Patient changed to inpatient- requiring IV antibiotics and IVF

## 2014-04-11 NOTE — Progress Notes (Signed)
New Admission:  Arrival Method: stretcher  Mental Orientation:   Alert to self place, situation  Telemetry: placed SB Assessment: completed  Skin: bruising, Redness and purple discoloration to buttock, stated from old healed DTI and pressure ulcer per daughter. Area is blanchable Foam placed at pt put on q2 hr turns  IV: infusing  Pain: denies  Tubes: na  Safety Measures: initiated Admission: completed  [place] Orientation: completed  Family: at bedside   Orders have been reviewed and implemented. Will continue to monitor the patient. Call light has been placed within reach and bed alarm has been activated.  [employer] BSN, RN-BC, [place] [id number]

## 2014-04-12 ENCOUNTER — Inpatient Hospital Stay (HOSPITAL_COMMUNITY): Payer: Medicare Other

## 2014-04-12 DIAGNOSIS — N390 Urinary tract infection, site not specified: Principal | ICD-10-CM

## 2014-04-12 DIAGNOSIS — G9340 Encephalopathy, unspecified: Secondary | ICD-10-CM

## 2014-04-12 DIAGNOSIS — N179 Acute kidney failure, unspecified: Secondary | ICD-10-CM

## 2014-04-12 DIAGNOSIS — N189 Chronic kidney disease, unspecified: Secondary | ICD-10-CM

## 2014-04-12 DIAGNOSIS — K869 Disease of pancreas, unspecified: Secondary | ICD-10-CM

## 2014-04-12 LAB — BASIC METABOLIC PANEL
BUN: 22 mg/dL (ref 6–23)
CALCIUM: 8.9 mg/dL (ref 8.4–10.5)
CO2: 24 mEq/L (ref 19–32)
Chloride: 106 mEq/L (ref 96–112)
Creatinine, Ser: 1.15 mg/dL — ABNORMAL HIGH (ref 0.50–1.10)
GFR calc Af Amer: 50 mL/min — ABNORMAL LOW (ref >90)
GFR calc non Af Amer: 43 mL/min — ABNORMAL LOW (ref >90)
GLUCOSE: 94 mg/dL (ref 70–99)
Potassium: 4.5 mEq/L (ref 3.7–5.3)
Sodium: 141 mEq/L (ref 137–147)

## 2014-04-12 LAB — CBC
HEMATOCRIT: 35.1 % — AB (ref 36.0–46.0)
HEMOGLOBIN: 11.3 g/dL — AB (ref 12.0–15.0)
MCH: 29.2 pg (ref 26.0–34.0)
MCHC: 32.2 g/dL (ref 30.0–36.0)
MCV: 90.7 fL (ref 78.0–100.0)
Platelets: 204 10*3/uL (ref 150–400)
RBC: 3.87 MIL/uL (ref 3.87–5.11)
RDW: 13.6 % (ref 11.5–15.5)
WBC: 7 10*3/uL (ref 4.0–10.5)

## 2014-04-12 LAB — URINE CULTURE
COLONY COUNT: NO GROWTH
Culture: NO GROWTH

## 2014-04-12 NOTE — Progress Notes (Addendum)
[facility] PROGRESS NOTE  MAYUKHA SYMMONDS WJX:[id number] DOB: 17-Mar-1930 DOA: 04/10/2014  PCP: Leonides Sake, MD  Brief HPI: Linda [name] is a 80 y.o. female with Past medical history of diabetes mellitus, hypertension, TIA, chronic kidney disease, dementia.  The patient was brought in by daughter. The patient presented with complaints of confusion, increasing urinary frequency and complaints of burning urination.   Past medical history:  Past Medical History  Diagnosis Date  . Allergy     allergic rhinitis  . Diabetes mellitus     type II  . Hypertension   . Osteopenia   . TIA (transient ischemic attack)   . Contact dermatitis     on buttocks  . Nosebleed   . Anxiety   . Renal insufficiency   . Pyelonephritis   . Dementia   . Chiari malformation     Consultants: None  Procedures: None  Antibiotics: Ceftriaxone  Subjective: Patient denies pain. Appears slightly confused. Denies nausea or vomiting.  Objective: Vital Signs  Filed Vitals:   04/11/14 0955 04/11/14 1759 04/11/14 1934 04/12/14 0434  BP: 150/73 133/84 124/72 170/73  Pulse: 60 49 63 65  Temp: 97.8 F (36.6 C) 98.2 F (36.8 C) 98.2 F (36.8 C) 98.2 F (36.8 C)  TempSrc: Oral Oral    Resp: 16 17 17 18   Height:      Weight:   106.323 kg (234 lb 6.4 oz)   SpO2: 94% 98% 97% 95%    Intake/Output Summary (Last 24 hours) at 04/12/14 0802 Last data filed at 04/12/14 0656  Gross per 24 hour  Intake 1182.5 ml  Output      0 ml  Net 1182.5 ml   Filed Weights   04/10/14 2259 04/11/14 1934  Weight: 104.282 kg (229 lb 14.4 oz) 106.323 kg (234 lb 6.4 oz)   General appearance: alert, appears stated age, distracted and no distress Head: Normocephalic, without obvious abnormality, atraumatic Eyes: conjunctivae/corneas clear. PERRL, EOM's intact. Resp: clear to auscultation bilaterally Cardio: regular rate and rhythm, S1, S2 normal, no murmur, click, rub or gallop GI: soft, non-tender; bowel  sounds normal; no masses,  no organomegaly Extremities: extremities normal, atraumatic, no cyanosis or edema Pulses: 2+ and symmetric Neurologic: No obvious facial droop. No focal deficits identified. Overall generalized weakness noted.  Lab Results:  Basic Metabolic Panel:  Recent Labs Lab 04/10/14 1855 04/11/14 0617 04/12/14 0534  NA 141 139 141  K 4.7 4.4 4.5  CL 104 104 106  CO2 24 24 24   GLUCOSE 97 103* 94  BUN 36* 33* 22  CREATININE 1.73* 1.57* 1.15*  CALCIUM 9.5 8.8 8.9   Liver Function Tests:  Recent Labs Lab 04/10/14 1855 04/11/14 0617  AST 13 10  ALT 7 6  ALKPHOS 49 46  BILITOT 0.3 0.3  PROT 6.2 5.3*  ALBUMIN 3.3* 2.8*    Recent Labs Lab 04/10/14 1855  AMMONIA 34   CBC:  Recent Labs Lab 04/10/14 1855 04/11/14 0617 04/12/14 0534  WBC 7.4 6.9 7.0  NEUTROABS 4.6  --   --   HGB 11.9* 10.4* 11.3*  HCT 37.8 32.3* 35.1*  MCV 92.6 91.2 90.7  PLT 203 186 204   Cardiac Enzymes:  Recent Labs Lab 04/10/14 1855  TROPONINI <0.30   BNP (last 3 results)  Recent Labs  04/10/14 1855  PROBNP 239.0   CBG:  Recent Labs Lab 04/10/14 2300  GLUCAP 80     Studies/Results: Ct Abdomen Pelvis Wo Contrast  04/11/2014  CLINICAL DATA:  Altered mental status. Chronic kidney failure. Lethargy. Alzheimer's. Right hip pain.  EXAM: CT ABDOMEN AND PELVIS WITHOUT CONTRAST  TECHNIQUE: Multidetector CT imaging of the abdomen and pelvis was performed following the standard protocol without IV contrast.  COMPARISON:  CT of the abdomen pelvis January 03, 2013.  FINDINGS: Included view of the lungs demonstrate mild pleural thickening dependent atelectasis. Heart size is normal, mitral annular calcifications. Pericardium is nonsuspicious.  The liver, spleen, adrenal glands are nonsuspicious. Layering tiny gallstones versus sludge in the gallbladder. Suspected 18 mm pancreatic mass within the body, axial 21/88, this was not clearly present on prior CT.  Similar mild  fullness of the right renal collecting system with lobulated cortex. Atrophic left kidney with tiny lower pole calcifications. Stable 16 mm left upper pole cyst. Urinary bladder is partially distended, and otherwise unremarkable. Moderate to severe calcific atherosclerosis of the aortoiliac vessels which are normal in course and caliber. Status post hysterectomy.  Small hiatal hernia. Stomach, small and large bowel are normal in course and caliber without inflammatory changes. The appendix is not discretely identified, however there are no inflammatory changes in the right lower quadrant. . Sigmoid diverticulosis. No intraperitoneal free fluid nor free air.  Osteopenia. Grade 1 L4-5 anterolisthesis on degenerative basis with moderate neural foraminal narrowing. Severe L5-S1 degenerative disc disease. Mild similar L1 compression fracture.  IMPRESSION: No acute intra-abdominal or pelvic process. Gallstones versus sludge without CT findings of acute cholecystitis.  Suspected 18 mm cystic pancreatic mass appears new, limited assessment on noncontrast examination. Findings would be better characterized on MRI pancreatic mass protocol, as clinically indicated.  Similar severe left renal atrophy/scarring with nonobstructing left lower pole calculi.   Electronically Signed   By: Elon Alas   On: 04/11/2014 01:45   Dg Chest 2 View  04/10/2014   CLINICAL DATA:  Altered mental status, fall  EXAM: CHEST  2 VIEW  COMPARISON:  Prior chest x-ray 06/11/2013  FINDINGS: Stable cardiac and mediastinal contours. Atherosclerotic calcification present in the transverse aorta. Surgical changes of prior right shoulder arthroplasty. No evidence of hardware complication in the visualized portion. No pneumothorax, pleural effusion or focal airspace consolidation. Stable bronchitic changes and interstitial prominence. Incompletely imaged anterior cervical fusion hardware at C6-C7. No acute fracture. Multilevel degenerative spurring  throughout the thoracic spine.  IMPRESSION: Stable chest x-ray without evidence of acute cardiopulmonary process or fracture.   Electronically Signed   By: Jacqulynn Cadet M.D.   On: 04/10/2014 20:48   Dg Hip Complete Right  04/10/2014   CLINICAL DATA:  Fall.  Right hip pain.  EXAM: RIGHT HIP - COMPLETE 2+ VIEW  COMPARISON:  03/28/2013  FINDINGS: No fracture or bone lesion.  There is mild narrowing of the superior lateral hip joint spaces bilaterally with bony prominence from the superior acetabula and mild superior lateral subchondral acetabular cystic change. This is stable. There are bony productive changes from the greater trochanters bilaterally.  Bones are diffusely demineralized. Bony pelvis is intact. SI joints and symphysis pubis are normally spaced and aligned.  Soft tissues are unremarkable.  IMPRESSION: No fracture or acute finding.  No change from the prior study.   Electronically Signed   By: Lajean Manes M.D.   On: 04/10/2014 20:50   Ct Head Wo Contrast  04/10/2014   CLINICAL DATA:  Altered mental status and failure to thrive. Dementia.  EXAM: CT HEAD WITHOUT CONTRAST  TECHNIQUE: Contiguous axial images were obtained from the base of the skull through  the vertex without intravenous contrast.  COMPARISON:  06/11/2013  FINDINGS: Sinuses/Soft tissues: Minimal ethmoid air cell mucosal thickening. Hyperostosis frontalis interna. Clear mastoid air cells.  Intracranial: Suspect a small left frontal lobe meningioma of approximately 9 mm on image 21/series 2. Grossly similar to on the prior exam. No surrounding cerebral edema or significant mass effect. Mild low density in the periventricular white matter likely related to small vessel disease. Otherwise, no mass lesion, hemorrhage, hydrocephalus, acute infarct, intra-axial, or extra-axial fluid collection.  IMPRESSION: 1. No acute intracranial abnormality. Mild small vessel ischemic change. 2. Small left frontal lobe meningioma, not significantly  changed. 3. Minimal sinus disease.   Electronically Signed   By: Abigail Miyamoto M.D.   On: 04/10/2014 19:59    Medications:  Scheduled: . cefTRIAXone (ROCEPHIN)  IV  1 g Intravenous Q24H  . donepezil  20 mg Oral QHS  . escitalopram  20 mg Oral Daily  . heparin  5,000 Units Subcutaneous 3 times per day  . risperiDONE  1 mg Oral BID  . sodium chloride  3 mL Intravenous Q12H  . traZODone  100 mg Oral QHS   Continuous: . sodium chloride 75 mL/hr at 04/12/14 1610   RUE:[id number], acetaminophen, ondansetron (ZOFRAN) IV, ondansetron, oxyCODONE, oxyCODONE-acetaminophen  Assessment/Plan:  Principal Problem:   Acute encephalopathy Active Problems:   DIABETES MELLITUS, TYPE II   HYPERTENSION   Anemia   Dementia   Acute on chronic renal failure   Urinary tract infection   Slurred speech    Acute Encephalopathy  Possibly due to UTI. MRI brain is pending. No focal deficits on examination.   Acute on Chronic Renal Failure Stage 4 Cr on admission at 1.6. Prior Cr at 1.6 to 1.7. Seems to be improving. Cut back on IVF. Monitor UO.  UTI UA with too numerous to count WBC. Continue with Ceftriaxone. Await urine culture.   Pancreatic Lesion/Abdominal pain, nausea, vomiting CT abdomen show no acute intra-abdominal or pelvic process. Gallstones versus sludge without CT findings of acute cholecystitis. Suspected 18 mm cystic pancreatic mass appears new. MRI/MRCP ordered. LFT's are normal. Abdomen is soft and benign.   Chronic pain Medications changed to PRN to avoid over sedation.   Code Status: Full Code  DVT Prophylaxis: Heparin    Family Communication: Discussed with daughter, [name] at [phone]. Patient not under hospice care and daughter doesn't want her under hospice care as of now. Disposition Plan: Not ready for discharge.    LOS: 2 days   [facility] Pager [phone] 04/12/2014, 8:02 AM  If 8PM-8AM, please contact night-coverage at [email or web address],  password [email or web address]   Disclaimer: This note was dictated with voice recognition software. Similar sounding words can inadvertently be transcribed and may not be corrected upon review.

## 2014-04-12 NOTE — Clinical Documentation Improvement (Signed)
  Please clarify "CKD" with suspected/likely/possible stage to reflect severity of illness and risk of mortality. Thank you.  Possible Clinical Conditions?  - CKD Stage I - GFR > OR = 90 - CKD Stage II - GFR 60-80 - CKD Stage III - GFR 30-59 - CKD Stage IV - GFR 15-29 - CKD Stage V - GFR < 15 - ESRD (End Stage Renal Disease) - Other condition (please specify)   Supporting Information: - GFR: 6/1 26/30,  6/2 30/34,  6/3 43/50 - BUN/Cr: 6/1 36/1.73,  6/2 33/1.57,  6/3 22/1.15  Thank You, Ezekiel Ina ,RN Clinical Documentation Specialist:  [place] [facility]

## 2014-04-13 ENCOUNTER — Inpatient Hospital Stay (HOSPITAL_COMMUNITY): Payer: Medicare Other

## 2014-04-13 DIAGNOSIS — F0390 Unspecified dementia without behavioral disturbance: Secondary | ICD-10-CM

## 2014-04-13 LAB — CBC
HEMATOCRIT: 36.4 % (ref 36.0–46.0)
Hemoglobin: 12.3 g/dL (ref 12.0–15.0)
MCH: 29.9 pg (ref 26.0–34.0)
MCHC: 33.8 g/dL (ref 30.0–36.0)
MCV: 88.6 fL (ref 78.0–100.0)
Platelets: 219 10*3/uL (ref 150–400)
RBC: 4.11 MIL/uL (ref 3.87–5.11)
RDW: 13.5 % (ref 11.5–15.5)
WBC: 8 10*3/uL (ref 4.0–10.5)

## 2014-04-13 LAB — BASIC METABOLIC PANEL
BUN: 11 mg/dL (ref 6–23)
CO2: 26 mEq/L (ref 19–32)
Calcium: 9.7 mg/dL (ref 8.4–10.5)
Chloride: 104 mEq/L (ref 96–112)
Creatinine, Ser: 0.94 mg/dL (ref 0.50–1.10)
GFR calc Af Amer: 64 mL/min — ABNORMAL LOW (ref >90)
GFR, EST NON AFRICAN AMERICAN: 55 mL/min — AB (ref >90)
GLUCOSE: 116 mg/dL — AB (ref 70–99)
Potassium: 4.3 mEq/L (ref 3.7–5.3)
SODIUM: 142 meq/L (ref 137–147)

## 2014-04-13 MED ORDER — GLUCERNA SHAKE PO LIQD
237.0000 mL | Freq: Three times a day (TID) | ORAL | Status: DC
  Administered 2014-04-13 – 2014-04-14 (×2): 237 mL via ORAL

## 2014-04-13 NOTE — Evaluation (Signed)
Physical Therapy Evaluation Patient Details Name: Linda [name] MRN: [id number] DOB: Jan 06, 1930 Today's Date: 04/13/2014   History of Present Illness  80 y.o. female with Past medical history of diabetes mellitus, hypertension, TIA, chronic kidney disease, dementia.  The patient was brought in by daughter. The patient presented with complaints of confusion, increasing urinary frequency and complaints of burning urination.   Clinical Impression  Pt adm from [facility] (per chart) due to the above. Presents with significant decline in functional mobility secondary to deficits indicated below. Pt required 2 person total (A) for SPT to chair this session. Pt to benefit from skilled acute PT to address deficits indicated below and maximize functional mobility. Attempted to phone daughter, no family availble to determine PLOF and (A) provided upon D/C.At this time will recommend SNF for post acute rehab due to increased (A) needed for basic transfers.   Follow Up Recommendations SNF;Supervision/Assistance - 24 hour    Equipment Recommendations  Other (comment);Hospital bed (if D/C home may need hoyer lift and bed )    Recommendations for Other Services OT consult     Precautions / Restrictions Precautions Precautions: Fall Restrictions Weight Bearing Restrictions: No      Mobility  Bed Mobility Overal bed mobility: Needs Assistance Bed Mobility: Supine to Sit     Supine to sit: Max assist;HOB elevated     General bed mobility comments: pt with difficulty following commands and motor sequencing; requries max (A) to bring LEs and trunk to sitting position   Transfers Overall transfer level: Needs assistance Equipment used: 2 person hand held assist Transfers: Sit to/from [facility] Sit to Stand: Total assist Stand pivot transfers: +2 physical assistance;Max assist       General transfer comment: performed sit to stand x 2 with total (A) of 1 person; unable to safely  perform pivotal steps to chair without 2nd person (A); pt with difficulty following commands and LEs began shaking with movements  Ambulation/Gait             General Gait Details: pt unable to safely ambulate at this time   Stairs            Wheelchair Mobility    Modified Rankin (Stroke Patients Only)       Balance Overall balance assessment: Needs assistance Sitting-balance support: Feet supported;Bilateral upper extremity supported Sitting balance-Leahy Scale: Poor Sitting balance - Comments: pt with multiple posterior balance disturbances; requires max cues for upright posture and min facilitation  Postural control: Posterior lean Standing balance support: During functional activity;Bilateral upper extremity supported Standing balance-Leahy Scale: Zero Standing balance comment: pt leaning posteriorly throughout standing and requires total (A) to maintain balance                              Pertinent Vitals/Pain No c/o pain during session     Home Living Family/patient expects to be discharged to:: Private residence Living Arrangements: Children Available Help at Discharge: Family;Available 24 hours/day Type of Home: House Home Access: Ramped entrance     Home Layout: Able to live on main level with bedroom/bathroom Home Equipment: Walker - 2 wheels;Cane - single point;Bedside commode      Prior Function Level of Independence: Independent with assistive device(s)         Comments: PLOF obtained from chart; per chart pt was independent for ADLs and RN reports pt has aide and 24/7 (A); no family present and attempted to  phone pt daughter with no answer      Hand Dominance        Extremity/Trunk Assessment   Upper Extremity Assessment: Defer to OT evaluation           Lower Extremity Assessment: Difficult to assess due to impaired cognition      Cervical / Trunk Assessment: Kyphotic  Communication   Communication: HOH   Cognition Arousal/Alertness: Awake/alert Behavior During Therapy: WFL for tasks assessed/performed Overall Cognitive Status: History of cognitive impairments - at baseline (dx Dementia ) Area of Impairment: Orientation;Following commands;Safety/judgement;Awareness;Problem solving Orientation Level: Disoriented to;Situation;Time;Place   Memory: Decreased short-term memory Following Commands: Follows one step commands inconsistently Safety/Judgement: Decreased awareness of deficits;Decreased awareness of safety Awareness: Intellectual Problem Solving: Slow processing;Decreased initiation;Requires tactile cues;Requires verbal cues;Difficulty sequencing General Comments: no family present to determine baseline     General Comments General comments (skin integrity, edema, etc.): per NT daughter reports pt was ambulatory prior to admission; NT assisted with changing gown due to incontinence      Exercises        Assessment/Plan    PT Assessment Patient needs continued PT services  PT Diagnosis Difficulty walking;Generalized weakness;Acute pain;Altered mental status   PT Problem List Decreased strength;Decreased activity tolerance;Decreased balance;Decreased mobility;Decreased cognition;Decreased knowledge of use of DME;Decreased safety awareness;Decreased knowledge of precautions;Pain  PT Treatment Interventions DME instruction;Gait training;Functional mobility training;Therapeutic activities;Therapeutic exercise;Balance training;Neuromuscular re-education;Cognitive remediation;Patient/family education;Wheelchair mobility training   PT Goals (Current goals can be found in the Care Plan section) Acute Rehab PT Goals Patient Stated Goal: none stated  PT Goal Formulation: Patient unable to participate in goal setting Time For Goal Achievement: 04/20/14 Potential to Achieve Goals: Fair    Frequency Min 2X/week   Barriers to discharge   unable to reach family to determine (A) available      Co-evaluation               End of Session Equipment Utilized During Treatment: Gait belt Activity Tolerance: Patient limited by fatigue Patient left: in chair;with call bell/phone within reach;with chair alarm set;with nursing/sitter in room Nurse Communication: Mobility status;Need for lift equipment;Precautions         Time: 8546-2703 PT Time Calculation (min): 20 min   Charges:   PT Evaluation $Initial PT Evaluation Tier I: 1 Procedure PT Treatments $Therapeutic Activity: 8-22 mins   PT G CodesKennis Carina [place] , [place]  [phone]  04/13/2014, 10:19 AM

## 2014-04-13 NOTE — Progress Notes (Signed)
[facility] PROGRESS NOTE  DANNIEL GRENZ HWE:[id number] DOB: [birth year] DOA: 04/10/2014  PCP: Leonides Sake, MD  Brief HPI: Linda [name] is a 80 y.o. female with Past medical history of diabetes mellitus, hypertension, TIA, chronic kidney disease, dementia.  The patient was brought in by daughter. The patient presented with complaints of confusion, increasing urinary frequency and complaints of burning urination.   Past medical history:  Past Medical History  Diagnosis Date  . Allergy     allergic rhinitis  . Diabetes mellitus     type II  . Hypertension   . Osteopenia   . TIA (transient ischemic attack)   . Contact dermatitis     on buttocks  . Nosebleed   . Anxiety   . Renal insufficiency   . Pyelonephritis   . Dementia   . Chiari malformation     Consultants: None  Procedures: None  Antibiotics: Ceftriaxone  Subjective: Patient is confused. Denies nausea or vomiting.  Objective: Vital Signs  Filed Vitals:   04/12/14 0434 04/12/14 0946 04/12/14 2100 04/13/14 0500  BP: 170/73 143/78 183/76 149/82  Pulse: 65 52 72 69  Temp: 98.2 F (36.8 C) 98.3 F (36.8 C) 98.4 F (36.9 C) 98.4 F (36.9 C)  TempSrc:  Oral Oral Oral  Resp: 18 16 18 18   Height:      Weight:   106.323 kg (234 lb 6.4 oz)   SpO2: 95% 93% 98% 94%   No intake or output data in the 24 hours ending 04/13/14 1003 Filed Weights   04/10/14 2259 04/11/14 1934 04/12/14 2100  Weight: 104.282 kg (229 lb 14.4 oz) 106.323 kg (234 lb 6.4 oz) 106.323 kg (234 lb 6.4 oz)   General appearance: alert, appears stated age, distracted and no distress Resp: clear to auscultation bilaterally Cardio: regular rate and rhythm, S1, S2 normal, no murmur, click, rub or gallop GI: soft, non-tender; bowel sounds normal; no masses,  no organomegaly Extremities: extremities normal, atraumatic, no cyanosis or edema Pulses: 2+ and symmetric Neurologic: No obvious facial droop. No focal deficits identified.  Overall generalized weakness noted.  Lab Results:  Basic Metabolic Panel:  Recent Labs Lab 04/10/14 1855 04/11/14 0617 04/12/14 0534  NA 141 139 141  K 4.7 4.4 4.5  CL 104 104 106  CO2 24 24 24   GLUCOSE 97 103* 94  BUN 36* 33* 22  CREATININE 1.73* 1.57* 1.15*  CALCIUM 9.5 8.8 8.9   Liver Function Tests:  Recent Labs Lab 04/10/14 1855 04/11/14 0617  AST 13 10  ALT 7 6  ALKPHOS 49 46  BILITOT 0.3 0.3  PROT 6.2 5.3*  ALBUMIN 3.3* 2.8*    Recent Labs Lab 04/10/14 1855  AMMONIA 34   CBC:  Recent Labs Lab 04/10/14 1855 04/11/14 0617 04/12/14 0534  WBC 7.4 6.9 7.0  NEUTROABS 4.6  --   --   HGB 11.9* 10.4* 11.3*  HCT 37.8 32.3* 35.1*  MCV 92.6 91.2 90.7  PLT 203 186 204   Cardiac Enzymes:  Recent Labs Lab 04/10/14 1855  TROPONINI <0.30   BNP (last 3 results)  Recent Labs  04/10/14 1855  PROBNP 239.0   CBG:  Recent Labs Lab 04/10/14 2300  GLUCAP 80     Studies/Results: Mr Brain Wo Contrast  04/12/2014   CLINICAL DATA:  80 year old female with slurred speech. TIA. Diabetes and hypertension. Initial encounter.  EXAM: MRI HEAD WITHOUT CONTRAST  TECHNIQUE: Multiplanar, multiecho pulse sequences of the brain and surrounding structures were  obtained without intravenous contrast.  COMPARISON:  Non contrast head CT 04/10/2014 and earlier.  FINDINGS: Cerebral volume is within normal limits for age. No restricted diffusion to suggest acute infarction. No midline shift, mass effect, ventriculomegaly, extra-axial collection or acute intracranial hemorrhage. Cervicomedullary junction and pituitary are within normal limits. Negative for age visualized cervical spine. Major intracranial vascular flow voids are preserved.  A 19 mm T2 dark extra-axial area along the left convexity which was partially calcified on the most recent CT is stable in size. This does not demonstrate increased diffusion typical of of small meningioma. There is no significant mass effect. No  associated cerebral edema.  Mild for age scattered nonspecific cerebral white matter T2 and FLAIR hyperintense areas. Mild to moderate involvement of the central pons. No cortical encephalomalacia identified. Occasional chronic micro hemorrhages in the brain (right periatrial white matter series 8, image 113).  Visible internal auditory structures appear normal. Mastoids are clear. Mild paranasal sinus mucosal thickening. Postoperative changes to the globes. Visualized scalp soft tissues are within normal limits. Normal bone marrow signal.  IMPRESSION: 1.  No acute intracranial abnormality. 2. Small left convexity lesion (identified on prior CTs) may be more likely an incidental dural calcification rather than a small meningioma. No associated mass effect or edema. 3. Mild for age chronic small vessel disease.   Electronically Signed   By: Lars Pinks M.D.   On: 04/12/2014 18:37   Mr Abdomen Mrcp Wo Cm  04/13/2014   CLINICAL DATA:  Possible pancreatic lesion on CT, dementia  EXAM: MRI ABDOMEN WITHOUT  (INCLUDING MRCP)  TECHNIQUE: Multiplanar multisequence MR imaging of the abdomen was performed. Heavily T2-weighted images of the biliary and pancreatic ducts were obtained, and three-dimensional MRCP images were rendered by post processing.  COMPARISON:  CT abdomen pelvis dated 04/11/2014  FINDINGS: Motion degraded images.  Liver is grossly unremarkable.  Spleen and adrenal glands are within normal limits.  Pancreas is notable for a 1.7 cm cystic lesion in the pancreatic body (series 4/ image 23). No solid component. No definite ductal communication. No associated pancreatic ductal dilatation. No pancreatic atrophy.  Two adjacent tiny cystic lesions measuring 4-5 mm in the pancreatic body (series 4/images 23 and 26).  Gallbladder is notable for layering sludge and/or small stones (series 4/image 30). No intrahepatic or extrahepatic ductal dilatation.  Left renal atrophy. Multiple left renal cysts measuring up to 1.5  cm in the posterior left upper kidney (series 4/ image 32). Right kidney is grossly unremarkable. No hydronephrosis.  No abdominal ascites.  No suspicious abdominal lymphadenopathy.  No focal osseous lesions.  IMPRESSION: Motion degraded images.  1.7 cm cystic lesion in the pancreatic body, likely reflecting a benign pseudocyst or IPMN, less likely an oligocystic serous cystadenoma. No malignant features on unenhanced MRI.  If this patient would be considered a surgical candidate, a single follow-up MRI abdomen with/without half-dose contrast could be performed in 12 months, as clinically warranted.  This recommendation follows ACR consensus guidelines: Managing Incidental Findings on Abdominal CT: White Paper of the ACR Incidental Findings Committee. [facility] [phone]   Electronically Signed   By: Julian Hy M.D.   On: 04/13/2014 08:35    Medications:  Scheduled: . cefTRIAXone (ROCEPHIN)  IV  1 g Intravenous Q24H  . donepezil  20 mg Oral QHS  . escitalopram  20 mg Oral Daily  . heparin  5,000 Units Subcutaneous 3 times per day  . risperiDONE  1 mg Oral BID  .  sodium chloride  3 mL Intravenous Q12H  . traZODone  100 mg Oral QHS   Continuous: . sodium chloride 50 mL/hr at 04/12/14 1033   HEN:IDPOEUMPNTIRW, acetaminophen, ondansetron (ZOFRAN) IV, ondansetron, oxyCODONE, oxyCODONE-acetaminophen  Assessment/Plan:  Principal Problem:   Acute encephalopathy Active Problems:   DIABETES MELLITUS, TYPE II   HYPERTENSION   Anemia   Dementia   Acute on chronic renal failure   Urinary tract infection   Slurred speech    Acute Encephalopathy  Possibly due to UTI. MRI brain is negative for stroke. No focal deficits on examination. Underlying dementia playing a role.  Acute on Chronic Renal Failure Stage 4 Improved with IVF. Labs pending today.  Questionable UTI UA with too numerous to count WBC. Urine culture however did not show any growth. On Ceftriaxone. Will not  continue when discharged.  Pancreatic Lesion/Abdominal pain, nausea, vomiting CT abdomen show no acute intra-abdominal or pelvic process. Gallstones versus sludge without CT findings of acute cholecystitis. Suspected 18 mm cystic pancreatic mass appears new. MRI/MRCP report reviewed. Possible benign lesion. No further workup at this time. LFT's are normal. Abdomen is soft and benign.   Chronic pain Medications changed to PRN to avoid over sedation.   Dementia On Aricept  Code Status: Full Code  DVT Prophylaxis: Heparin    Family Communication: Discussed with daughter, [name] at [phone] on 6/3. Will call again today. Disposition Plan: PT/OT to see. Anticipate discharge 6/5.    LOS: 3 days   [facility] Pager [phone] 04/13/2014, 10:03 AM  If 8PM-8AM, please contact night-coverage at [email or web address], password [email or web address]   Disclaimer: This note was dictated with voice recognition software. Similar sounding words can inadvertently be transcribed and may not be corrected upon review.

## 2014-04-13 NOTE — Progress Notes (Signed)
INITIAL NUTRITION ASSESSMENT  DOCUMENTATION CODES Per approved criteria  -Obesity Unspecified   INTERVENTION: Glucerna Shake po TID, each supplement provides 220 kcal and 10 grams of protein  NUTRITION DIAGNOSIS: Inadequate oral intake related to confusion as evidenced by reported intake less than estimated needs and suspected weight loss.   Goal: Pt to meet >/= 90% of their estimated nutrition needs   Monitor:  Weight trends, labs, acceptance of supplements, po intake  Reason for Assessment: Low Braden  80 y.o. female  Admitting Dx: Acute encephalopathy  ASSESSMENT: 80 y.o. female with Past medical history of diabetes mellitus, hypertension, TIA, chronic kidney disease, dementia.  The patient was brought in by daughter. The patient presented with complaints of confusion, increasing urinary frequency and complaints of burning urination.   Per pt's family, pt has not been eating well. They report that she will only take small bites at a time. Pt's daughter was contacted via phone and reported that pt's usual body weight is 236 lbs.   Height: Ht Readings from Last 1 Encounters:  04/10/14 5\' 5"  (1.651 m)    Weight: Wt Readings from Last 1 Encounters:  04/12/14 234 lb 6.4 oz (106.323 kg)    Ideal Body Weight: 57 kg  % Ideal Body Weight: 186%  Wt Readings from Last 10 Encounters:  04/12/14 234 lb 6.4 oz (106.323 kg)  06/11/13 231 lb 8 oz (105.008 kg)  08/28/12 233 lb 11 oz (106 kg)  08/28/12 233 lb 11 oz (106 kg)  06/22/12 239 lb 12.8 oz (108.773 kg)  12/12/11 230 lb (104.327 kg)  09/24/11 242 lb 12 oz (110.111 kg)  09/16/11 237 lb 12 oz (107.843 kg)  07/16/11 229 lb 12 oz (104.214 kg)  06/13/11 232 lb (105.235 kg)    Usual Body Weight: 236 lbs  % Usual Body Weight: 99%  BMI:  Body mass index is 39.01 kg/(m^2).  Estimated Nutritional Needs: Kcal: 1700-2000 Protein: 110-120 g Fluid: ~2.0 L/day  Skin: WNL  Diet Order: Cardiac  EDUCATION NEEDS: -No  education needs identified at this time  No intake or output data in the 24 hours ending 04/13/14 1540  Last BM: 6/3   Labs:   Recent Labs Lab 04/11/14 0617 04/12/14 0534 04/13/14 1120  NA 139 141 142  K 4.4 4.5 4.3  CL 104 106 104  CO2 24 24 26   BUN 33* 22 11  CREATININE 1.57* 1.15* 0.94  CALCIUM 8.8 8.9 9.7  GLUCOSE 103* 94 116*    CBG (last 3)   Recent Labs  04/10/14 2300  GLUCAP 80    Scheduled Meds: . cefTRIAXone (ROCEPHIN)  IV  1 g Intravenous Q24H  . donepezil  20 mg Oral QHS  . escitalopram  20 mg Oral Daily  . heparin  5,000 Units Subcutaneous 3 times per day  . risperiDONE  1 mg Oral BID  . sodium chloride  3 mL Intravenous Q12H  . traZODone  100 mg Oral QHS    Continuous Infusions: . sodium chloride 10 mL/hr at 04/13/14 1010    Past Medical History  Diagnosis Date  . Allergy     allergic rhinitis  . Diabetes mellitus     type II  . Hypertension   . Osteopenia   . TIA (transient ischemic attack)   . Contact dermatitis     on buttocks  . Nosebleed   . Anxiety   . Renal insufficiency   . Pyelonephritis   . Dementia   . Chiari malformation  Past Surgical History  Procedure Laterality Date  . Rotator cuff repair  06/2007    right  . Abdominal hysterectomy    . Back surgery  03/1999    disk  . Breast surgery      breast biopsy x2 negative  . Shoulder surgery  2007    left  . Joint replacement      Rt knee patella replacement  . Colonoscopy  08/28/2012    Procedure: COLONOSCOPY;  Surgeon: Beryle Beams, MD;  Location: [facility] ENDOSCOPY;  Service: Endoscopy;  Laterality: N/A;    Terrace Arabia RD, LDN

## 2014-04-13 NOTE — Evaluation (Signed)
Occupational Therapy Evaluation Patient Details Name: FRANCOISE CHOJNOWSKI MRN: [id number] DOB: 1930/04/12 Today's Date: 04/13/2014    History of Present Illness 80 y.o. female with Past medical history of diabetes mellitus, hypertension, TIA, chronic kidney disease, dementia.  The patient was brought in by daughter. The patient presented with complaints of confusion, increasing urinary frequency and complaints of burning urination.    Clinical Impression   Pt admitted with above. She demonstrates the below listed deficits and will benefit from continued OT to maximize safety and independence with BADLs.  Currently, pt requires max - total A for all BADLs.  She has 24 hour caregivers at home, but unsure they will be able to provide current level of care.  Recommend SNF      Follow Up Recommendations  SNF    Equipment Recommendations  None recommended by OT    Recommendations for Other Services       Precautions / Restrictions Precautions Precautions: Fall      Mobility Bed Mobility                  Transfers Overall transfer level: Needs assistance Equipment used: 1 person hand held assist Transfers: Sit to/from Stand Sit to Stand: Mod assist         General transfer comment: Requires increased time and assist to move fully into erect standing.      Balance Overall balance assessment: Needs assistance Sitting-balance support: Feet supported Sitting balance-Leahy Scale: Poor     Standing balance support: Bilateral upper extremity supported Standing balance-Leahy Scale: Poor                              ADL Overall ADL's : Needs assistance/impaired Eating/Feeding: Maximal assistance;Sitting   Grooming: Wash/dry hands;Wash/dry face;Oral care;Applying deodorant;Brushing hair;Total assistance;Sitting   Upper Body Bathing: Total assistance;Sitting   Lower Body Bathing: Total assistance;Sit to/from stand   Upper Body Dressing : Total  assistance;Sitting   Lower Body Dressing: Total assistance;Sit to/from stand   Toilet Transfer: Maximal assistance;Stand-pivot;BSC   Toileting- Clothing Manipulation and Hygiene: Total assistance;Sit to/from stand       Functional mobility during ADLs: Moderate assistance (sit to stand) General ADL Comments: Pt able to assist with drinking from cup.  Otherewise unable to engage in self care activities.  Pt was able to move sit to stand x 3     Vision                     Perception     Praxis      Pertinent Vitals/Pain No indication of pain      Hand Dominance     Extremity/Trunk Assessment Upper Extremity Assessment Upper Extremity Assessment: RUE deficits/detail;Generalized weakness RUE Deficits / Details: Pt with h/o Rt shoulder injury/issues.  Limited AROM Lt. shoulder   Lower Extremity Assessment Lower Extremity Assessment: Defer to PT evaluation   Cervical / Trunk Assessment Cervical / Trunk Assessment: Kyphotic   Communication Communication Communication: HOH   Cognition Arousal/Alertness: Awake/alert Behavior During Therapy: WFL for tasks assessed/performed Overall Cognitive Status: Impaired/Different from baseline Area of Impairment: Orientation;Attention;Following commands;Memory;Awareness;Problem solving Orientation Level: Disoriented to;Place;Time;Situation Current Attention Level: Focused Memory: Decreased short-term memory Following Commands: Follows one step commands inconsistently Safety/Judgement: Decreased awareness of deficits;Decreased awareness of safety   Problem Solving: Slow processing;Decreased initiation;Difficulty sequencing;Requires verbal cues;Requires tactile cues General Comments: Pt with h/o dementia.  Caregiver present who reports pt is more confused than  normal   General Comments       Exercises       Shoulder Instructions      Home Living Family/patient expects to be discharged to:: Private residence Living  Arrangements: Children Available Help at Discharge: Family;Available 24 hours/day;Personal care attendant Type of Home: House Home Access: Ramped entrance     Home Layout: Able to live on main level with bedroom/bathroom     Bathroom Shower/Tub: Tub/shower unit Shower/tub characteristics: Architectural technologist: Standard Bathroom Accessibility: Yes How Accessible: Accessible via walker Home Equipment: [place] - 2 wheels;Bedside commode;Hospital bed;Wheelchair - manual;Grab bars - toilet          Prior Functioning/Environment Level of Independence: Needs assistance  Gait / Transfers Assistance Needed: Pt able to ambulate with supervision with RW ADL's / Homemaking Assistance Needed: Pt able to perform toilet transfer and toileting with supervision, and was able to self feed.  Required assist for all other aspects of ADLs        OT Diagnosis: Generalized weakness;Cognitive deficits   OT Problem List: Decreased strength;Decreased activity tolerance;Impaired balance (sitting and/or standing);Decreased cognition;Decreased safety awareness;Decreased knowledge of use of DME or AE;Obesity   OT Treatment/Interventions: Self-care/ADL training;DME and/or AE instruction;Therapeutic activities;Cognitive remediation/compensation;Patient/family education;Balance training    OT Goals(Current goals can be found in the care plan section) Acute Rehab OT Goals OT Goal Formulation: Patient unable to participate in goal setting Time For Goal Achievement: 04/27/14 Potential to Achieve Goals: Good ADL Goals Pt Will Perform Eating: with set-up;sitting Pt Will Transfer to Toilet: ambulating;with mod assist;bedside commode Pt Will Perform Toileting - Clothing Manipulation and hygiene: with mod assist;sit to/from stand  OT Frequency: Min 2X/week   Barriers to D/C: Decreased caregiver support          Co-evaluation              End of Session Nurse Communication: Mobility  status  Activity Tolerance: Patient tolerated treatment well Patient left: in chair;with call bell/phone within reach;with chair alarm set;with family/visitor present   Time: 2229-7989 OT Time Calculation (min): 25 min Charges:  OT General Charges $OT Visit: 1 Procedure OT Evaluation $Initial OT Evaluation Tier I: 1 Procedure OT Treatments $Therapeutic Activity: 8-22 mins G-Codes:    Chaunte Hornbeck M Mekaila Tarnow 04-18-14, 5:52 PM

## 2014-04-13 NOTE — Clinical Social Work Note (Signed)
CSW received consult for SNF placement. Advised by patient's nurse that POA Linda [name] wants [facility] and has been talking to staff at facility. CSW will f/u with patient/POA regarding placement.  Linda [name], MSW, LCSW [phone]

## 2014-04-14 LAB — BASIC METABOLIC PANEL
BUN: 14 mg/dL (ref 6–23)
CO2: 26 mEq/L (ref 19–32)
CREATININE: 1.1 mg/dL (ref 0.50–1.10)
Calcium: 9.4 mg/dL (ref 8.4–10.5)
Chloride: 101 mEq/L (ref 96–112)
GFR, EST AFRICAN AMERICAN: 53 mL/min — AB (ref >90)
GFR, EST NON AFRICAN AMERICAN: 45 mL/min — AB (ref >90)
Glucose, Bld: 102 mg/dL — ABNORMAL HIGH (ref 70–99)
Potassium: 4.3 mEq/L (ref 3.7–5.3)
Sodium: 141 mEq/L (ref 137–147)

## 2014-04-14 MED ORDER — ACETAMINOPHEN 325 MG PO TABS: 650.0000 mg | ORAL_TABLET | Freq: Four times a day (QID) | ORAL | Status: DC | PRN

## 2014-04-14 MED ORDER — RISPERIDONE 0.5 MG PO TABS: 1.0000 mg | ORAL_TABLET | Freq: Two times a day (BID) | ORAL | Status: DC

## 2014-04-14 MED ORDER — AMLODIPINE BESYLATE 5 MG PO TABS
5.0000 mg | ORAL_TABLET | Freq: Every day | ORAL | Status: DC
  Administered 2014-04-14: 5 mg via ORAL
  Filled 2014-04-14: qty 1

## 2014-04-14 MED ORDER — AMLODIPINE BESYLATE 5 MG PO TABS: 5.0000 mg | ORAL_TABLET | Freq: Every day | ORAL | Status: DC

## 2014-04-14 MED ORDER — OXYCODONE-ACETAMINOPHEN 5-325 MG PO TABS: 1.0000 | ORAL_TABLET | Freq: Three times a day (TID) | ORAL | Status: DC | PRN

## 2014-04-14 MED ORDER — GLUCERNA SHAKE PO LIQD: 237.0000 mL | Freq: Three times a day (TID) | ORAL | Status: DC

## 2014-04-14 MED ORDER — TRAZODONE HCL 50 MG PO TABS: 50.0000 mg | ORAL_TABLET | Freq: Every day | ORAL | Status: DC

## 2014-04-14 NOTE — Discharge Instructions (Signed)
Dementia Dementia is a general term for problems with brain function. A person with dementia has memory loss and a hard time with at least one other brain function such as thinking, speaking, or problem solving. Dementia can affect social functioning, how you do your job, your mood, or your personality. The changes may be hidden for a long time. The earliest forms of this disease are usually not detected by family or friends. Dementia can be:  Irreversible.  Potentially reversible.  Partially reversible.  Progressive. This means it can get worse over time. CAUSES  Irreversible dementia causes may include:  Degeneration of brain cells (Alzheimer's disease or lewy body dementia).  Multiple small strokes (vascular dementia).  Infection (chronic meningitis or Creutzfelt-Jakob disease).  Frontotemporal dementia. This affects younger people, age 40 to 70, compared to those who have Alzheimer's disease.  Dementia associated with other disorders like Parkinson's disease, Huntington's disease, or HIV-associated dementia. Potentially or partially reversible dementia causes may include:  Medicines.  Metabolic causes such as excessive alcohol intake, vitamin B12 deficiency, or thyroid disease.  Masses or pressure in the brain such as a tumor, blood clot, or hydrocephalus. SYMPTOMS  Symptoms are often hard to detect. Family members or coworkers may not notice them early in the disease process. Different people with dementia may have different symptoms. Symptoms can include:  A hard time with memory, especially recent memory. Long-term memory may not be impaired.  Asking the same question multiple times or forgetting something someone just said.  A hard time speaking your thoughts or finding certain words.  A hard time solving problems or performing familiar tasks (such as how to use a telephone).  Sudden changes in mood.  Changes in personality, especially increasing moodiness or  mistrust.  Depression.  A hard time understanding complex ideas that were never a problem in the past. DIAGNOSIS  There are no specific tests for dementia.   Your caregiver may recommend a thorough evaluation. This is because some forms of dementia can be reversible. The evaluation will likely include a physical exam and getting a detailed history from you and a family member. The history often gives the best clues and suggestions for a diagnosis.  Memory testing may be done. A detailed brain function evaluation called neuropsychologic testing may be helpful.  Lab tests and brain imaging (such as a CT scan or MRI scan) are sometimes important.  Sometimes observation and re-evaluation over time is very helpful. TREATMENT  Treatment depends on the cause.   If the problem is a vitamin deficiency, it may be helped or cured with supplements.  For dementias such as Alzheimer's disease, medicines are available to stabilize or slow the course of the disease. There are no cures for this type of dementia.  Your caregiver can help direct you to groups, organizations, and other caregivers to help with decisions in the care of you or your loved one. HOME CARE INSTRUCTIONS The care of individuals with dementia is varied and dependent upon the progression of the dementia. The following suggestions are intended for the person living with, or caring for, the person with dementia.  Create a safe environment.  Remove the locks on bathroom doors to prevent the person from accidentally locking himself or herself in.  Use childproof latches on kitchen cabinets and any place where cleaning supplies, chemicals, or alcohol are kept.  Use childproof covers in unused electrical outlets.  Install childproof devices to keep doors and windows secured.  Remove stove knobs or install safety   knobs and an automatic shut-off on the stove.  Lower the temperature on water heaters.  Label medicines and keep them  locked up.  Secure knives, lighters, matches, power tools, and guns, and keep these items out of reach.  Keep the house free from clutter. Remove rugs or anything that might contribute to a fall.  Remove objects that might break and hurt the person.  Make sure lighting is good, both inside and outside.  Install grab rails as needed.  Use a monitoring device to alert you to falls or other needs for help.  Reduce confusion.  Keep familiar objects and people around.  Use night lights or dim lights at night.  Label items or areas.  Use reminders, notes, or directions for daily activities or tasks.  Keep a simple, consistent routine for waking, meals, bathing, dressing, and bedtime.  Create a calm, quiet environment.  Place large clocks and calendars prominently.  Display emergency numbers and home address near all telephones.  Use cues to establish different times of the day. An example is to open curtains to let the natural light in during the day.   Use effective communication.  Choose simple words and short sentences.  Use a gentle, calm tone of voice.  Be careful not to interrupt.  If the person is struggling to find a word or communicate a thought, try to provide the word or thought.  Ask one question at a time. Allow the person ample time to answer questions. Repeat the question again if the person does not respond.  Reduce nighttime restlessness.  Provide a comfortable bed.  Have a consistent nighttime routine.  Ensure a regular walking or physical activity schedule. Involve the person in daily activities as much as possible.  Limit napping during the day.  Limit caffeine.  Attend social events that stimulate rather than overwhelm the senses.  Encourage good nutrition and hydration.  Reduce distractions during meal times and snacks.  Avoid foods that are too hot or too cold.  Monitor chewing and swallowing ability.  Continue with routine vision,  hearing, dental, and medical screenings.  Only give over-the-counter or prescription medicines as directed by the caregiver.  Monitor driving abilities. Do not allow the person to drive when safe driving is no longer possible.  Register with an identification program which could provide location assistance in the event of a missing person situation. SEEK MEDICAL CARE IF:   New behavioral problems start such as moodiness, aggressiveness, or seeing things that are not there (hallucinations).  Any new problem with brain function happens. This includes problems with balance, speech, or falling a lot.  Problems with swallowing develop.  Any symptoms of other illness happen. Small changes or worsening in any aspect of brain function can be a sign that the illness is getting worse. It can also be a sign of another medical illness such as infection. Seeing a caregiver right away is important. SEEK IMMEDIATE MEDICAL CARE IF:   A fever develops.  New or worsened confusion develops.  New or worsened sleepiness develops.  Staying awake becomes hard to do. Document Released: 04/22/2001 Document Revised: 01/19/2012 Document Reviewed: 03/24/2011 [employer] Patient Information 2014 [employer].  

## 2014-04-14 NOTE — Progress Notes (Signed)
Pt discharged to [facility] per MD. Bed offer received from [facility]. Family accepted. NSL discontinued with catheter intact. [facility] to provide transport. Report called to Malcolm at [facility]. Manya Silvas, RN

## 2014-04-14 NOTE — Discharge Summary (Signed)
[facility]  Physician Discharge Summary   Patient ID: Linda [name] MRN: [id number] DOB/AGE: Oct 24, 1930 80 y.o.  Admit date: 04/10/2014 Discharge date: 04/14/2014  PCP: Linda Sake, MD  DISCHARGE DIAGNOSES:  Principal Problem:   Acute encephalopathy Active Problems:   DIABETES MELLITUS, TYPE II   HYPERTENSION   Anemia   Dementia   Acute on chronic renal failure   Urinary tract infection   Slurred speech   RECOMMENDATIONS FOR OUTPATIENT FOLLOW UP: 1. Watch for over sedation due to pain medications  DISCHARGE CONDITION: fair  Diet recommendation: Heart Healthy  Filed Weights   04/11/14 1934 04/12/14 2100 04/13/14 2115  Weight: 106.323 kg (234 lb 6.4 oz) 106.323 kg (234 lb 6.4 oz) 104.327 kg (230 lb)    INITIAL HISTORY: Linda [name] is a 80 y.o. female with Past medical history of diabetes mellitus, hypertension, TIA, chronic kidney disease, dementia. The patient was brought in by daughter. The patient presented with complaints of confusion, increasing urinary frequency and complaints of burning urination.   Consultations:  None  Procedures:  None  HOSPITAL COURSE:   Acute Encephalopathy  Possibly due to UTI. MRI brain is negative for stroke. No focal deficits on examination. Underlying dementia playing a major role. Patient lethargic today as she was given narcotic early AM. Medications possibly contributing to her confusion as well. Will cut back on her pain medication and Trazodone.  Acute on Chronic Renal Failure Stage 4  Improved with IVF.  Questionable UTI  UA with too numerous to count WBC. She was placed on Ceftriaxone and has been given 4 day course. Urine culture however did not show any growth. Will not continue when discharged.   Pancreatic Lesion/Abdominal pain, nausea, vomiting  CT abdomen show no acute intra-abdominal or pelvic process. Gallstones versus sludge without CT findings of acute cholecystitis. Suspected 18 mm cystic  pancreatic mass appears new. MRI/MRCP report reviewed this is most likely a benign lesion. Discussed with daughter, Linda [name]. She agrees with no further work up for same. LFT's are normal. Abdomen is soft and benign.   Chronic pain  Medications changed to PRN to avoid over sedation and dose was reduced as well.  Dementia  On Aricept   Code status: Full Code. This was discussed with her niece today. I have encouraged family to discuss this issue further and apprise SNF of their decision.  Patient lethargic today which is most likely due to narcotics. This should improve as day goes on. Her sleep cycle os also off. She is arousable and able to communicate. She is considered stable for discharge to SNF. Discussed with her niece who was at bedside.   PERTINENT LABS:  The results of significant diagnostics from this hospitalization (including imaging, microbiology, ancillary and laboratory) are listed below for reference.    Microbiology: Recent Results (from the past 240 hour(s))  URINE CULTURE     Status: None   Collection Time    04/11/14 11:20 AM      Result Value Ref Range Status   Specimen Description URINE, CLEAN CATCH   Final   Special Requests NONE   Final   Culture  Setup Time     Final   Value: 04/11/2014 16:44     Performed at [facility]     Final   Value: NO GROWTH     Performed at [facility]   Culture     Final   Value: NO GROWTH  Performed at [facility]   Report Status 04/12/2014 FINAL   Final     Labs: Basic Metabolic Panel:  Recent Labs Lab 04/10/14 1855 04/11/14 0617 04/12/14 0534 04/13/14 1120 04/14/14 0516  NA 141 139 141 142 141  K 4.7 4.4 4.5 4.3 4.3  CL 104 104 106 104 101  CO2 24 24 24 26 26   GLUCOSE 97 103* 94 116* 102*  BUN 36* 33* 22 11 14   CREATININE 1.73* 1.57* 1.15* 0.94 1.10  CALCIUM 9.5 8.8 8.9 9.7 9.4   Liver Function Tests:  Recent Labs Lab 04/10/14 1855 04/11/14 0617  AST 13 10    ALT 7 6  ALKPHOS 49 46  BILITOT 0.3 0.3  PROT 6.2 5.3*  ALBUMIN 3.3* 2.8*    Recent Labs Lab 04/10/14 1855  AMMONIA 34   CBC:  Recent Labs Lab 04/10/14 1855 04/11/14 0617 04/12/14 0534 04/13/14 1120  WBC 7.4 6.9 7.0 8.0  NEUTROABS 4.6  --   --   --   HGB 11.9* 10.4* 11.3* 12.3  HCT 37.8 32.3* 35.1* 36.4  MCV 92.6 91.2 90.7 88.6  PLT 203 186 204 219   Cardiac Enzymes:  Recent Labs Lab 04/10/14 1855  TROPONINI <0.30   BNP: BNP (last 3 results)  Recent Labs  04/10/14 1855  PROBNP 239.0   CBG:  Recent Labs Lab 04/10/14 2300  GLUCAP 80     IMAGING STUDIES Ct Abdomen Pelvis Wo Contrast  04/11/2014   CLINICAL DATA:  Altered mental status. Chronic kidney failure. Lethargy. Alzheimer's. Right hip pain.  EXAM: CT ABDOMEN AND PELVIS WITHOUT CONTRAST  TECHNIQUE: Multidetector CT imaging of the abdomen and pelvis was performed following the standard protocol without IV contrast.  COMPARISON:  CT of the abdomen pelvis January 03, 2013.  FINDINGS: Included view of the lungs demonstrate mild pleural thickening dependent atelectasis. Heart size is normal, mitral annular calcifications. Pericardium is nonsuspicious.  The liver, spleen, adrenal glands are nonsuspicious. Layering tiny gallstones versus sludge in the gallbladder. Suspected 18 mm pancreatic mass within the body, axial 21/88, this was not clearly present on prior CT.  Similar mild fullness of the right renal collecting system with lobulated cortex. Atrophic left kidney with tiny lower pole calcifications. Stable 16 mm left upper pole cyst. Urinary bladder is partially distended, and otherwise unremarkable. Moderate to severe calcific atherosclerosis of the aortoiliac vessels which are normal in course and caliber. Status post hysterectomy.  Small hiatal hernia. Stomach, small and large bowel are normal in course and caliber without inflammatory changes. The appendix is not discretely identified, however there are no  inflammatory changes in the right lower quadrant. . Sigmoid diverticulosis. No intraperitoneal free fluid nor free air.  Osteopenia. Grade 1 L4-5 anterolisthesis on degenerative basis with moderate neural foraminal narrowing. Severe L5-S1 degenerative disc disease. Mild similar L1 compression fracture.  IMPRESSION: No acute intra-abdominal or pelvic process. Gallstones versus sludge without CT findings of acute cholecystitis.  Suspected 18 mm cystic pancreatic mass appears new, limited assessment on noncontrast examination. Findings would be better characterized on MRI pancreatic mass protocol, as clinically indicated.  Similar severe left renal atrophy/scarring with nonobstructing left lower pole calculi.   Electronically Signed   By: Elon Alas   On: 04/11/2014 01:45   Dg Chest 2 View  04/10/2014   CLINICAL DATA:  Altered mental status, fall  EXAM: CHEST  2 VIEW  COMPARISON:  Prior chest x-ray 06/11/2013  FINDINGS: Stable cardiac and mediastinal contours. Atherosclerotic calcification present  in the transverse aorta. Surgical changes of prior right shoulder arthroplasty. No evidence of hardware complication in the visualized portion. No pneumothorax, pleural effusion or focal airspace consolidation. Stable bronchitic changes and interstitial prominence. Incompletely imaged anterior cervical fusion hardware at C6-C7. No acute fracture. Multilevel degenerative spurring throughout the thoracic spine.  IMPRESSION: Stable chest x-ray without evidence of acute cardiopulmonary process or fracture.   Electronically Signed   By: Jacqulynn Cadet M.D.   On: 04/10/2014 20:48   Dg Hip Complete Right  04/10/2014   CLINICAL DATA:  Fall.  Right hip pain.  EXAM: RIGHT HIP - COMPLETE 2+ VIEW  COMPARISON:  03/28/2013  FINDINGS: No fracture or bone lesion.  There is mild narrowing of the superior lateral hip joint spaces bilaterally with bony prominence from the superior acetabula and mild superior lateral subchondral  acetabular cystic change. This is stable. There are bony productive changes from the greater trochanters bilaterally.  Bones are diffusely demineralized. Bony pelvis is intact. SI joints and symphysis pubis are normally spaced and aligned.  Soft tissues are unremarkable.  IMPRESSION: No fracture or acute finding.  No change from the prior study.   Electronically Signed   By: Lajean Manes M.D.   On: 04/10/2014 20:50   Ct Head Wo Contrast  04/10/2014   CLINICAL DATA:  Altered mental status and failure to thrive. Dementia.  EXAM: CT HEAD WITHOUT CONTRAST  TECHNIQUE: Contiguous axial images were obtained from the base of the skull through the vertex without intravenous contrast.  COMPARISON:  06/11/2013  FINDINGS: Sinuses/Soft tissues: Minimal ethmoid air cell mucosal thickening. Hyperostosis frontalis interna. Clear mastoid air cells.  Intracranial: Suspect a small left frontal lobe meningioma of approximately 9 mm on image 21/series 2. Grossly similar to on the prior exam. No surrounding cerebral edema or significant mass effect. Mild low density in the periventricular white matter likely related to small vessel disease. Otherwise, no mass lesion, hemorrhage, hydrocephalus, acute infarct, intra-axial, or extra-axial fluid collection.  IMPRESSION: 1. No acute intracranial abnormality. Mild small vessel ischemic change. 2. Small left frontal lobe meningioma, not significantly changed. 3. Minimal sinus disease.   Electronically Signed   By: Abigail Miyamoto M.D.   On: 04/10/2014 19:59   Mr Brain Wo Contrast  04/12/2014   CLINICAL DATA:  80 year old female with slurred speech. TIA. Diabetes and hypertension. Initial encounter.  EXAM: MRI HEAD WITHOUT CONTRAST  TECHNIQUE: Multiplanar, multiecho pulse sequences of the brain and surrounding structures were obtained without intravenous contrast.  COMPARISON:  Non contrast head CT 04/10/2014 and earlier.  FINDINGS: Cerebral volume is within normal limits for age. No  restricted diffusion to suggest acute infarction. No midline shift, mass effect, ventriculomegaly, extra-axial collection or acute intracranial hemorrhage. Cervicomedullary junction and pituitary are within normal limits. Negative for age visualized cervical spine. Major intracranial vascular flow voids are preserved.  A 19 mm T2 dark extra-axial area along the left convexity which was partially calcified on the most recent CT is stable in size. This does not demonstrate increased diffusion typical of of small meningioma. There is no significant mass effect. No associated cerebral edema.  Mild for age scattered nonspecific cerebral white matter T2 and FLAIR hyperintense areas. Mild to moderate involvement of the central pons. No cortical encephalomalacia identified. Occasional chronic micro hemorrhages in the brain (right periatrial white matter series 8, image 113).  Visible internal auditory structures appear normal. Mastoids are clear. Mild paranasal sinus mucosal thickening. Postoperative changes to the globes. Visualized scalp soft tissues  are within normal limits. Normal bone marrow signal.  IMPRESSION: 1.  No acute intracranial abnormality. 2. Small left convexity lesion (identified on prior CTs) may be more likely an incidental dural calcification rather than a small meningioma. No associated mass effect or edema. 3. Mild for age chronic small vessel disease.   Electronically Signed   By: Lars Pinks M.D.   On: 04/12/2014 18:37   Mr Abdomen Mrcp Wo Cm  04/13/2014   CLINICAL DATA:  Possible pancreatic lesion on CT, dementia  EXAM: MRI ABDOMEN WITHOUT  (INCLUDING MRCP)  TECHNIQUE: Multiplanar multisequence MR imaging of the abdomen was performed. Heavily T2-weighted images of the biliary and pancreatic ducts were obtained, and three-dimensional MRCP images were rendered by post processing.  COMPARISON:  CT abdomen pelvis dated 04/11/2014  FINDINGS: Motion degraded images.  Liver is grossly unremarkable.   Spleen and adrenal glands are within normal limits.  Pancreas is notable for a 1.7 cm cystic lesion in the pancreatic body (series 4/ image 23). No solid component. No definite ductal communication. No associated pancreatic ductal dilatation. No pancreatic atrophy.  Two adjacent tiny cystic lesions measuring 4-5 mm in the pancreatic body (series 4/images 23 and 26).  Gallbladder is notable for layering sludge and/or small stones (series 4/image 30). No intrahepatic or extrahepatic ductal dilatation.  Left renal atrophy. Multiple left renal cysts measuring up to 1.5 cm in the posterior left upper kidney (series 4/ image 32). Right kidney is grossly unremarkable. No hydronephrosis.  No abdominal ascites.  No suspicious abdominal lymphadenopathy.  No focal osseous lesions.  IMPRESSION: Motion degraded images.  1.7 cm cystic lesion in the pancreatic body, likely reflecting a benign pseudocyst or IPMN, less likely an oligocystic serous cystadenoma. No malignant features on unenhanced MRI.  If this patient would be considered a surgical candidate, a single follow-up MRI abdomen with/without half-dose contrast could be performed in 12 months, as clinically warranted.  This recommendation follows ACR consensus guidelines: Managing Incidental Findings on Abdominal CT: White Paper of the ACR Incidental Findings Committee. [facility] [phone]   Electronically Signed   By: Julian Hy M.D.   On: 04/13/2014 08:35    DISCHARGE EXAMINATION: Filed Vitals:   04/13/14 1735 04/13/14 2115 04/14/14 0512 04/14/14 0932  BP: 155/93 129/76 160/81 135/69  Pulse: 96 68 68 66  Temp: 98.3 F (36.8 C) 98.4 F (36.9 C) 98.3 F (36.8 C) 98.3 F (36.8 C)  TempSrc: Oral Oral Oral Oral  Resp: 19 16 16 17   Height:  5\' 5"  (1.651 m)    Weight:  104.327 kg (230 lb)    SpO2: 90% 95% 92% 91%   General appearance: sleepy but arousable Resp: clear to auscultation bilaterally Cardio: regular rate and rhythm, S1, S2  normal, no murmur, click, rub or gallop GI: soft, non-tender; bowel sounds normal; no masses,  no organomegaly Neurologic: No focal deficits  DISPOSITION: SNF  Discharge Instructions   Diet - low sodium heart healthy    Complete by:  As directed      Increase activity slowly    Complete by:  As directed            ALLERGIES:  Allergies  Allergen Reactions  . Amoxicillin Other (See Comments)    REACTION: aching all over  . Atorvastatin Other (See Comments)    REACTION: increased CPK  . Cephalexin Nausea Only and Other (See Comments)    Weak, Headache  . Cetirizine Hcl Other (See Comments)  REACTION: makes her head feel big  . Cortisone Other (See Comments)    REACTION: increased bp  . Ezetimibe-Simvastatin Other (See Comments)    unknown  . Fentanyl Other (See Comments)    REACTION: stinging in her skin  . Guaifenesin Other (See Comments)    unknown  . Ketorolac Tromethamine     REACTION: no work  . Lisinopril Cough    ACE related  . Metformin Other (See Comments)    epistaxis  . Nsaids Other (See Comments)    epistaxis  . Penicillins Nausea Only and Other (See Comments)    headache  . Rofecoxib Itching  . Rosuvastatin Other (See Comments)    Myalgias  . Simvastatin Other (See Comments)    unknown  . Spironolactone Nausea Only  . Sulfamethoxazole-Tmp Ds Other (See Comments)    unknown  . Codeine Rash  . Tape Rash    Current Discharge Medication List    START taking these medications   Details  acetaminophen (TYLENOL) 325 MG tablet Take 2 tablets (650 mg total) by mouth every 6 (six) hours as needed for mild pain (or Fever >/= 101).    amLODipine (NORVASC) 5 MG tablet Take 1 tablet (5 mg total) by mouth daily.    feeding supplement, GLUCERNA SHAKE, (GLUCERNA SHAKE) LIQD Take 237 mLs by mouth 3 (three) times daily between meals. Refills: 0    oxyCODONE-acetaminophen (PERCOCET/ROXICET) 5-325 MG per tablet Take 1-2 tablets by mouth every 8 (eight) hours  as needed for severe pain. Qty: 30 tablet, Refills: 0      CONTINUE these medications which have CHANGED   Details  risperiDONE (RISPERDAL) 0.5 MG tablet Take 2 tablets (1 mg total) by mouth 2 (two) times daily. Qty: 30 tablet, Refills: 0    traZODone (DESYREL) 50 MG tablet Take 1 tablet (50 mg total) by mouth at bedtime. Qty: 30 tablet, Refills: 0      CONTINUE these medications which have NOT CHANGED   Details  alendronate (FOSAMAX) 70 MG tablet Take 70 mg by mouth every 7 (seven) days. Saturday. Take with a full glass of water on an empty stomach.    donepezil (ARICEPT) 10 MG tablet Take 20 mg by mouth at bedtime.     escitalopram (LEXAPRO) 20 MG tablet Take 20 mg by mouth daily. For nerves    nystatin (MYCOSTATIN) powder Apply topically daily. Apply under breasts and under stomach fold.    ondansetron (ZOFRAN) 4 MG tablet Take 4 mg by mouth every 8 (eight) hours as needed for nausea or vomiting.    senna (SENOKOT) 8.6 MG TABS tablet Take 1 tablet by mouth daily as needed for mild constipation.      STOP taking these medications     glucose blood test strip      oxyCODONE-acetaminophen (PERCOCET) 10-325 MG per tablet        Follow-up Information   Follow up with [facility], MD. Schedule an appointment as soon as possible for a visit in 1 week. (post hospitalization follow up)    Specialty:  Family Medicine   Contact information:   Dr. Daiva Eves [place] [id number] [phone]       TOTAL DISCHARGE TIME: 91 mins  [facility] Pager [phone]  04/14/2014, 11:50 AM  Disclaimer: This note was dictated with voice recognition software. Similar sounding words can inadvertently be transcribed and may not be corrected upon review.

## 2014-04-14 NOTE — Clinical Social Work Placement (Signed)
Clinical Social Work Department CLINICAL SOCIAL WORK PLACEMENT NOTE 04/14/2014  Patient:  Linda [name], Linda [name]  Account Number:  [id number] Admit date:  04/10/2014  Clinical Social Worker:  Anarely Nicholls Givens, LCSW  Date/time:  04/14/2014 12:44 PM  Clinical Social Work is seeking post-discharge placement for this patient at the following level of care:   SKILLED NURSING   (*CSW will update this form in Epic as items are completed)     Patient/family provided with [facility] Department of Clinical Social Work's list of facilities offering this level of care within the geographic area requested by the patient (or if unable, by the patient's family).  04/14/2014  Patient/family informed of their freedom to choose among providers that offer the needed level of care, that participate in Medicare, Medicaid or managed care program needed by the patient, have an available bed and are willing to accept the patient.    Patient/family informed of [facility]' ownership interest in [facility], as well as of the fact that they are under no obligation to receive care at this facility.  PASARR submitted to EDS on 04/14/2014 PASARR number received from EDS on 04/14/2014  FL2 transmitted to all facilities in geographic area requested by pt/family on  04/14/2014 FL2 transmitted to all facilities within larger geographic area on   Patient informed that his/her managed care company has contracts with or will negotiate with  certain facilities, including the following:     Patient/family informed of bed offers received:  04/14/2014 Patient chooses bed at [facility] [place] Physician recommends and patient chooses bed at    Patient to be transferred to [place] on  04/14/2014 Patient to be transferred to facility by ambulance  The following physician request were entered in Epic:   Additional Comments:

## 2014-04-14 NOTE — Progress Notes (Signed)
Occupational Therapy Treatment Patient Details Name: Linda [name] MRN: [id number] DOB: 05/15/1930 Today's Date: 04/14/2014    History of present illness 80 y.o. female with Past medical history of diabetes mellitus, hypertension, TIA, chronic kidney disease, dementia.  The patient was brought in by daughter. The patient presented with complaints of confusion, increasing urinary frequency and complaints of burning urination.    OT comments  Pt very lethargic this morning with limited participation in ADLs at bed level. Continue to recommend SNF for d/c planning.  Follow Up Recommendations  SNF    Equipment Recommendations  None recommended by OT    Recommendations for Other Services      Precautions / Restrictions Precautions Precautions: Fall       Mobility Bed Mobility Overal bed mobility: Needs Assistance Bed Mobility: Rolling Rolling: Max assist         General bed mobility comments: Performed rolling to L/R sides as precursor for sitting EOB.  Assist to initiate and follow through with roll. Therapist utilized draw pad to assist with roll.  Transfers                      Balance                                   ADL   Eating/Feeding: Bed level;Maximal assistance   Grooming: Wash/dry face;Total assistance;Brushing hair;Bed level                                 General ADL Comments: Max cues to initiate UE movements during grooming/feeding tasks. Pt will attempt to bring hand up to face but requires total asisst from therapist to complete ADL task,      Vision                     Perception     Praxis      Cognition   Behavior During Therapy: [facility] for tasks assessed/performed Overall Cognitive Status: Impaired/Different from baseline Area of Impairment: Orientation;Attention;Following commands;Memory;Awareness;Problem solving Orientation Level: Disoriented to;Place;Time;Situation Current Attention Level:  Focused Memory: Decreased short-term memory  Following Commands: Follows one step commands inconsistently;Follows one step commands with increased time Safety/Judgement: Decreased awareness of deficits Awareness: Intellectual Problem Solving: Slow processing;Decreased initiation;Difficulty sequencing;Requires verbal cues;Requires tactile cues      Extremity/Trunk Assessment               Exercises     Shoulder Instructions       General Comments      Pertinent Vitals/ Pain       See vitals tab  Home Living                                          Prior Functioning/Environment              Frequency Min 2X/week     Progress Toward Goals  OT Goals(current goals can now be found in the care plan section)  Progress towards OT goals: Progressing toward goals  Acute Rehab OT Goals Patient Stated Goal: none stated  OT Goal Formulation: Patient unable to participate in goal setting Time For Goal Achievement: 04/27/14 Potential to Achieve Goals: Good ADL Goals Pt Will Perform Eating:  with set-up;sitting Pt Will Transfer to Toilet: ambulating;with mod assist;bedside commode Pt Will Perform Toileting - Clothing Manipulation and hygiene: with mod assist;sit to/from stand  Plan Discharge plan remains appropriate    Co-evaluation                 End of Session     Activity Tolerance Patient limited by lethargy   Patient Left in bed;with call bell/phone within reach   Nurse Communication          Time: 7414-2395 OT Time Calculation (min): 12 min  Charges: OT General Charges $OT Visit: 1 Procedure OT Treatments $Self Care/Home Management : 8-22 mins  Luther Bradley 04/14/2014, 1:23 PM 04/14/2014 Luther Bradley OTR/L Pager [phone] Office [phone]

## 2014-04-14 NOTE — Progress Notes (Signed)
04/13/14 20:00 Patient's daughter requests that all four side rails be up for safety. Kensy Blizard Lurline Hare, RN

## 2014-04-14 NOTE — Clinical Social Work Psychosocial (Signed)
Clinical Social Work Department BRIEF PSYCHOSOCIAL ASSESSMENT 04/14/2014  Patient:  Linda [name], Linda [name]     Account Number:  [id number]     Admit date:  04/10/2014  Clinical Social Worker:  Frederico Hamman  Date/Time:  04/14/2014 12:34 PM  Referred by:  Physician  Date Referred:  04/13/2014 Referred for  SNF Placement   Other Referral:   Interview type:  Family Other interview type:    PSYCHOSOCIAL DATA Living Status:  FAMILY Admitted from facility:   Level of care:   Primary support name:  Tawanna Sat Primary support relationship to patient:  CHILD, ADULT Degree of support available:   Good. Patient has 3 daughters and another family member Brain Hilts who is alternate POA. Daughter Tawanna Sat is main POA.    CURRENT CONCERNS Current Concerns  Post-Acute Placement   Other Concerns:    SOCIAL WORK ASSESSMENT / PLAN CSW talked with Brain Hilts [phone] by phone regarding SNF placement. CSW had already been informed by patient's nurse Ty, on 04/13/14 that family wanted [facility] for rehab. Audelia Acton confirmed SNF choice and has already completed admissions paperwork at [facility]. Per Audelia Acton, the patient's daughter's are aware and she has been in communication with [name], primary POA regarding placement.    CSW talked with Nira Conn in admissions at [facility] regarding patient coming to their facility today and she is aware of patient, has talked with family and can take patient today.   Assessment/plan status:  No Further Intervention Required Other assessment/ plan:   Information/referral to community resources:   None needed or requested    PATIENT'S/FAMILY'S RESPONSE TO PLAN OF CARE: Family very proactive in seeking out a facility for patient. They are aware that patient will discharge today (6/5) to [facility].    **Patient will be transported by ambulance to SNF. Discharge information transmitted to facility.

## 2014-07-10 ENCOUNTER — Inpatient Hospital Stay (HOSPITAL_COMMUNITY)
Admission: EM | Admit: 2014-07-10 | Discharge: 2014-07-12 | DRG: 206 | Disposition: A | Discharge status: Home Health | Payer: Medicare Other | Attending: Internal Medicine | Admitting: Internal Medicine

## 2014-07-10 ENCOUNTER — Encounter (HOSPITAL_COMMUNITY): Payer: Self-pay | Admitting: Emergency Medicine

## 2014-07-10 ENCOUNTER — Emergency Department (HOSPITAL_COMMUNITY): Payer: Medicare Other

## 2014-07-10 DIAGNOSIS — I872 Venous insufficiency (chronic) (peripheral): Secondary | ICD-10-CM

## 2014-07-10 DIAGNOSIS — I951 Orthostatic hypotension: Secondary | ICD-10-CM

## 2014-07-10 DIAGNOSIS — Z8679 Personal history of other diseases of the circulatory system: Secondary | ICD-10-CM

## 2014-07-10 DIAGNOSIS — W57XXXA Bitten or stung by nonvenomous insect and other nonvenomous arthropods, initial encounter: Secondary | ICD-10-CM

## 2014-07-10 DIAGNOSIS — E7800 Pure hypercholesterolemia, unspecified: Secondary | ICD-10-CM

## 2014-07-10 DIAGNOSIS — M25551 Pain in right hip: Secondary | ICD-10-CM

## 2014-07-10 DIAGNOSIS — T887XXA Unspecified adverse effect of drug or medicament, initial encounter: Secondary | ICD-10-CM

## 2014-07-10 DIAGNOSIS — Z6835 Body mass index (BMI) 35.0-35.9, adult: Secondary | ICD-10-CM

## 2014-07-10 DIAGNOSIS — Z96659 Presence of unspecified artificial knee joint: Secondary | ICD-10-CM | POA: Diagnosis not present

## 2014-07-10 DIAGNOSIS — N390 Urinary tract infection, site not specified: Secondary | ICD-10-CM

## 2014-07-10 DIAGNOSIS — R5381 Other malaise: Secondary | ICD-10-CM | POA: Diagnosis present

## 2014-07-10 DIAGNOSIS — N179 Acute kidney failure, unspecified: Secondary | ICD-10-CM

## 2014-07-10 DIAGNOSIS — G9340 Encephalopathy, unspecified: Secondary | ICD-10-CM

## 2014-07-10 DIAGNOSIS — R404 Transient alteration of awareness: Secondary | ICD-10-CM | POA: Diagnosis present

## 2014-07-10 DIAGNOSIS — Z8673 Personal history of transient ischemic attack (TIA), and cerebral infarction without residual deficits: Secondary | ICD-10-CM

## 2014-07-10 DIAGNOSIS — R0902 Hypoxemia: Secondary | ICD-10-CM | POA: Diagnosis present

## 2014-07-10 DIAGNOSIS — L259 Unspecified contact dermatitis, unspecified cause: Secondary | ICD-10-CM | POA: Diagnosis present

## 2014-07-10 DIAGNOSIS — E669 Obesity, unspecified: Secondary | ICD-10-CM | POA: Diagnosis present

## 2014-07-10 DIAGNOSIS — J189 Pneumonia, unspecified organism: Secondary | ICD-10-CM

## 2014-07-10 DIAGNOSIS — N189 Chronic kidney disease, unspecified: Secondary | ICD-10-CM

## 2014-07-10 DIAGNOSIS — M949 Disorder of cartilage, unspecified: Secondary | ICD-10-CM

## 2014-07-10 DIAGNOSIS — N259 Disorder resulting from impaired renal tubular function, unspecified: Secondary | ICD-10-CM

## 2014-07-10 DIAGNOSIS — N3001 Acute cystitis with hematuria: Secondary | ICD-10-CM

## 2014-07-10 DIAGNOSIS — F411 Generalized anxiety disorder: Secondary | ICD-10-CM | POA: Diagnosis present

## 2014-07-10 DIAGNOSIS — R51 Headache: Secondary | ICD-10-CM

## 2014-07-10 DIAGNOSIS — R5383 Other fatigue: Secondary | ICD-10-CM | POA: Diagnosis not present

## 2014-07-10 DIAGNOSIS — R4781 Slurred speech: Secondary | ICD-10-CM

## 2014-07-10 DIAGNOSIS — F0390 Unspecified dementia without behavioral disturbance: Secondary | ICD-10-CM | POA: Diagnosis present

## 2014-07-10 DIAGNOSIS — B965 Pseudomonas (aeruginosa) (mallei) (pseudomallei) as the cause of diseases classified elsewhere: Secondary | ICD-10-CM | POA: Diagnosis present

## 2014-07-10 DIAGNOSIS — M5030 Other cervical disc degeneration, unspecified cervical region: Secondary | ICD-10-CM

## 2014-07-10 DIAGNOSIS — F419 Anxiety disorder, unspecified: Secondary | ICD-10-CM

## 2014-07-10 DIAGNOSIS — I10 Essential (primary) hypertension: Secondary | ICD-10-CM | POA: Diagnosis present

## 2014-07-10 DIAGNOSIS — E042 Nontoxic multinodular goiter: Secondary | ICD-10-CM

## 2014-07-10 DIAGNOSIS — I119 Hypertensive heart disease without heart failure: Secondary | ICD-10-CM

## 2014-07-10 DIAGNOSIS — Z79899 Other long term (current) drug therapy: Secondary | ICD-10-CM

## 2014-07-10 DIAGNOSIS — K921 Melena: Secondary | ICD-10-CM

## 2014-07-10 DIAGNOSIS — F430 Acute stress reaction: Secondary | ICD-10-CM

## 2014-07-10 DIAGNOSIS — R001 Bradycardia, unspecified: Secondary | ICD-10-CM

## 2014-07-10 DIAGNOSIS — R609 Edema, unspecified: Secondary | ICD-10-CM

## 2014-07-10 DIAGNOSIS — H9202 Otalgia, left ear: Secondary | ICD-10-CM

## 2014-07-10 DIAGNOSIS — J309 Allergic rhinitis, unspecified: Secondary | ICD-10-CM

## 2014-07-10 DIAGNOSIS — E119 Type 2 diabetes mellitus without complications: Secondary | ICD-10-CM | POA: Diagnosis present

## 2014-07-10 DIAGNOSIS — M899 Disorder of bone, unspecified: Secondary | ICD-10-CM

## 2014-07-10 DIAGNOSIS — M479 Spondylosis, unspecified: Secondary | ICD-10-CM

## 2014-07-10 DIAGNOSIS — R569 Unspecified convulsions: Secondary | ICD-10-CM

## 2014-07-10 LAB — COMPREHENSIVE METABOLIC PANEL
ALT: 8 U/L (ref 0–35)
AST: 13 U/L (ref 0–37)
Albumin: 3.5 g/dL (ref 3.5–5.2)
Alkaline Phosphatase: 46 U/L (ref 39–117)
Anion gap: 13 (ref 5–15)
BUN: 18 mg/dL (ref 6–23)
CO2: 27 meq/L (ref 19–32)
Calcium: 9.7 mg/dL (ref 8.4–10.5)
Chloride: 101 mEq/L (ref 96–112)
Creatinine, Ser: 1.07 mg/dL (ref 0.50–1.10)
GFR, EST AFRICAN AMERICAN: 54 mL/min — AB (ref >90)
GFR, EST NON AFRICAN AMERICAN: 47 mL/min — AB (ref >90)
GLUCOSE: 116 mg/dL — AB (ref 70–99)
POTASSIUM: 4.2 meq/L (ref 3.7–5.3)
SODIUM: 141 meq/L (ref 137–147)
Total Bilirubin: 0.4 mg/dL (ref 0.3–1.2)
Total Protein: 6.6 g/dL (ref 6.0–8.3)

## 2014-07-10 LAB — URINE MICROSCOPIC-ADD ON

## 2014-07-10 LAB — URINALYSIS, ROUTINE W REFLEX MICROSCOPIC
BILIRUBIN URINE: NEGATIVE
GLUCOSE, UA: NEGATIVE mg/dL
Ketones, ur: NEGATIVE mg/dL
Nitrite: NEGATIVE
PH: 5.5 (ref 5.0–8.0)
Protein, ur: NEGATIVE mg/dL
Specific Gravity, Urine: 1.019 (ref 1.005–1.030)
Urobilinogen, UA: 1 mg/dL (ref 0.0–1.0)

## 2014-07-10 LAB — TROPONIN I: Troponin I: <0.3 ng/mL (ref <0.30)

## 2014-07-10 LAB — CBC WITH DIFFERENTIAL/PLATELET
Basophils Absolute: 0 10*3/uL (ref 0.0–0.1)
Basophils Relative: 0 % (ref 0–1)
EOS PCT: 2 % (ref 0–5)
Eosinophils Absolute: 0.2 10*3/uL (ref 0.0–0.7)
HCT: 37.3 % (ref 36.0–46.0)
HEMOGLOBIN: 11.9 g/dL — AB (ref 12.0–15.0)
LYMPHS ABS: 1.6 10*3/uL (ref 0.7–4.0)
LYMPHS PCT: 21 % (ref 12–46)
MCH: 28.7 pg (ref 26.0–34.0)
MCHC: 31.9 g/dL (ref 30.0–36.0)
MCV: 90.1 fL (ref 78.0–100.0)
Monocytes Absolute: 0.4 10*3/uL (ref 0.1–1.0)
Monocytes Relative: 5 % (ref 3–12)
NEUTROS PCT: 72 % (ref 43–77)
Neutro Abs: 5.5 10*3/uL (ref 1.7–7.7)
Platelets: 221 10*3/uL (ref 150–400)
RBC: 4.14 MIL/uL (ref 3.87–5.11)
RDW: 13.6 % (ref 11.5–15.5)
WBC: 7.6 10*3/uL (ref 4.0–10.5)

## 2014-07-10 LAB — CBG MONITORING, ED: Glucose-Capillary: 121 mg/dL — ABNORMAL HIGH (ref 70–99)

## 2014-07-10 LAB — GLUCOSE, CAPILLARY: Glucose-Capillary: 131 mg/dL — ABNORMAL HIGH (ref 70–99)

## 2014-07-10 LAB — LACTIC ACID, PLASMA: Lactic Acid, Venous: 1 mmol/L (ref 0.5–2.2)

## 2014-07-10 MED ORDER — GLUCERNA SHAKE PO LIQD
237.0000 mL | Freq: Three times a day (TID) | ORAL | Status: DC
  Administered 2014-07-10 – 2014-07-12 (×4): 237 mL via ORAL

## 2014-07-10 MED ORDER — VANCOMYCIN HCL IN DEXTROSE 1-5 GM/200ML-% IV SOLN
1000.0000 mg | Freq: Once | INTRAVENOUS | Status: AC
  Administered 2014-07-10: 1000 mg via INTRAVENOUS
  Filled 2014-07-10: qty 200

## 2014-07-10 MED ORDER — AMLODIPINE BESYLATE 5 MG PO TABS: 5.0000 mg | ORAL_TABLET | Freq: Every day | ORAL | Status: DC

## 2014-07-10 MED ORDER — ACETAMINOPHEN 325 MG PO TABS: 650.0000 mg | ORAL_TABLET | Freq: Four times a day (QID) | ORAL | Status: DC | PRN

## 2014-07-10 MED ORDER — SODIUM CHLORIDE 0.9 % IJ SOLN
3.0000 mL | Freq: Two times a day (BID) | INTRAMUSCULAR | Status: DC
  Administered 2014-07-10 – 2014-07-11 (×3): 3 mL via INTRAVENOUS

## 2014-07-10 MED ORDER — RISPERIDONE 1 MG PO TABS
1.0000 mg | ORAL_TABLET | Freq: Two times a day (BID) | ORAL | Status: DC
  Administered 2014-07-10 – 2014-07-12 (×4): 1 mg via ORAL
  Filled 2014-07-10 (×5): qty 1

## 2014-07-10 MED ORDER — INSULIN ASPART 100 UNIT/ML ~~LOC~~ SOLN
0.0000 [IU] | Freq: Every day | SUBCUTANEOUS | Status: DC
  Administered 2014-07-11: 2 [IU] via SUBCUTANEOUS

## 2014-07-10 MED ORDER — ESCITALOPRAM OXALATE 20 MG PO TABS
20.0000 mg | ORAL_TABLET | Freq: Every day | ORAL | Status: DC
  Administered 2014-07-10 – 2014-07-12 (×3): 20 mg via ORAL
  Filled 2014-07-10 (×2): qty 1
  Filled 2014-07-10: qty 2

## 2014-07-10 MED ORDER — PIPERACILLIN-TAZOBACTAM 3.375 G IVPB: 3.3750 g | Freq: Once | INTRAVENOUS | Status: DC

## 2014-07-10 MED ORDER — PIPERACILLIN-TAZOBACTAM 3.375 G IVPB 30 MIN
3.3750 g | INTRAVENOUS | Status: AC
  Administered 2014-07-10: 3.375 g via INTRAVENOUS
  Filled 2014-07-10: qty 50

## 2014-07-10 MED ORDER — ACETAMINOPHEN 650 MG RE SUPP: 650.0000 mg | Freq: Four times a day (QID) | RECTAL | Status: DC | PRN

## 2014-07-10 MED ORDER — HEPARIN SODIUM (PORCINE) 5000 UNIT/ML IJ SOLN
5000.0000 [IU] | Freq: Three times a day (TID) | INTRAMUSCULAR | Status: DC
  Administered 2014-07-10 – 2014-07-12 (×5): 5000 [IU] via SUBCUTANEOUS
  Filled 2014-07-10 (×11): qty 1

## 2014-07-10 MED ORDER — LORAZEPAM 2 MG/ML IJ SOLN: 2.0000 mg | INTRAMUSCULAR | Status: DC | PRN

## 2014-07-10 MED ORDER — PIPERACILLIN-TAZOBACTAM 3.375 G IVPB
3.3750 g | Freq: Three times a day (TID) | INTRAVENOUS | Status: DC
  Administered 2014-07-11 – 2014-07-12 (×5): 3.375 g via INTRAVENOUS
  Filled 2014-07-10 (×6): qty 50

## 2014-07-10 MED ORDER — DONEPEZIL HCL 10 MG PO TABS
20.0000 mg | ORAL_TABLET | Freq: Every day | ORAL | Status: DC
  Administered 2014-07-10 – 2014-07-11 (×2): 20 mg via ORAL
  Filled 2014-07-10 (×3): qty 2

## 2014-07-10 MED ORDER — INSULIN ASPART 100 UNIT/ML ~~LOC~~ SOLN
0.0000 [IU] | Freq: Three times a day (TID) | SUBCUTANEOUS | Status: DC
  Administered 2014-07-10: 2 [IU] via SUBCUTANEOUS
  Administered 2014-07-11: 3 [IU] via SUBCUTANEOUS
  Filled 2014-07-10: qty 1

## 2014-07-10 MED ORDER — OXYCODONE-ACETAMINOPHEN 5-325 MG PO TABS
1.0000 | ORAL_TABLET | Freq: Three times a day (TID) | ORAL | Status: DC | PRN
  Administered 2014-07-10 – 2014-07-11 (×2): 2 via ORAL
  Administered 2014-07-11: 1 via ORAL
  Filled 2014-07-10 (×2): qty 2
  Filled 2014-07-10: qty 1

## 2014-07-10 NOTE — ED Notes (Signed)
Family at bedside. 

## 2014-07-10 NOTE — Progress Notes (Signed)
Pt arrived via ambulance from [facility], alert and cooperative. VSS. Pt answering questions appropriate per baseline. Stage 2 noted on pt buttocks upon arrival. Form pad placed and off loaded area. Will reposition q 2 h and PRN. Will have Wound consulted order requested. Pt denies pain. Pharmacy saw pt and family member.  Daughter at the bedside. Both oriented to room and safety. Bed alarm on.

## 2014-07-10 NOTE — H&P (Addendum)
[facility] History and Physical  Linda [name] GMW:[id number] DOB: June 27, 1930 DOA: 07/10/2014  Referring physician: Emergency Department PCP: Leonides Sake, MD  Specialists:   Chief Complaint: SOB  HPI: Linda [name] is a 80 y.o. female  With a hx of dementia, DM, HTN, and recently treated UTI who presents to the ED with witnessed staring spells associated with hypoxia that started 2 days prior to admission. Pt is currently under the care of her daughter. While at home, pt had 3 episodes described as "staring into space" lasting several minutes and eventually self-resolving. After each episode, the patient was noted to "fall asleep," eventually becoming more alert after 10-15 min. Also, during these episodes, pt was noted to have O2 sats as low as the 50's. In the ED, initial head CT was unremarkable. Pt was started on empiric vanc and zosyn to cover PNA given recent hypoxia. Neurology was consulted and over the phone w/ ED, recommendation was made for transfer to [facility] for further neurologic work up. Hospitalist consulted for admission.  Review of Systems:  Per above, the remainder of the 10pt ros reviewed and are neg  Past Medical History  Diagnosis Date  . Allergy     allergic rhinitis  . Diabetes mellitus     type II  . Hypertension   . Osteopenia   . TIA (transient ischemic attack)   . Contact dermatitis     on buttocks  . Nosebleed   . Anxiety   . Renal insufficiency   . Pyelonephritis   . Dementia   . Chiari malformation    Past Surgical History  Procedure Laterality Date  . Rotator cuff repair  06/2007    right  . Abdominal hysterectomy    . Back surgery  03/1999    disk  . Breast surgery      breast biopsy x2 negative  . Shoulder surgery  2007    left  . Joint replacement      Rt knee patella replacement  . Colonoscopy  08/28/2012    Procedure: COLONOSCOPY;  Surgeon: Beryle Beams, MD;  Location: [facility] ENDOSCOPY;  Service: Endoscopy;  Laterality: N/A;    Social History:  reports that she has never smoked. She has never used smokeless tobacco. She reports that she does not drink alcohol or use illicit drugs.  where does patient live--home, ALF, SNF? and with whom if at home?  Can patient participate in ADLs?  Allergies  Allergen Reactions  . Amoxicillin Other (See Comments)    REACTION: aching all over  . Atorvastatin Other (See Comments)    REACTION: increased CPK  . Cephalexin Nausea Only and Other (See Comments)    Weak, Headache  . Cetirizine Hcl Other (See Comments)    REACTION: makes her head feel big  . Cortisone Other (See Comments)    REACTION: increased bp  . Ezetimibe-Simvastatin Other (See Comments)    unknown  . Fentanyl Other (See Comments)    REACTION: stinging in her skin  . Guaifenesin Other (See Comments)    unknown  . Ketorolac Tromethamine     REACTION: no work  . Lisinopril Cough    ACE related  . Metformin Other (See Comments)    epistaxis  . Nsaids Other (See Comments)    epistaxis  . Penicillins Nausea Only and Other (See Comments)    headache  . Rofecoxib Itching  . Rosuvastatin Other (See Comments)    Myalgias  . Simvastatin Other (See Comments)  unknown  . Spironolactone Nausea Only  . Sulfamethoxazole-Tmp Ds Other (See Comments)    unknown  . Codeine Rash  . Tape Rash    Family History  Problem Relation Age of Onset  . Stroke Mother   . Stroke Father     (be sure to complete)  Prior to Admission medications   Medication Sig Start Date End Date Taking? Authorizing Provider  acetaminophen (TYLENOL) 325 MG tablet Take 2 tablets (650 mg total) by mouth every 6 (six) hours as needed for mild pain (or Fever >/= 101). 04/14/14   Bonnielee Haff, MD  alendronate (FOSAMAX) 70 MG tablet Take 70 mg by mouth every 7 (seven) days. Saturday. Take with a full glass of water on an empty stomach.    Historical Provider, MD  amLODipine (NORVASC) 5 MG tablet Take 1 tablet (5 mg total) by mouth daily.  04/14/14   Bonnielee Haff, MD  donepezil (ARICEPT) 10 MG tablet Take 20 mg by mouth at bedtime.     Historical Provider, MD  escitalopram (LEXAPRO) 20 MG tablet Take 20 mg by mouth daily. For nerves    Historical Provider, MD  feeding supplement, GLUCERNA SHAKE, (GLUCERNA SHAKE) LIQD Take 237 mLs by mouth 3 (three) times daily between meals. 04/14/14   Bonnielee Haff, MD  nystatin (MYCOSTATIN) powder Apply topically daily. Apply under breasts and under stomach fold.    Historical Provider, MD  ondansetron (ZOFRAN) 4 MG tablet Take 4 mg by mouth every 8 (eight) hours as needed for nausea or vomiting.    Historical Provider, MD  oxyCODONE-acetaminophen (PERCOCET/ROXICET) 5-325 MG per tablet Take 1-2 tablets by mouth every 8 (eight) hours as needed for severe pain. 04/14/14   Bonnielee Haff, MD  risperiDONE (RISPERDAL) 0.5 MG tablet Take 2 tablets (1 mg total) by mouth 2 (two) times daily. 04/14/14   Bonnielee Haff, MD  senna (SENOKOT) 8.6 MG TABS tablet Take 1 tablet by mouth daily as needed for mild constipation.    Historical Provider, MD  traZODone (DESYREL) 50 MG tablet Take 1 tablet (50 mg total) by mouth at bedtime. 04/14/14   Bonnielee Haff, MD   Physical Exam: Filed Vitals:   07/10/14 1134 07/10/14 1200 07/10/14 1430 07/10/14 1659  BP:  148/63 159/76 183/76  Pulse:  52 71 74  Temp: 98 F (36.7 C)   98.3 F (36.8 C)  TempSrc: Oral   Oral  Resp:  20  20  SpO2:  97% 98% 95%     General:  Awake, in nad  Eyes: PERRL B  ENT: membranes moist, dentition fair  Neck: trachea midline, neck supple  Cardiovascular: regular, s1, s2  Respiratory: normal resp effort, no wheezing  Abdomen: soft, nondistended, nontender  Skin: normal skin turgor, no abnormal skin lesions seen  Musculoskeletal: perfused, no clubbing  Psychiatric: mood/affect normal// no auditory/visual hallucinations  Neurologic: cn2-12 grossly intact/strength/sensation intact  Labs on Admission:  Basic Metabolic  Panel:  Recent Labs Lab 07/10/14 1234  NA 141  K 4.2  CL 101  CO2 27  GLUCOSE 116*  BUN 18  CREATININE 1.07  CALCIUM 9.7   Liver Function Tests:  Recent Labs Lab 07/10/14 1234  AST 13  ALT 8  ALKPHOS 46  BILITOT 0.4  PROT 6.6  ALBUMIN 3.5   No results found for this basename: LIPASE, AMYLASE,  in the last 168 hours No results found for this basename: AMMONIA,  in the last 168 hours CBC:  Recent Labs Lab 07/10/14 1234  WBC  7.6  NEUTROABS 5.5  HGB 11.9*  HCT 37.3  MCV 90.1  PLT 221   Cardiac Enzymes:  Recent Labs Lab 07/10/14 1234  TROPONINI <0.30    BNP (last 3 results)  Recent Labs  04/10/14 1855  PROBNP 239.0   CBG: No results found for this basename: GLUCAP,  in the last 168 hours  Radiological Exams on Admission: Dg Chest 2 View  07/10/2014   CLINICAL DATA:  Low oxygen sats.  Fatigue and weakness.  EXAM: CHEST  2 VIEW  COMPARISON:  04/10/2014.  FINDINGS: Two view exam shows no evidence for focal airspace consolidation. No airspace pulmonary edema. Mild vascular congestion noted. Interstitial markings are diffusely coarsened with chronic features. The cardio pericardial silhouette is enlarged. There is bibasilar atelectasis or infection. Imaged bony structures of the thorax are intact. Telemetry leads overlie the chest.  IMPRESSION: Low volume film with patchy opacity at the bases, probably atelectatic although basilar pneumonia cannot be completely excluded.   Electronically Signed   By: Misty [name] M.D.   On: 07/10/2014 13:22   Ct Head Wo Contrast  07/10/2014   CLINICAL DATA:  Mental status changes.  EXAM: CT HEAD WITHOUT CONTRAST  TECHNIQUE: Contiguous axial images were obtained from the base of the skull through the vertex without intravenous contrast.  COMPARISON:  04/10/2014.  FINDINGS: There is no evidence for acute hemorrhage, hydrocephalus, or abnormal extra-axial fluid collection. No definite CT evidence for acute infarction. Patchy low  attenuation in the deep hemispheric and periventricular white matter is nonspecific, but likely reflects chronic microvascular ischemic demyelination. Small focus of dural calcification or meningioma identified in the left frontal region as noted on previous studies. This is stable in the interval without mass-effect or edema. The visualized paranasal sinuses and mastoid air cells are clear.  IMPRESSION: Stable exam. Chronic small vessel white matter ischemic disease with small dural calcification versus meningioma in the convexity. No new or acute interval findings.   Electronically Signed   By: Misty [name] M.D.   On: 07/10/2014 13:26    Assessment/Plan Principal Problem:   Hypoxia Active Problems:   DIABETES MELLITUS, TYPE II   OBESITY   HYPERTENSION   Dementia   1. Hypoxia with staring spells 1. Above symptoms are suggestive of new seizures 2. Neurology has been consulted through the ED with initial recommendations for transfer to [facility] for further neurologic w/u 3. Will order MRI brain and EEG 4. Will cont on PRN ativan for seizures 5. Will defer AED's to Neurology 6. Per Neuro, will admit to [facility], tele bed 7. CXR reviewed with atelectasis. Hold further vanc and cont on zosyn alone (see below) 2. DM II 1. Cont on SSI coverage 2. Carb modified diet 3. Obesity 1. Stable 4. HTN 1. Stable currently  2. Cont amlodipine per home regimen 5. Dementia 1. Appears stable 2. Cont Aricept and Risperdal per home meds 6. DVT prophylaxis 1. Heparin subQ 7. Suspected UTI 1. No leukocytosis, no fevers 2. Urine cx obtained in ED, follow results 3. For now, will cont zosyn alone  Code Status: Full (must indicate code status--if unknown or must be presumed, indicate so) Family Communication: Pt in room, daughter at bedside Disposition Plan: Home when stable  Time spent: 85min  [facility], [facility] Pager [phone]  If 7PM-7AM, please contact  night-coverage [email or web address] Password [email or web address] 07/10/2014, 5:01 PM

## 2014-07-10 NOTE — ED Notes (Signed)
Per EMS- Patient's family reported that the patient is more lethargic. PT therapist called EMS because during therapy this AM sats dropped to the mid 50's. Patient was placed on O2 4L./min via [place] and sats increased to 97%. Patient recently hospitalized 2 weeks ago for UTI.

## 2014-07-10 NOTE — ED Notes (Signed)
Bed: WA02 Expected date:  Expected time:  Means of arrival:  Comments: EMS-lethergy

## 2014-07-10 NOTE — Consult Note (Signed)
NEURO HOSPITALIST CONSULT NOTE    Reason for Consult: new onset spells of decreased responsiveness   HPI:                                                                                                                                          Linda [name] is an 80 y.o. female with a past medical history significant for HTN, DM, TIA, dementia alzheimer's type, recently treated UTI, transferred to [facility] for further evaluation spells with a pattern that I am going to describe below. Patient's daughter is at the bedside. Linda [name] was initially evaluated at [place]. Patient is not able to reliably contribute to her medical history, and thus all information is obtained from her daughter and chart review. According to patient's daughter, since yesterday Linda [name] had sustained 3 episodes with a very similar pattern, characterized by " staring into the space, a fixed and glazed stare, completely still and not able to speak or follow any directions". Stated that these episodes are associated with low oxygenation that can go as low as 50's, and last for few minutes. Afterwards, she has not recollection of the events and then goes to sleep for 10 to minutes. Never had similar spells before. CT brain today without evidence of acute intracranial abnormality. UA reveals evidence of infection. Electrolytes and CBC unimpressive. Patient denies HA, vertigo, double vision, focal weakness or numbness, slurred speech, language or vision impairment.  Past Medical History  Diagnosis Date  . Allergy     allergic rhinitis  . Diabetes mellitus     type II  . Hypertension   . Osteopenia   . TIA (transient ischemic attack)   . Contact dermatitis     on buttocks  . Nosebleed   . Anxiety   . Renal insufficiency   . Pyelonephritis   . Dementia   . Chiari malformation     Past Surgical History  Procedure Laterality Date  . Rotator cuff repair  06/2007    right  . Abdominal hysterectomy     . Back surgery  03/1999    disk  . Breast surgery      breast biopsy x2 negative  . Shoulder surgery  2007    left  . Joint replacement      Rt knee patella replacement  . Colonoscopy  08/28/2012    Procedure: COLONOSCOPY;  Surgeon: Beryle Beams, MD;  Location: [facility] ENDOSCOPY;  Service: Endoscopy;  Laterality: N/A;    Family History  Problem Relation Age of Onset  . Stroke Mother   . Stroke Father     Social History:  reports that she has never smoked. She has never used smokeless tobacco. She reports that she does not drink alcohol or use illicit drugs.  Allergies  Allergen Reactions  . Amoxicillin Other (See Comments)    REACTION: aching all over  . Atorvastatin Other (See Comments)    REACTION: increased CPK  . Cephalexin Nausea Only and Other (See Comments)    Weak, Headache  . Cetirizine Hcl Other (See Comments)    REACTION: makes her head feel big  . Cortisone Other (See Comments)    REACTION: increased bp  . Ezetimibe-Simvastatin Other (See Comments)    unknown  . Fentanyl Other (See Comments)    REACTION: stinging in her skin  . Guaifenesin Other (See Comments)    unknown  . Ketorolac Tromethamine     REACTION: no work  . Lisinopril Cough    ACE related  . Metformin Other (See Comments)    epistaxis  . Nsaids Other (See Comments)    epistaxis  . Penicillins Nausea Only and Other (See Comments)    headache  . Rofecoxib Itching  . Rosuvastatin Other (See Comments)    Myalgias  . Simvastatin Other (See Comments)    unknown  . Spironolactone Nausea Only  . Sulfamethoxazole-Tmp Ds Other (See Comments)    unknown  . Codeine Rash  . Tape Rash    MEDICATIONS:                                                                                                                     Scheduled: . donepezil  20 mg Oral QHS  . escitalopram  20 mg Oral Daily  . feeding supplement (GLUCERNA SHAKE)  237 mL Oral TID BM  . heparin  5,000 Units Subcutaneous 3 times per  day  . insulin aspart  0-15 Units Subcutaneous TID WC  . insulin aspart  0-5 Units Subcutaneous QHS  . [START ON 07/11/2014] piperacillin-tazobactam (ZOSYN)  IV  3.375 g Intravenous Q8H  . risperiDONE  1 mg Oral BID  . sodium chloride  3 mL Intravenous Q12H     ROS:                                                                                                                                       History obtained from chart review and daughter  General ROS: negative for - chills, fever, night sweats, or weight loss Psychological ROS: negative for - behavioral disorder, mood swings or suicidal ideation Ophthalmic ROS: negative for - blurry vision, double vision, eye pain or loss of vision  ENT ROS: negative for - epistaxis, nasal discharge, oral lesions, sore throat, tinnitus or vertigo Allergy and Immunology ROS: negative for - hives or itchy/watery eyes Hematological and Lymphatic ROS: negative for - bleeding problems, bruising or swollen lymph nodes Endocrine ROS: negative for - galactorrhea, hair pattern changes, polydipsia/polyuria or temperature intolerance Respiratory ROS: negative for - cough, hemoptysis, shortness of breath or wheezing Cardiovascular ROS: negative for - chest pain, dyspnea on exertion, edema or irregular heartbeat Gastrointestinal ROS: negative for - abdominal pain, diarrhea, hematemesis, nausea/vomiting or stool incontinence Genito-Urinary ROS: negative for - dysuria, hematuria, incontinence or urinary frequency/urgency Musculoskeletal ROS: negative for - joint swelling or muscular weakness Neurological ROS: as noted in HPI Dermatological ROS: negative for rash and skin lesion changes   Physical exam: pleasant female in no apparent distress. Blood pressure 141/65, pulse 56, temperature 98 F (36.7 C), temperature source Oral, resp. rate 18, height 5' 5.5" (1.664 m), weight 98.431 kg (217 lb), SpO2 98.00%. Head: normocephalic. Neck: supple, no bruits, no  JVD. Cardiac: no murmurs. Lungs: clear. Abdomen: soft, no tender, no mass. Extremities: no edema.  Neurologic Examination:                                                                                                      General: Mental Status: Alert, oriented to person.  Speech fluent without evidence of aphasia.  Able to follow 3 step commands without difficulty. Cranial Nerves: II: Discs flat bilaterally; Visual fields grossly normal, pupils equal, round, reactive to light and accommodation III,IV, VI: ptosis not present, extra-ocular motions intact bilaterally V,VII: smile symmetric, facial light touch sensation normal bilaterally VIII: hearing normal bilaterally IX,X: gag reflex present XI: bilateral shoulder shrug XII: midline tongue extension without atrophy or fasciculations Motor: Moves all limbs spontaneously and symmetrically. Tone and bulk normal. Sensory: Pinprick and light touch intact unreliable. Deep Tendon Reflexes:  1 all over Plantars: Right: downgoing   Left: downgoing Cerebellar: No tested Gait:  No tested  Lab Results  Component Value Date/Time   CHOL 226* 07/29/2010  9:59 AM    Results for orders placed during the hospital encounter of 07/10/14 (from the past 48 hour(s))  CBC WITH DIFFERENTIAL     Status: Abnormal   Collection Time    07/10/14 12:34 PM      Result Value Ref Range   WBC 7.6  4.0 - 10.5 K/uL   RBC 4.14  3.87 - 5.11 MIL/uL   Hemoglobin 11.9 (*) 12.0 - 15.0 g/dL   HCT 37.3  36.0 - 46.0 %   MCV 90.1  78.0 - 100.0 fL   MCH 28.7  26.0 - 34.0 pg   MCHC 31.9  30.0 - 36.0 g/dL   RDW 13.6  11.5 - 15.5 %   Platelets 221  150 - 400 K/uL   Neutrophils Relative % 72  43 - 77 %   Neutro Abs 5.5  1.7 - 7.7 K/uL   Lymphocytes Relative 21  12 - 46 %   Lymphs Abs 1.6  0.7 - 4.0 K/uL   Monocytes Relative 5  3 - 12 %   Monocytes Absolute 0.4  0.1 - 1.0 K/uL   Eosinophils Relative 2  0 - 5 %   Eosinophils Absolute 0.2  0.0 - 0.7 K/uL    Basophils Relative 0  0 - 1 %   Basophils Absolute 0.0  0.0 - 0.1 K/uL  COMPREHENSIVE METABOLIC PANEL     Status: Abnormal   Collection Time    07/10/14 12:34 PM      Result Value Ref Range   Sodium 141  137 - 147 mEq/L   Potassium 4.2  3.7 - 5.3 mEq/L   Chloride 101  96 - 112 mEq/L   CO2 27  19 - 32 mEq/L   Glucose, Bld 116 (*) 70 - 99 mg/dL   BUN 18  6 - 23 mg/dL   Creatinine, Ser 1.07  0.50 - 1.10 mg/dL   Calcium 9.7  8.4 - 10.5 mg/dL   Total Protein 6.6  6.0 - 8.3 g/dL   Albumin 3.5  3.5 - 5.2 g/dL   AST 13  0 - 37 U/L   ALT 8  0 - 35 U/L   Alkaline Phosphatase 46  39 - 117 U/L   Total Bilirubin 0.4  0.3 - 1.2 mg/dL   GFR calc non Af Amer 47 (*) >90 mL/min   GFR calc Af Amer 54 (*) >90 mL/min   Comment: (NOTE)     The eGFR has been calculated using the CKD EPI equation.     This calculation has not been validated in all clinical situations.     eGFR's persistently <90 mL/min signify possible Chronic Kidney     Disease.   Anion gap 13  5 - 15  TROPONIN I     Status: None   Collection Time    07/10/14 12:34 PM      Result Value Ref Range   Troponin I <0.30  <0.30 ng/mL   Comment:            Due to the release kinetics of cTnI,     a negative result within the first hours     of the onset of symptoms does not rule out     myocardial infarction with certainty.     If myocardial infarction is still suspected,     repeat the test at appropriate intervals.  LACTIC ACID, PLASMA     Status: None   Collection Time    07/10/14 12:35 PM      Result Value Ref Range   Lactic Acid, Venous 1.0  0.5 - 2.2 mmol/L  URINALYSIS, ROUTINE W REFLEX MICROSCOPIC     Status: Abnormal   Collection Time    07/10/14 12:49 PM      Result Value Ref Range   Color, Urine YELLOW  YELLOW   APPearance CLOUDY (*) CLEAR   Specific Gravity, Urine 1.019  1.005 - 1.030   pH 5.5  5.0 - 8.0   Glucose, UA NEGATIVE  NEGATIVE mg/dL   Hgb urine dipstick LARGE (*) NEGATIVE   Bilirubin Urine NEGATIVE   NEGATIVE   Ketones, ur NEGATIVE  NEGATIVE mg/dL   Protein, ur NEGATIVE  NEGATIVE mg/dL   Urobilinogen, UA 1.0  0.0 - 1.0 mg/dL   Nitrite NEGATIVE  NEGATIVE   Leukocytes, UA MODERATE (*) NEGATIVE  URINE MICROSCOPIC-ADD ON     Status: Abnormal   Collection Time    07/10/14 12:49 PM      Result Value Ref Range   Squamous  Epithelial / LPF RARE  RARE   WBC, UA 21-50  <3 WBC/hpf   RBC / HPF 11-20  <3 RBC/hpf   Bacteria, UA FEW (*) RARE  CBG MONITORING, ED     Status: Abnormal   Collection Time    07/10/14  5:14 PM      Result Value Ref Range   Glucose-Capillary 121 (*) 70 - 99 mg/dL  GLUCOSE, CAPILLARY     Status: Abnormal   Collection Time    07/10/14  8:57 PM      Result Value Ref Range   Glucose-Capillary 131 (*) 70 - 99 mg/dL    Dg Chest 2 View  07/10/2014   CLINICAL DATA:  Low oxygen sats.  Fatigue and weakness.  EXAM: CHEST  2 VIEW  COMPARISON:  04/10/2014.  FINDINGS: Two view exam shows no evidence for focal airspace consolidation. No airspace pulmonary edema. Mild vascular congestion noted. Interstitial markings are diffusely coarsened with chronic features. The cardio pericardial silhouette is enlarged. There is bibasilar atelectasis or infection. Imaged bony structures of the thorax are intact. Telemetry leads overlie the chest.  IMPRESSION: Low volume film with patchy opacity at the bases, probably atelectatic although basilar pneumonia cannot be completely excluded.   Electronically Signed   By: Misty [name] M.D.   On: 07/10/2014 13:22   Ct Head Wo Contrast  07/10/2014   CLINICAL DATA:  Mental status changes.  EXAM: CT HEAD WITHOUT CONTRAST  TECHNIQUE: Contiguous axial images were obtained from the base of the skull through the vertex without intravenous contrast.  COMPARISON:  04/10/2014.  FINDINGS: There is no evidence for acute hemorrhage, hydrocephalus, or abnormal extra-axial fluid collection. No definite CT evidence for acute infarction. Patchy low attenuation in the deep  hemispheric and periventricular white matter is nonspecific, but likely reflects chronic microvascular ischemic demyelination. Small focus of dural calcification or meningioma identified in the left frontal region as noted on previous studies. This is stable in the interval without mass-effect or edema. The visualized paranasal sinuses and mastoid air cells are clear.  IMPRESSION: Stable exam. Chronic small vessel white matter ischemic disease with small dural calcification versus meningioma in the convexity. No new or acute interval findings.   Electronically Signed   By: Misty [name] M.D.   On: 07/10/2014 13:26   Assessment/Plan: 80 y/o with new onset stereotypic, recurrent staring spells associated with impairment of consciousness, inability to speak or follow commands. Reportedly with low oxygenation during the spells. She has dementia which predisposes her to suffer partial complex seizures, but can not entirely exclude the possibility of hypoxemia as the culprit for such paroxysmal events. MRI brain already requested. Will also ask for EEG in am. Will follow up.  Dorian Pod, MD 07/10/2014, 9:29 PM

## 2014-07-10 NOTE — Progress Notes (Signed)
ANTIBIOTIC CONSULT NOTE - INITIAL  Pharmacy Consult for Zosyn Indication: aspiration pneumonia  Allergies  Allergen Reactions  . Amoxicillin Other (See Comments)    REACTION: aching all over  . Atorvastatin Other (See Comments)    REACTION: increased CPK  . Cephalexin Nausea Only and Other (See Comments)    Weak, Headache  . Cetirizine Hcl Other (See Comments)    REACTION: makes her head feel big  . Cortisone Other (See Comments)    REACTION: increased bp  . Ezetimibe-Simvastatin Other (See Comments)    unknown  . Fentanyl Other (See Comments)    REACTION: stinging in her skin  . Guaifenesin Other (See Comments)    unknown  . Ketorolac Tromethamine     REACTION: no work  . Lisinopril Cough    ACE related  . Metformin Other (See Comments)    epistaxis  . Nsaids Other (See Comments)    epistaxis  . Penicillins Nausea Only and Other (See Comments)    headache  . Rofecoxib Itching  . Rosuvastatin Other (See Comments)    Myalgias  . Simvastatin Other (See Comments)    unknown  . Spironolactone Nausea Only  . Sulfamethoxazole-Tmp Ds Other (See Comments)    unknown  . Codeine Rash  . Tape Rash    Patient Measurements:     Vital Signs: Temp: 98 F (36.7 C) (08/31 1134) Temp src: Oral (08/31 1134) BP: 159/76 mmHg (08/31 1430) Pulse Rate: 71 (08/31 1430) Intake/Output from previous day:   Intake/Output from this shift: Total I/O In: -  Out: 100 [Urine:100]  Labs:  Recent Labs  07/10/14 1234  WBC 7.6  HGB 11.9*  PLT 221  CREATININE 1.07   The CrCl is unknown because both a height and weight (above a minimum accepted value) are required for this calculation. No results found for this basename: VANCOTROUGH, VANCOPEAK, VANCORANDOM, GENTTROUGH, GENTPEAK, GENTRANDOM, TOBRATROUGH, TOBRAPEAK, TOBRARND, AMIKACINPEAK, AMIKACINTROU, AMIKACIN,  in the last 72 hours   Microbiology: No results found for this or any previous visit (from the past 720  hour(s)).  Medical History: Past Medical History  Diagnosis Date  . Allergy     allergic rhinitis  . Diabetes mellitus     type II  . Hypertension   . Osteopenia   . TIA (transient ischemic attack)   . Contact dermatitis     on buttocks  . Nosebleed   . Anxiety   . Renal insufficiency   . Pyelonephritis   . Dementia   . Chiari malformation      Assessment: 62 yoF presents with lethargy and hypoxia.  CXR cannot exclude pneumonia.  Pharmacy consulted to dose Zosyn for aspiration pneumonia.  Zosyn 3.375g x 1 given and Vancomycin 1g x 1 ordered in ED.  Tmax: currently afebrile WBC: WNL Renal: SCr 1.07, CrCl~ 66 ml/min (CG), ~45 ml/min (normalized) Urine culture collected.  Goal of Therapy:  Doses adjusted per renal function Eradication of infection  Plan:  1.  Zosyn 3.375g IV q8h (4 hour infusion time). 2.  F/u SCr, culture results, clinical course.  Hershal Coria 07/10/2014,4:45 PM

## 2014-07-10 NOTE — Progress Notes (Signed)
  CARE MANAGEMENT ED NOTE 07/10/2014  Patient:  KIMANI, BEDOYA   Account Number:  [id number]  Date Initiated:  07/10/2014  Documentation initiated by:  Livia Snellen  Subjective/Objective Assessment:   Patient presents to Ed with increased lethargy.  Oxygen sats at hoem dropped to 50's.  Patient placed on O2 4 liters     Subjective/Objective Assessment Detail:   Patient with pmhx DM, HTN, TIA, Renal insufficiency, dementia     Action/Plan:   Action/Plan Detail:   Anticipated DC Date:       Status Recommendation to Physician:   Result of Recommendation:    Other ED Services  Consult [address]  Other    Choice offered to / List presented to:  [facility].    Status of service:  Completed, signed off  ED Comments:   ED Comments Detail:  EDCM spoke to patient and her daughter Silvestre Mesi at bedside.  Patient lives at home with her daughter [name]. Patient's daughter reports that patient is currently receiving home health services with [facility] for PT.  Patient also has a family member who comes to the home five days a week from 11am -4pm to assist patient with ADL's etc. Patient's daughter reports the patient has a walker, cane, wheelchair, bedside commode and safety rails around the toilet at home to help her get up and down.  Patient's daughter reports patient is receiving her oxygen from [facility], however they are paying for it out of pocket because, "the insurance company isn't paying for it anymore because she (the patient) doesn't have a "chronic illness." Patient's daughter confirms patient's pcp is Dr. Fenton Malling   at [facility] in [place].  EDCM questioned patient's daughter as to what their plan was for discharge? (Home or SNF).  Patient's daughter [name] reports she would have to discuss it with her sister [name] who is the patient's POA, but "I would like her to come home" per [name].  EDCM  provided patient's daughter with list of home health agencies in [place] highlighting [facility].  Patient and patient's daughter thankful for services.  No further EDCM needs at this time.

## 2014-07-11 ENCOUNTER — Inpatient Hospital Stay (HOSPITAL_COMMUNITY): Payer: Medicare Other

## 2014-07-11 DIAGNOSIS — I119 Hypertensive heart disease without heart failure: Secondary | ICD-10-CM

## 2014-07-11 DIAGNOSIS — N179 Acute kidney failure, unspecified: Secondary | ICD-10-CM

## 2014-07-11 DIAGNOSIS — N390 Urinary tract infection, site not specified: Secondary | ICD-10-CM

## 2014-07-11 DIAGNOSIS — G9340 Encephalopathy, unspecified: Secondary | ICD-10-CM

## 2014-07-11 DIAGNOSIS — J309 Allergic rhinitis, unspecified: Secondary | ICD-10-CM

## 2014-07-11 DIAGNOSIS — N189 Chronic kidney disease, unspecified: Secondary | ICD-10-CM

## 2014-07-11 LAB — GLUCOSE, CAPILLARY
GLUCOSE-CAPILLARY: 113 mg/dL — AB (ref 70–99)
GLUCOSE-CAPILLARY: 162 mg/dL — AB (ref 70–99)
Glucose-Capillary: 97 mg/dL (ref 70–99)
Glucose-Capillary: 150 mg/dL — ABNORMAL HIGH (ref 70–99)

## 2014-07-11 MED ORDER — LEVETIRACETAM 500 MG PO TABS
500.0000 mg | ORAL_TABLET | Freq: Two times a day (BID) | ORAL | Status: DC
  Administered 2014-07-11 – 2014-07-12 (×2): 500 mg via ORAL
  Filled 2014-07-11 (×4): qty 1

## 2014-07-11 MED ORDER — FUROSEMIDE 10 MG/ML IJ SOLN
40.0000 mg | Freq: Once | INTRAMUSCULAR | Status: AC
  Administered 2014-07-11: 40 mg via INTRAVENOUS
  Filled 2014-07-11: qty 4

## 2014-07-11 NOTE — Progress Notes (Addendum)
Subjective: Patient has had no further episodes over night.  Daughter states patient was on 2L oxygen at [facility] when in rehab July 2015.  While in oxygen she has more energy.  Currently on 2L oxygen [place]. O2 sats remained in upper 90's.   Objective: Current vital signs: BP 130/44  Pulse 61  Temp(Src) 97.5 F (36.4 C) (Oral)  Resp 18  Ht 5' 5.5" (1.664 m)  Wt 98.4 kg (216 lb 14.9 oz)  BMI 35.54 kg/m2  SpO2 96% Vital signs in last 24 hours: Temp:  [97.5 F (36.4 C)-98.3 F (36.8 C)] 97.5 F (36.4 C) (09/01 0447) Pulse Rate:  [52-74] 61 (09/01 0447) Resp:  [16-20] 18 (09/01 0447) BP: (130-183)/(44-76) 130/44 mmHg (09/01 0447) SpO2:  [95 %-98 %] 96 % (09/01 0447) Weight:  [98.4 kg (216 lb 14.9 oz)-98.431 kg (217 lb)] 98.4 kg (216 lb 14.9 oz) (09/01 0447)  Intake/Output from previous day: 08/31 0701 - 09/01 0700 In: 175 [P.O.:125; IV Piggyback:50] Out: 600 [Urine:600] Intake/Output this shift: Total I/O In: -  Out: 150 [Urine:150] Nutritional status: Carb Control  Neurologic Exam: General: NAD Mental Status: drowsy, not oriented to place, year or month (baseline per daughter).  Speech fluent without evidence of aphasia.  Able to follow 3 step commands without difficulty. Cranial Nerves: II: Visual fields grossly normal, pupils equal, round, reactive to light and accommodation III,IV, VI: ptosis not present, extra-ocular motions intact bilaterally V,VII: smile symmetric, facial light touch sensation normal bilaterally VIII: hearing normal bilaterally IX,X: gag reflex present XI: bilateral shoulder shrug XII: midline tongue extension  Motor: Right : Upper extremity   5/5    Left:     Upper extremity   5/5  Lower extremity   4-/5 (baseline)   Lower extremity   4-/5 (baseline) Tone and bulk:normal tone throughout; no atrophy noted Sensory: Pinprick and light touch intact throughout, bilaterally Deep Tendon Reflexes:  Right: Upper Extremity   Left: Upper extremity   biceps  (C-5 to C-6) 1/4   biceps (C-5 to C-6) 1/4 tricep (C7) 1/4    triceps (C7) 1/4 Brachioradialis (C6) 1/4  Brachioradialis (C6) 1/4  Lower Extremity Lower Extremity  quadriceps (L-2 to L-4) 0/4   quadriceps (L-2 to L-4) 0/4 Achilles (S1) 0/4   Achilles (S1) 0/4  Plantars: Right: downgoing   Left: downgoing    Lab Results: Basic Metabolic Panel:  Recent Labs Lab 07/10/14 1234  NA 141  K 4.2  CL 101  CO2 27  GLUCOSE 116*  BUN 18  CREATININE 1.07  CALCIUM 9.7    Liver Function Tests:  Recent Labs Lab 07/10/14 1234  AST 13  ALT 8  ALKPHOS 46  BILITOT 0.4  PROT 6.6  ALBUMIN 3.5   No results found for this basename: LIPASE, AMYLASE,  in the last 168 hours No results found for this basename: AMMONIA,  in the last 168 hours  CBC:  Recent Labs Lab 07/10/14 1234  WBC 7.6  NEUTROABS 5.5  HGB 11.9*  HCT 37.3  MCV 90.1  PLT 221    Cardiac Enzymes:  Recent Labs Lab 07/10/14 1234  TROPONINI <0.30    Lipid Panel: No results found for this basename: CHOL, TRIG, HDL, CHOLHDL, VLDL, LDLCALC,  in the last 168 hours  CBG:  Recent Labs Lab 07/10/14 1714 07/10/14 2057 07/11/14 0939  GLUCAP 121* 131* 113*    Microbiology: Results for orders placed during the hospital encounter of 04/10/14  URINE CULTURE     Status: None  Collection Time    04/11/14 11:20 AM      Result Value Ref Range Status   Specimen Description URINE, CLEAN CATCH   Final   Special Requests NONE   Final   Culture  Setup Time     Final   Value: 04/11/2014 16:44     Performed at [facility]     Final   Value: NO GROWTH     Performed at [facility]   Culture     Final   Value: NO GROWTH     Performed at [facility]   Report Status 04/12/2014 FINAL   Final    Coagulation Studies: No results found for this basename: LABPROT, INR,  in the last 72 hours  Imaging: Dg Chest 2 View  07/10/2014   CLINICAL DATA:  Low oxygen sats.  Fatigue  and weakness.  EXAM: CHEST  2 VIEW  COMPARISON:  04/10/2014.  FINDINGS: Two view exam shows no evidence for focal airspace consolidation. No airspace pulmonary edema. Mild vascular congestion noted. Interstitial markings are diffusely coarsened with chronic features. The cardio pericardial silhouette is enlarged. There is bibasilar atelectasis or infection. Imaged bony structures of the thorax are intact. Telemetry leads overlie the chest.  IMPRESSION: Low volume film with patchy opacity at the bases, probably atelectatic although basilar pneumonia cannot be completely excluded.   Electronically Signed   By: Misty [name] M.D.   On: 07/10/2014 13:22   Ct Head Wo Contrast  07/10/2014   CLINICAL DATA:  Mental status changes.  EXAM: CT HEAD WITHOUT CONTRAST  TECHNIQUE: Contiguous axial images were obtained from the base of the skull through the vertex without intravenous contrast.  COMPARISON:  04/10/2014.  FINDINGS: There is no evidence for acute hemorrhage, hydrocephalus, or abnormal extra-axial fluid collection. No definite CT evidence for acute infarction. Patchy low attenuation in the deep hemispheric and periventricular white matter is nonspecific, but likely reflects chronic microvascular ischemic demyelination. Small focus of dural calcification or meningioma identified in the left frontal region as noted on previous studies. This is stable in the interval without mass-effect or edema. The visualized paranasal sinuses and mastoid air cells are clear.  IMPRESSION: Stable exam. Chronic small vessel white matter ischemic disease with small dural calcification versus meningioma in the convexity. No new or acute interval findings.   Electronically Signed   By: Misty [name] M.D.   On: 07/10/2014 13:26   Mr Brain Wo Contrast  07/11/2014   CLINICAL DATA:  80 year old female with dementia, diabetes and high blood pressure presenting with staring spells.  EXAM: MRI HEAD WITHOUT CONTRAST  TECHNIQUE: Multiplanar,  multiecho pulse sequences of the brain and surrounding structures were obtained without intravenous contrast.  COMPARISON:  07/10/2014 CT and 04/12/2014 MR.  FINDINGS: No acute infarct.  Mild small vessel disease type changes.  No intracranial hemorrhage.  Posterior left frontal extra-axial partially calcified 1.6 x 1.2 x 0.9 cm lesion has appearance suggestive of a small meningioma without surrounding vasogenic edema. Dural calcification as previously questioned is felt to be a secondary consideration.  No hydrocephalus.  Major intracranial vascular structures are patent.  Cerebellar tonsils are minimally low lying but within the range of normal limits.  Pituitary gland narrowed in a right to left direction but of normal size.  IMPRESSION: No acute abnormality.  Please see above   Electronically Signed   By: Chauncey Cruel M.D.   On: 07/11/2014 09:39    Medications:  Scheduled: . donepezil  20 mg Oral QHS  . escitalopram  20 mg Oral Daily  . feeding supplement (GLUCERNA SHAKE)  237 mL Oral TID BM  . heparin  5,000 Units Subcutaneous 3 times per day  . insulin aspart  0-15 Units Subcutaneous TID WC  . insulin aspart  0-5 Units Subcutaneous QHS  . piperacillin-tazobactam (ZOSYN)  IV  3.375 g Intravenous Q8H  . risperiDONE  1 mg Oral BID  . sodium chloride  3 mL Intravenous Q12H    Assessment/Plan: 80 y/o with new onset stereotypic, recurrent staring spells associated with impairment of consciousness, inability to speak or follow commands. Reportedly with low oxygenation saturation in the 70's during episodes. No further episodes while in hospital and currently on 2 L oxygen [place]. EEG has been done and pending final reading. MRI shows  posterior left frontal extra-axial partially calcified 1.6 x 1.2 x 0.9 cm lesion which has appearance suggestive of a small meningioma without surrounding vasogenic edema. MRI findings not related to presentation.    Recommend: 1) Continue oxygen 2) At this time would  not start AED unless EEG is abnormal   Etta Quill PA-C [facility] [phone]  07/11/2014, 10:52 AM   Addendum: EEG shows right frontal sharp activity.    Recommend: Keppra 500mg  BID  Alexis Goodell, MD [facility] [phone]

## 2014-07-11 NOTE — Progress Notes (Signed)
UR Completed Brendaliz Kuk Graves-Bigelow, RN,BSN [phone]  

## 2014-07-11 NOTE — Care Management Note (Addendum)
    Page 1 of 2   07/12/2014     10:48:44 AM CARE MANAGEMENT NOTE 07/12/2014  Patient:  Linda [name], Linda [name]   Account Number:  [id number]  Date Initiated:  07/11/2014  Documentation initiated by:  GRAVES-BIGELOW,Shandale Malak  Subjective/Objective Assessment:   Pt admitted as transfer from [facility] hospital for hypoxia. MRI (-). Pt is currently active with [facility] for [facility] services.     Action/Plan:   Pt will need resumption orders for HHPT services. CM will continue to monitor for disposition needs.   Anticipated DC Date:  07/12/2014   Anticipated DC Plan:  [place]  CM consult      [facility] Choice  Resumption Of Svcs/PTA Provider   Choice offered to / List presented to:  C-4 Adult Children        HH arranged  [place] RN      [place].   Status of service:  Completed, signed off Medicare Important Message given?  YES (If response is "NO", the following Medicare IM given date fields will be blank) Date Medicare IM given:  07/12/2014 Medicare IM given by:  GRAVES-BIGELOW,Jamale Spangler Date Additional Medicare IM given:   Additional Medicare IM given by:    Discharge Disposition:  [place]  Per UR Regulation:  Reviewed for med. necessity/level of care/duration of stay  If discussed at [place] of Stay Meetings, dates discussed:    Comments:  07-12-14 [place], [place] [phone] Referral made for [facility] services. SOC to begin within 24-48 hrs post d/c. Per pt's daughter pt has 02 via [facility] and family has to pay out of pocket. Pt with desaturation, however pt does not have a qualifying dx for insurance to pay for DME 02. CM did suggest pt ask PCP for overnight Sleep study in the home if insurance will pay for procedure. No further needs from CM at this time.

## 2014-07-11 NOTE — Procedures (Signed)
ELECTROENCEPHALOGRAM REPORT   Patient: Linda [name]       Room #: 5F29 EEG No. ID: [id number] Age: 80 y.o.        Sex: female Referring Physician: Coralyn Pear Report Date:  07/11/2014        Interpreting Physician: Alexis Goodell D  History: Linda [name] is an 80 y.o. female with starring episodes evaluated to rule out seizure  Medications:  Scheduled: . donepezil  20 mg Oral QHS  . escitalopram  20 mg Oral Daily  . feeding supplement (GLUCERNA SHAKE)  237 mL Oral TID BM  . heparin  5,000 Units Subcutaneous 3 times per day  . insulin aspart  0-15 Units Subcutaneous TID WC  . insulin aspart  0-5 Units Subcutaneous QHS  . piperacillin-tazobactam (ZOSYN)  IV  3.375 g Intravenous Q8H  . risperiDONE  1 mg Oral BID  . sodium chloride  3 mL Intravenous Q12H    Conditions of Recording:  This is a 16 channel EEG carried out with the patient in the awake, drowsy and asleep states.  Description:  The waking background activity consists of a low voltage, symmetrical, fairly well organized, 7 Hz alpha activity, seen from the parieto-occipital and posterior temporal regions.  Low voltage fast activity, poorly organized, is seen anteriorly and is at times superimposed on more posterior regions.  A mixture of theta and alpha rhythms are seen from the central and temporal regions. The patient drowses with slowing to irregular, low voltage theta and beta activity.   The patient goes in to a light sleep with symmetrical sleep spindles, vertex central sharp transients and irregular slow activity.   Intermittently there is noted occasional right frontal sharp transients with phase reversal at F8.   Hyperventilation was not performed.  Intermittent photic stimulation was performed but failed to illicit any change in the tracing.   IMPRESSION: This is an abnormal EEG secondary to posterior background slowing.  This finding may be seen with a diffuse gray matter disturbance that is etiologically nonspecific,  but may include a dementia, among other possibilities.  Also noted is sharp activity from the right frontal region with phase reversal at F8.       Alexis Goodell, MD [facility] [phone] 07/11/2014, 2:33 PM

## 2014-07-11 NOTE — Progress Notes (Signed)
[facility]  Patient Status: Active (receiving services up to time of hospitalization)  [facility] is providing the following services: PT  If patient discharges after hours, please call [phone].   Janae Sauce 07/11/2014, 3:22 PM

## 2014-07-11 NOTE — Progress Notes (Signed)
EEG completed, results pending. 

## 2014-07-11 NOTE — Progress Notes (Signed)
[facility] PROGRESS NOTE   Linda [name] IFO:[id number] DOB: Sep 20, 1930 DOA: 07/10/2014 PCP: Leonides Sake, MD  HPI/Subjective: Seen with her daughter at bedside, no episodes overnight.  Assessment/Plan: Principal Problem:   Hypoxia Active Problems:   DIABETES MELLITUS, TYPE II   OBESITY   HYPERTENSION   Dementia   Staring spells -Symptoms suggesting new episode of seizure. -MRI of the brain showed small meningioma 1.6x1.2x0.9 cm without vasogenic. No evidence of stroke. -Seen by neurology, EEG pending. -Per neurology notes AED will not be needed unless EEG is abnormal.  Hypoxia -Unclear etiology, chest x-ray with atelectasis. -Patient did not have more cough and usual, per daughter she is on and off oxygen in the nursing home. -Was given one dose of Lasix, keep intake/output at the negative balance side.  Suspected UTI -Patient did not have fever or leukocytosis. -And pelvis consistent with UTI, Zosyn continued.  Dementia -Patient did have confusion last night likely sundowning. -Discussed with her daughter at bedside., Await EEG results he should probably can be discharged in the morning.  Diabetes mellitus type 2 -Continue carbohydrate modified diet and insulin sliding scale coverage.  Code Status: Full code Family Communication: Plan discussed with the patient. Disposition Plan: Remains inpatient   Consultants:  Neurology  Procedures:  EEG, results pending  Antibiotics:  Zosyn   Objective: Filed Vitals:   07/11/14 0447  BP: 130/44  Pulse: 61  Temp: 97.5 F (36.4 C)  Resp: 18    Intake/Output Summary (Last 24 hours) at 07/11/14 1237 Last data filed at 07/11/14 0900  Gross per 24 hour  Intake    175 ml  Output    750 ml  Net   -575 ml   Filed Weights   07/10/14 2100 07/11/14 0447  Weight: 98.431 kg (217 lb) 98.4 kg (216 lb 14.9 oz)    Exam: General: Alert and awake, oriented x3, not in any acute distress. HEENT: anicteric  sclera, pupils reactive to light and accommodation, EOMI CVS: S1-S2 clear, no murmur rubs or gallops Chest: clear to auscultation bilaterally, no wheezing, rales or rhonchi Abdomen: soft nontender, nondistended, normal bowel sounds, no organomegaly Extremities: no cyanosis, clubbing or edema noted bilaterally Neuro: Cranial nerves II-XII intact, no focal neurological deficits  Data Reviewed: Basic Metabolic Panel:  Recent Labs Lab 07/10/14 1234  NA 141  K 4.2  CL 101  CO2 27  GLUCOSE 116*  BUN 18  CREATININE 1.07  CALCIUM 9.7   Liver Function Tests:  Recent Labs Lab 07/10/14 1234  AST 13  ALT 8  ALKPHOS 46  BILITOT 0.4  PROT 6.6  ALBUMIN 3.5   No results found for this basename: LIPASE, AMYLASE,  in the last 168 hours No results found for this basename: AMMONIA,  in the last 168 hours CBC:  Recent Labs Lab 07/10/14 1234  WBC 7.6  NEUTROABS 5.5  HGB 11.9*  HCT 37.3  MCV 90.1  PLT 221   Cardiac Enzymes:  Recent Labs Lab 07/10/14 1234  TROPONINI <0.30   BNP (last 3 results)  Recent Labs  04/10/14 1855  PROBNP 239.0   CBG:  Recent Labs Lab 07/10/14 1714 07/10/14 2057 07/11/14 0939 07/11/14 1144  GLUCAP 121* 131* 113* 162*    Micro No results found for this or any previous visit (from the past 240 hour(s)).   Studies: Dg Chest 2 View  07/10/2014   CLINICAL DATA:  Low oxygen sats.  Fatigue and weakness.  EXAM: CHEST  2 VIEW  COMPARISON:  04/10/2014.  FINDINGS: Two view exam shows no evidence for focal airspace consolidation. No airspace pulmonary edema. Mild vascular congestion noted. Interstitial markings are diffusely coarsened with chronic features. The cardio pericardial silhouette is enlarged. There is bibasilar atelectasis or infection. Imaged bony structures of the thorax are intact. Telemetry leads overlie the chest.  IMPRESSION: Low volume film with patchy opacity at the bases, probably atelectatic although basilar pneumonia cannot be  completely excluded.   Electronically Signed   By: Misty [name] M.D.   On: 07/10/2014 13:22   Ct Head Wo Contrast  07/10/2014   CLINICAL DATA:  Mental status changes.  EXAM: CT HEAD WITHOUT CONTRAST  TECHNIQUE: Contiguous axial images were obtained from the base of the skull through the vertex without intravenous contrast.  COMPARISON:  04/10/2014.  FINDINGS: There is no evidence for acute hemorrhage, hydrocephalus, or abnormal extra-axial fluid collection. No definite CT evidence for acute infarction. Patchy low attenuation in the deep hemispheric and periventricular white matter is nonspecific, but likely reflects chronic microvascular ischemic demyelination. Small focus of dural calcification or meningioma identified in the left frontal region as noted on previous studies. This is stable in the interval without mass-effect or edema. The visualized paranasal sinuses and mastoid air cells are clear.  IMPRESSION: Stable exam. Chronic small vessel white matter ischemic disease with small dural calcification versus meningioma in the convexity. No new or acute interval findings.   Electronically Signed   By: Misty [name] M.D.   On: 07/10/2014 13:26   Mr Brain Wo Contrast  07/11/2014   CLINICAL DATA:  80 year old female with dementia, diabetes and high blood pressure presenting with staring spells.  EXAM: MRI HEAD WITHOUT CONTRAST  TECHNIQUE: Multiplanar, multiecho pulse sequences of the brain and surrounding structures were obtained without intravenous contrast.  COMPARISON:  07/10/2014 CT and 04/12/2014 MR.  FINDINGS: No acute infarct.  Mild small vessel disease type changes.  No intracranial hemorrhage.  Posterior left frontal extra-axial partially calcified 1.6 x 1.2 x 0.9 cm lesion has appearance suggestive of a small meningioma without surrounding vasogenic edema. Dural calcification as previously questioned is felt to be a secondary consideration.  No hydrocephalus.  Major intracranial vascular structures  are patent.  Cerebellar tonsils are minimally low lying but within the range of normal limits.  Pituitary gland narrowed in a right to left direction but of normal size.  IMPRESSION: No acute abnormality.  Please see above   Electronically Signed   By: Chauncey Cruel M.D.   On: 07/11/2014 09:39    Scheduled Meds: . donepezil  20 mg Oral QHS  . escitalopram  20 mg Oral Daily  . feeding supplement (GLUCERNA SHAKE)  237 mL Oral TID BM  . heparin  5,000 Units Subcutaneous 3 times per day  . insulin aspart  0-15 Units Subcutaneous TID WC  . insulin aspart  0-5 Units Subcutaneous QHS  . piperacillin-tazobactam (ZOSYN)  IV  3.375 g Intravenous Q8H  . risperiDONE  1 mg Oral BID  . sodium chloride  3 mL Intravenous Q12H   Continuous Infusions:      Time spent: 35 minutes    [facility]  [facility] Pager [phone] If 7PM-7AM, please contact night-coverage at [email or web address], password [facility] 07/11/2014, 12:37 PM  LOS: 1 day

## 2014-07-12 DIAGNOSIS — F0390 Unspecified dementia without behavioral disturbance: Secondary | ICD-10-CM

## 2014-07-12 LAB — GLUCOSE, CAPILLARY
GLUCOSE-CAPILLARY: 103 mg/dL — AB (ref 70–99)
GLUCOSE-CAPILLARY: 153 mg/dL — AB (ref 70–99)

## 2014-07-12 LAB — BASIC METABOLIC PANEL
Anion gap: 13 (ref 5–15)
BUN: 21 mg/dL (ref 6–23)
CALCIUM: 8.7 mg/dL (ref 8.4–10.5)
CO2: 30 mEq/L (ref 19–32)
CREATININE: 1.53 mg/dL — AB (ref 0.50–1.10)
Chloride: 100 mEq/L (ref 96–112)
GFR, EST AFRICAN AMERICAN: 35 mL/min — AB (ref >90)
GFR, EST NON AFRICAN AMERICAN: 31 mL/min — AB (ref >90)
Glucose, Bld: 110 mg/dL — ABNORMAL HIGH (ref 70–99)
Potassium: 4 mEq/L (ref 3.7–5.3)
Sodium: 143 mEq/L (ref 137–147)

## 2014-07-12 MED ORDER — LEVETIRACETAM 500 MG PO TABS: 500.0000 mg | ORAL_TABLET | Freq: Two times a day (BID) | ORAL | Status: DC

## 2014-07-12 NOTE — Progress Notes (Signed)
[facility] PROGRESS NOTE  Assessment/Plan: Staring spell: - MRI as below. - EEG showed no eliptiform waves, Diffuse gray matter disturbance that is etiologically nonspecific - home today.  Hypoxia - Unclear etiology. - give single dose of lasix. - on oxygen at facility.  Dementia: - Patient did have confusion last night likely sundowning.  - EEG compatible with dementia.  HYPERTENSION - stable, cont meds.  DIABETES MELLITUS, TYPE II - good control.    Code Status: Full code  Family Communication: Plan discussed with the patient.  Disposition Plan: Remains inpatient    Consultants:  Neurology  Procedures: MRI brain: no acute CVA. posterior left frontal extra-axial partially calcified 1.6 x 1.2 x 0.9 cm lesion which has appearance suggestive of a small meningioma      Antibiotics:  None   HPI/Subjective: Sleepy.  Objective: Filed Vitals:   07/11/14 1439 07/11/14 2033 07/12/14 0020 07/12/14 0520  BP: 130/48 125/49 145/56 145/57  Pulse: 61 65 67 59  Temp: 98 F (36.7 C) 98.5 F (36.9 C) 98.9 F (37.2 C) 97.7 F (36.5 C)  TempSrc: Oral Oral Oral Oral  Resp: 16 18 18 18   Height:      Weight:    98.4 kg (216 lb 14.9 oz)  SpO2: 98% 94% 94% 95%    Intake/Output Summary (Last 24 hours) at 07/12/14 0813 Last data filed at 07/12/14 9983  Gross per 24 hour  Intake    780 ml  Output    800 ml  Net    -20 ml   Filed Weights   07/10/14 2100 07/11/14 0447 07/12/14 0520  Weight: 98.431 kg (217 lb) 98.4 kg (216 lb 14.9 oz) 98.4 kg (216 lb 14.9 oz)    Exam:  General: in no acute distress.  Heart: Regular rate and rhythm. Lungs: Good air movement, clear Abdomen: Soft, nontender, nondistended, positive bowel sounds.    Data Reviewed: Basic Metabolic Panel:  Recent Labs Lab 07/10/14 1234 07/12/14 0401  NA 141 143  K 4.2 4.0  CL 101 100  CO2 27 30  GLUCOSE 116* 110*  BUN 18 21  CREATININE 1.07 1.53*  CALCIUM 9.7 8.7   Liver  Function Tests:  Recent Labs Lab 07/10/14 1234  AST 13  ALT 8  ALKPHOS 46  BILITOT 0.4  PROT 6.6  ALBUMIN 3.5   No results found for this basename: LIPASE, AMYLASE,  in the last 168 hours No results found for this basename: AMMONIA,  in the last 168 hours CBC:  Recent Labs Lab 07/10/14 1234  WBC 7.6  NEUTROABS 5.5  HGB 11.9*  HCT 37.3  MCV 90.1  PLT 221   Cardiac Enzymes:  Recent Labs Lab 07/10/14 1234  TROPONINI <0.30   BNP (last 3 results)  Recent Labs  04/10/14 1855  PROBNP 239.0   CBG:  Recent Labs Lab 07/11/14 0939 07/11/14 1144 07/11/14 1703 07/11/14 2032 07/12/14 0744  GLUCAP 113* 162* 97 150* 103*    Recent Results (from the past 240 hour(s))  URINE CULTURE     Status: None   Collection Time    07/10/14 12:49 PM      Result Value Ref Range Status   Specimen Description URINE, CATHETERIZED   Final   Special Requests NONE   Final   Culture  Setup Time     Final   Value: 07/10/2014 19:49     Performed at [place] PENDING   Incomplete   Culture  Final   Value: Culture reincubated for better growth     Performed at [facility]   Report Status PENDING   Incomplete     Studies: Dg Chest 2 View  07/10/2014   CLINICAL DATA:  Low oxygen sats.  Fatigue and weakness.  EXAM: CHEST  2 VIEW  COMPARISON:  04/10/2014.  FINDINGS: Two view exam shows no evidence for focal airspace consolidation. No airspace pulmonary edema. Mild vascular congestion noted. Interstitial markings are diffusely coarsened with chronic features. The cardio pericardial silhouette is enlarged. There is bibasilar atelectasis or infection. Imaged bony structures of the thorax are intact. Telemetry leads overlie the chest.  IMPRESSION: Low volume film with patchy opacity at the bases, probably atelectatic although basilar pneumonia cannot be completely excluded.   Electronically Signed   By: Misty [name] M.D.   On: 07/10/2014 13:22   Ct Head  Wo Contrast  07/10/2014   CLINICAL DATA:  Mental status changes.  EXAM: CT HEAD WITHOUT CONTRAST  TECHNIQUE: Contiguous axial images were obtained from the base of the skull through the vertex without intravenous contrast.  COMPARISON:  04/10/2014.  FINDINGS: There is no evidence for acute hemorrhage, hydrocephalus, or abnormal extra-axial fluid collection. No definite CT evidence for acute infarction. Patchy low attenuation in the deep hemispheric and periventricular white matter is nonspecific, but likely reflects chronic microvascular ischemic demyelination. Small focus of dural calcification or meningioma identified in the left frontal region as noted on previous studies. This is stable in the interval without mass-effect or edema. The visualized paranasal sinuses and mastoid air cells are clear.  IMPRESSION: Stable exam. Chronic small vessel white matter ischemic disease with small dural calcification versus meningioma in the convexity. No new or acute interval findings.   Electronically Signed   By: Misty [name] M.D.   On: 07/10/2014 13:26   Mr Brain Wo Contrast  07/11/2014   CLINICAL DATA:  80 year old female with dementia, diabetes and high blood pressure presenting with staring spells.  EXAM: MRI HEAD WITHOUT CONTRAST  TECHNIQUE: Multiplanar, multiecho pulse sequences of the brain and surrounding structures were obtained without intravenous contrast.  COMPARISON:  07/10/2014 CT and 04/12/2014 MR.  FINDINGS: No acute infarct.  Mild small vessel disease type changes.  No intracranial hemorrhage.  Posterior left frontal extra-axial partially calcified 1.6 x 1.2 x 0.9 cm lesion has appearance suggestive of a small meningioma without surrounding vasogenic edema. Dural calcification as previously questioned is felt to be a secondary consideration.  No hydrocephalus.  Major intracranial vascular structures are patent.  Cerebellar tonsils are minimally low lying but within the range of normal limits.  Pituitary  gland narrowed in a right to left direction but of normal size.  IMPRESSION: No acute abnormality.  Please see above   Electronically Signed   By: Chauncey Cruel M.D.   On: 07/11/2014 09:39    Scheduled Meds: . donepezil  20 mg Oral QHS  . escitalopram  20 mg Oral Daily  . feeding supplement (GLUCERNA SHAKE)  237 mL Oral TID BM  . heparin  5,000 Units Subcutaneous 3 times per day  . insulin aspart  0-15 Units Subcutaneous TID WC  . insulin aspart  0-5 Units Subcutaneous QHS  . levETIRAcetam  500 mg Oral BID  . piperacillin-tazobactam (ZOSYN)  IV  3.375 g Intravenous Q8H  . risperiDONE  1 mg Oral BID  . sodium chloride  3 mL Intravenous Q12H   Continuous Infusions:    Charlynne Cousins  [facility] Pager [phone]. If 8PM-8AM, please contact night-coverage at [email or web address], password [facility] 07/12/2014, 8:13 AM  LOS: 2 days     **Disclaimer: This note may have been dictated with voice recognition software. Similar sounding words can inadvertently be transcribed and this note may contain transcription errors which may not have been corrected upon publication of note.**

## 2014-07-12 NOTE — Progress Notes (Signed)
Subjective: No further episodes.   Exam: Filed Vitals:   07/12/14 0520  BP: 145/57  Pulse: 59  Temp: 97.7 F (36.5 C)  Resp: 18   Gen: In bed, NAD MS: Awake, does not know where she is, what year it is, or who her daughter is(daughter states that this is baseline) LN:LGXQJ, EOMI Motor: MAE spontaneously.   EEG: right frontal sharp waves.   Impression: 80 yo F with transient unresponsiveness. With sharp waves on EEG and dementia, I would favor AED therapy and keppra has been started.   Recommendations: 1)keppra 500mg  BID.   Roland Rack, MD [facility] [phone]  If 7pm- 7am, please page neurology on call as listed in [place].

## 2014-07-12 NOTE — Discharge Summary (Addendum)
Physician Discharge Summary  Linda [name] XBD:[id number] DOB: 06-Dec-1929 DOA: 07/10/2014  PCP: Leonides Sake, MD  Admit date: 07/10/2014 Discharge date: 07/12/2014  Time spent: 35 minutes  Recommendations for Outpatient Follow-up:  1. Follow up with PCP in 4 weeks. 2. Follow up with neurology in 2-4 weeks.  Discharge Diagnoses:  Principal Problem:   Hypoxia Active Problems:   DIABETES MELLITUS, TYPE II   OBESITY   HYPERTENSION   Dementia   Discharge Condition: stable  Diet recommendation: ADA  Filed Weights   07/10/14 2100 07/11/14 0447 07/12/14 0520  Weight: 98.431 kg (217 lb) 98.4 kg (216 lb 14.9 oz) 98.4 kg (216 lb 14.9 oz)    History of present illness:  HPI: Linda [name] is a 80 y.o. female  With a hx of dementia, DM, HTN, and recently treated UTI who presents to the ED with witnessed staring spells associated with hypoxia that started 2 days prior to admission. Pt is currently under the care of her daughter. While at home, pt had 3 episodes described as "staring into space" lasting several minutes and eventually self-resolving. After each episode, the patient was noted to "fall asleep," eventually becoming more alert after 10-15 min. Also, during these episodes, pt was noted to have O2 sats as low as the 50's.   Hospital Course:  Staring spell/ ? Seizure like activity:  - MRI as below with no small meningioma 1.6x1.2x0.9 cm without vasogenic. No evidence of stroke - EEG showed  eliptiform waves, Diffuse gray matter disturbance that is etiologically nonspecific  - Neurology recommended Keppra.  Hypoxia  - Unclear etiology. Ambulated with out oxygen and desat. Most likely senile CPOD.  Dementia:  - Patient did have confusion last night likely sundowning.  - EEG compatible with dementia.  - no changes med to her medications.  HYPERTENSION  - stable, cont meds.   DIABETES MELLITUS, TYPE II  - good control.    Procedures:  EEG 9.1.2015: EEG showed  eliptiform waves, Diffuse gray matter disturbance that is etiologically nonspecific   MRI 8.1.2015: small meningioma 1.6x1.2x0.9 cm without vasogenic. No evidence of stroke  CT head 8.31.2015: Chronic small vessel white matter ischemic disease with small dural calcification versus meningioma in the convexity.   Consultations:  neurology  Discharge Exam: Filed Vitals:   07/12/14 0520  BP: 145/57  Pulse: 59  Temp: 97.7 F (36.5 C)  Resp: 18    General: A&O x3 Cardiovascular: RRR Respiratory: good air movement CTA B/L  Discharge Instructions You were cared for by a hospitalist during your hospital stay. If you have any questions about your discharge medications or the care you received while you were in the hospital after you are discharged, you can call the unit and asked to speak with the hospitalist on call if the hospitalist that took care of you is not available. Once you are discharged, your primary care physician will handle any further medical issues. Please note that NO REFILLS for any discharge medications will be authorized once you are discharged, as it is imperative that you return to your primary care physician (or establish a relationship with a primary care physician if you do not have one) for your aftercare needs so that they can reassess your need for medications and monitor your lab values.      Discharge Instructions   Diet - low sodium heart healthy    Complete by:  As directed      Increase activity slowly    Complete by:  As directed             Medication List         acetaminophen 325 MG tablet  Commonly known as:  TYLENOL  Take 2 tablets (650 mg total) by mouth every 6 (six) hours as needed for mild pain (or Fever >/= 101).     alendronate 70 MG tablet  Commonly known as:  FOSAMAX  Take 70 mg by mouth every 7 (seven) days. Saturday. Take with a full glass of water on an empty stomach.     donepezil 10 MG tablet  Commonly known as:  ARICEPT   Take 20 mg by mouth at bedtime.     escitalopram 20 MG tablet  Commonly known as:  LEXAPRO  Take 20 mg by mouth daily. For nerves     feeding supplement (GLUCERNA SHAKE) Liqd  Take 237 mLs by mouth 3 (three) times daily between meals.     levETIRAcetam 500 MG tablet  Commonly known as:  KEPPRA  Take 1 tablet (500 mg total) by mouth 2 (two) times daily.     MYRBETRIQ 25 MG Tb24 tablet  Generic drug:  mirabegron ER  Take 25 mg by mouth daily.     nystatin powder  Commonly known as:  MYCOSTATIN  Apply topically daily. Apply under breasts and under stomach fold.     ondansetron 4 MG tablet  Commonly known as:  ZOFRAN  Take 4 mg by mouth every 8 (eight) hours as needed for nausea or vomiting.     oxyCODONE-acetaminophen 10-325 MG per tablet  Commonly known as:  PERCOCET  Take 1 tablet by mouth every 4 (four) hours as needed for pain.     risperiDONE 0.5 MG tablet  Commonly known as:  RISPERDAL  Take 2 tablets (1 mg total) by mouth 2 (two) times daily.     senna 8.6 MG Tabs tablet  Commonly known as:  SENOKOT  Take 1 tablet by mouth daily as needed for mild constipation.     traZODone 100 MG tablet  Commonly known as:  DESYREL  Take 100 mg by mouth at bedtime.       Allergies  Allergen Reactions  . Amoxicillin Other (See Comments)    REACTION: aching all over  . Atorvastatin Other (See Comments)    REACTION: increased CPK  . Cephalexin Nausea Only and Other (See Comments)    Weak, Headache  . Cetirizine Hcl Other (See Comments)    REACTION: makes her head feel big  . Cortisone Other (See Comments)    REACTION: increased bp  . Ezetimibe-Simvastatin Other (See Comments)    unknown  . Fentanyl Other (See Comments)    REACTION: stinging in her skin  . Guaifenesin Other (See Comments)    unknown  . Ketorolac Tromethamine     REACTION: no work  . Lisinopril Cough    ACE related  . Metformin Other (See Comments)    epistaxis  . Nsaids Other (See Comments)     epistaxis  . Penicillins Nausea Only and Other (See Comments)    headache  . Rofecoxib Itching  . Rosuvastatin Other (See Comments)    Myalgias  . Simvastatin Other (See Comments)    unknown  . Spironolactone Nausea Only  . Sulfamethoxazole-Tmp Ds Other (See Comments)    unknown  . Codeine Rash  . Tape Rash      The results of significant diagnostics from this hospitalization (including imaging, microbiology, ancillary and laboratory) are listed  below for reference.    Significant Diagnostic Studies: Dg Chest 2 View  07/10/2014   CLINICAL DATA:  Low oxygen sats.  Fatigue and weakness.  EXAM: CHEST  2 VIEW  COMPARISON:  04/10/2014.  FINDINGS: Two view exam shows no evidence for focal airspace consolidation. No airspace pulmonary edema. Mild vascular congestion noted. Interstitial markings are diffusely coarsened with chronic features. The cardio pericardial silhouette is enlarged. There is bibasilar atelectasis or infection. Imaged bony structures of the thorax are intact. Telemetry leads overlie the chest.  IMPRESSION: Low volume film with patchy opacity at the bases, probably atelectatic although basilar pneumonia cannot be completely excluded.   Electronically Signed   By: Misty [name] M.D.   On: 07/10/2014 13:22   Ct Head Wo Contrast  07/10/2014   CLINICAL DATA:  Mental status changes.  EXAM: CT HEAD WITHOUT CONTRAST  TECHNIQUE: Contiguous axial images were obtained from the base of the skull through the vertex without intravenous contrast.  COMPARISON:  04/10/2014.  FINDINGS: There is no evidence for acute hemorrhage, hydrocephalus, or abnormal extra-axial fluid collection. No definite CT evidence for acute infarction. Patchy low attenuation in the deep hemispheric and periventricular white matter is nonspecific, but likely reflects chronic microvascular ischemic demyelination. Small focus of dural calcification or meningioma identified in the left frontal region as noted on previous  studies. This is stable in the interval without mass-effect or edema. The visualized paranasal sinuses and mastoid air cells are clear.  IMPRESSION: Stable exam. Chronic small vessel white matter ischemic disease with small dural calcification versus meningioma in the convexity. No new or acute interval findings.   Electronically Signed   By: Misty [name] M.D.   On: 07/10/2014 13:26   Mr Brain Wo Contrast  07/11/2014   CLINICAL DATA:  80 year old female with dementia, diabetes and high blood pressure presenting with staring spells.  EXAM: MRI HEAD WITHOUT CONTRAST  TECHNIQUE: Multiplanar, multiecho pulse sequences of the brain and surrounding structures were obtained without intravenous contrast.  COMPARISON:  07/10/2014 CT and 04/12/2014 MR.  FINDINGS: No acute infarct.  Mild small vessel disease type changes.  No intracranial hemorrhage.  Posterior left frontal extra-axial partially calcified 1.6 x 1.2 x 0.9 cm lesion has appearance suggestive of a small meningioma without surrounding vasogenic edema. Dural calcification as previously questioned is felt to be a secondary consideration.  No hydrocephalus.  Major intracranial vascular structures are patent.  Cerebellar tonsils are minimally low lying but within the range of normal limits.  Pituitary gland narrowed in a right to left direction but of normal size.  IMPRESSION: No acute abnormality.  Please see above   Electronically Signed   By: Chauncey Cruel M.D.   On: 07/11/2014 09:39    Microbiology: Recent Results (from the past 240 hour(s))  URINE CULTURE     Status: None   Collection Time    07/10/14 12:49 PM      Result Value Ref Range Status   Specimen Description URINE, CATHETERIZED   Final   Special Requests NONE   Final   Culture  Setup Time     Final   Value: 07/10/2014 19:49     Performed at [place]     Final   Value: >=100,000 COLONIES/ML     Performed at [facility]   Culture     Final   Value:  [place]     Performed at [facility]   Report Status  PENDING   Incomplete     Labs: Basic Metabolic Panel:  Recent Labs Lab 07/10/14 1234 07/12/14 0401  NA 141 143  K 4.2 4.0  CL 101 100  CO2 27 30  GLUCOSE 116* 110*  BUN 18 21  CREATININE 1.07 1.53*  CALCIUM 9.7 8.7   Liver Function Tests:  Recent Labs Lab 07/10/14 1234  AST 13  ALT 8  ALKPHOS 46  BILITOT 0.4  PROT 6.6  ALBUMIN 3.5   No results found for this basename: LIPASE, AMYLASE,  in the last 168 hours No results found for this basename: AMMONIA,  in the last 168 hours CBC:  Recent Labs Lab 07/10/14 1234  WBC 7.6  NEUTROABS 5.5  HGB 11.9*  HCT 37.3  MCV 90.1  PLT 221   Cardiac Enzymes:  Recent Labs Lab 07/10/14 1234  TROPONINI <0.30   BNP: BNP (last 3 results)  Recent Labs  04/10/14 1855  PROBNP 239.0   CBG:  Recent Labs Lab 07/11/14 0939 07/11/14 1144 07/11/14 1703 07/11/14 2032 07/12/14 0744  GLUCAP 113* 162* 97 150* 103*       Signed:  FELIZ ORTIZ, ABRAHAM  [facility] 07/12/2014, 8:55 AM

## 2014-07-12 NOTE — Discharge Instructions (Signed)
Diabetes Mellitus and Food It is important for you to manage your blood sugar (glucose) level. Your blood glucose level can be greatly affected by what you eat. Eating healthier foods in the appropriate amounts throughout the day at about the same time each day will help you control your blood glucose level. It can also help slow or prevent worsening of your diabetes mellitus. Healthy eating may even help you improve the level of your blood pressure and reach or maintain a healthy weight.  HOW CAN FOOD AFFECT ME? Carbohydrates Carbohydrates affect your blood glucose level more than any other type of food. Your dietitian will help you determine how many carbohydrates to eat at each meal and teach you how to count carbohydrates. Counting carbohydrates is important to keep your blood glucose at a healthy level, especially if you are using insulin or taking certain medicines for diabetes mellitus. Alcohol Alcohol can cause sudden decreases in blood glucose (hypoglycemia), especially if you use insulin or take certain medicines for diabetes mellitus. Hypoglycemia can be a life-threatening condition. Symptoms of hypoglycemia (sleepiness, dizziness, and disorientation) are similar to symptoms of having too much alcohol.  If your health care provider has given you approval to drink alcohol, do so in moderation and use the following guidelines:  Women should not have more than one drink per day, and men should not have more than two drinks per day. One drink is equal to:  12 oz of beer.  5 oz of wine.  1 oz of hard liquor.  Do not drink on an empty stomach.  Keep yourself hydrated. Have water, diet soda, or unsweetened iced tea.  Regular soda, juice, and other mixers might contain a lot of carbohydrates and should be counted. WHAT FOODS ARE NOT RECOMMENDED? As you make food choices, it is important to remember that all foods are not the same. Some foods have fewer nutrients per serving than other  foods, even though they might have the same number of calories or carbohydrates. It is difficult to get your body what it needs when you eat foods with fewer nutrients. Examples of foods that you should avoid that are high in calories and carbohydrates but low in nutrients include:  Trans fats (most processed foods list trans fats on the Nutrition Facts label).  Regular soda.  Juice.  Candy.  Sweets, such as cake, pie, doughnuts, and cookies.  Fried foods. WHAT FOODS CAN I EAT? Have nutrient-rich foods, which will nourish your body and keep you healthy. The food you should eat also will depend on several factors, including:  The calories you need.  The medicines you take.  Your weight.  Your blood glucose level.  Your blood pressure level.  Your cholesterol level. You also should eat a variety of foods, including:  Protein, such as meat, poultry, fish, tofu, nuts, and seeds (lean animal proteins are best).  Fruits.  Vegetables.  Dairy products, such as milk, cheese, and yogurt (low fat is best).  Breads, grains, pasta, cereal, rice, and beans.  Fats such as olive oil, trans fat-free margarine, canola oil, avocado, and olives. DOES EVERYONE WITH DIABETES MELLITUS HAVE THE SAME MEAL PLAN? Because every person with diabetes mellitus is different, there is not one meal plan that works for everyone. It is very important that you meet with a dietitian who will help you create a meal plan that is just right for you. Document Released: 07/24/2005 Document Revised: 11/01/2013 Document Reviewed: 09/23/2013 [employer] Patient Information 2015 [employer]. This   information is not intended to replace advice given to you by your health care provider. Make sure you discuss any questions you have with your health care provider.  

## 2014-07-12 NOTE — ED Provider Notes (Signed)
CSN: [id number]     Arrival date & time 07/10/14  1116 History   First MD Initiated Contact with Patient 07/10/14 1119     Chief Complaint  Patient presents with  . Fatigue  . low sats      Level V caveat: dementia  HPI Patient brought to the emergency department for increased lethargy and intermittent episodes of staring spells which seem to have a tender 15 minute episode of less responsiveness afterwards.  There is no noted seizure activity of her arms or legs.  No bowel or bladder incontinence.  No history of seizures.  No fevers or chills.  Family denies nausea and vomiting.  Patient denies any pain or discomfort at this time.  Patient has dementia and therefore is unable to provide any reasonable history.  Family reports that they usually note which is urinary tract infection based on the color and smell of the urine.  It noted no urinary abnormalities for the patient.  Her appetite has otherwise been fine per family.   Past Medical History  Diagnosis Date  . Allergy     allergic rhinitis  . Diabetes mellitus     type II  . Hypertension   . Osteopenia   . TIA (transient ischemic attack)   . Contact dermatitis     on buttocks  . Nosebleed   . Anxiety   . Renal insufficiency   . Pyelonephritis   . Dementia   . Chiari malformation    Past Surgical History  Procedure Laterality Date  . Rotator cuff repair  06/2007    right  . Abdominal hysterectomy    . Back surgery  03/1999    disk  . Breast surgery      breast biopsy x2 negative  . Shoulder surgery  2007    left  . Joint replacement      Rt knee patella replacement  . Colonoscopy  08/28/2012    Procedure: COLONOSCOPY;  Surgeon: Beryle Beams, MD;  Location: [facility] ENDOSCOPY;  Service: Endoscopy;  Laterality: N/A;   Family History  Problem Relation Age of Onset  . Stroke Mother   . Stroke Father    History  Substance Use Topics  . Smoking status: Never Smoker   . Smokeless tobacco: Never Used  . Alcohol Use:  No   OB History   Grav Para Term Preterm Abortions TAB SAB Ect Mult Living                 Review of Systems  Unable to perform ROS     Allergies  Amoxicillin; Atorvastatin; Cephalexin; Cetirizine hcl; Cortisone; Ezetimibe-simvastatin; Fentanyl; Guaifenesin; Ketorolac tromethamine; Lisinopril; Metformin; Nsaids; Penicillins; Rofecoxib; Rosuvastatin; Simvastatin; Spironolactone; Sulfamethoxazole-tmp ds; Codeine; and Tape  Home Medications   Prior to Admission medications   Medication Sig Start Date End Date Taking? Authorizing Provider  acetaminophen (TYLENOL) 325 MG tablet Take 2 tablets (650 mg total) by mouth every 6 (six) hours as needed for mild pain (or Fever >/= 101). 04/14/14  Yes Bonnielee Haff, MD  alendronate (FOSAMAX) 70 MG tablet Take 70 mg by mouth every 7 (seven) days. Saturday. Take with a full glass of water on an empty stomach.   Yes Historical Provider, MD  donepezil (ARICEPT) 10 MG tablet Take 20 mg by mouth at bedtime.    Yes Historical Provider, MD  escitalopram (LEXAPRO) 20 MG tablet Take 20 mg by mouth daily. For nerves   Yes Historical Provider, MD  feeding supplement, [place], ([place]  SHAKE) LIQD Take 237 mLs by mouth 3 (three) times daily between meals. 04/14/14  Yes Bonnielee Haff, MD  mirabegron ER (MYRBETRIQ) 25 MG TB24 tablet Take 25 mg by mouth daily.   Yes Historical Provider, MD  nystatin (MYCOSTATIN) powder Apply topically daily. Apply under breasts and under stomach fold.   Yes Historical Provider, MD  ondansetron (ZOFRAN) 4 MG tablet Take 4 mg by mouth every 8 (eight) hours as needed for nausea or vomiting.   Yes Historical Provider, MD  oxyCODONE-acetaminophen (PERCOCET) 10-325 MG per tablet Take 1 tablet by mouth every 4 (four) hours as needed for pain.   Yes Historical Provider, MD  risperiDONE (RISPERDAL) 0.5 MG tablet Take 2 tablets (1 mg total) by mouth 2 (two) times daily. 04/14/14  Yes Bonnielee Haff, MD  senna (SENOKOT) 8.6 MG TABS tablet  Take 1 tablet by mouth daily as needed for mild constipation.   Yes Historical Provider, MD  traZODone (DESYREL) 100 MG tablet Take 100 mg by mouth at bedtime.   Yes Historical Provider, MD   BP 145/57  Pulse 59  Temp(Src) 97.7 F (36.5 C) (Oral)  Resp 18  Ht 5' 5.5" (1.664 m)  Wt 216 lb 14.9 oz (98.4 kg)  BMI 35.54 kg/m2  SpO2 95% Physical Exam  Nursing note and vitals reviewed. Constitutional: She appears well-developed and well-nourished. No distress.  HENT:  Head: Normocephalic and atraumatic.  Eyes: EOM are normal. Pupils are equal, round, and reactive to light.  Neck: Normal range of motion.  Cardiovascular: Normal rate, regular rhythm and normal heart sounds.   Pulmonary/Chest: Effort normal and breath sounds normal.  Abdominal: Soft. She exhibits no distension. There is no tenderness.  Musculoskeletal: Normal range of motion.  Neurological: She is alert.  5/5 strength in major muscle groups of  bilateral upper and lower extremities. Speech normal. No facial asymetry.  Oriented x2  Skin: Skin is warm and dry.  Psychiatric: She has a normal mood and affect. Judgment normal.    ED Course  Procedures (including critical care time) Labs Review Labs Reviewed  CBC WITH DIFFERENTIAL - Abnormal; Notable for the following:    Hemoglobin 11.9 (*)    All other components within normal limits  COMPREHENSIVE METABOLIC PANEL - Abnormal; Notable for the following:    Glucose, Bld 116 (*)    GFR calc non Af Amer 47 (*)    GFR calc Af Amer 54 (*)    All other components within normal limits  URINALYSIS, ROUTINE W REFLEX MICROSCOPIC - Abnormal; Notable for the following:    APPearance CLOUDY (*)    Hgb urine dipstick LARGE (*)    Leukocytes, UA MODERATE (*)    All other components within normal limits  URINE MICROSCOPIC-ADD ON - Abnormal; Notable for the following:    Bacteria, UA FEW (*)    All other components within normal limits  GLUCOSE, CAPILLARY - Abnormal; Notable for  the following:    Glucose-Capillary 131 (*)    All other components within normal limits  GLUCOSE, CAPILLARY - Abnormal; Notable for the following:    Glucose-Capillary 113 (*)    All other components within normal limits  GLUCOSE, CAPILLARY - Abnormal; Notable for the following:    Glucose-Capillary 162 (*)    All other components within normal limits  BASIC METABOLIC PANEL - Abnormal; Notable for the following:    Glucose, Bld 110 (*)    Creatinine, Ser 1.53 (*)    GFR calc non Af Amer 31 (*)  GFR calc Af Amer 35 (*)    All other components within normal limits  GLUCOSE, CAPILLARY - Abnormal; Notable for the following:    Glucose-Capillary 150 (*)    All other components within normal limits  CBG MONITORING, ED - Abnormal; Notable for the following:    Glucose-Capillary 121 (*)    All other components within normal limits  URINE CULTURE  TROPONIN I  LACTIC ACID, PLASMA  GLUCOSE, CAPILLARY    Imaging Review Dg Chest 2 View  07/10/2014   CLINICAL DATA:  Low oxygen sats.  Fatigue and weakness.  EXAM: CHEST  2 VIEW  COMPARISON:  04/10/2014.  FINDINGS: Two view exam shows no evidence for focal airspace consolidation. No airspace pulmonary edema. Mild vascular congestion noted. Interstitial markings are diffusely coarsened with chronic features. The cardio pericardial silhouette is enlarged. There is bibasilar atelectasis or infection. Imaged bony structures of the thorax are intact. Telemetry leads overlie the chest.  IMPRESSION: Low volume film with patchy opacity at the bases, probably atelectatic although basilar pneumonia cannot be completely excluded.   Electronically Signed   By: Misty [name] M.D.   On: 07/10/2014 13:22   Ct Head Wo Contrast  07/10/2014   CLINICAL DATA:  Mental status changes.  EXAM: CT HEAD WITHOUT CONTRAST  TECHNIQUE: Contiguous axial images were obtained from the base of the skull through the vertex without intravenous contrast.  COMPARISON:  04/10/2014.   FINDINGS: There is no evidence for acute hemorrhage, hydrocephalus, or abnormal extra-axial fluid collection. No definite CT evidence for acute infarction. Patchy low attenuation in the deep hemispheric and periventricular white matter is nonspecific, but likely reflects chronic microvascular ischemic demyelination. Small focus of dural calcification or meningioma identified in the left frontal region as noted on previous studies. This is stable in the interval without mass-effect or edema. The visualized paranasal sinuses and mastoid air cells are clear.  IMPRESSION: Stable exam. Chronic small vessel white matter ischemic disease with small dural calcification versus meningioma in the convexity. No new or acute interval findings.   Electronically Signed   By: Misty [name] M.D.   On: 07/10/2014 13:26      EKG Interpretation None      MDM   Final diagnoses:  HCAP (healthcare-associated pneumonia)  Acute cystitis with hematuria  Seizure-like activity    Concern for possibility of seizures.  Head CT without abnormalities.  Urine will be treated with antibiotics.  Questionable pneumonia which be covered with antibiotics.  I spoke with neurology who will evaluate the patient at [facility].  Patient will likely need MRI and EEG.  We'll hold on antiepileptics at this time.  Hospitalist call for admission    Hoy Morn, MD 07/12/14 [phone]

## 2014-07-13 LAB — URINE CULTURE: Colony Count: >=100000

## 2014-08-10 ENCOUNTER — Institutional Professional Consult (permissible substitution): Payer: Self-pay | Admitting: Internal Medicine

## 2014-08-21 ENCOUNTER — Inpatient Hospital Stay (HOSPITAL_COMMUNITY)
Admission: EM | Admit: 2014-08-21 | Discharge: 2014-08-24 | DRG: 689 | Disposition: A | Discharge status: Skilled Nursing Facility | Payer: Medicare Other | Attending: Internal Medicine | Admitting: Internal Medicine

## 2014-08-21 ENCOUNTER — Emergency Department (HOSPITAL_COMMUNITY): Payer: Medicare Other

## 2014-08-21 ENCOUNTER — Encounter (HOSPITAL_COMMUNITY): Payer: Self-pay | Admitting: Emergency Medicine

## 2014-08-21 DIAGNOSIS — G40909 Epilepsy, unspecified, not intractable, without status epilepticus: Secondary | ICD-10-CM | POA: Diagnosis present

## 2014-08-21 DIAGNOSIS — I119 Hypertensive heart disease without heart failure: Secondary | ICD-10-CM

## 2014-08-21 DIAGNOSIS — I129 Hypertensive chronic kidney disease with stage 1 through stage 4 chronic kidney disease, or unspecified chronic kidney disease: Secondary | ICD-10-CM | POA: Diagnosis present

## 2014-08-21 DIAGNOSIS — E7800 Pure hypercholesterolemia, unspecified: Secondary | ICD-10-CM

## 2014-08-21 DIAGNOSIS — N189 Chronic kidney disease, unspecified: Secondary | ICD-10-CM | POA: Diagnosis present

## 2014-08-21 DIAGNOSIS — Z79899 Other long term (current) drug therapy: Secondary | ICD-10-CM | POA: Diagnosis not present

## 2014-08-21 DIAGNOSIS — B965 Pseudomonas (aeruginosa) (mallei) (pseudomallei) as the cause of diseases classified elsewhere: Secondary | ICD-10-CM | POA: Diagnosis present

## 2014-08-21 DIAGNOSIS — F430 Acute stress reaction: Secondary | ICD-10-CM

## 2014-08-21 DIAGNOSIS — E669 Obesity, unspecified: Secondary | ICD-10-CM | POA: Diagnosis present

## 2014-08-21 DIAGNOSIS — E1129 Type 2 diabetes mellitus with other diabetic kidney complication: Secondary | ICD-10-CM

## 2014-08-21 DIAGNOSIS — M5030 Other cervical disc degeneration, unspecified cervical region: Secondary | ICD-10-CM

## 2014-08-21 DIAGNOSIS — N390 Urinary tract infection, site not specified: Principal | ICD-10-CM

## 2014-08-21 DIAGNOSIS — F0391 Unspecified dementia with behavioral disturbance: Secondary | ICD-10-CM | POA: Diagnosis present

## 2014-08-21 DIAGNOSIS — I951 Orthostatic hypotension: Secondary | ICD-10-CM

## 2014-08-21 DIAGNOSIS — G935 Compression of brain: Secondary | ICD-10-CM | POA: Diagnosis present

## 2014-08-21 DIAGNOSIS — N058 Unspecified nephritic syndrome with other morphologic changes: Secondary | ICD-10-CM

## 2014-08-21 DIAGNOSIS — E1122 Type 2 diabetes mellitus with diabetic chronic kidney disease: Secondary | ICD-10-CM | POA: Diagnosis present

## 2014-08-21 DIAGNOSIS — N179 Acute kidney failure, unspecified: Secondary | ICD-10-CM

## 2014-08-21 DIAGNOSIS — Z8673 Personal history of transient ischemic attack (TIA), and cerebral infarction without residual deficits: Secondary | ICD-10-CM | POA: Diagnosis not present

## 2014-08-21 DIAGNOSIS — M75120 Complete rotator cuff tear or rupture of unspecified shoulder, not specified as traumatic: Secondary | ICD-10-CM

## 2014-08-21 DIAGNOSIS — G9340 Encephalopathy, unspecified: Secondary | ICD-10-CM

## 2014-08-21 DIAGNOSIS — D631 Anemia in chronic kidney disease: Secondary | ICD-10-CM | POA: Diagnosis present

## 2014-08-21 DIAGNOSIS — Z880 Allergy status to penicillin: Secondary | ICD-10-CM

## 2014-08-21 DIAGNOSIS — Z6841 Body Mass Index (BMI) 40.0 and over, adult: Secondary | ICD-10-CM

## 2014-08-21 DIAGNOSIS — Z66 Do not resuscitate: Secondary | ICD-10-CM | POA: Diagnosis present

## 2014-08-21 DIAGNOSIS — M8580 Other specified disorders of bone density and structure, unspecified site: Secondary | ICD-10-CM | POA: Diagnosis present

## 2014-08-21 DIAGNOSIS — F419 Anxiety disorder, unspecified: Secondary | ICD-10-CM | POA: Diagnosis present

## 2014-08-21 DIAGNOSIS — R531 Weakness: Secondary | ICD-10-CM

## 2014-08-21 DIAGNOSIS — F0390 Unspecified dementia without behavioral disturbance: Secondary | ICD-10-CM

## 2014-08-21 DIAGNOSIS — Z91048 Other nonmedicinal substance allergy status: Secondary | ICD-10-CM

## 2014-08-21 DIAGNOSIS — R609 Edema, unspecified: Secondary | ICD-10-CM

## 2014-08-21 DIAGNOSIS — W57XXXA Bitten or stung by nonvenomous insect and other nonvenomous arthropods, initial encounter: Secondary | ICD-10-CM

## 2014-08-21 DIAGNOSIS — G9341 Metabolic encephalopathy: Secondary | ICD-10-CM | POA: Diagnosis present

## 2014-08-21 DIAGNOSIS — Z90710 Acquired absence of both cervix and uterus: Secondary | ICD-10-CM

## 2014-08-21 DIAGNOSIS — Z888 Allergy status to other drugs, medicaments and biological substances status: Secondary | ICD-10-CM

## 2014-08-21 DIAGNOSIS — Z885 Allergy status to narcotic agent status: Secondary | ICD-10-CM | POA: Diagnosis not present

## 2014-08-21 DIAGNOSIS — R630 Anorexia: Secondary | ICD-10-CM | POA: Diagnosis present

## 2014-08-21 DIAGNOSIS — Z881 Allergy status to other antibiotic agents status: Secondary | ICD-10-CM | POA: Diagnosis not present

## 2014-08-21 DIAGNOSIS — Y92009 Unspecified place in unspecified non-institutional (private) residence as the place of occurrence of the external cause: Secondary | ICD-10-CM

## 2014-08-21 DIAGNOSIS — Z9981 Dependence on supplemental oxygen: Secondary | ICD-10-CM

## 2014-08-21 DIAGNOSIS — E042 Nontoxic multinodular goiter: Secondary | ICD-10-CM

## 2014-08-21 DIAGNOSIS — R0902 Hypoxemia: Secondary | ICD-10-CM

## 2014-08-21 DIAGNOSIS — J309 Allergic rhinitis, unspecified: Secondary | ICD-10-CM | POA: Diagnosis present

## 2014-08-21 DIAGNOSIS — M949 Disorder of cartilage, unspecified: Secondary | ICD-10-CM

## 2014-08-21 DIAGNOSIS — W19XXXS Unspecified fall, sequela: Secondary | ICD-10-CM

## 2014-08-21 DIAGNOSIS — Z96651 Presence of right artificial knee joint: Secondary | ICD-10-CM | POA: Diagnosis present

## 2014-08-21 DIAGNOSIS — M899 Disorder of bone, unspecified: Secondary | ICD-10-CM

## 2014-08-21 HISTORY — DX: Epilepsy, unspecified, not intractable, without status epilepticus: G40.909

## 2014-08-21 LAB — COMPREHENSIVE METABOLIC PANEL
ALT: 9 U/L (ref 0–35)
AST: 13 U/L (ref 0–37)
Albumin: 3.4 g/dL — ABNORMAL LOW (ref 3.5–5.2)
Alkaline Phosphatase: 45 U/L (ref 39–117)
Anion gap: 10 (ref 5–15)
BUN: 29 mg/dL — AB (ref 6–23)
CALCIUM: 9.6 mg/dL (ref 8.4–10.5)
CO2: 28 mEq/L (ref 19–32)
CREATININE: 1.61 mg/dL — AB (ref 0.50–1.10)
Chloride: 104 mEq/L (ref 96–112)
GFR calc Af Amer: 33 mL/min — ABNORMAL LOW (ref >90)
GFR, EST NON AFRICAN AMERICAN: 29 mL/min — AB (ref >90)
GLUCOSE: 117 mg/dL — AB (ref 70–99)
Potassium: 5 mEq/L (ref 3.7–5.3)
SODIUM: 142 meq/L (ref 137–147)
Total Bilirubin: 0.3 mg/dL (ref 0.3–1.2)
Total Protein: 6.4 g/dL (ref 6.0–8.3)

## 2014-08-21 LAB — CBC WITH DIFFERENTIAL/PLATELET
BASOS PCT: 0 % (ref 0–1)
Basophils Absolute: 0 10*3/uL (ref 0.0–0.1)
EOS ABS: 0.2 10*3/uL (ref 0.0–0.7)
Eosinophils Relative: 2 % (ref 0–5)
HCT: 33.8 % — ABNORMAL LOW (ref 36.0–46.0)
Hemoglobin: 11.1 g/dL — ABNORMAL LOW (ref 12.0–15.0)
LYMPHS ABS: 1.6 10*3/uL (ref 0.7–4.0)
Lymphocytes Relative: 14 % (ref 12–46)
MCH: 30.1 pg (ref 26.0–34.0)
MCHC: 32.8 g/dL (ref 30.0–36.0)
MCV: 91.6 fL (ref 78.0–100.0)
Monocytes Absolute: 0.6 10*3/uL (ref 0.1–1.0)
Monocytes Relative: 6 % (ref 3–12)
NEUTROS PCT: 78 % — AB (ref 43–77)
Neutro Abs: 8.5 10*3/uL — ABNORMAL HIGH (ref 1.7–7.7)
PLATELETS: 227 10*3/uL (ref 150–400)
RBC: 3.69 MIL/uL — AB (ref 3.87–5.11)
RDW: 14.2 % (ref 11.5–15.5)
WBC: 10.9 10*3/uL — ABNORMAL HIGH (ref 4.0–10.5)

## 2014-08-21 LAB — URINALYSIS, ROUTINE W REFLEX MICROSCOPIC
GLUCOSE, UA: NEGATIVE mg/dL
Ketones, ur: NEGATIVE mg/dL
Nitrite: NEGATIVE
PROTEIN: 30 mg/dL — AB
Specific Gravity, Urine: 1.021 (ref 1.005–1.030)
UROBILINOGEN UA: 0.2 mg/dL (ref 0.0–1.0)
pH: 5 (ref 5.0–8.0)

## 2014-08-21 LAB — URINE MICROSCOPIC-ADD ON

## 2014-08-21 LAB — PRO B NATRIURETIC PEPTIDE: Pro B Natriuretic peptide (BNP): 90.9 pg/mL (ref 0–450)

## 2014-08-21 LAB — CBG MONITORING, ED: Glucose-Capillary: 120 mg/dL — ABNORMAL HIGH (ref 70–99)

## 2014-08-21 LAB — TROPONIN I

## 2014-08-21 LAB — I-STAT CG4 LACTIC ACID, ED: LACTIC ACID, VENOUS: 0.71 mmol/L (ref 0.5–2.2)

## 2014-08-21 MED ORDER — ENOXAPARIN SODIUM 40 MG/0.4ML ~~LOC~~ SOLN
40.0000 mg | SUBCUTANEOUS | Status: DC
  Administered 2014-08-21 – 2014-08-23 (×3): 40 mg via SUBCUTANEOUS
  Filled 2014-08-21 (×4): qty 0.4

## 2014-08-21 MED ORDER — MIRABEGRON ER 25 MG PO TB24
25.0000 mg | ORAL_TABLET | Freq: Every day | ORAL | Status: DC
  Administered 2014-08-22 – 2014-08-24 (×3): 25 mg via ORAL
  Filled 2014-08-21 (×3): qty 1

## 2014-08-21 MED ORDER — NYSTATIN 100000 UNIT/GM EX POWD
1.0000 g | Freq: Every day | CUTANEOUS | Status: DC
  Administered 2014-08-22 – 2014-08-23 (×2): 1 g via TOPICAL
  Filled 2014-08-21: qty 15

## 2014-08-21 MED ORDER — OXYCODONE-ACETAMINOPHEN 5-325 MG PO TABS
1.0000 | ORAL_TABLET | ORAL | Status: DC | PRN
  Administered 2014-08-23 – 2014-08-24 (×2): 1 via ORAL
  Filled 2014-08-21 (×2): qty 1

## 2014-08-21 MED ORDER — ACETAMINOPHEN 325 MG PO TABS: 650.0000 mg | ORAL_TABLET | Freq: Four times a day (QID) | ORAL | Status: DC | PRN

## 2014-08-21 MED ORDER — DEXTROSE 5 % IV SOLN
1.0000 g | INTRAVENOUS | Status: DC
  Administered 2014-08-23: 1 g via INTRAVENOUS
  Filled 2014-08-21 (×2): qty 10

## 2014-08-21 MED ORDER — OXYCODONE-ACETAMINOPHEN 10-325 MG PO TABS: 1.0000 | ORAL_TABLET | ORAL | Status: DC | PRN

## 2014-08-21 MED ORDER — ONDANSETRON HCL 4 MG PO TABS: 4.0000 mg | ORAL_TABLET | Freq: Three times a day (TID) | ORAL | Status: DC | PRN

## 2014-08-21 MED ORDER — ESCITALOPRAM OXALATE 20 MG PO TABS
20.0000 mg | ORAL_TABLET | Freq: Every day | ORAL | Status: DC
  Administered 2014-08-22 – 2014-08-24 (×3): 20 mg via ORAL
  Filled 2014-08-21 (×3): qty 1

## 2014-08-21 MED ORDER — ALUM & MAG HYDROXIDE-SIMETH 200-200-20 MG/5ML PO SUSP: 30.0000 mL | Freq: Four times a day (QID) | ORAL | Status: DC | PRN

## 2014-08-21 MED ORDER — LEVETIRACETAM 500 MG PO TABS
500.0000 mg | ORAL_TABLET | Freq: Two times a day (BID) | ORAL | Status: DC
  Administered 2014-08-21 – 2014-08-24 (×6): 500 mg via ORAL
  Filled 2014-08-21 (×7): qty 1

## 2014-08-21 MED ORDER — DEXTROSE 5 % IV SOLN
1.0000 g | Freq: Once | INTRAVENOUS | Status: AC
  Administered 2014-08-21: 1 g via INTRAVENOUS
  Filled 2014-08-21: qty 10

## 2014-08-21 MED ORDER — OXYCODONE HCL 5 MG PO TABS
5.0000 mg | ORAL_TABLET | ORAL | Status: DC | PRN
  Administered 2014-08-23 – 2014-08-24 (×2): 5 mg via ORAL
  Filled 2014-08-21 (×2): qty 1

## 2014-08-21 MED ORDER — GLUCERNA SHAKE PO LIQD
237.0000 mL | Freq: Three times a day (TID) | ORAL | Status: DC
  Administered 2014-08-21 – 2014-08-24 (×8): 237 mL via ORAL
  Filled 2014-08-21 (×9): qty 237

## 2014-08-21 MED ORDER — SODIUM CHLORIDE 0.9 % IV SOLN
INTRAVENOUS | Status: DC
  Administered 2014-08-21: 20:00:00 via INTRAVENOUS
  Administered 2014-08-22: 50 mL/h via INTRAVENOUS
  Administered 2014-08-23 – 2014-08-24 (×2): via INTRAVENOUS

## 2014-08-21 MED ORDER — SODIUM CHLORIDE 0.9 % IV BOLUS (SEPSIS)
500.0000 mL | Freq: Once | INTRAVENOUS | Status: AC
  Administered 2014-08-21: 500 mL via INTRAVENOUS

## 2014-08-21 MED ORDER — DONEPEZIL HCL 10 MG PO TABS
20.0000 mg | ORAL_TABLET | Freq: Every day | ORAL | Status: DC
  Administered 2014-08-21 – 2014-08-23 (×3): 20 mg via ORAL
  Filled 2014-08-21 (×4): qty 2

## 2014-08-21 MED ORDER — RISPERIDONE 1 MG PO TABS
1.0000 mg | ORAL_TABLET | Freq: Two times a day (BID) | ORAL | Status: DC
  Administered 2014-08-21 – 2014-08-24 (×6): 1 mg via ORAL
  Filled 2014-08-21 (×7): qty 1

## 2014-08-21 MED ORDER — TRAZODONE HCL 100 MG PO TABS
100.0000 mg | ORAL_TABLET | Freq: Every day | ORAL | Status: DC
  Administered 2014-08-21 – 2014-08-23 (×3): 100 mg via ORAL
  Filled 2014-08-21 (×4): qty 1

## 2014-08-21 MED ORDER — SENNA 8.6 MG PO TABS: 1.0000 | ORAL_TABLET | Freq: Every day | ORAL | Status: DC | PRN

## 2014-08-21 NOTE — ED Notes (Signed)
MD at bedside. ADMITTING MD RAMA AT BS SPEAKING WITH PT AND FAMILY

## 2014-08-21 NOTE — H&P (Addendum)
History and Physical:    Linda [name] MWN:[id number] DOB: Apr 01, 1930 DOA: 08/21/2014  Referring physician: Dr. Stevie Kern. PCP: Leonides Sake, MD   Chief Complaint: Weakness  History of Present Illness:   Linda [name] is an 80 y.o. female with a PMH of dementia, seizure disorder on Keppra, DM type II (diet controlled), frequent UTI who was treated with PO Cipro followed by a course of Cephalexin after urine cultures revealed a pathogen resistant to Cipro.  The patient lives with her daughter and noticed that since her prior UTI, she continued to experience progressive weakness, to the point where her daughter had difficulty mobilize her to toilet her.  She also reports that the patient was asking to urinate frequently, and only passing small amounts of urine.  She also has had increased confusion with some agitation.  Over the past few days, the patient has had anorexia.   ROS:   Constitutional: No fever, no chills;  Appetite diminished; No weight loss, no weight gain, + fatigue.  HEENT: No blurry vision, no diplopia, no pharyngitis, no dysphagia, +rhinorrhea CV: No chest pain, no palpitations, no PND, no orthopnea, no edema.  Resp: No SOB, + cough, no pleuritic pain. GI: No nausea, no vomiting, + diarrhea x 1 today, no melena, no hematochezia, no constipation, no abdominal pain.  GU: No dysuria, no hematuria, no frequency, no urgency. MSK: no myalgias, no arthralgias.  Neuro:  No headache, no focal neurological deficits, + history of seizures.  Psych: + depression, + anxiety.  Endo: No heat intolerance, no cold intolerance, no polyuria, no polydipsia  Skin: No rashes, + sacral decubitus.  Heme: No easy bruising.  Travel history: No recent travel.   Past Medical History:   Past Medical History  Diagnosis Date  . Allergy     allergic rhinitis  . Diabetes mellitus     type II  . Hypertension   . Osteopenia   . TIA (transient ischemic attack)   . Contact dermatitis     on buttocks    . Nosebleed   . Anxiety   . Renal insufficiency   . Pyelonephritis   . Dementia   . Chiari malformation   . Seizure disorder     Past Surgical History:   Past Surgical History  Procedure Laterality Date  . Rotator cuff repair  06/2007    right  . Abdominal hysterectomy    . Back surgery  03/1999    disk  . Breast surgery      breast biopsy x2 negative  . Shoulder surgery  2007    left  . Joint replacement      Rt knee patella replacement  . Colonoscopy  08/28/2012    Procedure: COLONOSCOPY;  Surgeon: Beryle Beams, MD;  Location: [facility] ENDOSCOPY;  Service: Endoscopy;  Laterality: N/A;    Social History:   History   Social History  . Marital Status: Single    Spouse Name: N/A    Number of Children: N/A  . Years of Education: N/A   Occupational History  . Not on file.   Social History Main Topics  . Smoking status: Never Smoker   . Smokeless tobacco: Never Used  . Alcohol Use: No  . Drug Use: No  . Sexual Activity: No   Other Topics Concern  . Not on file   Social History Narrative   Divorced.  Lives with her daughter, [name].  She has two other daughters.  Ambulates with a walker  at baseline.    Family history:   Family History  Problem Relation Age of Onset  . Stroke Mother   . Stroke Father     Allergies   Amoxicillin; Atorvastatin; Cephalexin; Cetirizine hcl; Cortisone; Ezetimibe-simvastatin; Fentanyl; Guaifenesin; Ketorolac tromethamine; Lisinopril; Metformin; Nsaids; Penicillins; Rofecoxib; Rosuvastatin; Simvastatin; Spironolactone; Sulfamethoxazole-tmp ds; Codeine; and Tape  Current Medications:   Prior to Admission medications   Medication Sig Start Date End Date Taking? Authorizing Provider  acetaminophen (TYLENOL) 325 MG tablet Take 650 mg by mouth every 6 (six) hours as needed for mild pain or moderate pain (leg pain).   Yes Historical Provider, MD  alendronate (FOSAMAX) 70 MG tablet Take 70 mg by mouth every 7 (seven) days. Saturday.  Take with a full glass of water on an empty stomach.   Yes Historical Provider, MD  donepezil (ARICEPT) 10 MG tablet Take 20 mg by mouth at bedtime.    Yes Historical Provider, MD  escitalopram (LEXAPRO) 20 MG tablet Take 20 mg by mouth daily. For nerves   Yes Historical Provider, MD  feeding supplement, [place]ER[place] SHAKE, (GLUCERNA SHAKE) LIQD Take 237 mLs by mouth 3 (three) times daily between meals. 04/14/14  Yes Bonnielee Haff, MD  levETIRAcetam (KEPPRA) 500 MG tablet Take 1 tablet (500 mg total) by mouth 2 (two) times daily. 07/12/14  Yes Charlynne Cousins, MD  mirabegron ER (MYRBETRIQ) 25 MG TB24 tablet Take 25 mg by mouth daily.   Yes Historical Provider, MD  nystatin (MYCOSTATIN) powder Apply 1 g topically daily. Apply under breasts and under stomach fold.   Yes Historical Provider, MD  ondansetron (ZOFRAN) 4 MG tablet Take 4 mg by mouth every 8 (eight) hours as needed for nausea or vomiting (nausea).    Yes Historical Provider, MD  oxyCODONE-acetaminophen (PERCOCET) 10-325 MG per tablet Take 1 tablet by mouth every 4 (four) hours as needed for pain (pain).    Yes Historical Provider, MD  risperiDONE (RISPERDAL) 0.5 MG tablet Take 2 tablets (1 mg total) by mouth 2 (two) times daily. 04/14/14  Yes Bonnielee Haff, MD  senna (SENOKOT) 8.6 MG TABS tablet Take 1 tablet by mouth daily as needed for mild constipation (constipation).    Yes Historical Provider, MD  traZODone (DESYREL) 100 MG tablet Take 100 mg by mouth at bedtime.   Yes Historical Provider, MD    Physical Exam:   Filed Vitals:   08/21/14 1700 08/21/14 1730 08/21/14 1830 08/21/14 1900  BP: 137/47 140/53 144/59 129/64  Pulse: 71 77 75   Temp:      TempSrc:      Resp: 22 17 17 18   Height:      Weight:      SpO2: 94% 95% 96%      Physical Exam: Blood pressure 129/64, pulse 75, temperature 97.8 F (36.6 C), temperature source Axillary, resp. rate 18, height 5\' 5"  (1.651 m), weight 113.399 kg (250 lb), SpO2 96.00%. Gen: No acute  distress. Head: Normocephalic, atraumatic. Eyes: PERRL, EOMI, sclerae nonicteric. Mouth: Oropharynx with dry mucous membranes, edentulous with upper and lower dentures. Neck: Supple, no thyromegaly, no lymphadenopathy, no jugular venous distention. Chest: Lungs diminished. CV: Heart sounds are regular.  No M/R/G. Abdomen: Soft, nontender, nondistended with normal active bowel sounds. Extremities: Extremities are without C/E/C. Skin: Warm and dry. Neuro: Alert and disoriented; cranial nerves II through XII grossly intact. Psych: Mood and affect anxious.   Data Review:    Labs: Basic Metabolic Panel:  Recent Labs Lab 08/21/14 1500  NA  142  K 5.0  CL 104  CO2 28  GLUCOSE 117*  BUN 29*  CREATININE 1.61*  CALCIUM 9.6   Liver Function Tests:  Recent Labs Lab 08/21/14 1500  AST 13  ALT 9  ALKPHOS 45  BILITOT 0.3  PROT 6.4  ALBUMIN 3.4*   CBC:  Recent Labs Lab 08/21/14 1500  WBC 10.9*  NEUTROABS 8.5*  HGB 11.1*  HCT 33.8*  MCV 91.6  PLT 227   Cardiac Enzymes:  Recent Labs Lab 08/21/14 1639  TROPONINI <0.30    BNP (last 3 results)  Recent Labs  04/10/14 1855 08/21/14 1640  PROBNP 239.0 90.9   CBG:  Recent Labs Lab 08/21/14 1419  GLUCAP 120*    Radiographic Studies: Dg Chest 2 View  08/21/2014   CLINICAL DATA:  Weakness and decreased oxygen saturation for 3 days.  EXAM: CHEST  2 VIEW  COMPARISON:  PA and lateral chest 07/10/2014 and 06/11/2013.  FINDINGS: There is cardiomegaly without edema. The lungs are clear. No pneumothorax or pleural effusion. Postoperative change of right shoulder replacement and cervical fusion noted.  IMPRESSION: Cardiomegaly without acute disease.   Electronically Signed   By: Inge Rise M.D.   On: 08/21/2014 16:29    EKG: Independently reviewed. NSR at 69 bpm.   Assessment/Plan:   Principal Problem:   UTI (lower urinary tract infection)  Continue empiric Rocephin.  F/U urine culture.  Narrow ABX if  able, once culture results back.  Active Problems:   Disorder of bone and cartilage  Hold Fosamax while in the hospital.    Anxiety / dementia  Continue Aricept/Risperdal.  PT/OT evaluations, likely will need SNF.    H/O Seizure  Continue Keppra.    Acute on chronic renal failure  Baseline creatinine 0.94-1.1.  Current creatinine elevated over usual baseline values.  Gently hydrate.  Likely pre-renal in the setting of decreased PO intake.    Acute encephalopathy  Typical response to UTI.  Supportive care.    DM (diabetes mellitus) type II controlled with renal manifestation / Obesity  Not currently on treatment, diet liberalized.    DVT prophylaxis  Lovenox ordered.  Code Status: DNR, discussed with daughter who confirms this is the patient's wishes. Family Communication: Daughter [name] at the bedside. Disposition Plan: Likely SNF, when stable.  Time spent: 1 hour.  RAMA,CHRISTINA [facility] Pager [phone] Cell: [phone]   If 7PM-7AM, please contact night-coverage [email or web address] Password [email or web address] 08/21/2014, 7:16 PM

## 2014-08-21 NOTE — ED Notes (Signed)
Bed: WA19 Expected date:  Expected time:  Means of arrival:  Comments: EMS- SOB 

## 2014-08-21 NOTE — ED Provider Notes (Signed)
Medical screening examination/treatment/procedure(s) were conducted as a shared visit with non-physician practitioner(s) and myself.  I personally evaluated the patient during the encounter.   EKG Interpretation   Date/Time:  Monday August 21 2014 18:21:59 EDT Ventricular Rate:  69 PR Interval:  166 QRS Duration: 84 QT Interval:  407 QTC Calculation: 436 R Axis:   15 Text Interpretation:  Sinus rhythm Normal ECG No significant change since  last tracing Confirmed by [facility]  MD, Jenny Reichmann ([id number]) on 08/21/2014 6:26:10  PM Also confirmed by [facility]  MD, Jenny Reichmann ([id number]), editor [place]  CCT,  BEVERLY ([id number])  on 08/22/2014 8:01:18 AM     80 year old demented female lives at home with her daughter her daughter is no longer able to care the patient home for the last few weeks the patient has had ongoing urinary frequency with UTI patient is getting weaker and now requires too much assistance despite advanced home care and the daughter taking care of the patient at home and daughter is considering placement for the patient since the patient is generally weak and can no longer walk without assistance despite having a walker.  Babette Relic, MD 09/10/14 [phone]

## 2014-08-21 NOTE — ED Provider Notes (Signed)
CSN: [id number]     Arrival date & time 08/21/14  1401 History   First MD Initiated Contact with Patient 08/21/14 1511     Chief Complaint  Patient presents with  . Weakness     (Consider location/radiation/quality/duration/timing/severity/associated sxs/prior Treatment) The history is provided by the patient and a caregiver. No language interpreter was used.  Linda [name] is an 80 y/o F with PMhx of DM, HTN, TIA, seizures, osteopenia, dementia, Chiari Malformation, renal insufficiency presenting to the ED with generalized weakness that has been ongoing for the past 3 days. Per patient reported that she feels "tired" all over. As per daughter and sitter, stated that patient normally walks with a walker to the bathroom, but stated that she has not been able to do so for the past 3 days. Sitter reported that patient has not been decreased appetite and drinking for the past 3 days as well. Patient reported that she continues to feel as if she needs to go to the bathroom. Sitter and daughter reported that patient was diagnosed with a UTI not too long ago and was placed on Keflex and Ciprofloxacin that was completed Thursday. Sitter also reported that oxygen therapy via nasal cannula had to be increased since Saturday from 2 L/min to 2.5 L/min secondary to drop in pulse ox - daughter reported that patient has been on oxygen therapy due to hypoxia at night since March, but just recently over the past couple of weeks had to increase from night to 24/7. Sitter reported that patient was seen and assessed on Saturday by PCP where blood and urine was drawn, but no results have come back - were recommended by PCP to come to the ED to be assessed. Sitter reported that patient does have history of dementia, but her mental status has changed recently. Last seizure activity end of August, beginning of September - patient on Keppra. ROS could not be performed secondary to patient's dementia history - denied chest pain,  difficulty breathing, fever, chills, abdominal pain, numbness, tingling, cough. Sitter denied fall and injury. No form of blood thinners. Lives at home with daughter.  PCP Dr. Lisbeth Ply  Level 5 caveat   Past Medical History  Diagnosis Date  . Allergy     allergic rhinitis  . Diabetes mellitus     type II  . Hypertension   . Osteopenia   . TIA (transient ischemic attack)   . Contact dermatitis     on buttocks  . Nosebleed   . Anxiety   . Renal insufficiency   . Pyelonephritis   . Dementia   . Chiari malformation   . Seizure disorder    Past Surgical History  Procedure Laterality Date  . Rotator cuff repair  06/2007    right  . Abdominal hysterectomy    . Back surgery  03/1999    disk  . Breast surgery      breast biopsy x2 negative  . Shoulder surgery  2007    left  . Joint replacement      Rt knee patella replacement  . Colonoscopy  08/28/2012    Procedure: COLONOSCOPY;  Surgeon: Beryle Beams, MD;  [facility] ENDOSCOPY;  Service: Endoscopy;  Laterality: N/A;   Family History  Problem Relation Age of Onset  . Stroke Mother   . Stroke Father    History  Substance Use Topics  . Smoking status: Never Smoker   . Smokeless tobacco: Never Used  . Alcohol Use: No  OB History   Grav Para Term Preterm Abortions TAB SAB Ect Mult Living                 Review of Systems  Unable to perform ROS: Dementia  Constitutional: Negative for fever.  Respiratory: Positive for shortness of breath. Negative for cough.   Cardiovascular: Negative for chest pain.  Gastrointestinal: Negative for nausea, vomiting and abdominal pain.  Genitourinary: Positive for urgency.  Musculoskeletal: Negative for neck pain and neck stiffness.  Neurological: Positive for weakness (generalized).      Allergies  Amoxicillin; Atorvastatin; Cephalexin; Cetirizine hcl; Cortisone; Ezetimibe-simvastatin; Fentanyl; Guaifenesin; Ketorolac tromethamine; Lisinopril; Metformin; Nsaids; Penicillins;  Rofecoxib; Rosuvastatin; Simvastatin; Spironolactone; Sulfamethoxazole-tmp ds; Codeine; and Tape  Home Medications   Prior to Admission medications   Medication Sig Start Date End Date Taking? Authorizing Provider  acetaminophen (TYLENOL) 325 MG tablet Take 650 mg by mouth every 6 (six) hours as needed for mild pain or moderate pain (leg pain).   Yes Historical Provider, MD  alendronate (FOSAMAX) 70 MG tablet Take 70 mg by mouth every 7 (seven) days. Saturday. Take with a full glass of water on an empty stomach.   Yes Historical Provider, MD  donepezil (ARICEPT) 10 MG tablet Take 20 mg by mouth at bedtime.    Yes Historical Provider, MD  escitalopram (LEXAPRO) 20 MG tablet Take 20 mg by mouth daily. For nerves   Yes Historical Provider, MD  feeding supplement, GLUCERNA SHAKE, (GLUCERNA SHAKE) LIQD Take 237 mLs by mouth 3 (three) times daily between meals. 04/14/14  Yes Bonnielee Haff, MD  levETIRAcetam (KEPPRA) 500 MG tablet Take 1 tablet (500 mg total) by mouth 2 (two) times daily. 07/12/14  Yes Charlynne Cousins, MD  mirabegron ER (MYRBETRIQ) 25 MG TB24 tablet Take 25 mg by mouth daily.   Yes Historical Provider, MD  nystatin (MYCOSTATIN) powder Apply 1 g topically daily. Apply under breasts and under stomach fold.   Yes Historical Provider, MD  ondansetron (ZOFRAN) 4 MG tablet Take 4 mg by mouth every 8 (eight) hours as needed for nausea or vomiting (nausea).    Yes Historical Provider, MD  oxyCODONE-acetaminophen (PERCOCET) 10-325 MG per tablet Take 1 tablet by mouth every 4 (four) hours as needed for pain (pain).    Yes Historical Provider, MD  risperiDONE (RISPERDAL) 0.5 MG tablet Take 2 tablets (1 mg total) by mouth 2 (two) times daily. 04/14/14  Yes Bonnielee Haff, MD  senna (SENOKOT) 8.6 MG TABS tablet Take 1 tablet by mouth daily as needed for mild constipation (constipation).    Yes Historical Provider, MD  traZODone (DESYREL) 100 MG tablet Take 100 mg by mouth at bedtime.   Yes Historical  Provider, MD   BP 129/64  Pulse 75  Temp(Src) 97.8 F (36.6 C) (Oral)  Resp 18  Ht 5\' 5"  (1.651 m)  Wt 250 lb (113.399 kg)  BMI 41.60 kg/m2  SpO2 96% Physical Exam  Nursing note and vitals reviewed. Constitutional: She appears well-developed and well-nourished. No distress.  HENT:  Head: Atraumatic.  Eyes: Conjunctivae and EOM are normal. Pupils are equal, round, and reactive to light. Right eye exhibits no discharge. Left eye exhibits no discharge.  Neck: Normal range of motion. Neck supple. No tracheal deviation present.  Cardiovascular: Normal rate, regular rhythm and normal heart sounds.  Exam reveals no friction rub.   No murmur heard. Cap refill < 3 seconds Negative swelling or pitting edema noted to the lower extremities bilaterally   Pulmonary/Chest: Effort  normal and breath sounds normal. No respiratory distress. She has no wheezes. She has no rales. She exhibits no tenderness.  Patient is able to speak in full sentences without difficulty  Negative use of accessory muscles Negative stridor  Abdominal: Soft. Bowel sounds are normal. She exhibits no distension. There is tenderness. There is no rebound and no guarding.  Obese BS normoactive in all 4 quadrants Negative abdominal distension  Discomfort upon palpation to the periumbilical region of the abdomen  Negative peritoneal signs Negative rigidity  Guarding upon palpation - voluntary  Lymphadenopathy:    She has no cervical adenopathy.  Neurological: She is alert. She exhibits normal muscle tone. Coordination normal.  Patient looks at this provider when name is called Patient is slow to respond - slow speech Cranial nerves III-XII grossly intact Patient follows commands wel Equal grip strength bilaterally - both weak Negative arm drift Negative facial drooping Negative slurred speech  Negative aphasia  Skin: Skin is warm and dry. No rash noted. She is not diaphoretic. No erythema.  Candidal-appearing rash  noted to the panniculus of the abdomen.  Psychiatric: She has a normal mood and affect. Her behavior is normal. Thought content normal.    ED Course  Procedures (including critical care time)  5:54 PM This provider spoke with Dr. Rockne Menghini - discussed case in great detail, labs, imaging, vitals. Agreed to admission to [facility]. Recommended lactic acid to be ordered.  Results for orders placed during the hospital encounter of 08/21/14  COMPREHENSIVE METABOLIC PANEL      Result Value Ref Range   Sodium 142  137 - 147 mEq/L   Potassium 5.0  3.7 - 5.3 mEq/L   Chloride 104  96 - 112 mEq/L   CO2 28  19 - 32 mEq/L   Glucose, Bld 117 (*) 70 - 99 mg/dL   BUN 29 (*) 6 - 23 mg/dL   Creatinine, Ser 1.61 (*) 0.50 - 1.10 mg/dL   Calcium 9.6  8.4 - 10.5 mg/dL   Total Protein 6.4  6.0 - 8.3 g/dL   Albumin 3.4 (*) 3.5 - 5.2 g/dL   AST 13  0 - 37 U/L   ALT 9  0 - 35 U/L   Alkaline Phosphatase 45  39 - 117 U/L   Total Bilirubin 0.3  0.3 - 1.2 mg/dL   GFR calc non Af Amer 29 (*) >90 mL/min   GFR calc Af Amer 33 (*) >90 mL/min   Anion gap 10  5 - 15  CBC WITH DIFFERENTIAL      Result Value Ref Range   WBC 10.9 (*) 4.0 - 10.5 K/uL   RBC 3.69 (*) 3.87 - 5.11 MIL/uL   Hemoglobin 11.1 (*) 12.0 - 15.0 g/dL   HCT 33.8 (*) 36.0 - 46.0 %   MCV 91.6  78.0 - 100.0 fL   MCH 30.1  26.0 - 34.0 pg   MCHC 32.8  30.0 - 36.0 g/dL   RDW 14.2  11.5 - 15.5 %   Platelets 227  150 - 400 K/uL   Neutrophils Relative % 78 (*) 43 - 77 %   Neutro Abs 8.5 (*) 1.7 - 7.7 K/uL   Lymphocytes Relative 14  12 - 46 %   Lymphs Abs 1.6  0.7 - 4.0 K/uL   Monocytes Relative 6  3 - 12 %   Monocytes Absolute 0.6  0.1 - 1.0 K/uL   Eosinophils Relative 2  0 - 5 %   Eosinophils Absolute 0.2  0.0 - 0.7 K/uL   Basophils Relative 0  0 - 1 %   Basophils Absolute 0.0  0.0 - 0.1 K/uL  URINALYSIS, ROUTINE W REFLEX MICROSCOPIC      Result Value Ref Range   Color, Urine AMBER (*) YELLOW   APPearance CLOUDY (*) CLEAR   Specific Gravity, Urine  1.021  1.005 - 1.030   pH 5.0  5.0 - 8.0   Glucose, UA NEGATIVE  NEGATIVE mg/dL   Hgb urine dipstick SMALL (*) NEGATIVE   Bilirubin Urine SMALL (*) NEGATIVE   Ketones, ur NEGATIVE  NEGATIVE mg/dL   Protein, ur 30 (*) NEGATIVE mg/dL   Urobilinogen, UA 0.2  0.0 - 1.0 mg/dL   Nitrite NEGATIVE  NEGATIVE   Leukocytes, UA LARGE (*) NEGATIVE  TROPONIN I      Result Value Ref Range   Troponin I <0.30  <0.30 ng/mL  PRO B NATRIURETIC PEPTIDE      Result Value Ref Range   Pro B Natriuretic peptide (BNP) 90.9  0 - 450 pg/mL  URINE MICROSCOPIC-ADD ON      Result Value Ref Range   Squamous Epithelial / LPF RARE  RARE   WBC, UA TOO NUMEROUS TO COUNT  <3 WBC/hpf   RBC / HPF 3-6  <3 RBC/hpf   Bacteria, UA FEW (*) RARE   Casts HYALINE CASTS (*) NEGATIVE  CBG MONITORING, ED      Result Value Ref Range   Glucose-Capillary 120 (*) 70 - 99 mg/dL  I-STAT CG4 LACTIC ACID, ED      Result Value Ref Range   Lactic Acid, Venous 0.71  0.5 - 2.2 mmol/L    Labs Review Labs Reviewed  COMPREHENSIVE METABOLIC PANEL - Abnormal; Notable for the following:    Glucose, Bld 117 (*)    BUN 29 (*)    Creatinine, Ser 1.61 (*)    Albumin 3.4 (*)    GFR calc non Af Amer 29 (*)    GFR calc Af Amer 33 (*)    All other components within normal limits  CBC WITH DIFFERENTIAL - Abnormal; Notable for the following:    WBC 10.9 (*)    RBC 3.69 (*)    Hemoglobin 11.1 (*)    HCT 33.8 (*)    Neutrophils Relative % 78 (*)    Neutro Abs 8.5 (*)    All other components within normal limits  URINALYSIS, ROUTINE W REFLEX MICROSCOPIC - Abnormal; Notable for the following:    Color, Urine AMBER (*)    APPearance CLOUDY (*)    Hgb urine dipstick SMALL (*)    Bilirubin Urine SMALL (*)    Protein, ur 30 (*)    Leukocytes, UA LARGE (*)    All other components within normal limits  URINE MICROSCOPIC-ADD ON - Abnormal; Notable for the following:    Bacteria, UA FEW (*)    Casts HYALINE CASTS (*)    All other components  within normal limits  CBG MONITORING, ED - Abnormal; Notable for the following:    Glucose-Capillary 120 (*)    All other components within normal limits  URINE CULTURE  TROPONIN I  PRO B NATRIURETIC PEPTIDE  CBC  CREATININE, SERUM  CBG MONITORING, ED  I-STAT CG4 LACTIC ACID, ED    Imaging Review Dg Chest 2 View  08/21/2014   CLINICAL DATA:  Weakness and decreased oxygen saturation for 3 days.  EXAM: CHEST  2 VIEW  COMPARISON:  PA and lateral chest 07/10/2014 and  06/11/2013.  FINDINGS: There is cardiomegaly without edema. The lungs are clear. No pneumothorax or pleural effusion. Postoperative change of right shoulder replacement and cervical fusion noted.  IMPRESSION: Cardiomegaly without acute disease.   Electronically Signed   By: Inge Rise M.D.   On: 08/21/2014 16:29     EKG Interpretation   Date/Time:  Monday August 21 2014 18:21:59 EDT Ventricular Rate:  69 PR Interval:  166 QRS Duration: 84 QT Interval:  407 QTC Calculation: 436 R Axis:   15 Text Interpretation:  Sinus rhythm Normal ECG No significant change since  last tracing Confirmed by [facility]  MD, Jenny Reichmann ([id number]) on 08/21/2014 6:26:10  PM      MDM   Final diagnoses:  UTI (lower urinary tract infection)  Hypoxia  On home oxygen therapy  Weakness generalized    Medications  acetaminophen (TYLENOL) tablet 650 mg (not administered)  levETIRAcetam (KEPPRA) tablet 500 mg (not administered)  mirabegron ER (MYRBETRIQ) tablet 25 mg (not administered)  oxyCODONE-acetaminophen (PERCOCET) 10-325 MG per tablet 1 tablet (not administered)  traZODone (DESYREL) tablet 100 mg (not administered)  feeding supplement (GLUCERNA SHAKE) (GLUCERNA SHAKE) liquid 237 mL (not administered)  nystatin (MYCOSTATIN) powder 1 g (not administered)  ondansetron (ZOFRAN) tablet 4 mg (not administered)  risperiDONE (RISPERDAL) tablet 1 mg (not administered)  senna (SENOKOT) tablet 8.6 mg (not administered)  donepezil (ARICEPT)  tablet 20 mg (not administered)  escitalopram (LEXAPRO) tablet 20 mg (not administered)  enoxaparin (LOVENOX) injection 40 mg (not administered)  0.9 %  sodium chloride infusion (not administered)  alum & mag hydroxide-simeth (MAALOX/MYLANTA) 200-200-20 MG/5ML suspension 30 mL (not administered)  cefTRIAXone (ROCEPHIN) 1 g in dextrose 5 % 50 mL IVPB (not administered)  sodium chloride 0.9 % bolus 500 mL (0 mLs Intravenous Stopped 08/21/14 1928)  cefTRIAXone (ROCEPHIN) 1 g in dextrose 5 % 50 mL IVPB (0 g Intravenous Stopped 08/21/14 1928)    Filed Vitals:   08/21/14 1730 08/21/14 1830 08/21/14 1900 08/21/14 1929  BP: 140/53 144/59 129/64   Pulse: 77 75    Temp:    97.8 F (36.6 C)  TempSrc:    Oral  Resp: 17 17 18    Height:      Weight:      SpO2: 95% 96%      EKG noted normal sinus rhythm with a heart rate is 60 beats per minute. Troponin negative elevation. BNP negative elevation. CBC noted mildly elevated white cell count 10.9. Hemoglobin 11.1, hematocrit 33.8-patient chronically anemic. CMP noted elevated BUN of 29, creatinine 1.61-when compared to previous labs, patient's creatinine has been as high as 1.57. CBG 120. Urinalysis noted negative nitrites with large leukocytes with white blood cell count to many to count-clumps noted - small amount of hemoglobin. Urine culture pending. Chest x-ray negative for acute cardiopulmonary disease. Patient presenting to emergency department with chronic urinary tract infection that has not resolved with outpatient antibiotics along with weakness generalized and increased hypoxia. Patient seen and assessed by attending physician, Dr. Stevie Kern who agrees to plan of discharge. Patient given IV fluids secondary to appearing mildly dehydrated as well as one round of IV antibiotics as per attending physician's request - chart reviewed and when patient was admitted to the hospital in June 2015 patient on given IV ceftriaxone with no issues or reactions.  Patient admitted to the hospital for chronic UTI, increased hypoxia, generalized weakness. Patient stable, afebrile. Patient not septic appearing. Patient admitted to [facility]. Patient stable for transfer.  Jamse Mead, PA-C 08/21/14  1950 

## 2014-08-21 NOTE — ED Notes (Signed)
PT HERE END OF SEPT. FOR MINI SEIZURES / UTI. NO SEIZURE ACTIVITY OVER LAST 3 DAYS. GENERALIZED WEAKNESS. FAMILY STATES URINARY FREQUENCY WITH VERY LITTLE OUTPUT. DENIES ODOR.

## 2014-08-21 NOTE — ED Notes (Signed)
Per EMS-weakness and low O2 for about 3 days

## 2014-08-21 NOTE — ED Notes (Signed)
Pt's LOC X's 1, Lung sounds are clear and equal bilaterally,skin cool and dry.

## 2014-08-21 NOTE — Progress Notes (Signed)
  CARE MANAGEMENT ED NOTE 08/21/2014  Patient:  Linda [name], Linda [name]   Account Number:  [id number]  Date Initiated:  08/21/2014  Documentation initiated by:  Livia Snellen  Subjective/Objective Assessment:   Patient presents to Ed with urinary urgency, increased weakness and low oxygen sat.     Subjective/Objective Assessment Detail:   Patient with pmhx of DM, HTN, TIA, renal issuficciency, dementia, pyelonephritis.  Patient finished course of Cipro and Keflex at home without improvement.     Action/Plan:   Action/Plan Detail:   Anticipated DC Date:       Status Recommendation to Physician:   Result of Recommendation:    Other ED Services  Consult Working [place]  CM consult  Other    Choice offered to / List presented to:  C-4 Adult Children         [place].    Status of service:  Completed, signed off  ED Comments:   ED Comments Detail:  EDCM spoke to patient and her daughter Linda [name] at bedside. Patient sleeping at this time.  Patient's daughter Linda [name] is patient's POA (w) [phone], (c) [phone] and (h) [phone].  Patient lives at home with her daughter Linda [name].  Patient is currently receiving home halth services with [facility] for an RN per patient's daughter. Visiting RN is addressing sacral debubitis ulcer.  PT, and home health aide no longer are coming to patient's home. Patient's daughter reports a family friend comes and stays with the patient Mon-Fri from 11am to 4pm.  Patient wears oxygen at home at 2 liters supplied by [facility].  Patient has a walker, hospital bed, wheelchair, transpot chair, bedside commode, shower chair and safety rails in the bathroom at home.  Patient's daughter reports they are interested and looking into [facility] for the patient.  SW consult placed.  Patient's daughter Linda [name] reports the patient is unable to walk now.  Patient is usually ambulatory with a walker but patient's daughter has been  carrying the patient to the bathroom.  Patient's daughter also reports patient's appetite has decreased and is very sleepy. EDCM provided patient's daughter with list of home health agencies in [place]  highlighting [facility]. Patient's daughter thankful for assistance.  No further EDCM needs at this time.

## 2014-08-22 DIAGNOSIS — Z9981 Dependence on supplemental oxygen: Secondary | ICD-10-CM

## 2014-08-22 DIAGNOSIS — R0902 Hypoxemia: Secondary | ICD-10-CM

## 2014-08-22 LAB — BASIC METABOLIC PANEL
ANION GAP: 13 (ref 5–15)
BUN: 24 mg/dL — ABNORMAL HIGH (ref 6–23)
CO2: 26 mEq/L (ref 19–32)
Calcium: 9.1 mg/dL (ref 8.4–10.5)
Chloride: 104 mEq/L (ref 96–112)
Creatinine, Ser: 1.18 mg/dL — ABNORMAL HIGH (ref 0.50–1.10)
GFR calc Af Amer: 48 mL/min — ABNORMAL LOW (ref >90)
GFR, EST NON AFRICAN AMERICAN: 42 mL/min — AB (ref >90)
GLUCOSE: 137 mg/dL — AB (ref 70–99)
POTASSIUM: 4.4 meq/L (ref 3.7–5.3)
SODIUM: 143 meq/L (ref 137–147)

## 2014-08-22 LAB — CBC
HCT: 32.7 % — ABNORMAL LOW (ref 36.0–46.0)
Hemoglobin: 10.5 g/dL — ABNORMAL LOW (ref 12.0–15.0)
MCH: 29.5 pg (ref 26.0–34.0)
MCHC: 32.1 g/dL (ref 30.0–36.0)
MCV: 91.9 fL (ref 78.0–100.0)
PLATELETS: 209 10*3/uL (ref 150–400)
RBC: 3.56 MIL/uL — AB (ref 3.87–5.11)
RDW: 13.9 % (ref 11.5–15.5)
WBC: 6.6 10*3/uL (ref 4.0–10.5)

## 2014-08-22 LAB — GLUCOSE, CAPILLARY
GLUCOSE-CAPILLARY: 116 mg/dL — AB (ref 70–99)
GLUCOSE-CAPILLARY: 105 mg/dL — AB (ref 70–99)

## 2014-08-22 NOTE — Evaluation (Signed)
Occupational Therapy Evaluation Patient Details Name: Linda [name] MRN: [id number] DOB: 02-Oct-1930 Today's Date: 08/22/2014    History of Present Illness 80 yo female admitted with UTI, weakness. Hx of DM, HTN, TIA, Sz, osteopenia, dementia, chiari malformation, renal insufficiency, sacral ulcer   Clinical Impression   Pt was admitted for a uti and weakness.  At baseline, she feeds herself, gets to commode and performs hygiene.  Pt currently needs A x 2 for transfers (per PT) and needs A x 2 for adls at bed level.  She lives with daughter and her daughter was having much more difficulty assisting her than baseline.  Goals are set at min A for bed mobility and toilet transfers.    Follow Up Recommendations  SNF    Equipment Recommendations  3 in 1 bedside comode    Recommendations for Other Services       Precautions / Restrictions Precautions Precautions: Fall Restrictions Weight Bearing Restrictions: No      Mobility Bed Mobility Overal bed mobility: Needs Assistance Bed Mobility: Supine to Sit;Sit to Supine Rolling: Mod assist;Max assist (max to L)       General bed mobility comments: increased time  Transfers            General transfer comment: not tested with OT    Balance                                         ADL Overall ADL's : Needs assistance/impaired                                       General ADL Comments: Pt wanted to use bedpan--mod A to roll to place bedpan and total assist x 2 needed for hygiene--(one person to help hold pt).  Pt did get up to [facility] earlier, but wanted to use bedpan when daughter gave her the choice.  Pt delayed responses to commands.  She was able to express that gown got wet.     Vision                     Perception     Praxis      Pertinent Vitals/Pain Pain Assessment: Faces Pain Score: 0-No pain     Hand Dominance     Extremity/Trunk Assessment Upper  Extremity Assessment Upper Extremity Assessment: RUE deficits/detail;Generalized weakness RUE Deficits / Details: daughter reports pt has a problem with R shoulder.  AAROM to 20 to don new gown   Lower Extremity Assessment Lower Extremity Assessment: Generalized weakness   Cervical / Trunk Assessment Cervical / Trunk Assessment: Kyphotic   Communication Communication Communication: HOH   Cognition Arousal/Alertness: Awake/alert Behavior During Therapy: WFL for tasks assessed/performed Overall Cognitive Status: Impaired/Different from baseline Area of Impairment: Following commands               General Comments: daughter present and reports pt is worse than baseline   General Comments       Exercises       Shoulder Instructions      Home Living Family/patient expects to be discharged to:: Skilled nursing facility  Prior Functioning/Environment Level of Independence: Needs assistance        Comments: pt feeds herself, gets to commode and performs hygiene.  Has assistance with all other adls  Daughter reports that care became much more difficult:  She was getting her into a w/c and rolling her into bathroom, just prior to admission.      OT Diagnosis: Generalized weakness;Cognitive deficits   OT Problem List: Decreased strength;Decreased activity tolerance;Impaired balance (sitting and/or standing);Decreased cognition;Obesity;Impaired UE functional use   OT Treatment/Interventions: Self-care/ADL training;DME and/or AE instruction;Therapeutic activities;Cognitive remediation/compensation;Patient/family education;Balance training    OT Goals(Current goals can be found in the care plan section) Acute Rehab OT Goals Patient Stated Goal: daughter wants pt to get back to baseline OT Goal Formulation: With family Time For Goal Achievement: 09/05/14 Potential to Achieve Goals: Good  OT Frequency: Min 2X/week    Barriers to D/C:            Co-evaluation              End of Session    Activity Tolerance: Patient tolerated treatment well Patient left: in bed;with call bell/phone within reach   Time: 5498-2641 OT Time Calculation (min): 21 min Charges:  OT General Charges $OT Visit: 1 Procedure OT Evaluation $Initial OT Evaluation Tier I: 1 Procedure OT Treatments $Self Care/Home Management : 8-22 mins G-Codes:    Rochelle Larue Sep 21, 2014, 5:09 PM Lesle Chris, OTR/L [phone] 09/21/2014

## 2014-08-22 NOTE — Progress Notes (Signed)
Clinical Social Work Department BRIEF PSYCHOSOCIAL ASSESSMENT 08/22/2014  Patient:  Linda [name], Linda [name]     Account Number:  [id number]     Admit date:  08/21/2014  Clinical Social Worker:  Ulyess Blossom  Date/Time:  08/22/2014 02:30 PM  Referred by:  Physician  Date Referred:  08/22/2014 Referred for  SNF Placement   Other Referral:   Interview type:  Family Other interview type:    PSYCHOSOCIAL DATA Living Status:  FAMILY Admitted from facility:   Level of care:   Primary support name:  Linda [name]/daughter/[id number] Primary support relationship to patient:  CHILD, ADULT Degree of support available:   strong    CURRENT CONCERNS Current Concerns  Post-Acute Placement   Other Concerns:    SOCIAL WORK ASSESSMENT / PLAN CSW received referral for New SNF.    CSW visited pt room and pt caregiver at bedside. Pt caregiver reports that pt daughter, Linda Look is primary caregiver and can be reached by phone. CSW contacted pt daughter, Linda Look via telephone. CSW introduced self and explained role. Pt daughter discussed that pt lives with her. Pt daughter shared that she provides the majority of care for pt, but a caregiver comes to her home from 11am to 4 pm. CSW provided support as pt daughter discussed that pt care is becoming difficult to handle at home. Pt daughter shared that pt had home health RN/Aide/PT, but the visits have now stopped and pt daughter feels that pt will need SNF at this time. Pt daughter shared that pt other daughter, Linda [name] has been in the process of applying for Medicaid for pt as they are aware that pt will need Medicaid for long term care. Pt daughther expressed that she hopes that pt will not be discharged to early as pt daughter felt that pt was discharged too early from last hospitalization. Pt daughter discussed that pt has increased confusion in the hospital and CSW provided psychoeducation surrounding pt with dementia and UTI and change in enviornment can  impact pt mental status. Pt daughter expressed understanding. Pt daughter agreeable to [facility] search, but prefer [facility] or [facility].    CSW contacted pt daughter, Linda [name] via telephone. CSW introduced self and explained role. Pt daughter, Linda [name] states that she has left messages for pt medicaid caseworker as pt daughter applied for Medicaid for long term care a few months ago, but did not finish application process as pt returned home. CSW clarified pt daughter's questions regarding Medicaid and coverage for long term care. Pt daughters appreciative of assistance and hopeful for [facility].    CSW completed FL2 and initiated SNF search to [facility].    CSW contacted [facility] and left voice message expressing pt daughter's interest in the facility.    CSW to follow up with pt daughter re: SNF bed offers.    CSW to continue to follow, provide support, and assist with pt discharge planning needs.   Assessment/plan status:  Psychosocial Support/Ongoing Assessment of Needs Other assessment/ plan:   discharge planning   Information/referral to community resources:   [facility] search    PATIENT'S/FAMILY'S RESPONSE TO PLAN OF CARE: Pt oriented to person only. Pt daughters supportive and actively involved in pt care. Pt daughter was hopeful to keep pt at home for as long as possible, but recognizes that the care has become difficult to manage for pt daughter at home.    Linda [name], MSW, [place] Work [phone]

## 2014-08-22 NOTE — Progress Notes (Signed)
Progress Note   Linda [name]:[id number] DOB: 1930-04-15 DOA: 08/21/2014 PCP: Leonides Sake, MD   Brief Narrative:   Linda [name] is an 80 y.o. female with a PMH of dementia, seizure disorder on Keppra, DM type II (diet controlled), frequent UTI  who was admitted 08/21/14 with acute encephalopathy from recurrent UTI.  Assessment/Plan:   Principal Problem:  UTI (lower urinary tract infection)  Continue empiric Rocephin.  F/U urine culture. Narrow ABX if able, once culture results back.  Active Problems:  Disorder of bone and cartilage  Hold Fosamax while in the hospital.  Anxiety / dementia  Continue Aricept/Risperdal.  PT/OT evaluations, likely will need SNF.  H/O Seizure  Continue Keppra.  Acute on chronic renal failure  Baseline creatinine 0.94-1.1. Current creatinine elevated over usual baseline values.  Gently hydrate. Likely pre-renal in the setting of decreased PO intake.  Acute encephalopathy  Typical response to UTI.  Supportive care.  DM (diabetes mellitus) type II controlled with renal manifestation / Obesity  Not currently on treatment, diet liberalized.  DVT prophylaxis  Lovenox ordered.  Code Status: DNR, discussed with daughter who confirms this is the patient's wishes.  Family Communication: Daughter [name] at the bedside 08/21/14, sitter today.  Disposition Plan: Likely SNF, when stable.    IV Access:    Peripheral IV   Procedures and diagnostic studies:   Dg Chest 2 View 08/21/2014: Cardiomegaly without acute disease.     Medical Consultants:    None.   Other Consultants:    None.   Anti-Infectives:    Rocephin 08/21/14--->  Subjective:   Linda [name] is still confused.  No complaints.  Sitter at the bedside.    Objective:    Filed Vitals:   08/21/14 1900 08/21/14 1929 08/21/14 1949 08/22/14 0653  BP: 129/64  146/88 133/68  Pulse:   70 70  Temp:  97.8 F (36.6 C) 97.5 F (36.4 C) 98.2 F (36.8  C)  TempSrc:  Oral Oral Oral  Resp: 18  16 16   Height:   5\' 5"  (1.651 m)   Weight:   96.798 kg (213 lb 6.4 oz)   SpO2:   100% 95%    Intake/Output Summary (Last 24 hours) at 08/22/14 1204 Last data filed at 08/22/14 0900  Gross per 24 hour  Intake   1060 ml  Output    250 ml  Net    810 ml    Exam: Gen:  NAD, weak Cardiovascular:  RRR, No M/R/G Respiratory:  Lungs CTAB Gastrointestinal:  Abdomen soft, NT/ND, + BS Extremities:  No C/E/C   Data Reviewed:    Labs: Basic Metabolic Panel:  Recent Labs Lab 08/21/14 1500 08/22/14 0928  NA 142 143  K 5.0 4.4  CL 104 104  CO2 28 26  GLUCOSE 117* 137*  BUN 29* 24*  CREATININE 1.61* 1.18*  CALCIUM 9.6 9.1   GFR Estimated Creatinine Clearance: 42.3 ml/min (by C-G formula based on Cr of 1.18). Liver Function Tests:  Recent Labs Lab 08/21/14 1500  AST 13  ALT 9  ALKPHOS 45  BILITOT 0.3  PROT 6.4  ALBUMIN 3.4*   CBC:  Recent Labs Lab 08/21/14 1500 08/22/14 0928  WBC 10.9* 6.6  NEUTROABS 8.5*  --   HGB 11.1* 10.5*  HCT 33.8* 32.7*  MCV 91.6 91.9  PLT 227 209   Cardiac Enzymes:  Recent Labs Lab 08/21/14 1639  TROPONINI <0.30   BNP (last 3 results)  Recent Labs  04/10/14 1855 08/21/14 1640  PROBNP 239.0 90.9   CBG:  Recent Labs Lab 08/21/14 1419 08/22/14 0755 08/22/14 1141  GLUCAP 120* 116* 105*   D-Dimer: Sepsis Labs:  Recent Labs Lab 08/21/14 1500 08/21/14 1904 08/22/14 0928  WBC 10.9*  --  6.6  LATICACIDVEN  --  0.71  --    Microbiology No results found for this or any previous visit (from the past 240 hour(s)).   Medications:   . cefTRIAXone (ROCEPHIN)  IV  1 g Intravenous Q24H  . donepezil  20 mg Oral QHS  . enoxaparin (LOVENOX) injection  40 mg Subcutaneous Q24H  . escitalopram  20 mg Oral Daily  . feeding supplement (GLUCERNA SHAKE)  237 mL Oral TID BM  . levETIRAcetam  500 mg Oral BID  . mirabegron ER  25 mg Oral Daily  . nystatin  1 g Topical Daily  .  risperiDONE  1 mg Oral BID  . traZODone  100 mg Oral QHS   Continuous Infusions: . sodium chloride 50 mL/hr at 08/21/14 2027    Time spent: 25 minutes.   LOS: 1 day   Dong Nimmons  [facility] Pager [phone]. If unable to reach me by pager, please call my cell phone at [phone].  *Please refer to [email or web address], password [email or web address] to get updated schedule on who will round on this patient, as hospitalists switch teams weekly. If 7PM-7AM, please contact night-coverage at [email or web address], password [email or web address] for any overnight needs.  08/22/2014, 12:04 PM

## 2014-08-22 NOTE — Care Management Note (Signed)
CARE MANAGEMENT NOTE 08/22/2014  Patient:  Linda [name], Linda [name]   Account Number:  [id number]  Date Initiated:  08/22/2014  Documentation initiated by:  Marney Doctor  Subjective/Objective Assessment:   80 yo admitted with UTI     Action/Plan:   From home with [facility]   Anticipated DC Date:  08/25/2014   Anticipated DC Plan:  SKILLED NURSING FACILITY  In-house referral  Clinical Social Worker      DC Planning Services  CM consult      Choice offered to / List presented to:             Status of service:  In process, will continue to follow Medicare Important Message given?   (If response is "NO", the following Medicare IM given date fields will be blank) Date Medicare IM given:   Medicare IM given by:   Date Additional Medicare IM given:   Additional Medicare IM given by:    Discharge Disposition:    Per UR Regulation:  Reviewed for med. necessity/level of care/duration of stay  If discussed at [place] of Stay Meetings, dates discussed:    Comments:  08/22/14 Marney Doctor RN,BSN,NCM [phone] Please see [facility] note.  PT recommendation is SNF.  [facility] rep informed of pt being admitted.  CM will continue to follow.

## 2014-08-22 NOTE — Progress Notes (Addendum)
Clinical Social Work Department CLINICAL SOCIAL WORK PLACEMENT NOTE 08/22/2014  Patient:  Linda [name], Linda [name]  Account Number:  [id number] Admit date:  08/21/2014  Clinical Social Worker:  Ulyess Blossom  Date/time:  08/22/2014 02:59 PM  Clinical Social Work is seeking post-discharge placement for this patient at the following level of care:   SKILLED NURSING   (*CSW will update this form in Epic as items are completed)   08/22/2014  Patient/family provided with [place] Department of Clinical Social Work's list of facilities offering this level of care within the geographic area requested by the patient (or if unable, by the patient's family).  08/22/2014  Patient/family informed of their freedom to choose among providers that offer the needed level of care, that participate in Medicare, Medicaid or managed care program needed by the patient, have an available bed and are willing to accept the patient.  08/22/2014  Patient/family informed of [facility]' ownership interest in [facility], as well as of the fact that they are under no obligation to receive care at this facility.  PASARR submitted to EDS on 08/22/2014 PASARR number received on 08/22/2014  FL2 transmitted to all facilities in geographic area requested by pt/family on  08/22/2014 FL2 transmitted to all facilities within larger geographic area on   Patient informed that his/her managed care company has contracts with or will negotiate with  certain facilities, including the following:     Patient/family informed of bed offers received:  08/23/2014 Patient chooses bed at [facility] and [place] recommends and patient chooses bed at    Patient to be transferred to  on  [facility] [place] on 08/24/2014 Patient to be transferred to facility by ambulance Corey Harold) Patient and family notified of transfer on 08/24/2014 Name of family member notified:  Pt daughter, [name] notified at  bedside. Pt daughter, [name] notified via telephone.  The following physician request were entered in Epic:   Additional Comments:   Alison Murray, MSW, [place] Work [phone]

## 2014-08-22 NOTE — Evaluation (Signed)
Physical Therapy Evaluation Patient Details Name: BEDA DULA MRN: [id number] DOB: November 24, 1929 Today's Date: 08/22/2014   History of Present Illness  80 yo female admitted with UTI, weakness. Hx of DM, HTN, TIA, Sz, osteopenia, dementia, chiari malformation, renal insufficiency, sacral ulcer  Clinical Impression  On eval, pt required Mod assist for mobility-able to perform stand pivot x2 with RW, bed<>bsc. Increased time and multimodal cues to complete tasks. Fatigues easily. Recommend SNF.     Follow Up Recommendations SNF    Equipment Recommendations  None recommended by PT    Recommendations for Other Services OT consult     Precautions / Restrictions Precautions Precautions: Fall Restrictions Weight Bearing Restrictions: No      Mobility  Bed Mobility Overal bed mobility: Needs Assistance Bed Mobility: Supine to Sit;Sit to Supine     Supine to sit: Mod assist;HOB elevated Sit to supine: Mod assist;HOB elevated   General bed mobility comments: Increased time. Assist for trunk and bil LEs. Utilized bedpad for scooting, positioning  Transfers Overall transfer level: Needs assistance Equipment used: Rolling walker (2 wheeled) Transfers: Sit to/from [facility] Sit to Stand: Mod assist Stand pivot transfers: Mod assist       General transfer comment: assist to rise, stabilize, control descent. Multimodal cues for safety, technique, hand placement.  Stand pivot x2, bed<bsc.   Ambulation/Gait                Stairs            Wheelchair Mobility    Modified Rankin (Stroke Patients Only)       Balance Overall balance assessment: Needs assistance         Standing balance support: Bilateral upper extremity supported;During functional activity Standing balance-Leahy Scale: Poor                               Pertinent Vitals/Pain Pain Assessment: 0-10    Home Living Family/patient expects to be discharged to::  Unsure Living Arrangements: Children Available Help at Discharge: Family;Available 24 hours/day Type of Home: House Home Access: Ramped entrance     Home Layout: Able to live on main level with bedroom/bathroom Home Equipment: Walker - 2 wheels;Bedside commode;Hospital bed;Wheelchair - manual;Grab bars - toilet      Prior Function Level of Independence: Needs assistance   Gait / Transfers Assistance Needed: Pt able to ambulate with supervision with RW  ADL's / Homemaking Assistance Needed: Pt able to perform toilet transfer and toileting with supervision, and was able to self feed.  Required assist for all other aspects of ADLs        Hand Dominance        Extremity/Trunk Assessment   Upper Extremity Assessment: Defer to OT evaluation           Lower Extremity Assessment: Generalized weakness      Cervical / Trunk Assessment: Kyphotic  Communication   Communication: HOH  Cognition Arousal/Alertness: Awake/alert Behavior During Therapy: WFL for tasks assessed/performed Overall Cognitive Status: History of cognitive impairments - at baseline                      General Comments      Exercises        Assessment/Plan    PT Assessment Patient needs continued PT services  PT Diagnosis Difficulty walking;Generalized weakness;Altered mental status   PT Problem List Decreased strength;Decreased activity tolerance;Decreased balance;Decreased mobility;Obesity;Decreased knowledge  of use of DME;Decreased cognition  PT Treatment Interventions DME instruction;Gait training;Functional mobility training;Therapeutic activities;Therapeutic exercise;Patient/family education;Balance training   PT Goals (Current goals can be found in the Care Plan section) Acute Rehab PT Goals Patient Stated Goal: none stated by pt Time For Goal Achievement: 09/05/14 Potential to Achieve Goals: Fair    Frequency Min 2X/week   Barriers to discharge        Co-evaluation                End of Session Equipment Utilized During Treatment: Gait belt Activity Tolerance: Patient limited by fatigue (limited by cognition) Patient left: in bed;with call bell/phone within reach;with family/visitor present           Time: 7425-9563 PT Time Calculation (min): 35 min   Charges:   PT Evaluation $Initial PT Evaluation Tier I: 1 Procedure PT Treatments $Therapeutic Activity: 23-37 mins   PT G Codes:          Weston Anna, MPT Pager: [phone]

## 2014-08-23 ENCOUNTER — Telehealth: Payer: Self-pay | Admitting: Internal Medicine

## 2014-08-23 DIAGNOSIS — I119 Hypertensive heart disease without heart failure: Secondary | ICD-10-CM

## 2014-08-23 NOTE — Telephone Encounter (Signed)
Pt is being transferred to [facility] after hospital D/C-- has appt with Dr Melvyn Novas 08/25/14 for Pulm Consult. Daughter states that the patient keeps getting recurrent UTI and this one got so bad that they want to send her to [facility] to recover- pt very weak and hallucinating Pt daughter [name], cancelled appt with MW for Friday Consult. I advised her that since she was scheduled with MW for [facility] Consult that she may be able to request an inpatient consult.  [name] is trying to get a Pulmonary Consult while patient is in the hospital --Will reschedule at later time if unable to get Consult while admitted.  Nothing further needed.

## 2014-08-23 NOTE — Progress Notes (Addendum)
Patient ID: Linda [name], female   DOB: [birth year], 80 y.o.   MRN: [id number] [facility] PROGRESS NOTE  Linda [name] IRJ:[id number] DOB: 1930-10-26 DOA: 08/21/2014 PCP: Leonides Sake, MD  Brief narrative: 80 y.o. female with a PMH of dementia, seizure disorder on Keppra, DM type II (diet controlled), frequent UTI who was admitted 08/21/14 with acute encephalopathy from recurrent UTI.   Assessment/Plan:   Principal Problem:  UTI (lower urinary tract infection) secondary to gram-negative rods / leukocytosis Continue empiric Rocephin. Follow up final urine culture results. Preliminary report shows gram-negative rods. May continue low rate IV fluids. Encourage by mouth intake. White blood cell count normalized. Active Problems:  Disorder of bone and cartilage  Hold Fosamax while in the hospital. Anxiety / dementia  Continue Aricept/Risperdal.  SW assisting with placement to skilled nursing facility. H/O Seizure  Continue Keppra 500 mg by mouth twice a day. Anemia of chronic disease  Likely secondary to history of chronic kidney disease.  Hemoglobin is 10.5. No current indication for transfusion. Acute on chronic renal failure  Baseline creatinine 0.94-1.1. Creatinine on 08/22/2014 is 1.18. Acute metabolic encephalopathy  Likely combination of dementia and urinary tract infection. Continue supportive care. Discharge to skilled nursing facility once stable. DM (diabetes mellitus) type II controlled with renal manifestation / Obesity  Not currently on treatment, diet liberalized. DVT prophylaxis  Lovenox ordered.   Code Status: DNR/DNI Family Communication: No family at the bedside. Disposition Plan: Likely SNF, when stable.   IV Access:   Peripheral IV Procedures and diagnostic studies:    Dg Chest 2 View 08/21/2014: Cardiomegaly without acute disease.  Medical Consultants:   None. Other Consultants:   None. Anti-Infectives:   Rocephin  08/21/14--->   Leisa Lenz, MD  [facility] Pager [phone]  If 7PM-7AM, please contact night-coverage [email or web address] Password [email or web address] 08/23/2014, 11:23 AM   LOS: 2 days    HPI/Subjective: No acute overnight events.  Objective: Filed Vitals:   08/22/14 0653 08/22/14 1330 08/22/14 2100 08/23/14 0658  BP: 133/68 108/57 145/69 160/62  Pulse: 70 73 67   Temp: 98.2 F (36.8 C) 97.8 F (36.6 C) 98.4 F (36.9 C)   TempSrc: Oral Oral Oral   Resp: 16 18 16    Height:      Weight:      SpO2: 95% 97% 93%     Intake/Output Summary (Last 24 hours) at 08/23/14 1123 Last data filed at 08/23/14 0600  Gross per 24 hour  Intake   1881 ml  Output    400 ml  Net   1481 ml    Exam:   General:  Pt is sleeping this am, no distress  Cardiovascular: Regular rate and rhythm, S1/S2 appreciated   Respiratory: Clear to auscultation bilaterally, no wheezing  Abdomen: Soft, non tender, non distended, bowel sounds present  Extremities: pulses DP and PT palpable bilaterally  Neuro: Grossly nonfocal  Data Reviewed: Basic Metabolic Panel:  Recent Labs Lab 08/21/14 1500 08/22/14 0928  NA 142 143  K 5.0 4.4  CL 104 104  CO2 28 26  GLUCOSE 117* 137*  BUN 29* 24*  CREATININE 1.61* 1.18*  CALCIUM 9.6 9.1   Liver Function Tests:  Recent Labs Lab 08/21/14 1500  AST 13  ALT 9  ALKPHOS 45  BILITOT 0.3  PROT 6.4  ALBUMIN 3.4*   No results found for this basename: LIPASE, AMYLASE,  in the last 168 hours No results found for this basename: AMMONIA,  in the last 168 hours  CBC:  Recent Labs Lab 08/21/14 1500 08/22/14 0928  WBC 10.9* 6.6  NEUTROABS 8.5*  --   HGB 11.1* 10.5*  HCT 33.8* 32.7*  MCV 91.6 91.9  PLT 227 209   Cardiac Enzymes:  Recent Labs Lab 08/21/14 1639  TROPONINI <0.30   BNP: No components found with this basename: POCBNP,  CBG:  Recent Labs Lab 08/21/14 1419 08/22/14 0755 08/22/14 1141  GLUCAP 120* 116* 105*    URINE CULTURE      Status: None   Collection Time    08/21/14  6:15 PM      Result Value Ref Range Status   Specimen Description URINE, CATHETERIZED   Final   Value: GRAM NEGATIVE RODS     Performed at [facility]   Report Status PENDING   Incomplete     Scheduled Meds: . cefTRIAXone (ROCEPHIN)  IV  1 g Intravenous Q24H  . donepezil  20 mg Oral QHS  . enoxaparin (LOVENOX) injection  40 mg Subcutaneous Q24H  . escitalopram  20 mg Oral Daily  . feeding supplement (GLUCERNA SHAKE)  237 mL Oral TID BM  . levETIRAcetam  500 mg Oral BID  . mirabegron ER  25 mg Oral Daily  . nystatin  1 g Topical Daily  . risperiDONE  1 mg Oral BID  . traZODone  100 mg Oral QHS   Continuous Infusions: . sodium chloride 50 mL/hr at 08/23/14 0600

## 2014-08-23 NOTE — Progress Notes (Signed)
CSW continuing to follow for disposition planning.  CSW received phone call from pt daughter, Tawanna Sat this morning stating that she went to Department of Social Services and application is submitted for pt Medicaid. Pt daughter request for CSW to fax pt FL2 to [facility] caseworker to assist with application process for long term care Medicaid. Pt daughter provided permission to fax FL2 and CSW faxed FL2 to DSS caseworker, Ronalee Red.   CSW discussed with pt daughter, [name] that [facility] pt a bed. Pt daughter relieved as the facility is near pt family. Pt daughter expressed her gratefulness for staff at the hospital and CSW provided support as pt daughter discussed that pt is usually agitated and restless in the hospital, but has been very calm this hospitalization and pt has expressed to her daughter that she is being well taken care of in the hospital.   CSW notified [facility] of pt family acceptance of bed offer. [place] confirmed that pt will have bed available when medically stable for discharge.  Per MD, pt not yet medically ready for discharge.  CSW to continue to follow to provide support and assist with pt discharge planning needs to [facility] when medically stable.  Alison Murray, MSW, [place] Work [phone]

## 2014-08-23 NOTE — Telephone Encounter (Signed)
(  continued from above)  States that pt is having difficulty breathing, so would just like some advise.  Satira Anis

## 2014-08-24 LAB — URINE CULTURE
Colony Count: >=100000
Special Requests: NORMAL

## 2014-08-24 LAB — GLUCOSE, CAPILLARY: Glucose-Capillary: 94 mg/dL (ref 70–99)

## 2014-08-24 MED ORDER — CIPROFLOXACIN HCL 500 MG PO TABS: 500.0000 mg | ORAL_TABLET | Freq: Two times a day (BID) | ORAL | Status: DC

## 2014-08-24 MED ORDER — CIPROFLOXACIN HCL 500 MG PO TABS
500.0000 mg | ORAL_TABLET | Freq: Two times a day (BID) | ORAL | Status: DC
  Filled 2014-08-24 (×2): qty 1

## 2014-08-24 MED ORDER — ESCITALOPRAM OXALATE 20 MG PO TABS: 20.0000 mg | ORAL_TABLET | Freq: Every day | ORAL | Status: DC

## 2014-08-24 MED ORDER — ALUM & MAG HYDROXIDE-SIMETH 200-200-20 MG/5ML PO SUSP: 30.0000 mL | Freq: Four times a day (QID) | ORAL | Status: DC | PRN

## 2014-08-24 MED ORDER — OSMOLITE 1.2 CAL PO LIQD: 237.0000 mL | ORAL | Status: DC

## 2014-08-24 MED ORDER — DEXTROSE 5 % IV SOLN
2.0000 g | Freq: Once | INTRAVENOUS | Status: AC
  Administered 2014-08-24: 2 g via INTRAVENOUS
  Filled 2014-08-24: qty 2

## 2014-08-24 MED ORDER — TRAZODONE HCL 100 MG PO TABS: 100.0000 mg | ORAL_TABLET | Freq: Every day | ORAL | Status: DC

## 2014-08-24 MED ORDER — RISPERIDONE 0.5 MG PO TABS: 1.0000 mg | ORAL_TABLET | Freq: Two times a day (BID) | ORAL | Status: DC

## 2014-08-24 MED ORDER — OXYCODONE-ACETAMINOPHEN 10-325 MG PO TABS: 1.0000 | ORAL_TABLET | ORAL | Status: DC | PRN

## 2014-08-24 MED ORDER — CIPROFLOXACIN HCL 250 MG PO TABS
250.0000 mg | ORAL_TABLET | Freq: Two times a day (BID) | ORAL | Status: DC
  Filled 2014-08-24 (×2): qty 1

## 2014-08-24 NOTE — Progress Notes (Addendum)
ANTIBIOTIC CONSULT NOTE - INITIAL  Pharmacy Consult for pseudomonas antibiotics Indication: Pseudomonas UTI  Allergies  Allergen Reactions  . Amoxicillin Other (See Comments)    REACTION: aching all over, has tolerated Zosyn  . Atorvastatin Other (See Comments)    REACTION: increased CPK  . Cephalexin Nausea Only and Other (See Comments)    Weak, Headache, has tolerated ceftriaxone  . Cetirizine Hcl Other (See Comments)    REACTION: makes her head feel big  . Cortisone Other (See Comments)    REACTION: increased bp  . Ezetimibe-Simvastatin Other (See Comments)    unknown  . Fentanyl Other (See Comments)    REACTION: stinging in her skin  . Guaifenesin Other (See Comments)    unknown  . Ketorolac Tromethamine     REACTION: no work  . Lisinopril Cough    ACE related  . Metformin Other (See Comments)    epistaxis  . Nsaids Other (See Comments)    epistaxis  . Penicillins Nausea Only and Other (See Comments)    Headache, has tolerated Zosyn  . Rofecoxib Itching  . Rosuvastatin Other (See Comments)    Myalgias  . Simvastatin Other (See Comments)    unknown  . Spironolactone Nausea Only  . Sulfamethoxazole-Tmp Ds Other (See Comments)    unknown  . Codeine Rash  . Tape Rash    Patient Measurements: Height: 5\' 5"  (165.1 cm) Weight: 216 lb (97.977 kg) IBW/kg (Calculated) : 57 Adjusted Body Weight: 73.5kg  Assessment: 11 yoF admitted with generalized weakness and confusion x 3 days PTA. Per ED provider note, pt with recent UTI, treated with cephalexin and ciprofloxacin. Pt noted to have recent admission 8/31-9/2 for seizure-like activity. Patient also treated during that admission for aspiration pneumonia with Zosyn. Noted urine culture drawn 8/31 shows pseudomonas UTI (covered by Zosyn), but the culture did not result until after discharge so patient was not sent home at that time on PO antibiotics (from what I can tell per the discharge summary written 9/2).   Patient  was started on ceftriaxone per MD on admission for UTI. Today is D3 ceftriaxone.   Urine culture resulting this AM shows Pseudomonas UTI and Pharmacy has been consulted to suggest PO antibiotics since patient will be discharged later this afternoon.  Antiinfectives  10/12 >> ceftriaxone >> 10/15 10/15 >> cefepime x1 10/15 >> ciprofloxacin PO    Labs / vitals Tmax: afebrile WBCs: improved to WNL Renal: SCr improved to 1.18 (appears close to patient's baseline), CrCl ~40 ml/min GC/N  Microbiology 8/31 urine: > 100k Pseudomonas (pansensitive) 10/12 urine: > 100k Pseudomonas (pansensitive)  10/15: D1 cefepime 2g x1 toget some appropriate treatment on board / D1 ciprofloxacin 500mg  PO BID for Pseudomonas UTI   Goal of Therapy:  cefepime and ciprofloxacin per indication and renal function  Plan:  - cefepime 2g IV x1 - ciprofloxacin 250mg  PO BID, to start tonight - follow-up clinical course, renal function - follow-up antibiotic length of therapy  Thank you for the consult.  Currie Paris, PharmD, BCPS Pager: [phone] Pharmacy: [phone] 08/24/2014 10:35 AM

## 2014-08-24 NOTE — Discharge Summary (Addendum)
Physician Discharge Summary  Linda [name]:[id number] DOB: Sep 17, 1930 DOA: 08/21/2014  PCP: Leonides Sake, MD  Admit date: 08/21/2014 Discharge date: 08/24/2014  Recommendations for Outpatient Follow-up:  1. Continue treatment for Pseudomonas UTI with ciprofloxacin 500 mg twice daily for 7 days. 2. Recheck BMP in next 4-5 days. Creatinine is 1.18 prior to discharge.  Discharge Diagnoses:  Principal Problem:   UTI (lower urinary tract infection) Active Problems:   Obesity   Disorder of bone and cartilage   Anxiety   Dementia   Acute on chronic renal failure   Acute encephalopathy   DM (diabetes mellitus) type II controlled with renal manifestation   Seizure disorder    Discharge Condition: stable; and I spoke with the patient's daughter over the phone Linda [name] who had concerns about recurrent urinary tract infection. I told her that I spoke with Dr. Lucianne Lei dam of infectious disease who recommended Cipro for 7 days and not having suppressive therapy. This was communicated to the patient's daughter who wasn't really happy with the answer and said " okay we will do that but we may be seeing you again in hospital". I explained to the patient's daughter that patient is medically stable to be discharged, tolerates by mouth intake well, does not have nausea or vomiting. She is able to be discharged with ciprofloxacin for 7 days for Pseudomonas UTI.  Diet recommendation: as tolerated   History of present illness:  80 y.o. female with a PMH of dementia, seizure disorder on Keppra, DM type II (diet controlled), frequent UTI who was admitted 08/21/14 with acute encephalopathy from recurrent UTI.   Assessment/Plan:   Principal Problem:  UTI (lower urinary tract infection) secondary to Pseudomonas / leukocytosis  Patient has received Rocephin on admission. Urine cultures growing Pseudomonas. Patient will receive one dose of cefepime prior to discharge. She will then continue ciprofloxacin for  7 days on discharge. White blood cell count normalized. Active Problems:  Disorder of bone and cartilage  May resume Fosamax on discharge. Anxiety / dementia  Continue Aricept/Risperdal.  SW assisting with placement to skilled nursing facility. H/O Seizure  Continue Keppra 500 mg by mouth twice a day. Anemia of chronic disease   Likely secondary to history of chronic kidney disease.   Hemoglobin is 10.5. No current indication for transfusion. Acute on chronic renal failure  Baseline creatinine 0.94-1.1. Creatinine on 08/22/2014 is 1.18. Acute metabolic encephalopathy  Likely combination of dementia and urinary tract infection. Continue supportive care. Discharge to skilled nursing facility today. DM (diabetes mellitus) type II controlled with renal manifestation / Obesity  Not currently on treatment, diet liberalized. DVT prophylaxis  Lovenox ordered while patient is in hospital. Code Status: DNR/DNI  Family Communication: No family at the bedside.    IV Access:   Peripheral IV  Procedures and diagnostic studies:    Dg Chest 2 View 08/21/2014: Cardiomegaly without acute disease.   Medical Consultants:   None.  Other Consultants:   None.  Anti-Infectives:   Rocephin 08/21/14--->08/24/2014 Cefepime 08/24/2014 one dose prior to discharge.  Cipro 08/24/2014 for 7 days on discharge.    Signed:  Leisa Lenz, MD  [facility] 08/24/2014, 10:22 AM  Pager #: [phone]  Discharge Exam: Filed Vitals:   08/23/14 2152  BP: 151/64  [name]: 74  Temp: 98.6 F (37 C)  Resp: 16   Filed Vitals:   08/22/14 2100 08/23/14 0658 08/23/14 1427 08/23/14 2152  BP: 145/69 160/62 160/60 151/64  [name]: 67 58 60 74  Temp: 98.4  F (36.9 C) 98.4 F (36.9 C) 98.6 F (37 C) 98.6 F (37 C)  TempSrc: Oral Oral Oral Oral  Resp: 16 16 18 16   Height:      Weight:  97.977 kg (216 lb)    SpO2: 93% 97% 97% 98%    General: Pt is alert, no distress Cardiovascular: Regular  rate and rhythm, S1/S2 appreciated  Respiratory: Clear to auscultation bilaterally, no wheezing, no crackles, no rhonchi Abdominal: Soft, non tender, non distended, bowel sounds +, no guarding Extremities: no cyanosis, pulses palpable bilaterally DP and PT Neuro: Grossly nonfocal  Discharge Instructions  Discharge Instructions   Call MD for:  difficulty breathing, headache or visual disturbances    Complete by:  As directed      Call MD for:  persistant dizziness or light-headedness    Complete by:  As directed      Call MD for:  persistant nausea and vomiting    Complete by:  As directed      Call MD for:  redness, tenderness, or signs of infection (pain, swelling, redness, odor or green/yellow discharge around incision site)    Complete by:  As directed      Diet - low sodium heart healthy    Complete by:  As directed      Discharge instructions    Complete by:  As directed   1. Continue treatment for Pseudomonas UTI with ciprofloxacin 500 mg twice daily for 7 days. 2. Recheck BMP in next 4-5 days. Creatinine is 1.18 prior to discharge.     Increase activity slowly    Complete by:  As directed             Medication List         acetaminophen 325 MG tablet  Commonly known as:  TYLENOL  Take 650 mg by mouth every 6 (six) hours as needed for mild pain or moderate pain (leg pain).     alendronate 70 MG tablet  Commonly known as:  FOSAMAX  Take 70 mg by mouth every 7 (seven) days. Saturday. Take with a full glass of water on an empty stomach.     alum & mag hydroxide-simeth 200-200-20 MG/5ML suspension  Commonly known as:  MAALOX/MYLANTA  Take 30 mLs by mouth every 6 (six) hours as needed for indigestion or heartburn (dyspepsia).     ciprofloxacin 500 MG tablet  Commonly known as:  CIPRO  Take 1 tablet (500 mg total) by mouth 2 (two) times daily.     donepezil 10 MG tablet  Commonly known as:  ARICEPT  Take 20 mg by mouth at bedtime.     escitalopram 20 MG tablet   Commonly known as:  LEXAPRO  Take 1 tablet (20 mg total) by mouth daily. For nerves     feeding supplement (GLUCERNA SHAKE) Liqd  Take 237 mLs by mouth 3 (three) times daily between meals.     levETIRAcetam 500 MG tablet  Commonly known as:  KEPPRA  Take 1 tablet (500 mg total) by mouth 2 (two) times daily.     MYRBETRIQ 25 MG Tb24 tablet  Generic drug:  mirabegron ER  Take 25 mg by mouth daily.     nystatin powder  Commonly known as:  MYCOSTATIN  Apply 1 g topically daily. Apply under breasts and under stomach fold.     ondansetron 4 MG tablet  Commonly known as:  ZOFRAN  Take 4 mg by mouth every 8 (eight) hours as needed for  nausea or vomiting (nausea).     oxyCODONE-acetaminophen 10-325 MG per tablet  Commonly known as:  PERCOCET  Take 1 tablet by mouth every 4 (four) hours as needed for pain (pain).     risperiDONE 0.5 MG tablet  Commonly known as:  RISPERDAL  Take 2 tablets (1 mg total) by mouth 2 (two) times daily.     senna 8.6 MG Tabs tablet  Commonly known as:  SENOKOT  Take 1 tablet by mouth daily as needed for mild constipation (constipation).     traZODone 100 MG tablet  Commonly known as:  DESYREL  Take 1 tablet (100 mg total) by mouth at bedtime.           Follow-up Information   Follow up with Edward Plainfield L, MD. Schedule an appointment as soon as possible for a visit in 2 weeks. (Follow up appt after recent hospitalization)    Specialty:  Family Medicine   Contact information:   Dr. Daiva Eves [address] [place] [address] [phone]        The results of significant diagnostics from this hospitalization (including imaging, microbiology, ancillary and laboratory) are listed below for reference.    Significant Diagnostic Studies: Dg Chest 2 View  08/21/2014   CLINICAL DATA:  Weakness and decreased oxygen saturation for 3 days.  EXAM: CHEST  2 VIEW  COMPARISON:  PA and lateral chest 07/10/2014 and 06/11/2013.  FINDINGS:  There is cardiomegaly without edema. The lungs are clear. No pneumothorax or pleural effusion. Postoperative change of right shoulder replacement and cervical fusion noted.  IMPRESSION: Cardiomegaly without acute disease.   Electronically Signed   By: Inge Rise M.D.   On: 08/21/2014 16:29    Microbiology: Recent Results (from the past 240 hour(s))  URINE CULTURE     Status: None   Collection Time    08/21/14  6:15 PM      Result Value Ref Range Status   Specimen Description URINE, CATHETERIZED   Final   Special Requests Normal   Final   Culture  Setup Time     Final   Value: 08/21/2014 23:20     Performed at [facility]     Final   Value: >=100,000 COLONIES/ML     Performed at [facility]   Culture     Final   Value: PSEUDOMONAS AERUGINOSA     Performed at [facility]   Report Status 08/24/2014 FINAL   Final   Organism ID, Bacteria PSEUDOMONAS AERUGINOSA   Final     Labs: Basic Metabolic Panel:  Recent Labs Lab 08/21/14 1500 08/22/14 0928  NA 142 143  K 5.0 4.4  CL 104 104  CO2 28 26  GLUCOSE 117* 137*  BUN 29* 24*  CREATININE 1.61* 1.18*  CALCIUM 9.6 9.1   Liver Function Tests:  Recent Labs Lab 08/21/14 1500  AST 13  ALT 9  ALKPHOS 45  BILITOT 0.3  PROT 6.4  ALBUMIN 3.4*   No results found for this basename: LIPASE, AMYLASE,  in the last 168 hours No results found for this basename: AMMONIA,  in the last 168 hours CBC:  Recent Labs Lab 08/21/14 1500 08/22/14 0928  WBC 10.9* 6.6  NEUTROABS 8.5*  --   HGB 11.1* 10.5*  HCT 33.8* 32.7*  MCV 91.6 91.9  PLT 227 209   Cardiac Enzymes:  Recent Labs Lab 08/21/14 1639  TROPONINI <0.30   BNP: BNP (last 3  results)  Recent Labs  04/10/14 1855 08/21/14 1640  PROBNP 239.0 90.9   CBG:  Recent Labs Lab 08/21/14 1419 08/22/14 0755 08/22/14 1141 08/24/14 0802  GLUCAP 120* 116* 105* 94    Time coordinating discharge: Over 30 minutes

## 2014-08-24 NOTE — Discharge Instructions (Signed)

## 2014-08-24 NOTE — Progress Notes (Signed)
Linda [name] was discharged to [facility] at 1330 today. She was alert, but easily drowsy due to pain meds given one hour ago. Report called, questions answered. Pastient calm, in good spirits. Daughter accompanying,

## 2014-08-24 NOTE — Progress Notes (Signed)
Pt for discharge to [facility].  CSW facilitated pt discharge needs including contacting facility, faxing pt discharge information via [facility], discussing with pt daughter, [name] at bedside and pt daughter, [name] via telephone, providing RN phone number to call report, and arranging ambulance transport via [place] scheduled for 1 pm.   Pt daughters expressed concern about pt discharging today as they feel that pt should remain in the hospital until UTI is clear. MD has already spoken with pt daughters surrounding these concerns. CSW provided support and discussed that Isaias Cowman will have a doctor following to ensure that pt medicine working effectively for UTI.   No further social work needs identified at this time.  CSW signing off.   Alison Murray, MSW, [place] Work [phone]

## 2014-08-25 ENCOUNTER — Institutional Professional Consult (permissible substitution): Payer: PRIVATE HEALTH INSURANCE | Admitting: Internal Medicine

## 2014-08-25 ENCOUNTER — Non-Acute Institutional Stay (SKILLED_NURSING_FACILITY): Payer: Medicare Other | Admitting: Adult Health

## 2014-08-25 ENCOUNTER — Encounter: Payer: Self-pay | Admitting: Adult Health

## 2014-08-25 DIAGNOSIS — G40909 Epilepsy, unspecified, not intractable, without status epilepticus: Secondary | ICD-10-CM

## 2014-08-25 DIAGNOSIS — M5030 Other cervical disc degeneration, unspecified cervical region: Secondary | ICD-10-CM

## 2014-08-25 DIAGNOSIS — R32 Unspecified urinary incontinence: Secondary | ICD-10-CM

## 2014-08-25 DIAGNOSIS — N390 Urinary tract infection, site not specified: Secondary | ICD-10-CM

## 2014-08-25 DIAGNOSIS — M949 Disorder of cartilage, unspecified: Secondary | ICD-10-CM

## 2014-08-25 DIAGNOSIS — K5900 Constipation, unspecified: Secondary | ICD-10-CM

## 2014-08-25 DIAGNOSIS — M899 Disorder of bone, unspecified: Secondary | ICD-10-CM

## 2014-08-25 DIAGNOSIS — F419 Anxiety disorder, unspecified: Secondary | ICD-10-CM

## 2014-08-25 DIAGNOSIS — F0390 Unspecified dementia without behavioral disturbance: Secondary | ICD-10-CM

## 2014-08-25 MED ORDER — CRANBERRY 250 MG PO TABS: 250.0000 mg | ORAL_TABLET | Freq: Every day | ORAL | Status: DC

## 2014-08-25 MED ORDER — RISPERIDONE 0.5 MG PO TABS: 0.5000 mg | ORAL_TABLET | Freq: Two times a day (BID) | ORAL | Status: DC

## 2014-08-25 NOTE — Progress Notes (Signed)
Patient ID: Linda [name], female   DOB: 06-12-1930, 80 y.o.   MRN: [id number]     [facility]  Allergies  Allergen Reactions  . Atorvastatin Other (See Comments)    REACTION: increased CPK  . Cephalexin Nausea Only and Other (See Comments)    Weak, Headache, has tolerated ceftriaxone  . Cetirizine Hcl Other (See Comments)    REACTION: makes her head feel big  . Cortisone Other (See Comments)    REACTION: increased bp  . Ezetimibe-Simvastatin Other (See Comments)    unknown  . Fentanyl Other (See Comments)    REACTION: stinging in her skin  . Guaifenesin Other (See Comments)    unknown  . Ketorolac Tromethamine     REACTION: no work  . Lisinopril Cough    ACE related  . Metformin Other (See Comments)    epistaxis  . Nsaids Other (See Comments)    epistaxis  . Penicillins Nausea Only and Other (See Comments)    Headache, has tolerated Zosyn  . Rofecoxib Itching  . Rosuvastatin Other (See Comments)    Myalgias  . Simvastatin Other (See Comments)    unknown  . Spironolactone Nausea Only  . Sulfamethoxazole-Tmp Ds Other (See Comments)    unknown  . Codeine Rash  . Tape Rash     Chief Complaint  Patient presents with  . Hospitalization Follow-up    HPI:  She is a patient recurrent uti's hospitalized with a pseudomonas aeruginosa uti and acute encephalopathy. She has dementia and is unable to participate in the hpi or ros. She does have sitter at the bedside. I/D was consulted due to family concern about her frequent uti who did not recommend suppressive therapy. She is here for short term rehab and more than likely long term placement.    Past Medical History  Diagnosis Date  . Allergy     allergic rhinitis  . Diabetes mellitus     type II  . Hypertension   . Osteopenia   . TIA (transient ischemic attack)   . Contact dermatitis     on buttocks  . Nosebleed   . Anxiety   . Renal insufficiency   . Pyelonephritis   . Dementia   . Chiari malformation   .  Seizure disorder   . ROTATOR CUFF TEAR 10/13/2007    Qualifier: Diagnosis of  By: Marcelino Scot CMA, [place]      Past Surgical History  Procedure Laterality Date  . Rotator cuff repair  06/2007    right  . Abdominal hysterectomy    . Back surgery  03/1999    disk  . Breast surgery      breast biopsy x2 negative  . Shoulder surgery  2007    left  . Joint replacement      Rt knee patella replacement  . Colonoscopy  08/28/2012    Procedure: COLONOSCOPY;  Surgeon: Beryle Beams, MD;  Location: [facility] ENDOSCOPY;  Service: Endoscopy;  Laterality: N/A;    VITAL SIGNS BP 125/83  Pulse 78  Ht 5\' 5"  (1.651 m)  Wt 218 lb (98.884 kg)  BMI 36.28 kg/m2  SpO2 96%   Patient's Medications  New Prescriptions   No medications on file  Previous Medications   ACETAMINOPHEN (TYLENOL) 325 MG TABLET    Take 650 mg by mouth every 6 (six) hours as needed for mild pain or moderate pain (leg pain).   ALENDRONATE (FOSAMAX) 70 MG TABLET    Take 70 mg by mouth  every 7 (seven) days. Saturday. Take with a full glass of water on an empty stomach.   ALUM & MAG HYDROXIDE-SIMETH (MAALOX/MYLANTA) 200-200-20 MG/5ML SUSPENSION    Take 30 mLs by mouth every 6 (six) hours as needed for indigestion or heartburn (dyspepsia).   CIPROFLOXACIN (CIPRO) 500 MG TABLET    Take 1 tablet (500 mg total) by mouth 2 (two) times daily.   DONEPEZIL (ARICEPT) 10 MG TABLET    Take 20 mg by mouth at bedtime.    ESCITALOPRAM (LEXAPRO) 20 MG TABLET    Take 1 tablet (20 mg total) by mouth daily. For nerves   FEEDING SUPPLEMENT, GLUCERNA SHAKE, (GLUCERNA SHAKE) LIQD    Take 237 mLs by mouth 3 (three) times daily between meals.   LEVETIRACETAM (KEPPRA) 500 MG TABLET    Take 1 tablet (500 mg total) by mouth 2 (two) times daily.   MIRABEGRON ER (MYRBETRIQ) 25 MG TB24 TABLET    Take 25 mg by mouth daily.   NYSTATIN (MYCOSTATIN) POWDER    Apply 1 g topically daily. Apply under breasts and under stomach fold.   ONDANSETRON (ZOFRAN) 4 MG TABLET     Take 4 mg by mouth every 8 (eight) hours as needed for nausea or vomiting (nausea).    OXYCODONE-ACETAMINOPHEN (PERCOCET) 10-325 MG PER TABLET    Take 1 tablet by mouth every 4 (four) hours as needed for pain (pain).   RISPERIDONE (RISPERDAL) 0.5 MG TABLET    Take 2 tablets (1 mg total) by mouth 2 (two) times daily.   SENNA (SENOKOT) 8.6 MG TABS TABLET    Take 1 tablet by mouth daily as needed for mild constipation (constipation).    TRAZODONE (DESYREL) 100 MG TABLET    Take 1 tablet (100 mg total) by mouth at bedtime.  Modified Medications   No medications on file  Discontinued Medications   No medications on file    SIGNIFICANT DIAGNOSTIC EXAMS  08-21-14: chest x-ray: Cardiomegaly without acute disease.   LABS REVIEWED:  08-21-14: wbc 10.9; hgb 11.1; hct 33.8; mcv 91.6; plt 227; glucose 117; bun 29; creat 1.61; k+5.0; na++142; liver normal albumin 3.4 BNP 90.9; urine culture: pseudomonas aeruginosa  08-22-14: wbc 6.6; hgb 10.5; hct 32.7; mcv 91.9; plt 209; glucose 137; bun 24; creat 1.18; k+4.4; na++143     Review of Systems  Unable to perform ROS    Physical Exam  Constitutional: She appears well-developed and well-nourished. No distress.  Overweight  Neck: Neck supple. No JVD present. No thyromegaly present.  Cardiovascular: Normal rate, regular rhythm and intact distal pulses.   Respiratory: Effort normal and breath sounds normal.  GI: Soft. Bowel sounds are normal. She exhibits no distension.  Musculoskeletal: She exhibits no edema.  Is able to move extremitites   Neurological:  Lethargic today   Skin: Skin is warm and dry. She is not diaphoretic.  Left buttock: 7 x 7 cm bruised area which is blanchable Right buttock: 12 x 11 cm bruised area which is blanchable with an open area of 3 x 3 x 0.2 cm with pink wound bed no signs of infection present.      ASSESSMENT/ PLAN:  1. UTI: will have her complete her coarse of cipro will begin cranberry supplement daily; and  will monitor her status.   2. UI: she is on long term myrbetriq 25 mg daily   3. Dementia without behavioral issues: without change in her status; will continue aricept 20 mg daily and will monitor her status.  4. Osteoporosis: will continue fosamax 70 mg weekly and will monitor   5. Depression/anxiety; will continue her lexapro 20 mg daily trrazodone 100 mg nightly to help with sleep and will lower her risperdal to 0.5 mg twice daily as she is lethargic today and will monitor her status.   6. Seizure: no reports of seizure activity present will continue keppra 500 mg twice daily  7. Constipation: will continue senna daily prn  8. DDD of cervical spine: is presently stable has percocet 10/325 mg every 4 hours as needed for pain and will monitor    Will check bmp next week   Time spent with patient 50 minutes.    Ok Edwards NP [facility]  Contact [phone] Monday through Friday 8am- 5pm  After hours call [phone]

## 2014-08-28 ENCOUNTER — Non-Acute Institutional Stay (SKILLED_NURSING_FACILITY): Payer: Medicare Other | Admitting: Adult Health

## 2014-08-28 DIAGNOSIS — M5030 Other cervical disc degeneration, unspecified cervical region: Secondary | ICD-10-CM

## 2014-08-28 DIAGNOSIS — F418 Other specified anxiety disorders: Secondary | ICD-10-CM

## 2014-08-29 ENCOUNTER — Non-Acute Institutional Stay (SKILLED_NURSING_FACILITY): Payer: Medicare Other | Admitting: Adult Health

## 2014-08-29 ENCOUNTER — Other Ambulatory Visit: Payer: Self-pay | Admitting: *Deleted

## 2014-08-29 DIAGNOSIS — N189 Chronic kidney disease, unspecified: Secondary | ICD-10-CM

## 2014-08-29 DIAGNOSIS — F0390 Unspecified dementia without behavioral disturbance: Secondary | ICD-10-CM

## 2014-08-29 DIAGNOSIS — N179 Acute kidney failure, unspecified: Secondary | ICD-10-CM

## 2014-08-29 DIAGNOSIS — F418 Other specified anxiety disorders: Secondary | ICD-10-CM

## 2014-08-29 MED ORDER — OXYCODONE-ACETAMINOPHEN 10-325 MG PO TABS: ORAL_TABLET | ORAL | Status: DC

## 2014-08-29 NOTE — Telephone Encounter (Signed)
[facility] 

## 2014-08-30 ENCOUNTER — Encounter: Payer: Self-pay | Admitting: Adult Health

## 2014-08-30 DIAGNOSIS — F418 Other specified anxiety disorders: Secondary | ICD-10-CM | POA: Insufficient documentation

## 2014-08-30 MED ORDER — TRAZODONE HCL 100 MG PO TABS: 50.0000 mg | ORAL_TABLET | Freq: Every day | ORAL | Status: DC

## 2014-08-30 MED ORDER — RISPERIDONE 0.5 MG PO TABS: 0.5000 mg | ORAL_TABLET | Freq: Two times a day (BID) | ORAL | Status: DC

## 2014-08-30 MED ORDER — ACETAMINOPHEN 325 MG PO TABS: 650.0000 mg | ORAL_TABLET | Freq: Every day | ORAL | Status: DC

## 2014-08-30 NOTE — Progress Notes (Signed)
Patient ID: Linda SALMONS, female   DOB: [birth year], 80 y.o.   MRN: [id number]     [facility]  Allergies  Allergen Reactions  . Atorvastatin Other (See Comments)    REACTION: increased CPK  . Cephalexin Nausea Only and Other (See Comments)    Weak, Headache, has tolerated ceftriaxone  . Cetirizine Hcl Other (See Comments)    REACTION: makes her head feel big  . Cortisone Other (See Comments)    REACTION: increased bp  . Ezetimibe-Simvastatin Other (See Comments)    unknown  . Fentanyl Other (See Comments)    REACTION: stinging in her skin  . Guaifenesin Other (See Comments)    unknown  . Ketorolac Tromethamine     REACTION: no work  . Lisinopril Cough    ACE related  . Metformin Other (See Comments)    epistaxis  . Nsaids Other (See Comments)    epistaxis  . Penicillins Nausea Only and Other (See Comments)    Headache, has tolerated Zosyn  . Rofecoxib Itching  . Rosuvastatin Other (See Comments)    Myalgias  . Simvastatin Other (See Comments)    unknown  . Spironolactone Nausea Only  . Sulfamethoxazole-Tmp Ds Other (See Comments)    unknown  . Codeine Rash  . Tape Rash     Chief Complaint  Patient presents with  . Acute Visit    follow up labs     HPI:  Her bmp demonstrates a worsening renal function. She remains lethargic and is unable to eat and drink adequate intake. The concern is that she could become dehydrated. It took a great deal of prodding in order to get her awake in order to try to participate in therapy today. I have spoken with her family regarding her medication regimen. They have agreed that her medications could be contributing to her lethargy.   Past Medical History  Diagnosis Date  . Allergy     allergic rhinitis  . Diabetes mellitus     type II  . Hypertension   . Osteopenia   . TIA (transient ischemic attack)   . Contact dermatitis     on buttocks  . Nosebleed   . Anxiety   . Renal insufficiency   . Pyelonephritis   .  Dementia   . Chiari malformation   . Seizure disorder   . ROTATOR CUFF TEAR 10/13/2007    Qualifier: Diagnosis of  By: Marcelino Scot CMA, [place] Bilberry      Past Surgical History  Procedure Laterality Date  . Rotator cuff repair  06/2007    right  . Abdominal hysterectomy    . Back surgery  03/1999    disk  . Breast surgery      breast biopsy x2 negative  . Shoulder surgery  2007    left  . Joint replacement      Rt knee patella replacement  . Colonoscopy  08/28/2012    Procedure: COLONOSCOPY;  Surgeon: Beryle Beams, MD;  Location: [facility] ENDOSCOPY;  Service: Endoscopy;  Laterality: N/A;    VITAL SIGNS BP 132/68  Pulse 78  Ht 5\' 5"  (1.651 m)  Wt 218 lb (98.884 kg)  BMI 36.28 kg/m2   Patient's Medications  New Prescriptions   No medications on file  Previous Medications   ACETAMINOPHEN (TYLENOL) 325 MG TABLET    Take 2 tablets (650 mg total) by mouth daily.   ALENDRONATE (FOSAMAX) 70 MG TABLET    Take 70 mg by mouth every 7 (  seven) days. Saturday. Take with a full glass of water on an empty stomach.   ALUM & MAG HYDROXIDE-SIMETH (MAALOX/MYLANTA) 200-200-20 MG/5ML SUSPENSION    Take 30 mLs by mouth every 6 (six) hours as needed for indigestion or heartburn (dyspepsia).   CIPROFLOXACIN (CIPRO) 500 MG TABLET    Take 1 tablet (500 mg total) by mouth 2 (two) times daily.   CRANBERRY 250 MG TABS    Take 1 tablet (250 mg total) by mouth daily.   DONEPEZIL (ARICEPT) 10 MG TABLET    Take 20 mg by mouth at bedtime.    ESCITALOPRAM (LEXAPRO) 20 MG TABLET    Take 1 tablet (20 mg total) by mouth daily. For nerves   FEEDING SUPPLEMENT, GLUCERNA SHAKE, (GLUCERNA SHAKE) LIQD    Take 237 mLs by mouth 3 (three) times daily between meals.   LEVETIRACETAM (KEPPRA) 500 MG TABLET    Take 1 tablet (500 mg total) by mouth 2 (two) times daily.   MIRABEGRON ER (MYRBETRIQ) 25 MG TB24 TABLET    Take 25 mg by mouth daily.   NYSTATIN (MYCOSTATIN) POWDER    Apply 1 g topically daily. Apply under breasts and  under stomach fold.   ONDANSETRON (ZOFRAN) 4 MG TABLET    Take 4 mg by mouth every 8 (eight) hours as needed for nausea or vomiting (nausea).    OXYCODONE-ACETAMINOPHEN (PERCOCET) 10-325 MG PER TABLET    Take one tablet by mouth every 4 hours as needed for neck,back,shoulder and knee pain; Take one tablet by mouth every night at bedtime   SENNA (SENOKOT) 8.6 MG TABS TABLET    Take 1 tablet by mouth daily as needed for mild constipation (constipation).    TRAZODONE (DESYREL) 100 MG TABLET    Take 1 tablet (100 mg total) by mouth at bedtime.  Modified Medications   Modified Medication Previous Medication   RISPERIDONE (RISPERDAL) 0.5 MG TABLET risperiDONE (RISPERDAL) 0.5 MG tablet      Take 1 mg by mouth 2 (two) times daily.    Take 1 tablet (0.5 mg total) by mouth 2 (two) times daily.  Discontinued Medications   No medications on file    SIGNIFICANT DIAGNOSTIC EXAMS   08-21-14: chest x-ray: Cardiomegaly without acute disease.   LABS REVIEWED:  08-21-14: wbc 10.9; hgb 11.1; hct 33.8; mcv 91.6; plt 227; glucose 117; bun 29; creat 1.61; k+5.0; na++142; liver normal albumin 3.4 BNP 90.9; urine culture: pseudomonas aeruginosa  08-22-14: wbc 6.6; hgb 10.5; hct 32.7; mcv 91.9; plt 209; glucose 137; bun 24; creat 1.18; k+4.4; na++143  08-29-14: glucose 152; bun 49; creat 1.4; k+5.7; na++142     Review of Systems  Unable to perform ROS    Physical Exam  Constitutional: She appears well-developed and well-nourished. No distress.  Overweight  Neck: Neck supple. No JVD present. No thyromegaly present.  Cardiovascular: Normal rate, regular rhythm and intact distal pulses.   Respiratory: Effort normal and breath sounds normal.  GI: Soft. Bowel sounds are normal. She exhibits no distension.  Musculoskeletal: She exhibits no edema.  Is able to move extremitites   Neurological:  Remains Lethargic today   Skin: Skin is warm and dry. She is not diaphoretic.  Left buttock: 7 x 7 cm bruised area  which is blanchable Right buttock: 12 x 11 cm bruised area which is blanchable with an open area of 3 x 3 x 0.2 cm with pink wound bed no signs of infection present.       ASSESSMENT/  PLAN:  1. Acute on chronic renal failure:  Will begin 1/2 ns at 75 cc for one liter and repeat bmp.   2. Anxiety with depression: will lower the risperdal to 0.5 mg twice daily and the trazodone to 50 mg nightly and will monitor her status. The goal is to have be more alert and awake will monitor her status.   Time spent with patient and family 55 minutes    Ok Edwards NP [facility]  Contact [phone] Monday through Friday 8am- 5pm  After hours call [phone]

## 2014-08-30 NOTE — Progress Notes (Signed)
Patient ID: Linda [name], female   DOB: 1929-11-27, 80 y.o.   MRN: [id number]     [facility]  Allergies  Allergen Reactions  . Atorvastatin Other (See Comments)    REACTION: increased CPK  . Cephalexin Nausea Only and Other (See Comments)    Weak, Headache, has tolerated ceftriaxone  . Cetirizine Hcl Other (See Comments)    REACTION: makes her head feel big  . Cortisone Other (See Comments)    REACTION: increased bp  . Ezetimibe-Simvastatin Other (See Comments)    unknown  . Fentanyl Other (See Comments)    REACTION: stinging in her skin  . Guaifenesin Other (See Comments)    unknown  . Ketorolac Tromethamine     REACTION: no work  . Lisinopril Cough    ACE related  . Metformin Other (See Comments)    epistaxis  . Nsaids Other (See Comments)    epistaxis  . Penicillins Nausea Only and Other (See Comments)    Headache, has tolerated Zosyn  . Rofecoxib Itching  . Rosuvastatin Other (See Comments)    Myalgias  . Simvastatin Other (See Comments)    unknown  . Spironolactone Nausea Only  . Sulfamethoxazole-Tmp Ds Other (See Comments)    unknown  . Codeine Rash  . Tape Rash     Chief Complaint  Patient presents with  . Acute Visit    family concerns     HPI:  Her daughter would like to discuss her medication regimen. She has been on long term risperdal 2 mg twice daily in order to manage her anxiety and depression. She has struggled with her anger, anxiety and depression over her dementia. She normally does fairly well with risperdal 1 mg twice daily; lexapro 20 mg daily and trazodone 100 mg nightly. We discuss her increased lethargy since her admission to this facility her poor ability to participate in therapy and to eat and drink. She feels as though her mother will get better some more time to recover from her uti. She would feel better with the risperdal returned back to her baseline dose. We also discussed her pain management regimen.   Past Medical History    Diagnosis Date  . Allergy     allergic rhinitis  . Diabetes mellitus     type II  . Hypertension   . Osteopenia   . TIA (transient ischemic attack)   . Contact dermatitis     on buttocks  . Nosebleed   . Anxiety   . Renal insufficiency   . Pyelonephritis   . Dementia   . Chiari malformation   . Seizure disorder   . ROTATOR CUFF TEAR 10/13/2007    Qualifier: Diagnosis of  By: Marcelino Scot CMA, [place] Bil[place]      Past Surgical History  Procedure Laterality Date  . Rotator cuff repair  06/2007    right  . Abdominal hysterectomy    . Back surgery  03/1999    disk  . Breast surgery      breast biopsy x2 negative  . Shoulder surgery  2007    left  . Joint replacement      Rt knee patella replacement  . Colonoscopy  08/28/2012    Procedure: COLONOSCOPY;  Surgeon: Beryle Beams, MD;  Location[facility] ENDOSCOPY;  Service: Endoscopy;  Laterality: N/A;    VITAL SIGNS BP 110/79  Pulse 68  Ht 5\' 5"  (1.651 m)  Wt 218 lb (98.884 kg)  BMI 36.28 kg/m2   Patient's  Medications  New Prescriptions   No medications on file  Previous Medications   ACETAMINOPHEN (TYLENOL) 325 MG TABLET    Take 650 mg by mouth every 6 (six) hours as needed for mild pain or moderate pain (leg pain).   ALENDRONATE (FOSAMAX) 70 MG TABLET    Take 70 mg by mouth every 7 (seven) days. Saturday. Take with a full glass of water on an empty stomach.   ALUM & MAG HYDROXIDE-SIMETH (MAALOX/MYLANTA) 200-200-20 MG/5ML SUSPENSION    Take 30 mLs by mouth every 6 (six) hours as needed for indigestion or heartburn (dyspepsia).   CIPROFLOXACIN (CIPRO) 500 MG TABLET    Take 1 tablet (500 mg total) by mouth 2 (two) times daily.   CRANBERRY 250 MG TABS    Take 1 tablet (250 mg total) by mouth daily.   DONEPEZIL (ARICEPT) 10 MG TABLET    Take 20 mg by mouth at bedtime.    ESCITALOPRAM (LEXAPRO) 20 MG TABLET    Take 1 tablet (20 mg total) by mouth daily. For nerves   FEEDING SUPPLEMENT, GLUCERNA SHAKE, (GLUCERNA SHAKE) LIQD    Take  237 mLs by mouth 3 (three) times daily between meals.   LEVETIRACETAM (KEPPRA) 500 MG TABLET    Take 1 tablet (500 mg total) by mouth 2 (two) times daily.   MIRABEGRON ER (MYRBETRIQ) 25 MG TB24 TABLET    Take 25 mg by mouth daily.   NYSTATIN (MYCOSTATIN) POWDER    Apply 1 g topically daily. Apply under breasts and under stomach fold.   ONDANSETRON (ZOFRAN) 4 MG TABLET    Take 4 mg by mouth every 8 (eight) hours as needed for nausea or vomiting (nausea).    OXYCODONE-ACETAMINOPHEN (PERCOCET) 10-325 MG PER TABLET    Take one tablet by mouth every 4 hours as needed for neck,back,shoulder and knee pain; Take one tablet by mouth every night at bedtime   RISPERIDONE (RISPERDAL) 0.5 MG TABLET    Take 1 tablet (0.5 mg total) by mouth 2 (two) times daily.   SENNA (SENOKOT) 8.6 MG TABS TABLET    Take 1 tablet by mouth daily as needed for mild constipation (constipation).    TRAZODONE (DESYREL) 100 MG TABLET    Take 1 tablet (100 mg total) by mouth at bedtime.  Modified Medications   No medications on file  Discontinued Medications   No medications on file    SIGNIFICANT DIAGNOSTIC EXAMS  08-21-14: chest x-ray: Cardiomegaly without acute disease.   LABS REVIEWED:  08-21-14: wbc 10.9; hgb 11.1; hct 33.8; mcv 91.6; plt 227; glucose 117; bun 29; creat 1.61; k+5.0; na++142; liver normal albumin 3.4 BNP 90.9; urine culture: pseudomonas aeruginosa  08-22-14: wbc 6.6; hgb 10.5; hct 32.7; mcv 91.9; plt 209; glucose 137; bun 24; creat 1.18; k+4.4; na++143     Review of Systems  Unable to perform ROS    Physical Exam  Constitutional: She appears well-developed and well-nourished. No distress.  Overweight  Neck: Neck supple. No JVD present. No thyromegaly present.  Cardiovascular: Normal rate, regular rhythm and intact distal pulses.   Respiratory: Effort normal and breath sounds normal.  GI: Soft. Bowel sounds are normal. She exhibits no distension.  Musculoskeletal: She exhibits no edema.  Is able  to move extremitites   Neurological:  Remains Lethargic today   Skin: Skin is warm and dry. She is not diaphoretic.  Left buttock: 7 x 7 cm bruised area which is blanchable Right buttock: 12 x 11 cm bruised area which  is blanchable with an open area of 3 x 3 x 0.2 cm with pink wound bed no signs of infection present.       ASSESSMENT/ PLAN:  DDD cervical spine with pain in back neck shoulders; knees: will being tylenol 650 mg in the am and percocet 10/325 mg at night and keep prn dosing for her pain management  2. Anxiety with depression: after our prolonged discussion at this time; will resume her risperdal at 1 mg twice daily; her family would feel better with her on this medication dose. She is aware that Reese remains lethargic; will monitor her status   Time spent with patient 50 minutes   Ok Edwards NP [facility] Adult Medicine  Contact [phone] Monday through Friday 8am- 5pm  After hours call [phone]

## 2014-08-31 ENCOUNTER — Encounter: Payer: Self-pay | Admitting: Adult Health

## 2014-08-31 ENCOUNTER — Non-Acute Institutional Stay (SKILLED_NURSING_FACILITY): Payer: Medicare Other | Admitting: Adult Health

## 2014-08-31 DIAGNOSIS — N179 Acute kidney failure, unspecified: Secondary | ICD-10-CM

## 2014-08-31 DIAGNOSIS — G9340 Encephalopathy, unspecified: Secondary | ICD-10-CM

## 2014-08-31 DIAGNOSIS — N390 Urinary tract infection, site not specified: Secondary | ICD-10-CM

## 2014-08-31 DIAGNOSIS — N189 Chronic kidney disease, unspecified: Secondary | ICD-10-CM

## 2014-08-31 NOTE — Progress Notes (Signed)
Patient ID: Linda [name], female   DOB: Mar 12, 1930, 80 y.o.   MRN: [id number]     Linda [name]  Allergies  Allergen Reactions  . Atorvastatin Other (See Comments)    REACTION: increased CPK  . Cephalexin Nausea Only and Other (See Comments)    Weak, Headache, has tolerated ceftriaxone  . Cetirizine Hcl Other (See Comments)    REACTION: makes her head feel big  . Cortisone Other (See Comments)    REACTION: increased bp  . Ezetimibe-Simvastatin Other (See Comments)    unknown  . Fentanyl Other (See Comments)    REACTION: stinging in her skin  . Guaifenesin Other (See Comments)    unknown  . Ketorolac Tromethamine     REACTION: no work  . Lisinopril Cough    ACE related  . Metformin Other (See Comments)    epistaxis  . Nsaids Other (See Comments)    epistaxis  . Penicillins Nausea Only and Other (See Comments)    Headache, has tolerated Zosyn  . Rofecoxib Itching  . Rosuvastatin Other (See Comments)    Myalgias  . Simvastatin Other (See Comments)    unknown  . Spironolactone Nausea Only  . Sulfamethoxazole-Tmp Ds Other (See Comments)    unknown  . Codeine Rash  . Tape Rash     Chief Complaint  Patient presents with  . Acute Visit    followo up lab work     HPI:  Her renal function is slightly worse since receiving her liter of fluid via clysis. Her bun is up to 62 with a creat of 1.5. She remains lethargic with a poor po intake. I have spoken with Linda [name] regarding her status; the plan to insert a picc line; being more aggressive ivf and to culture her urine. She is in agreement with this plan of action and to keep her out of the hospital if at all possible. We did discuss further lowering her psych medications in order for her to become more alert to her surroundings, Linda [name] is in agreement with this.    Past Medical History  Diagnosis Date  . Allergy     allergic rhinitis  . Diabetes mellitus     type II  . Hypertension   . Osteopenia   . TIA  (transient ischemic attack)   . Contact dermatitis     on buttocks  . Nosebleed   . Anxiety   . Renal insufficiency   . Pyelonephritis   . Dementia   . Chiari malformation   . Seizure disorder   . ROTATOR CUFF TEAR 10/13/2007    Qualifier: Diagnosis of  By: Linda [name]      Past Surgical History  Procedure Laterality Date  . Rotator cuff repair  06/2007    right  . Abdominal hysterectomy    . Back surgery  03/1999    disk  . Breast surgery      breast biopsy x2 negative  . Shoulder surgery  2007    left  . Joint replacement      Rt knee patella replacement  . Colonoscopy  08/28/2012    Procedure: COLONOSCOPY;  Surgeon: Linda Beams, MD;  Location: [facility] ENDOSCOPY;  Service: Endoscopy;  Laterality: N/A;    VITAL SIGNS BP 154/62  [name] 86  Ht 5\' 5"  (1.651 m)  Wt 209 lb (94.802 kg)  BMI 34.78 kg/m2   Patient's Medications  New Prescriptions   No medications on file  Previous Medications  ACETAMINOPHEN (TYLENOL) 325 MG TABLET    Take 2 tablets (650 mg total) by mouth daily.   ALENDRONATE (FOSAMAX) 70 MG TABLET    Take 70 mg by mouth every 7 (seven) days. Saturday. Take with a full glass of water on an empty stomach.   ALUM & MAG HYDROXIDE-SIMETH (MAALOX/MYLANTA) 200-200-20 MG/5ML SUSPENSION    Take 30 mLs by mouth every 6 (six) hours as needed for indigestion or heartburn (dyspepsia).   CIPROFLOXACIN (CIPRO) 500 MG TABLET    Take 1 tablet (500 mg total) by mouth 2 (two) times daily.   CRANBERRY 250 MG TABS    Take 1 tablet (250 mg total) by mouth daily.   DONEPEZIL (ARICEPT) 10 MG TABLET    Take 20 mg by mouth at bedtime.    ESCITALOPRAM (LEXAPRO) 20 MG TABLET    Take 1 tablet (20 mg total) by mouth daily. For nerves   FEEDING SUPPLEMENT, GLUCERNA SHAKE, (GLUCERNA SHAKE) LIQD    Take 237 mLs by mouth 3 (three) times daily between meals.   LEVETIRACETAM (KEPPRA) 500 MG TABLET    Take 1 tablet (500 mg total) by mouth 2 (two) times daily.   MIRABEGRON ER  (MYRBETRIQ) 25 MG TB24 TABLET    Take 25 mg by mouth daily.   NYSTATIN (MYCOSTATIN) POWDER    Apply 1 g topically daily. Apply under breasts and under stomach fold.   ONDANSETRON (ZOFRAN) 4 MG TABLET    Take 4 mg by mouth every 8 (eight) hours as needed for nausea or vomiting (nausea).    OXYCODONE-ACETAMINOPHEN (PERCOCET) 10-325 MG PER TABLET    Take one tablet by mouth every 4 hours as needed for neck,back,shoulder and knee pain; Take one tablet by mouth every night at bedtime   RISPERIDONE (RISPERDAL) 0.5 MG TABLET    Take 1 tablet (0.5 mg total) by mouth 2 (two) times daily.   SENNA (SENOKOT) 8.6 MG TABS TABLET    Take 1 tablet by mouth daily as needed for mild constipation (constipation).    TRAZODONE (DESYREL) 100 MG TABLET    Take 0.5 tablets (50 mg total) by mouth at bedtime.  Modified Medications   No medications on file  Discontinued Medications   No medications on file    SIGNIFICANT DIAGNOSTIC EXAMS   08-21-14: chest x-ray: Cardiomegaly without acute disease.   LABS REVIEWED:  08-21-14: wbc 10.9; hgb 11.1; hct 33.8; mcv 91.6; plt 227; glucose 117; bun 29; creat 1.61; k+5.0; na++142; liver normal albumin 3.4 BNP 90.9; urine culture: pseudomonas aeruginosa  08-22-14: wbc 6.6; hgb 10.5; hct 32.7; mcv 91.9; plt 209; glucose 137; bun 24; creat 1.18; k+4.4; na++143  08-29-14: glucose 152; bun 49; creat 1.4; k+5.7; na++142  08-30-14: glucose 167; bu 62; creat 1.5; k+5.5; na++ 143     Review of Systems  Unable to perform ROS    Physical Exam  Constitutional: She appears well-developed and well-nourished. No distress.  Overweight  Neck: Neck supple. No JVD present. No thyromegaly present.  Cardiovascular: Normal rate, regular rhythm and intact distal pulses.   Respiratory: Effort normal and breath sounds normal.  GI: Soft. Bowel sounds are normal. She exhibits no distension.  Musculoskeletal: She exhibits no edema.  Is able to move extremitites   Neurological:  Remains  Lethargic today   Skin: Skin is warm and dry. She is not diaphoretic.  Left buttock: 7 x 7 cm bruised area which is blanchable Right buttock: 12 x 11 cm bruised area which is blanchable with  an open area of 3 x 3 x 0.2 cm with pink wound bed no signs of infection present.       ASSESSMENT/ PLAN:  1. Acute on chronic renal failure:  Will begin 1/2 ns at 75 cc via picc line will check stat bmp in the am to assess response to ivf.    2. UTI: she completes her abt today. Will repeat urine culture now to determine if she has cleared her infection.   If she remains lethargic will need to consider further reducing her risperdal; trazodone and lexapro will continue to monitor her status.  Will [name] her on daily weights   Ok Edwards NP [facility] Adult Medicine  Contact [phone] Monday through Friday 8am- 5pm  After hours call [phone]

## 2014-09-01 ENCOUNTER — Encounter: Payer: Self-pay | Admitting: Internal Medicine

## 2014-09-01 ENCOUNTER — Non-Acute Institutional Stay (SKILLED_NURSING_FACILITY): Payer: Medicare Other | Admitting: Internal Medicine

## 2014-09-01 DIAGNOSIS — M810 Age-related osteoporosis without current pathological fracture: Secondary | ICD-10-CM

## 2014-09-01 DIAGNOSIS — L8992 Pressure ulcer of unspecified site, stage 2: Secondary | ICD-10-CM

## 2014-09-01 DIAGNOSIS — R5381 Other malaise: Secondary | ICD-10-CM

## 2014-09-01 DIAGNOSIS — F0390 Unspecified dementia without behavioral disturbance: Secondary | ICD-10-CM

## 2014-09-01 DIAGNOSIS — R32 Unspecified urinary incontinence: Secondary | ICD-10-CM

## 2014-09-01 DIAGNOSIS — F418 Other specified anxiety disorders: Secondary | ICD-10-CM

## 2014-09-01 DIAGNOSIS — N289 Disorder of kidney and ureter, unspecified: Secondary | ICD-10-CM

## 2014-09-01 NOTE — Progress Notes (Signed)
This encounter was created in error - please disregard.

## 2014-09-01 NOTE — Progress Notes (Signed)
Patient ID: Linda [name], female   DOB: [birth year], 80 y.o.   MRN: [id number]     Facility: [facility]    PCP: Leonides Sake, MD  Code Status: dnr  Allergies  Allergen Reactions  . Atorvastatin Other (See Comments)    REACTION: increased CPK  . Cephalexin Nausea Only and Other (See Comments)    Weak, Headache, has tolerated ceftriaxone  . Cetirizine Hcl Other (See Comments)    REACTION: makes her head feel big  . Cortisone Other (See Comments)    REACTION: increased bp  . Ezetimibe-Simvastatin Other (See Comments)    unknown  . Fentanyl Other (See Comments)    REACTION: stinging in her skin  . Guaifenesin Other (See Comments)    unknown  . Ketorolac Tromethamine     REACTION: no work  . Lisinopril Cough    ACE related  . Metformin Other (See Comments)    epistaxis  . Nsaids Other (See Comments)    epistaxis  . Penicillins Nausea Only and Other (See Comments)    Headache, has tolerated Zosyn  . Rofecoxib Itching  . Rosuvastatin Other (See Comments)    Myalgias  . Simvastatin Other (See Comments)    unknown  . Spironolactone Nausea Only  . Sulfamethoxazole-Trimethoprim Other (See Comments)    unknown  . Codeine Rash  . Tape Rash    Chief Complaint: new admit  HPI:  80 y/o female patient is here for STR after hospital admission from 08/21/14-08/24/14 with acute encephalopathy in setting of pseudomonas UTI. She has been lethargic in the facility and her antipsychotics dosing has been reduced. . With her decreased po intake, she was started on half NS yesterday and is pending a bmp check today. She is lying in her bed, appears weak and is asleep.  She has history of type 2 DM, seizure disorder, dementia, osteoporosis, recurrent uti, anemia of chronic disease and osteoporosis.  Review of Systems:  Unable to obtain    Past Medical History  Diagnosis Date  . Allergy     allergic rhinitis  . Diabetes mellitus     type II  .  Hypertension   . Osteopenia   . TIA (transient ischemic attack)   . Contact dermatitis     on buttocks  . Nosebleed   . Anxiety   . Renal insufficiency   . Pyelonephritis   . Dementia   . Chiari malformation   . Seizure disorder   . ROTATOR CUFF TEAR 10/13/2007    Qualifier: Diagnosis of  By: Marcelino Scot CMA, [place] Bilberry     Past Surgical History  Procedure Laterality Date  . Rotator cuff repair  06/2007    right  . Abdominal hysterectomy    . Back surgery  03/1999    disk  . Breast surgery      breast biopsy x2 negative  . Shoulder surgery  2007    left  . Joint replacement      Rt knee patella replacement  . Colonoscopy  08/28/2012    Procedure: COLONOSCOPY;  Surgeon: Beryle Beams, MD;  Location: [facility] ENDOSCOPY;  Service: Endoscopy;  Laterality: N/A;   Social History:   reports that she has never smoked. She has never used smokeless tobacco. She reports that she does not drink alcohol or use illicit drugs.  Family History  Problem Relation Age of Onset  . Stroke Mother   . Stroke Father     Medications: Patient's Medications  New  Prescriptions   No medications on file  Previous Medications   ACETAMINOPHEN (TYLENOL) 325 MG TABLET    Take 2 tablets (650 mg total) by mouth daily.   ALENDRONATE (FOSAMAX) 70 MG TABLET    Take 70 mg by mouth every 7 (seven) days. Saturday. Take with a full glass of water on an empty stomach.   ALUM & MAG HYDROXIDE-SIMETH (MAALOX/MYLANTA) 200-200-20 MG/5ML SUSPENSION    Take 30 mLs by mouth every 6 (six) hours as needed for indigestion or heartburn (dyspepsia).   CRANBERRY 250 MG TABS    Take 1 tablet (250 mg total) by mouth daily.   DONEPEZIL (ARICEPT) 10 MG TABLET    Take 20 mg by mouth at bedtime.    ESCITALOPRAM (LEXAPRO) 20 MG TABLET    Take 1 tablet (20 mg total) by mouth daily. For nerves   FEEDING SUPPLEMENT, GLUCERNA SHAKE, (GLUCERNA SHAKE) LIQD    Take 237 mLs by mouth 3 (three) times daily between meals.   LEVETIRACETAM (KEPPRA)  500 MG TABLET    Take 1 tablet (500 mg total) by mouth 2 (two) times daily.   MIRABEGRON ER (MYRBETRIQ) 25 MG TB24 TABLET    Take 25 mg by mouth daily.   NYSTATIN (MYCOSTATIN) POWDER    Apply 1 g topically daily. Apply under breasts and under stomach fold.   ONDANSETRON (ZOFRAN) 4 MG TABLET    Take 4 mg by mouth every 8 (eight) hours as needed for nausea or vomiting (nausea).    OXYCODONE-ACETAMINOPHEN (PERCOCET) 10-325 MG PER TABLET    Take one tablet by mouth every 4 hours as needed for neck,back,shoulder and knee pain; Take one tablet by mouth every night at bedtime   SENNA (SENOKOT) 8.6 MG TABS TABLET    Take 1 tablet by mouth daily as needed for mild constipation (constipation).   Modified Medications   Modified Medication Previous Medication   RISPERIDONE (RISPERDAL) 0.5 MG TABLET risperiDONE (RISPERDAL) 0.5 MG tablet      Take 1 tablet (0.5 mg total) by mouth 2 (two) times daily as needed.    Take 1 tablet (0.5 mg total) by mouth 2 (two) times daily.  Discontinued Medications   CIPROFLOXACIN (CIPRO) 500 MG TABLET    Take 1 tablet (500 mg total) by mouth 2 (two) times daily.   TRAZODONE (DESYREL) 100 MG TABLET    Take 0.5 tablets (50 mg total) by mouth at bedtime.     Physical Exam: Filed Vitals:   09/01/14 1241  BP: 140/78  Pulse: 83  Temp: 98.9 F (37.2 C)  Resp: 16  SpO2: 95%   General- elderly female in no acute distress, ill appearing Head- atraumatic, normocephalic Eyes- no pallor, no icterus, no discharge Neck- no lymphadenopathy Cardiovascular- normal s1,s2, no murmurs Respiratory- bilateral clear to auscultation, no wheeze, no rhonchi, no crackles, no use of accessory muscles Abdomen- bowel sounds present, soft, non tender Musculoskeletal- able to move all 4 extremities, no leg edema Neurological- unable to assess, lethargic Skin- warm and dry, left arm skin tear, right buttock stage 2 pressure ulcer, picc line in place, skin bruises Psychiatry- unable to  assess   Labs reviewed: Basic Metabolic Panel:  Recent Labs  07/12/14 0401 08/21/14 1500 08/22/14 0928  NA 143 142 143  K 4.0 5.0 4.4  CL 100 104 104  CO2 30 28 26   GLUCOSE 110* 117* 137*  BUN 21 29* 24*  CREATININE 1.53* 1.61* 1.18*  CALCIUM 8.7 9.6 9.1   Liver Function Tests:  Recent Labs  04/11/14 0617 07/10/14 1234 08/21/14 1500  AST 10 13 13   ALT 6 8 9   ALKPHOS 46 46 45  BILITOT 0.3 0.4 0.3  PROT 5.3* 6.6 6.4  ALBUMIN 2.8* 3.5 3.4*   No results for input(s): LIPASE, AMYLASE in the last 8760 hours.  Recent Labs  04/10/14 1855  AMMONIA 34   CBC:  Recent Labs  04/10/14 1855  07/10/14 1234 08/21/14 1500 08/22/14 0928  WBC 7.4  < > 7.6 10.9* 6.6  NEUTROABS 4.6  --  5.5 8.5*  --   HGB 11.9*  < > 11.9* 11.1* 10.5*  HCT 37.8  < > 37.3 33.8* 32.7*  MCV 92.6  < > 90.1 91.6 91.9  PLT 203  < > 221 227 209  < > = values in this interval not displayed. Cardiac Enzymes:  Recent Labs  04/10/14 1855 07/10/14 1234 08/21/14 1639  TROPONINI <0.30 <0.30 <0.30   BNP: Invalid input(s): POCBNP CBG:  Recent Labs  08/22/14 0755 08/22/14 1141 08/24/14 0802  GLUCAP 116* 105* 94    Assessment/Plan  Physical deconditioning Will have patient work with PT/OT as tolerated to regain strength and restore function as tolerated.  Fall precautions are in place. Continue iv fluids for now and monitor bmp. Encourage po intake once alert to take po feed. Monitor weight.   Stage 2 pressure ulcer Continue skin care, pressure ulcer prophylaxis, vit c and zinc supplement for now  Dementia Continue aricept and monitor, decline anticipated  Impaired renal function On iv fluids, reassess with labs  UTI Has completed her antibiotics. Continue cranberry supplement  Osteoporosis Continue fosamax  Urinary incontinence Continue myrbetriq  Depression/anxiety continue risperdal 0.5 mg bid and trazodone 50 mg qhs for now. Continue lexapro   Family/ staff  Communication: reviewed care plan with patient and nursing supervisor  Goals of care: short term rehabilitation    Labs/tests ordered: review bmp    Blanchie Serve, MD  [place] [phone] (Monday-Friday 8 am - 5 pm) [phone] (afterhours)

## 2014-09-02 ENCOUNTER — Ambulatory Visit: Payer: Self-pay

## 2014-09-02 LAB — BASIC METABOLIC PANEL
Anion Gap: 2 — ABNORMAL LOW (ref 7–16)
BUN: 60 mg/dL — ABNORMAL HIGH (ref 7–18)
Calcium, Total: 8.6 mg/dL (ref 8.5–10.1)
Chloride: 109 mmol/L — ABNORMAL HIGH (ref 98–107)
Co2: 37 mmol/L — ABNORMAL HIGH (ref 21–32)
Creatinine: 1.21 mg/dL (ref 0.60–1.30)
EGFR (African American): 55 — ABNORMAL LOW
EGFR (Non-African Amer.): 45 — ABNORMAL LOW
GLUCOSE: 141 mg/dL — AB (ref 65–99)
Osmolality: 314 (ref 275–301)
POTASSIUM: 4.1 mmol/L (ref 3.5–5.1)
Sodium: 148 mmol/L — ABNORMAL HIGH (ref 136–145)

## 2014-09-04 ENCOUNTER — Non-Acute Institutional Stay (SKILLED_NURSING_FACILITY): Payer: Medicare Other | Admitting: Adult Health

## 2014-09-04 DIAGNOSIS — N179 Acute kidney failure, unspecified: Secondary | ICD-10-CM

## 2014-09-04 DIAGNOSIS — N189 Chronic kidney disease, unspecified: Secondary | ICD-10-CM

## 2014-09-04 DIAGNOSIS — F418 Other specified anxiety disorders: Secondary | ICD-10-CM

## 2014-09-04 DIAGNOSIS — R0902 Hypoxemia: Secondary | ICD-10-CM

## 2014-09-05 DIAGNOSIS — R0902 Hypoxemia: Secondary | ICD-10-CM | POA: Insufficient documentation

## 2014-09-05 MED ORDER — RISPERIDONE 0.5 MG PO TABS: 0.5000 mg | ORAL_TABLET | Freq: Two times a day (BID) | ORAL | Status: DC | PRN

## 2014-09-05 NOTE — Progress Notes (Signed)
Patient ID: Linda [name], female   DOB: 1930-02-22, 80 y.o.   MRN: [id number]     [facility]  Allergies  Allergen Reactions  . Atorvastatin Other (See Comments)    REACTION: increased CPK  . Cephalexin Nausea Only and Other (See Comments)    Weak, Headache, has tolerated ceftriaxone  . Cetirizine Hcl Other (See Comments)    REACTION: makes her head feel big  . Cortisone Other (See Comments)    REACTION: increased bp  . Ezetimibe-Simvastatin Other (See Comments)    unknown  . Fentanyl Other (See Comments)    REACTION: stinging in her skin  . Guaifenesin Other (See Comments)    unknown  . Ketorolac Tromethamine     REACTION: no work  . Lisinopril Cough    ACE related  . Metformin Other (See Comments)    epistaxis  . Nsaids Other (See Comments)    epistaxis  . Penicillins Nausea Only and Other (See Comments)    Headache, has tolerated Zosyn  . Rofecoxib Itching  . Rosuvastatin Other (See Comments)    Myalgias  . Simvastatin Other (See Comments)    unknown  . Spironolactone Nausea Only  . Sulfamethoxazole-Tmp Ds Other (See Comments)    unknown  . Codeine Rash  . Tape Rash     Chief Complaint  Patient presents with  . Acute Visit    follow up lab work     HPI:  Her renal failure has resolved with the use of ivf. Her risperdal was reduced and trazodone was stopped by Dr. Bubba Camp. She is awake telling me that she is feeling good but sleepy. The staff reports taht she is eating and drinking better; and trying to get up on occasions. She continues to require the use of  02.from what I can find in her medical record she hsa been on 02 since August of this year due to hypoxia related to copd.    Past Medical History  Diagnosis Date  . Allergy     allergic rhinitis  . Diabetes mellitus     type II  . Hypertension   . Osteopenia   . TIA (transient ischemic attack)   . Contact dermatitis     on buttocks  . Nosebleed   . Anxiety   . Renal insufficiency   .  Pyelonephritis   . Dementia   . Chiari malformation   . Seizure disorder   . ROTATOR CUFF TEAR 10/13/2007    Qualifier: Diagnosis of  By: Marcelino Scot CMA, [place] Bilberry      Past Surgical History  Procedure Laterality Date  . Rotator cuff repair  06/2007    right  . Abdominal hysterectomy    . Back surgery  03/1999    disk  . Breast surgery      breast biopsy x2 negative  . Shoulder surgery  2007    left  . Joint replacement      Rt knee patella replacement  . Colonoscopy  08/28/2012    Procedure: COLONOSCOPY;  Surgeon: Beryle Beams, MD;  Location: [facility] ENDOSCOPY;  Service: Endoscopy;  Laterality: N/A;    VITAL SIGNS BP 117/68  Pulse 68  Ht 5\' 5"  (1.651 m)  Wt 202 lb (91.627 kg)  BMI 33.61 kg/m2   Patient's Medications  New Prescriptions   No medications on file  Previous Medications   ACETAMINOPHEN (TYLENOL) 325 MG TABLET    Take 2 tablets (650 mg total) by mouth daily.   ALENDRONATE (  FOSAMAX) 70 MG TABLET    Take 70 mg by mouth every 7 (seven) days. Saturday. Take with a full glass of water on an empty stomach.   ALUM & MAG HYDROXIDE-SIMETH (MAALOX/MYLANTA) 200-200-20 MG/5ML SUSPENSION    Take 30 mLs by mouth every 6 (six) hours as needed for indigestion or heartburn (dyspepsia).   CRANBERRY 250 MG TABS    Take 1 tablet (250 mg total) by mouth daily.   DONEPEZIL (ARICEPT) 10 MG TABLET    Take 20 mg by mouth at bedtime.    ESCITALOPRAM (LEXAPRO) 20 MG TABLET    Take 1 tablet (20 mg total) by mouth daily. For nerves   FEEDING SUPPLEMENT, GLUCERNA SHAKE, (GLUCERNA SHAKE) LIQD    Take 237 mLs by mouth 3 (three) times daily between meals.   LEVETIRACETAM (KEPPRA) 500 MG TABLET    Take 1 tablet (500 mg total) by mouth 2 (two) times daily.   MIRABEGRON ER (MYRBETRIQ) 25 MG TB24 TABLET    Take 25 mg by mouth daily.   NYSTATIN (MYCOSTATIN) POWDER    Apply 1 g topically daily. Apply under breasts and under stomach fold.   ONDANSETRON (ZOFRAN) 4 MG TABLET    Take 4 mg by mouth every 8  (eight) hours as needed for nausea or vomiting (nausea).    OXYCODONE-ACETAMINOPHEN (PERCOCET) 10-325 MG PER TABLET    Take one tablet by mouth every 4 hours as needed for neck,back,shoulder and knee pain; Take one tablet by mouth every night at bedtime   SENNA (SENOKOT) 8.6 MG TABS TABLET    Take 1 tablet by mouth daily as needed for mild constipation (constipation).   Modified Medications   Modified Medication Previous Medication   RISPERIDONE (RISPERDAL) 0.5 MG TABLET risperiDONE (RISPERDAL) 0.5 MG tablet      Take 1 tablet (0.5 mg total) by mouth 2 (two) times daily as needed.    Take 1 tablet (0.5 mg total) by mouth 2 (two) times daily.  Discontinued Medications   CIPROFLOXACIN (CIPRO) 500 MG TABLET    Take 1 tablet (500 mg total) by mouth 2 (two) times daily.   TRAZODONE (DESYREL) 100 MG TABLET    Take 0.5 tablets (50 mg total) by mouth at bedtime.    SIGNIFICANT DIAGNOSTIC EXAMS  08-21-14: chest x-ray: Cardiomegaly without acute disease.   LABS REVIEWED:  08-21-14: wbc 10.9; hgb 11.1; hct 33.8; mcv 91.6; plt 227; glucose 117; bun 29; creat 1.61; k+5.0; na++142; liver normal albumin 3.4 BNP 90.9; urine culture: pseudomonas aeruginosa  08-22-14: wbc 6.6; hgb 10.5; hct 32.7; mcv 91.9; plt 209; glucose 137; bun 24; creat 1.18; k+4.4; na++143  08-29-14: glucose 152; bun 49; creat 1.4; k+5.7; na++142  08-30-14: glucose 167; bu 62; creat 1.5; k+5.5; na++ 143  09-01-14: glucose 137 bun 86; creat 1.6; k+4.8; na++146 09-02-14: glucose 141; bun 60; creat 1.21; k+4.1; na++148 09-04-14: glucose 114; bun 25; creat 0.8; k+4.0; na++144     Review of Systems  Unable to perform ROS    Physical Exam  Constitutional: She appears well-developed and well-nourished. No distress.  Overweight  Neck: Neck supple. No JVD present. No thyromegaly present.  Cardiovascular: Normal rate, regular rhythm and intact distal pulses.   Respiratory: Effort normal and breath sounds normal.  GI: Soft. Bowel  sounds are normal. She exhibits no distension.  Musculoskeletal: She exhibits no edema.  Is able to move extremitites   Neurological:  Remains Lethargic today   Skin: Skin is warm and dry. She is not  diaphoretic.  Left buttock: 7 x 7 cm bruised area which is blanchable Right buttock: 12 x 11 cm bruised area which is blanchable with an open area of 3 x 3 x 0.2 cm with pink wound bed no signs of infection present.        ASSESSMENT/ PLAN:   1. Acute on chronic renal failure: her renal function has greatly improved she is awake and is able to take in fluids po. Will stop her ivf at this time. Will keep her picc line in at this time. Will repeat a bmp in one week; if her renal function remains stable will remove her picc line at that time.   2. COPD: per her discharge summary on 07-10-14: she had hypoxia with unknown etiology most likely "senile" copd.  She is on chronic 02 at this time due to her hypoxia. She does not require other medications at this time. This has not been worked up further since August of this year.

## 2014-09-15 ENCOUNTER — Non-Acute Institutional Stay (SKILLED_NURSING_FACILITY): Payer: Medicare Other | Admitting: Adult Health

## 2014-09-15 DIAGNOSIS — M7989 Other specified soft tissue disorders: Secondary | ICD-10-CM

## 2014-09-19 ENCOUNTER — Non-Acute Institutional Stay (SKILLED_NURSING_FACILITY): Payer: Medicare Other | Admitting: Adult Health

## 2014-09-19 DIAGNOSIS — M5030 Other cervical disc degeneration, unspecified cervical region: Secondary | ICD-10-CM

## 2014-09-19 DIAGNOSIS — G40909 Epilepsy, unspecified, not intractable, without status epilepticus: Secondary | ICD-10-CM

## 2014-09-19 DIAGNOSIS — R32 Unspecified urinary incontinence: Secondary | ICD-10-CM

## 2014-09-19 DIAGNOSIS — J449 Chronic obstructive pulmonary disease, unspecified: Secondary | ICD-10-CM

## 2014-09-19 DIAGNOSIS — F0390 Unspecified dementia without behavioral disturbance: Secondary | ICD-10-CM

## 2014-09-19 DIAGNOSIS — F418 Other specified anxiety disorders: Secondary | ICD-10-CM

## 2014-09-21 LAB — BASIC METABOLIC PANEL
BUN: 28 mg/dL — AB (ref 4–21)
Creatinine: 1.3 mg/dL — AB (ref 0.5–1.1)
POTASSIUM: 4.6 mmol/L (ref 3.4–5.3)
Sodium: 137 mmol/L (ref 137–147)

## 2014-09-25 ENCOUNTER — Encounter: Payer: Self-pay | Admitting: Adult Health

## 2014-09-25 NOTE — Progress Notes (Signed)
Patient ID: Linda [name], female   DOB: 05/05/1930, 80 y.o.   MRN: [id number]     [facility]   Allergies  Allergen Reactions  . Atorvastatin Other (See Comments)    REACTION: increased CPK  . Cephalexin Nausea Only and Other (See Comments)    Weak, Headache, has tolerated ceftriaxone  . Cetirizine Hcl Other (See Comments)    REACTION: makes her head feel big  . Cortisone Other (See Comments)    REACTION: increased bp  . Ezetimibe-Simvastatin Other (See Comments)    unknown  . Fentanyl Other (See Comments)    REACTION: stinging in her skin  . Guaifenesin Other (See Comments)    unknown  . Ketorolac Tromethamine     REACTION: no work  . Lisinopril Cough    ACE related  . Metformin Other (See Comments)    epistaxis  . Nsaids Other (See Comments)    epistaxis  . Penicillins Nausea Only and Other (See Comments)    Headache, has tolerated Zosyn  . Rofecoxib Itching  . Rosuvastatin Other (See Comments)    Myalgias  . Simvastatin Other (See Comments)    unknown  . Spironolactone Nausea Only  . Sulfamethoxazole-Trimethoprim Other (See Comments)    unknown  . Codeine Rash  . Tape Rash       Chief Complaint  Patient presents with  . Acute Visit    right upper extremity edema     HPI:  Nursing staff reports that her right upper extremity edema present. She has recently had a picc line removed from her right upper extremity. There are no indications of pain present. Her hand is warm and she has a strong radial pulse present.    Past Medical History  Diagnosis Date  . Allergy     allergic rhinitis  . Diabetes mellitus     type II  . Hypertension   . Osteopenia   . TIA (transient ischemic attack)   . Contact dermatitis     on buttocks  . Nosebleed   . Anxiety   . Renal insufficiency   . Pyelonephritis   . Dementia   . Chiari malformation   . Seizure disorder   . ROTATOR CUFF TEAR 10/13/2007    Qualifier: Diagnosis of  By: Marcelino Scot CMA, [place] Bilberry       Past Surgical History  Procedure Laterality Date  . Rotator cuff repair  06/2007    right  . Abdominal hysterectomy    . Back surgery  03/1999    disk  . Breast surgery      breast biopsy x2 negative  . Shoulder surgery  2007    left  . Joint replacement      Rt knee patella replacement  . Colonoscopy  08/28/2012    Procedure: COLONOSCOPY;  Surgeon: Beryle Beams, MD;  Location: [facility] ENDOSCOPY;  Service: Endoscopy;  Laterality: N/A;    VITAL SIGNS BP 127/78 mmHg  Pulse 69  Ht 5\' 5"  (1.651 m)  Wt 202 lb (91.627 kg)  BMI 33.61 kg/m2   Outpatient Encounter Prescriptions as of 09/15/2014  Medication Sig  . acetaminophen (TYLENOL) 325 MG tablet Take 2 tablets (650 mg total) by mouth daily.  Marland Kitchen alendronate (FOSAMAX) 70 MG tablet Take 70 mg by mouth every 7 (seven) days. Saturday. Take with a full glass of water on an empty stomach.  Marland Kitchen alum & mag hydroxide-simeth (MAALOX/MYLANTA) 200-200-20 MG/5ML suspension Take 30 mLs by mouth every 6 (six) hours as needed  for indigestion or heartburn (dyspepsia).  . Cranberry 250 MG TABS Take 1 tablet (250 mg total) by mouth daily.  Marland Kitchen donepezil (ARICEPT) 10 MG tablet Take 20 mg by mouth at bedtime.   Marland Kitchen escitalopram (LEXAPRO) 20 MG tablet Take 1 tablet (20 mg total) by mouth daily. For nerves  . feeding supplement, GLUCERNA SHAKE, (GLUCERNA SHAKE) LIQD Take 237 mLs by mouth 3 (three) times daily between meals.  . levETIRAcetam (KEPPRA) 500 MG tablet Take 1 tablet (500 mg total) by mouth 2 (two) times daily.  . mirabegron ER (MYRBETRIQ) 25 MG TB24 tablet Take 25 mg by mouth daily.  Marland Kitchen nystatin (MYCOSTATIN) powder Apply 1 g topically daily. Apply under breasts and under stomach fold.  . ondansetron (ZOFRAN) 4 MG tablet Take 4 mg by mouth every 8 (eight) hours as needed for nausea or vomiting (nausea).   Marland Kitchen oxyCODONE-acetaminophen (PERCOCET) 10-325 MG per tablet Take one tablet by mouth every 4 hours as needed for neck,back,shoulder and knee pain; Take  one tablet by mouth every night at bedtime  . risperiDONE (RISPERDAL) 0.5 MG tablet Take 1 tablet (0.5 mg total) by mouth 2 (two) times daily as needed.  . senna (SENOKOT) 8.6 MG TABS tablet Take 1 tablet by mouth daily as needed for mild constipation (constipation).      SIGNIFICANT DIAGNOSTIC EXAMS   08-21-14: chest x-ray: Cardiomegaly without acute disease.   LABS REVIEWED:  08-21-14: wbc 10.9; hgb 11.1; hct 33.8; mcv 91.6; plt 227; glucose 117; bun 29; creat 1.61; k+5.0; na++142; liver normal albumin 3.4 BNP 90.9; urine culture: pseudomonas aeruginosa  08-22-14: wbc 6.6; hgb 10.5; hct 32.7; mcv 91.9; plt 209; glucose 137; bun 24; creat 1.18; k+4.4; na++143  08-29-14: glucose 152; bun 49; creat 1.4; k+5.7; na++142  08-30-14: glucose 167; bu 62; creat 1.5; k+5.5; na++ 143  09-01-14: glucose 137 bun 86; creat 1.6; k+4.8; na++146 09-02-14: glucose 141; bun 60; creat 1.21; k+4.1; na++148 09-04-14: glucose 114; bun 25; creat 0.8; k+4.0; na++144  09-11-14: glucose 75; bun 23; creat 1.0; k+4.6; na++145      Review of Systems  Unable to perform ROS    Physical Exam  Constitutional: She appears well-developed and well-nourished. No distress.  Overweight  Neck: Neck supple. No JVD present. No thyromegaly present.  Cardiovascular: Normal rate, regular rhythm and intact distal pulses.   Respiratory: Effort normal and breath sounds normal.  GI: Soft. Bowel sounds are normal. She exhibits no distension.  Musculoskeletal: right upper 2+ extremity edema.strong radial pulse present no warmth or redness present.   Is able to move extremitites   Neurological: alert  Skin: Skin is warm and dry. She is not diaphoretic.        ASSESSMENT/ PLAN:  1. Right upper extremity edema : will obtain a venous doppler of the right upper extremity to assess for dvt and will further treat as indicated will elevate arm.     Ok Edwards NP [facility]  Contact [phone] Monday  through Friday 8am- 5pm  After hours call [phone]

## 2014-09-27 NOTE — Progress Notes (Signed)
Patient ID: RANEEN JAFFER, female   DOB: 1930-02-09, 80 y.o.   MRN: [id number]  [facility]      Allergies  Allergen Reactions  . Atorvastatin Other (See Comments)    REACTION: increased CPK  . Cephalexin Nausea Only and Other (See Comments)    Weak, Headache, has tolerated ceftriaxone  . Cetirizine Hcl Other (See Comments)    REACTION: makes her head feel big  . Cortisone Other (See Comments)    REACTION: increased bp  . Ezetimibe-Simvastatin Other (See Comments)    unknown  . Fentanyl Other (See Comments)    REACTION: stinging in her skin  . Guaifenesin Other (See Comments)    unknown  . Ketorolac Tromethamine     REACTION: no work  . Lisinopril Cough    ACE related  . Metformin Other (See Comments)    epistaxis  . Nsaids Other (See Comments)    epistaxis  . Penicillins Nausea Only and Other (See Comments)    Headache, has tolerated Zosyn  . Rofecoxib Itching  . Rosuvastatin Other (See Comments)    Myalgias  . Simvastatin Other (See Comments)    unknown  . Spironolactone Nausea Only  . Sulfamethoxazole-Trimethoprim Other (See Comments)    unknown  . Codeine Rash  . Tape Rash       Chief Complaint  Patient presents with  . Medical Management of Chronic Issues    HPI:  She is a long term resident of this facility being seen for the management of her chronic illnesses. Overall her status is stable; her edema in her right upper extremity is improving. She is emotionally stable. Her family does express concerns about her verbage; this does not necessarily mean that she needs a higher dose of risperdal. Staff reports that she is not sleeping all night at times. She is unable to participate in the hpi or ros.    Past Medical History  Diagnosis Date  . Allergy     allergic rhinitis  . Diabetes mellitus     type II  . Hypertension   . Osteopenia   . TIA (transient ischemic attack)   . Contact dermatitis     on buttocks  . Nosebleed   . Anxiety   .  Renal insufficiency   . Pyelonephritis   . Dementia   . Chiari malformation   . Seizure disorder   . ROTATOR CUFF TEAR 10/13/2007    Qualifier: Diagnosis of  By: Marcelino Scot CMA, [place] Bilberry      Past Surgical History  Procedure Laterality Date  . Rotator cuff repair  06/2007    right  . Abdominal hysterectomy    . Back surgery  03/1999    disk  . Breast surgery      breast biopsy x2 negative  . Shoulder surgery  2007    left  . Joint replacement      Rt knee patella replacement  . Colonoscopy  08/28/2012    Procedure: COLONOSCOPY;  Surgeon: Beryle Beams, MD;  Location: [facility] ENDOSCOPY;  Service: Endoscopy;  Laterality: N/A;    VITAL SIGNS BP 148/80 mmHg  Pulse 67  Ht 5\' 5"  (1.651 m)  Wt 207 lb 12.8 oz (94.257 kg)  BMI 34.58 kg/m2   Outpatient Encounter Prescriptions as of 09/19/2014  Medication Sig  . acetaminophen (TYLENOL) 325 MG tablet Take 2 tablets (650 mg total) by mouth daily.  Marland Kitchen alendronate (FOSAMAX) 70 MG tablet Take 70 mg by mouth every 7 (seven) days.  Saturday. Take with a full glass of water on an empty stomach.  Marland Kitchen alum & mag hydroxide-simeth (MAALOX/MYLANTA) 200-200-20 MG/5ML suspension Take 30 mLs by mouth every 6 (six) hours as needed for indigestion or heartburn (dyspepsia).  . Cranberry 250 MG TABS Take 1 tablet (250 mg total) by mouth daily.  Marland Kitchen donepezil (ARICEPT) 10 MG tablet Take 20 mg by mouth at bedtime.   Marland Kitchen escitalopram (LEXAPRO) 20 MG tablet Take 1 tablet (20 mg total) by mouth daily. For nerves  . feeding supplement, GLUCERNA SHAKE, (GLUCERNA SHAKE) LIQD Take 237 mLs by mouth 3 (three) times daily between meals.  . levETIRAcetam (KEPPRA) 500 MG tablet Take 1 tablet (500 mg total) by mouth 2 (two) times daily.  . mirabegron ER (MYRBETRIQ) 25 MG TB24 tablet Take 25 mg by mouth daily.  Marland Kitchen nystatin (MYCOSTATIN) powder Apply 1 g topically daily. Apply under breasts and under stomach fold.  . ondansetron (ZOFRAN) 4 MG tablet Take 4 mg by mouth every 8  (eight) hours as needed for nausea or vomiting (nausea).   Marland Kitchen oxyCODONE-acetaminophen (PERCOCET) 10-325 MG per tablet Take one tablet by mouth every 4 hours as needed for neck,back,shoulder and knee pain; Take one tablet by mouth every night at bedtime  . risperiDONE (RISPERDAL) 0.5 MG tablet Take 1 tablet (0.5 mg total) by mouth 2 (two) times daily as needed.  . senna (SENOKOT) 8.6 MG TABS tablet Take 1 tablet by mouth daily as needed for mild constipation (constipation).      SIGNIFICANT DIAGNOSTIC EXAMS   08-21-14: chest x-ray: Cardiomegaly without acute disease.  09-15-14: right upper extremity doppler: neg for dvt    LABS REVIEWED:  08-21-14: wbc 10.9; hgb 11.1; hct 33.8; mcv 91.6; plt 227; glucose 117; bun 29; creat 1.61; k+5.0; na++142; liver normal albumin 3.4 BNP 90.9; urine culture: pseudomonas aeruginosa  08-22-14: wbc 6.6; hgb 10.5; hct 32.7; mcv 91.9; plt 209; glucose 137; bun 24; creat 1.18; k+4.4; na++143  08-29-14: glucose 152; bun 49; creat 1.4; k+5.7; na++142  08-30-14: glucose 167; bu 62; creat 1.5; k+5.5; na++ 143  09-01-14: glucose 137 bun 86; creat 1.6; k+4.8; na++146 09-02-14: glucose 141; bun 60; creat 1.21; k+4.1; na++148 09-04-14: glucose 114; bun 25; creat 0.8; k+4.0; na++144  09-11-14: glucose 75; bun 23; creat 1.0; k+4.6; na++145      Review of Systems  Unable to perform ROS    Physical Exam  Constitutional: She appears well-developed and well-nourished. No distress.  Overweight  Neck: Neck supple. No JVD present. No thyromegaly present.  Cardiovascular: Normal rate, regular rhythm and intact distal pulses.   Respiratory: Effort normal and breath sounds normal.  GI: Soft. Bowel sounds are normal. She exhibits no distension.  Musculoskeletal:   Is able to move extremitites   Neurological: alert  Skin: Skin is warm and dry. She is not diaphoretic.      ASSESSMENT/ PLAN:  1. Osteoporosis: will continue fosamax 70 mg weekly; will monitor   2.  Seizure disorder: no known seizure activity present; will continue keppra 500 mg twice daily and will monitor   3. UI: is without change will continue myrbetriq 25 mg daily  Will continue cranberry daily to help prevent uti's  4. Depression with anxiety: she is emotionally stable at this time; will continue her lexapro 20 mg daily; will continue her risperdal 0.5 mg twice daily to help with her mood state. At this time I do not see a reason to increase her dosage of this medication. She is  able to be awake to answer some questions; there are no reports of behavioral issues present. She may benefit from lowering her risperdal in the future if she continues to do so well.   5. Insomnia: will continue trazodone 50 mg nightly and will add trazodone 25 mg as needed and will monitor   6. Dementia: is without change will continue aricept 20 mg nightly and will monitor  7. Constipation: will continue senna daily  8. COPD: is stable is on chronic 02; will not make changes and will monitor her status.   9. DDD cervical spine: her pain is presently being managed; will continue her tylenol 650 mg in the am with percocet 10/325 mg at hs and every 4 hours as needed and will monitor her status.      Ok Edwards NP [facility]  Contact [phone] Monday through Friday 8am- 5pm  After hours call [phone]

## 2014-09-28 DIAGNOSIS — J449 Chronic obstructive pulmonary disease, unspecified: Secondary | ICD-10-CM | POA: Insufficient documentation

## 2014-10-04 ENCOUNTER — Ambulatory Visit: Payer: Self-pay | Admitting: Internal Medicine

## 2014-10-04 LAB — URINALYSIS, COMPLETE
BILIRUBIN, UR: NEGATIVE
BLOOD: NEGATIVE
Glucose,UR: NEGATIVE mg/dL (ref 0–75)
KETONE: NEGATIVE
Nitrite: NEGATIVE
Ph: 5 (ref 4.5–8.0)
Protein: 100
RBC,UR: 15 /HPF (ref 0–5)
Specific Gravity: 1.019 (ref 1.003–1.030)
Squamous Epithelial: 8
WBC UR: 2129 /HPF (ref 0–5)

## 2014-10-07 LAB — URINE CULTURE

## 2014-10-10 ENCOUNTER — Encounter: Payer: Self-pay | Admitting: Registered Nurse

## 2014-10-10 ENCOUNTER — Non-Acute Institutional Stay (SKILLED_NURSING_FACILITY): Payer: Medicare Other | Admitting: Registered Nurse

## 2014-10-10 DIAGNOSIS — K5900 Constipation, unspecified: Secondary | ICD-10-CM

## 2014-10-10 DIAGNOSIS — F0390 Unspecified dementia without behavioral disturbance: Secondary | ICD-10-CM

## 2014-10-10 DIAGNOSIS — M810 Age-related osteoporosis without current pathological fracture: Secondary | ICD-10-CM

## 2014-10-10 DIAGNOSIS — G4700 Insomnia, unspecified: Secondary | ICD-10-CM

## 2014-10-10 DIAGNOSIS — M47812 Spondylosis without myelopathy or radiculopathy, cervical region: Secondary | ICD-10-CM

## 2014-10-10 DIAGNOSIS — F418 Other specified anxiety disorders: Secondary | ICD-10-CM

## 2014-10-10 DIAGNOSIS — R32 Unspecified urinary incontinence: Secondary | ICD-10-CM

## 2014-10-10 DIAGNOSIS — N3000 Acute cystitis without hematuria: Secondary | ICD-10-CM

## 2014-10-10 DIAGNOSIS — I10 Essential (primary) hypertension: Secondary | ICD-10-CM

## 2014-10-10 DIAGNOSIS — G40909 Epilepsy, unspecified, not intractable, without status epilepticus: Secondary | ICD-10-CM

## 2014-10-10 DIAGNOSIS — J449 Chronic obstructive pulmonary disease, unspecified: Secondary | ICD-10-CM

## 2014-10-10 NOTE — Progress Notes (Signed)
Patient ID: Linda [name], female   DOB: Nov 02, 1930, 80 y.o.   MRN: [id number]   Place of Service: [facility]  Allergies  Allergen Reactions  . Atorvastatin Other (See Comments)    REACTION: increased CPK  . Cephalexin Nausea Only and Other (See Comments)    Weak, Headache, has tolerated ceftriaxone  . Cetirizine Hcl Other (See Comments)    REACTION: makes her head feel big  . Cortisone Other (See Comments)    REACTION: increased bp  . Ezetimibe-Simvastatin Other (See Comments)    unknown  . Fentanyl Other (See Comments)    REACTION: stinging in her skin  . Guaifenesin Other (See Comments)    unknown  . Ketorolac Tromethamine     REACTION: no work  . Lisinopril Cough    ACE related  . Metformin Other (See Comments)    epistaxis  . Nsaids Other (See Comments)    epistaxis  . Penicillins Nausea Only and Other (See Comments)    Headache, has tolerated Zosyn  . Rofecoxib Itching  . Rosuvastatin Other (See Comments)    Myalgias  . Simvastatin Other (See Comments)    unknown  . Spironolactone Nausea Only  . Sulfamethoxazole-Trimethoprim Other (See Comments)    unknown  . Codeine Rash  . Tape Rash    Code Status: DNR  Goals of Care: Comfort and Quality of Life/LTC  Chief Complaint  Patient presents with  . Medical Management of Chronic Issues    demenita, seizure disorder, UI, deression, insomnia   . Acute Visit    UTI    HPI 80 y.o. female with PMH of dementia, seizure disorder, UI, depression, osteoporosis among many others is being seen for a routine visit for management of her chronic issues. Weight stable. No recent fall or skin concerns reported. No change in behaviors or functional status reported. No concerns from staff. Her recent UA with C&S came back positive for UTI (proteus mirabilis). Per daughter who is at bedside, patient has been complaining of dysuria and urinary frequency.   Review of Systems Unable to obtain but she's denying any pain  or discomfort.   Past Medical History  Diagnosis Date  . Allergy     allergic rhinitis  . Diabetes mellitus     type II  . Hypertension   . Osteopenia   . TIA (transient ischemic attack)   . Contact dermatitis     on buttocks  . Nosebleed   . Anxiety   . Renal insufficiency   . Pyelonephritis   . Dementia   . Chiari malformation   . Seizure disorder   . ROTATOR CUFF TEAR 10/13/2007    Qualifier: Diagnosis of  By: Marcelino Scot CMA, [place] Bilberry      Past Surgical History  Procedure Laterality Date  . Rotator cuff repair  06/2007    right  . Abdominal hysterectomy    . Back surgery  03/1999    disk  . Breast surgery      breast biopsy x2 negative  . Shoulder surgery  2007    left  . Joint replacement      Rt knee patella replacement  . Colonoscopy  08/28/2012    Procedure: COLONOSCOPY;  Surgeon: Beryle Beams, MD;  Location: [facility] ENDOSCOPY;  Service: Endoscopy;  Laterality: N/A;    History   Social History  . Marital Status: Single    Spouse Name: N/A    Number of Children: N/A  . Years of Education:  N/A   Occupational History  . Not on file.   Social History Main Topics  . Smoking status: Never Smoker   . Smokeless tobacco: Never Used  . Alcohol Use: No  . Drug Use: No  . Sexual Activity: No   Other Topics Concern  . Not on file   Social History Narrative   Divorced.  Lives with her daughter, [name].  She has two other daughters.  Ambulates with a walker at baseline.      Medication List       This list is accurate as of: 10/10/14  5:27 PM.  Always use your most recent med list.               acetaminophen 325 MG tablet  Commonly known as:  TYLENOL  Take 2 tablets (650 mg total) by mouth daily.     alendronate 70 MG tablet  Commonly known as:  FOSAMAX  Take 70 mg by mouth every 7 (seven) days. Saturday. Take with a full glass of water on an empty stomach.     alum & mag hydroxide-simeth 200-200-20 MG/5ML suspension  Commonly known as:   MAALOX/MYLANTA  Take 30 mLs by mouth every 6 (six) hours as needed for indigestion or heartburn (dyspepsia).     Cranberry 250 MG Tabs  Take 1 tablet (250 mg total) by mouth daily.     donepezil 10 MG tablet  Commonly known as:  ARICEPT  Take 20 mg by mouth at bedtime.     escitalopram 20 MG tablet  Commonly known as:  LEXAPRO  Take 1 tablet (20 mg total) by mouth daily. For nerves     feeding supplement (GLUCERNA SHAKE) Liqd  Take 237 mLs by mouth 3 (three) times daily between meals.     levETIRAcetam 500 MG tablet  Commonly known as:  KEPPRA  Take 1 tablet (500 mg total) by mouth 2 (two) times daily.     MYRBETRIQ 25 MG Tb24 tablet  Generic drug:  mirabegron ER  Take 25 mg by mouth daily.     nystatin powder  Commonly known as:  MYCOSTATIN  Apply 1 g topically daily. Apply under breasts and under stomach fold.     ondansetron 4 MG tablet  Commonly known as:  ZOFRAN  Take 4 mg by mouth every 8 (eight) hours as needed for nausea or vomiting (nausea).     oxyCODONE-acetaminophen 10-325 MG per tablet  Commonly known as:  PERCOCET  Take one tablet by mouth every 4 hours as needed for neck,back,shoulder and knee pain; Take one tablet by mouth every night at bedtime     risperiDONE 0.5 MG tablet  Commonly known as:  RISPERDAL  Take 1 tablet (0.5 mg total) by mouth 2 (two) times daily as needed.     senna 8.6 MG Tabs tablet  Commonly known as:  SENOKOT  Take 1 tablet by mouth daily as needed for mild constipation (constipation).        Physical Exam Filed Vitals:   10/10/14 1711  BP: 153/80  Pulse: 70  Temp: 97 F (36.1 C)  Resp: 18   Constitutional: Obese elderly female in no acute distress. Appears comfortable in chair. HEENT: Normocephalic and atraumatic. PERRL. EOM intact. No icterus.  Posterior pharynx clear of any exudate or lesions.  Neck: Supple and nontender. No lymphadenopathy, masses, or thyromegaly. No JVD or carotid bruits. Cardiac: Normal S1, S2.  RRR without appreciable murmurs, rubs, or gallops. Distal pulses intact. 1+ pitting  edema of BLE  Lungs: No respiratory distress. Breath sounds clear bilaterally without rales, rhonchi, or wheezes. On continuous O2 at 3L via [place].  Abdomen: Audible bowel sounds in all quadrants. Soft, nontender, nondistended.  Musculoskeletal: Able to move all extremities  Skin: Warm and dry. No rash noted. Neurological: Alert  Psychiatric: Appropriate mood and affect.   Labs Reviewed  CBC Latest Ref Rng 08/22/2014 08/21/2014 07/10/2014  WBC 4.0 - 10.5 K/uL 6.6 10.9(H) 7.6  Hemoglobin 12.0 - 15.0 g/dL 10.5(L) 11.1(L) 11.9(L)  Hematocrit 36.0 - 46.0 % 32.7(L) 33.8(L) 37.3  Platelets 150 - 400 K/uL 209 227 221    CMP Latest Ref Rng 09/21/2014 08/22/2014 08/21/2014  Glucose 70 - 99 mg/dL - 137(H) 117(H)  BUN 4 - 21 mg/dL 28(A) 24(H) 29(H)  Creatinine 0.5 - 1.1 mg/dL 1.3(A) 1.18(H) 1.61(H)  Sodium 137 - 147 mmol/L 137 143 142  Potassium 3.4 - 5.3 mmol/L 4.6 4.4 5.0  Chloride 96 - 112 mEq/L - 104 104  CO2 19 - 32 mEq/L - 26 28  Calcium 8.4 - 10.5 mg/dL - 9.1 9.6  Total Protein 6.0 - 8.3 g/dL - - 6.4  Total Bilirubin 0.3 - 1.2 mg/dL - - 0.3  Alkaline Phos 39 - 117 U/L - - 45  AST 0 - 37 U/L - - 13  ALT 0 - 35 U/L - - 9   Assessment & Plan 1. Essential hypertension BP elevated today. Currently not on BP meds. Will continue to monitor for now. If BP remains consistently above 150/90, will consider medication.   2. COPD, mild Stable. Continue continuous oxygen therapy at 3L via [place] and monitor.   3. Constipation, unspecified constipation type Stable. Continue senna 8.6mg  daily and monitor.   4. Seizure disorder No seizure as of late. Continue keppra 500mg  twice daily and monitor.   5. Dementia without behavioral disturbance Stable with decline anticipated. Continue aricept 20mg  daily and monitor for change in behaviors. Continue fall risk and pressure ulcer prophylaxis.   6. Urinary incontinence,  unspecified incontinence type Stable. Continue myrbetriq 25mg  daily and monitor.   7. Depression with anxiety Stable. Continue lexapro 20mg  daily and risperdal 0.5mg  twice daily for now. Continue to monitor for change in mood  8. Acute cystitis without hematuria Symptomatic. Start Rocephin 1g IM daily x 7 days with florastor 250mg  twice daily x 2 weeks. Continue cranberry daily to help prevent UTIs. Encourage increasing fluid intake. Continue to monitor  9. Osteoporosis Stable. Continue fosamax 70mg  weekly and monitor.   10. Insomnia Stable. Continue trazodone 50mg  daily at bedtime and additional 25mg  daily as needed. Continue to monitor.   11. DJD (degenerative joint disease) of cervical spine Continue apap 650mg  daily in the morning, percocet 10mg /325mg  daily at bedtime and Q4H as needed. Continue to monitor.     Family/Staff Communication Plan of care discussed with daughter and nursing staff. Daughter and nursing staff verbalized understanding and agree with plan of care. No additional questions or concerns reported.    Arthur Holms, MSN, AGNP-C [facility] [address] [place], [place] [address] [phone] [8am-5pm] After hours: [phone]

## 2014-10-18 ENCOUNTER — Encounter: Payer: Self-pay | Admitting: Registered Nurse

## 2014-10-18 ENCOUNTER — Non-Acute Institutional Stay (SKILLED_NURSING_FACILITY): Payer: Medicare Other | Admitting: Registered Nurse

## 2014-10-18 DIAGNOSIS — R635 Abnormal weight gain: Secondary | ICD-10-CM

## 2014-10-18 NOTE — Progress Notes (Signed)
Patient ID: Linda [name], female   DOB: 01-16-1930, 80 y.o.   MRN: [id number]   Place of Service: [facility]  Allergies  Allergen Reactions  . Atorvastatin Other (See Comments)    REACTION: increased CPK  . Cephalexin Nausea Only and Other (See Comments)    Weak, Headache, has tolerated ceftriaxone  . Cetirizine Hcl Other (See Comments)    REACTION: makes her head feel big  . Cortisone Other (See Comments)    REACTION: increased bp  . Ezetimibe-Simvastatin Other (See Comments)    unknown  . Fentanyl Other (See Comments)    REACTION: stinging in her skin  . Guaifenesin Other (See Comments)    unknown  . Ketorolac Tromethamine     REACTION: no work  . Lisinopril Cough    ACE related  . Metformin Other (See Comments)    epistaxis  . Nsaids Other (See Comments)    epistaxis  . Penicillins Nausea Only and Other (See Comments)    Headache, has tolerated Zosyn  . Rofecoxib Itching  . Rosuvastatin Other (See Comments)    Myalgias  . Simvastatin Other (See Comments)    unknown  . Spironolactone Nausea Only  . Sulfamethoxazole-Trimethoprim Other (See Comments)    unknown  . Codeine Rash  . Tape Rash    Code Status: DNR  Goals of Care: Comfort and Quality of Life/LTC  Chief Complaint  Patient presents with  . Acute Visit    weight gain    HPI 80 y.o. female with PMH of dementia, seizure disorder, UI, depression, osteoporosis among many others is being seen for an acute visit for ~10 lbs weight gain in one day. Patient denies any shortness of breath or chest pain. Full ROS limited secondary to dementia. Patient stated "feel okay". No change in appetite per nursing staff.    Past Medical History  Diagnosis Date  . Allergy     allergic rhinitis  . Diabetes mellitus     type II  . Hypertension   . Osteopenia   . TIA (transient ischemic attack)   . Contact dermatitis     on buttocks  . Nosebleed   . Anxiety   . Renal insufficiency   . Pyelonephritis    . Dementia   . Chiari malformation   . Seizure disorder   . ROTATOR CUFF TEAR 10/13/2007    Qualifier: Diagnosis of  By: Marcelino Scot CMA, [place] Bilberry      Past Surgical History  Procedure Laterality Date  . Rotator cuff repair  06/2007    right  . Abdominal hysterectomy    . Back surgery  03/1999    disk  . Breast surgery      breast biopsy x2 negative  . Shoulder surgery  2007    left  . Joint replacement      Rt knee patella replacement  . Colonoscopy  08/28/2012    Procedure: COLONOSCOPY;  Surgeon: Beryle Beams, MD;  Location: [facility] ENDOSCOPY;  Service: Endoscopy;  Laterality: N/A;    History   Social History  . Marital Status: Single    Spouse Name: N/A    Number of Children: N/A  . Years of Education: N/A   Occupational History  . Not on file.   Social History Main Topics  . Smoking status: Never Smoker   . Smokeless tobacco: Never Used  . Alcohol Use: No  . Drug Use: No  . Sexual Activity: No   Other Topics Concern  .  Not on file   Social History Narrative   Divorced.  Lives with her daughter, [name].  She has two other daughters.  Ambulates with a walker at baseline.      Medication List       This list is accurate as of: 10/18/14  5:03 PM.  Always use your most recent med list.               acetaminophen 325 MG tablet  Commonly known as:  TYLENOL  Take 2 tablets (650 mg total) by mouth daily.     alendronate 70 MG tablet  Commonly known as:  FOSAMAX  Take 70 mg by mouth every 7 (seven) days. Saturday. Take with a full glass of water on an empty stomach.     alum & mag hydroxide-simeth 200-200-20 MG/5ML suspension  Commonly known as:  MAALOX/MYLANTA  Take 30 mLs by mouth every 6 (six) hours as needed for indigestion or heartburn (dyspepsia).     Cranberry 250 MG Tabs  Take 1 tablet (250 mg total) by mouth daily.     donepezil 10 MG tablet  Commonly known as:  ARICEPT  Take 20 mg by mouth at bedtime.     escitalopram 20 MG tablet    Commonly known as:  LEXAPRO  Take 1 tablet (20 mg total) by mouth daily. For nerves     feeding supplement (GLUCERNA SHAKE) Liqd  Take 237 mLs by mouth 3 (three) times daily between meals.     levETIRAcetam 500 MG tablet  Commonly known as:  KEPPRA  Take 1 tablet (500 mg total) by mouth 2 (two) times daily.     MYRBETRIQ 25 MG Tb24 tablet  Generic drug:  mirabegron ER  Take 25 mg by mouth daily.     nystatin powder  Commonly known as:  MYCOSTATIN  Apply 1 g topically daily. Apply under breasts and under stomach fold.     ondansetron 4 MG tablet  Commonly known as:  ZOFRAN  Take 4 mg by mouth every 8 (eight) hours as needed for nausea or vomiting (nausea).     oxyCODONE-acetaminophen 10-325 MG per tablet  Commonly known as:  PERCOCET  Take one tablet by mouth every 4 hours as needed for neck,back,shoulder and knee pain; Take one tablet by mouth every night at bedtime     risperiDONE 0.5 MG tablet  Commonly known as:  RISPERDAL  Take 1 tablet (0.5 mg total) by mouth 2 (two) times daily as needed.     senna 8.6 MG Tabs tablet  Commonly known as:  SENOKOT  Take 1 tablet by mouth daily as needed for mild constipation (constipation).        Physical Exam  BP 121/71 mmHg  Pulse 84  Temp(Src) 98.1 F (36.7 C)  Resp 18  Ht 5\' 5"  (1.651 m)  Wt 209 lb 3.2 oz (94.892 kg)  BMI 34.81 kg/m2   Constitutional: Obese elderly female in no acute distress. Asleep in geri chair. HEENT: Normocephalic and atraumatic. PERRL. EOM intact. No icterus.  Posterior pharynx clear of any exudate or lesions.  Neck: Supple and nontender. No lymphadenopathy, masses, or thyromegaly. No JVD or carotid bruits. Cardiac: Normal S1, S2. RRR without appreciable murmurs, rubs, or gallops. Distal pulses intact. 1+ pitting edema of BLE  Lungs: No respiratory distress. Breath sounds clear bilaterally without rales, rhonchi, or wheezes. On continuous O2 at 3L via [place].  Abdomen: Audible bowel sounds in all  quadrants. Soft, nontender, nondistended.  Musculoskeletal:  Able to move all extremities  Skin: Warm and dry. Dry dressing on RUE intact (has a skin tear) Neurological: Arousable Psychiatric: Appropriate mood and affect.   Labs Reviewed  CBC Latest Ref Rng 08/22/2014 08/21/2014 07/10/2014  WBC 4.0 - 10.5 K/uL 6.6 10.9(H) 7.6  Hemoglobin 12.0 - 15.0 g/dL 10.5(L) 11.1(L) 11.9(L)  Hematocrit 36.0 - 46.0 % 32.7(L) 33.8(L) 37.3  Platelets 150 - 400 K/uL 209 227 221    CMP Latest Ref Rng 09/21/2014 08/22/2014 08/21/2014  Glucose 70 - 99 mg/dL - 137(H) 117(H)  BUN 4 - 21 mg/dL 28(A) 24(H) 29(H)  Creatinine 0.5 - 1.1 mg/dL 1.3(A) 1.18(H) 1.61(H)  Sodium 137 - 147 mmol/L 137 143 142  Potassium 3.4 - 5.3 mmol/L 4.6 4.4 5.0  Chloride 96 - 112 mEq/L - 104 104  CO2 19 - 32 mEq/L - 26 28  Calcium 8.4 - 10.5 mg/dL - 9.1 9.6  Total Protein 6.0 - 8.3 g/dL - - 6.4  Total Bilirubin 0.3 - 1.2 mg/dL - - 0.3  Alkaline Phos 39 - 117 U/L - - 45  AST 0 - 37 U/L - - 13  ALT 0 - 35 U/L - - 9   Assessment & Plan 1. Weight gain There has been some discrepancy with the scale. However, the patient have gained about 8 lbs since the last time I saw her on 10/10/14. Euvolemic on exam. Will check BNP and proBNP and continue to monitor now.   Labs Ordered: BNP and pro BNP  Family/Staff Communication Plan of care discussed with daughter and nursing staff. Daughter and nursing staff verbalized understanding and agree with plan of care. No additional questions or concerns reported.    Arthur Holms, MSN, AGNP-C [facility] [address] [place], [place] [address] [phone] [8am-5pm] After hours: [phone]

## 2014-11-13 ENCOUNTER — Non-Acute Institutional Stay (SKILLED_NURSING_FACILITY): Payer: Medicare Other | Admitting: Registered Nurse

## 2014-11-13 DIAGNOSIS — G4700 Insomnia, unspecified: Secondary | ICD-10-CM

## 2014-11-13 DIAGNOSIS — R32 Unspecified urinary incontinence: Secondary | ICD-10-CM

## 2014-11-13 DIAGNOSIS — F418 Other specified anxiety disorders: Secondary | ICD-10-CM

## 2014-11-13 DIAGNOSIS — J449 Chronic obstructive pulmonary disease, unspecified: Secondary | ICD-10-CM

## 2014-11-13 DIAGNOSIS — L8992 Pressure ulcer of unspecified site, stage 2: Secondary | ICD-10-CM

## 2014-11-13 DIAGNOSIS — F0390 Unspecified dementia without behavioral disturbance: Secondary | ICD-10-CM

## 2014-11-13 DIAGNOSIS — M47812 Spondylosis without myelopathy or radiculopathy, cervical region: Secondary | ICD-10-CM

## 2014-11-13 DIAGNOSIS — K5900 Constipation, unspecified: Secondary | ICD-10-CM

## 2014-11-13 DIAGNOSIS — M810 Age-related osteoporosis without current pathological fracture: Secondary | ICD-10-CM

## 2014-11-13 DIAGNOSIS — G40909 Epilepsy, unspecified, not intractable, without status epilepticus: Secondary | ICD-10-CM

## 2014-11-14 ENCOUNTER — Encounter: Payer: Self-pay | Admitting: Registered Nurse

## 2014-11-14 NOTE — Progress Notes (Signed)
Patient ID: Linda [name], female   DOB: 09-19-1930, 81 y.o.   MRN: [id number]   Place of Service: [facility]  Allergies  Allergen Reactions  . Atorvastatin Other (See Comments)    REACTION: increased CPK  . Cephalexin Nausea Only and Other (See Comments)    Weak, Headache, has tolerated ceftriaxone  . Cetirizine Hcl Other (See Comments)    REACTION: makes her head feel big  . Cortisone Other (See Comments)    REACTION: increased bp  . Ezetimibe-Simvastatin Other (See Comments)    unknown  . Fentanyl Other (See Comments)    REACTION: stinging in her skin  . Guaifenesin Other (See Comments)    unknown  . Ketorolac Tromethamine     REACTION: no work  . Lisinopril Cough    ACE related  . Metformin Other (See Comments)    epistaxis  . Nsaids Other (See Comments)    epistaxis  . Penicillins Nausea Only and Other (See Comments)    Headache, has tolerated Zosyn  . Rofecoxib Itching  . Rosuvastatin Other (See Comments)    Myalgias  . Simvastatin Other (See Comments)    unknown  . Spironolactone Nausea Only  . Sulfamethoxazole-Trimethoprim Other (See Comments)    unknown  . Codeine Rash  . Tape Rash    Code Status: DNR  Goals of Care: Comfort and Quality of Life/LTC  Chief Complaint  Patient presents with  . Medical Management of Chronic Issues    seizure disorder, dementia, UI, osteoporosis, DJD, insominia, COPD    HPI 81 y.o. female with PMH of dementia, seizure disorder, UI, depression, osteoporosis among many others is being seen for a routine visit for management of her chronic issues. Weight remains stable since last visit. No recent fall reported. She has a stage 2 coccyx pressure ulcer. No change in behaviors or functional status reported. Per staff, patient still has urinary frequency, but that is her baseline at home per daughter. She remains seizure free on keppra. Dementia is currently stable on aricept. UI is stable on myrbetriq. Ostoporosis is  stable on fosamax. Depression is stable on lexapro. No recent COPD exacerbations-stable on continuous O2 at 3lpm via nasal cannula.   Review of Systems Unable to obtain due to dementia but she's denying any pain or discomfort.   Past Medical History  Diagnosis Date  . Allergy     allergic rhinitis  . Diabetes mellitus     type II  . Hypertension   . Osteopenia   . TIA (transient ischemic attack)   . Contact dermatitis     on buttocks  . Nosebleed   . Anxiety   . Renal insufficiency   . Pyelonephritis   . Dementia   . Chiari malformation   . Seizure disorder   . ROTATOR CUFF TEAR 10/13/2007    Qualifier: Diagnosis of  By: Marcelino Scot CMA, [place] Bilberry      Past Surgical History  Procedure Laterality Date  . Rotator cuff repair  06/2007    right  . Abdominal hysterectomy    . Back surgery  03/1999    disk  . Breast surgery      breast biopsy x2 negative  . Shoulder surgery  2007    left  . Joint replacement      Rt knee patella replacement  . Colonoscopy  08/28/2012    Procedure: COLONOSCOPY;  Surgeon: Beryle Beams, MD;  Location: [facility] ENDOSCOPY;  Service: Endoscopy;  Laterality: N/A;  History   Social History  . Marital Status: Single    Spouse Name: N/A    Number of Children: N/A  . Years of Education: N/A   Occupational History  . Not on file.   Social History Main Topics  . Smoking status: Never Smoker   . Smokeless tobacco: Never Used  . Alcohol Use: No  . Drug Use: No  . Sexual Activity: No   Other Topics Concern  . Not on file   Social History Narrative   Divorced.  Lives with her daughter, [name].  She has two other daughters.  Ambulates with a walker at baseline.      Medication List       This list is accurate as of: 11/13/14 11:59 PM.  Always use your most recent med list.               acetaminophen 325 MG tablet  Commonly known as:  TYLENOL  Take 2 tablets (650 mg total) by mouth daily.     alendronate 70 MG tablet  Commonly  known as:  FOSAMAX  Take 70 mg by mouth every 7 (seven) days. Saturday. Take with a full glass of water on an empty stomach.     alum & mag hydroxide-simeth 200-200-20 MG/5ML suspension  Commonly known as:  MAALOX/MYLANTA  Take 30 mLs by mouth every 6 (six) hours as needed for indigestion or heartburn (dyspepsia).     Cranberry 250 MG Tabs  Take 1 tablet (250 mg total) by mouth daily.     donepezil 10 MG tablet  Commonly known as:  ARICEPT  Take 20 mg by mouth at bedtime.     escitalopram 20 MG tablet  Commonly known as:  LEXAPRO  Take 1 tablet (20 mg total) by mouth daily. For nerves     feeding supplement (GLUCERNA SHAKE) Liqd  Take 237 mLs by mouth 3 (three) times daily between meals.     levETIRAcetam 500 MG tablet  Commonly known as:  KEPPRA  Take 1 tablet (500 mg total) by mouth 2 (two) times daily.     MYRBETRIQ 25 MG Tb24 tablet  Generic drug:  mirabegron ER  Take 25 mg by mouth daily.     nystatin powder  Commonly known as:  MYCOSTATIN  Apply 1 g topically daily. Apply under breasts and under stomach fold.     ondansetron 4 MG tablet  Commonly known as:  ZOFRAN  Take 4 mg by mouth every 8 (eight) hours as needed for nausea or vomiting (nausea).     oxyCODONE-acetaminophen 10-325 MG per tablet  Commonly known as:  PERCOCET  Take one tablet by mouth every 4 hours as needed for neck,back,shoulder and knee pain; Take one tablet by mouth every night at bedtime     risperiDONE 0.5 MG tablet  Commonly known as:  RISPERDAL  Take 1 tablet (0.5 mg total) by mouth 2 (two) times daily as needed.     senna 8.6 MG Tabs tablet  Commonly known as:  SENOKOT  Take 1 tablet by mouth daily as needed for mild constipation (constipation).        Physical Exam  BP 132/70 mmHg  Pulse 88  Temp(Src) 98.1 F (36.7 C)  Resp 18  Ht 5\' 5"  (1.651 m)  Wt 209 lb 4.8 oz (94.938 kg)  BMI 34.83 kg/m2  SpO2 95%   Constitutional: Obese elderly female in no acute distress. Appears  comfortable in chair. HEENT: Normocephalic and atraumatic. PERRL. EOM  intact. No icterus.  Posterior pharynx clear of any exudate or lesions.  Neck: Supple and nontender. No lymphadenopathy, masses, or thyromegaly. No JVD or carotid bruits. Cardiac: Normal S1, S2. RRR without appreciable murmurs, rubs, or gallops. Distal pulses intact. No dependent edema noted  Lungs: No respiratory distress. Breath sounds clear bilaterally without rales, rhonchi, or wheezes. On continuous O2 at 3L via [place].  Abdomen: Audible bowel sounds in all quadrants. Soft, nontender, nondistended.  Musculoskeletal: Able to move all extremities  Skin: Warm and dry. No rash noted. 1.2x2x0.2 cm stage 2 pressure ulcer of coccyx.  Neurological: Alert  Psychiatric: Appropriate mood and affect.   Labs Reviewed  CBC Latest Ref Rng 08/22/2014 08/21/2014 07/10/2014  WBC 4.0 - 10.5 K/uL 6.6 10.9(H) 7.6  Hemoglobin 12.0 - 15.0 g/dL 10.5(L) 11.1(L) 11.9(L)  Hematocrit 36.0 - 46.0 % 32.7(L) 33.8(L) 37.3  Platelets 150 - 400 K/uL 209 227 221    CMP Latest Ref Rng 09/21/2014 08/22/2014 08/21/2014  Glucose 70 - 99 mg/dL - 137(H) 117(H)  BUN 4 - 21 mg/dL 28(A) 24(H) 29(H)  Creatinine 0.5 - 1.1 mg/dL 1.3(A) 1.18(H) 1.61(H)  Sodium 137 - 147 mmol/L 137 143 142  Potassium 3.4 - 5.3 mmol/L 4.6 4.4 5.0  Chloride 96 - 112 mEq/L - 104 104  CO2 19 - 32 mEq/L - 26 28  Calcium 8.4 - 10.5 mg/dL - 9.1 9.6  Total Protein 6.0 - 8.3 g/dL - - 6.4  Total Bilirubin 0.3 - 1.2 mg/dL - - 0.3  Alkaline Phos 39 - 117 U/L - - 45  AST 0 - 37 U/L - - 13  ALT 0 - 35 U/L - - 9   Assessment & Plan 1. Stage 2 pressure ulcer of coccyx Continue flexicol dressing, gel mattress, frequent repositioning/offloading. Continue pressure ulcer precautions and monitor  2. COPD, mild Stable. Continue continuous oxygen therapy at 3L via [place] and monitor.   3. Constipation, unspecified constipation type Stable. Continue senna 8.6mg  daily and monitor.   4. Seizure  disorder Remains seizure free. Continue keppra 500mg  twice daily and monitor.   5. Dementia without behavioral disturbance Stable with decline anticipated. Continue aricept 20mg  daily and monitor for change in behaviors. Continue fall risk and pressure ulcer precautions  6. Urinary incontinence, unspecified incontinence type Stable. Continue myrbetriq 25mg  daily and monitor.   7. Depression with anxiety Stable. Continue lexapro 20mg  daily, risperdal 0.5mg  twice daily, and monitor for change in mood  8. Osteoporosis Stable. Continue fosamax 70mg  weekly and monitor. Fall risks precautions  9. Insomnia Stable. Continue trazodone 50mg  daily at bedtime and additional 25mg  daily as needed. Continue to monitor.   10. DJD (degenerative joint disease) of cervical spine Continue apap 650mg  daily in the morning, percocet 10mg /325mg  daily at bedtime and Q4H as needed. Continue to monitor.     Family/Staff Communication Plan of care discussed with nursing staff. Nursing staff verbalized understanding and agree with plan of care. No additional questions or concerns reported.    Arthur Holms, MSN, AGNP-C [facility] [address] [place], [place] [address] [phone] [8am-5pm] After hours: [phone]

## 2014-11-25 ENCOUNTER — Emergency Department (HOSPITAL_COMMUNITY)
Admission: EM | Admit: 2014-11-25 | Discharge: 2014-11-25 | Disposition: A | Discharge status: Home / Self Care | Payer: Medicare Other | Attending: Emergency Medicine | Admitting: Emergency Medicine

## 2014-11-25 ENCOUNTER — Encounter (HOSPITAL_COMMUNITY): Payer: Self-pay | Admitting: Emergency Medicine

## 2014-11-25 ENCOUNTER — Emergency Department (HOSPITAL_COMMUNITY): Payer: Medicare Other

## 2014-11-25 DIAGNOSIS — R0789 Other chest pain: Secondary | ICD-10-CM | POA: Insufficient documentation

## 2014-11-25 DIAGNOSIS — Q0700 Arnold-Chiari syndrome without spina bifida or hydrocephalus: Secondary | ICD-10-CM | POA: Diagnosis not present

## 2014-11-25 DIAGNOSIS — R079 Chest pain, unspecified: Secondary | ICD-10-CM | POA: Diagnosis present

## 2014-11-25 DIAGNOSIS — Z880 Allergy status to penicillin: Secondary | ICD-10-CM | POA: Diagnosis not present

## 2014-11-25 DIAGNOSIS — Z8673 Personal history of transient ischemic attack (TIA), and cerebral infarction without residual deficits: Secondary | ICD-10-CM | POA: Insufficient documentation

## 2014-11-25 DIAGNOSIS — Z872 Personal history of diseases of the skin and subcutaneous tissue: Secondary | ICD-10-CM | POA: Diagnosis not present

## 2014-11-25 DIAGNOSIS — I10 Essential (primary) hypertension: Secondary | ICD-10-CM | POA: Diagnosis not present

## 2014-11-25 DIAGNOSIS — F0390 Unspecified dementia without behavioral disturbance: Secondary | ICD-10-CM | POA: Insufficient documentation

## 2014-11-25 DIAGNOSIS — Z79899 Other long term (current) drug therapy: Secondary | ICD-10-CM | POA: Diagnosis not present

## 2014-11-25 DIAGNOSIS — F419 Anxiety disorder, unspecified: Secondary | ICD-10-CM | POA: Insufficient documentation

## 2014-11-25 DIAGNOSIS — Z8739 Personal history of other diseases of the musculoskeletal system and connective tissue: Secondary | ICD-10-CM | POA: Diagnosis not present

## 2014-11-25 DIAGNOSIS — Z87448 Personal history of other diseases of urinary system: Secondary | ICD-10-CM | POA: Insufficient documentation

## 2014-11-25 DIAGNOSIS — Z87442 Personal history of urinary calculi: Secondary | ICD-10-CM | POA: Diagnosis not present

## 2014-11-25 DIAGNOSIS — G40909 Epilepsy, unspecified, not intractable, without status epilepticus: Secondary | ICD-10-CM | POA: Diagnosis not present

## 2014-11-25 DIAGNOSIS — E119 Type 2 diabetes mellitus without complications: Secondary | ICD-10-CM | POA: Insufficient documentation

## 2014-11-25 LAB — I-STAT TROPONIN, ED: TROPONIN I, POC: 0.02 ng/mL (ref 0.00–0.08)

## 2014-11-25 LAB — CBC
HCT: 29 % — ABNORMAL LOW (ref 36.0–46.0)
HEMOGLOBIN: 9.5 g/dL — AB (ref 12.0–15.0)
MCH: 29.7 pg (ref 26.0–34.0)
MCHC: 32.8 g/dL (ref 30.0–36.0)
MCV: 90.6 fL (ref 78.0–100.0)
PLATELETS: 280 10*3/uL (ref 150–400)
RBC: 3.2 MIL/uL — ABNORMAL LOW (ref 3.87–5.11)
RDW: 14 % (ref 11.5–15.5)
WBC: 10.7 10*3/uL — AB (ref 4.0–10.5)

## 2014-11-25 LAB — BASIC METABOLIC PANEL
Anion gap: 11 (ref 5–15)
BUN: 33 mg/dL — ABNORMAL HIGH (ref 6–23)
CALCIUM: 9.2 mg/dL (ref 8.4–10.5)
CHLORIDE: 108 meq/L (ref 96–112)
CO2: 25 mmol/L (ref 19–32)
Creatinine, Ser: 1.39 mg/dL — ABNORMAL HIGH (ref 0.50–1.10)
GFR calc Af Amer: 40 mL/min — ABNORMAL LOW (ref >90)
GFR calc non Af Amer: 34 mL/min — ABNORMAL LOW (ref >90)
Glucose, Bld: 145 mg/dL — ABNORMAL HIGH (ref 70–99)
POTASSIUM: 4.1 mmol/L (ref 3.5–5.1)
Sodium: 144 mmol/L (ref 135–145)

## 2014-11-25 MED ORDER — ONDANSETRON HCL 4 MG/2ML IJ SOLN
4.0000 mg | Freq: Once | INTRAMUSCULAR | Status: DC
Start: 2014-11-25 — End: 2014-11-25

## 2014-11-25 NOTE — ED Notes (Signed)
[facility] called for transport to [facility] 

## 2014-11-25 NOTE — Discharge Instructions (Signed)
Chest Wall Pain Chest wall pain is pain in or around the bones and muscles of your chest. It may take up to 6 weeks to get better. It may take longer if you must stay physically active in your work and activities.  CAUSES  Chest wall pain may happen on its own. However, it may be caused by:  A viral illness like the flu.  Injury.  Coughing.  Exercise.  Arthritis.  Fibromyalgia.  Shingles. HOME CARE INSTRUCTIONS   Avoid overtiring physical activity. Try not to strain or perform activities that cause pain. This includes any activities using your chest or your abdominal and side muscles, especially if heavy weights are used.  Put ice on the sore area.  Put ice in a plastic bag.  Place a towel between your skin and the bag.  Leave the ice on for 15-20 minutes per hour while awake for the first 2 days.  Only take over-the-counter or prescription medicines for pain, discomfort, or fever as directed by your caregiver. SEEK IMMEDIATE MEDICAL CARE IF:   Your pain increases, or you are very uncomfortable.  You have a fever.  Your chest pain becomes worse.  You have new, unexplained symptoms.  You have nausea or vomiting.  You feel sweaty or lightheaded.  You have a cough with phlegm (sputum), or you cough up blood. MAKE SURE YOU:   Understand these instructions.  Will watch your condition.  Will get help right away if you are not doing well or get worse. Document Released: 10/27/2005 Document Revised: 01/19/2012 Document Reviewed: 06/23/2011 [facility] Patient Information 2015 [place], [place]. This information is not intended to replace advice given to you by your health care provider. Make sure you discuss any questions you have with your health care provider.   Is important for you to follow up with your primary care for further evaluation and management of your symptoms. You may continue to take your pain medicines as prescribed by your on-site physician. Please return  to ED for worsening symptoms.

## 2014-11-25 NOTE — ED Provider Notes (Signed)
Patient seen/examined in the Emergency Department in conjunction with Midlevel Provider  Patient reports chest wall pain Exam : awake/alert, mild tenderness to chest wall Plan: imaging pending at this time.  At this point, daughters are leaning towards her going back to facility as they feel this is [facility] chest pain   EKG Interpretation  Date/Time:  Saturday November 25 2014 06:01:56 EST Ventricular Rate:  70 PR Interval:  142 QRS Duration: 82 QT Interval:  407 QTC Calculation: 439 R Axis:   20 Text Interpretation:  Sinus rhythm artifact noted ECG OTHERWISE WITHIN NORMAL LIMITS No significant change since last tracing Confirmed by Christy Gentles  MD, Nkenge Sonntag ([id number]) on 11/25/2014 6:08:23 AM        Sharyon Cable, MD 11/25/14 [phone]

## 2014-11-25 NOTE — ED Notes (Signed)
Per family pt was sitting at nurses desk and began having central cp that didn't radiate with sob. No nausea and no vomiting. Pt was given 3 Nitro at [facility] and 1 baby ASA. Pt on 2 L at facility at all times.

## 2014-11-25 NOTE — ED Provider Notes (Signed)
CSN: [id number]     Arrival date & time 11/25/14  0541 History   First MD Initiated Contact with Patient 11/25/14 [phone]     Chief Complaint  Patient presents with  . Chest Pain   Level V caveat: Dementia  (Consider location/radiation/quality/duration/timing/severity/associated sxs/prior Treatment) HPI Linda [name] is a 81 y.o. female with a history of dementia, hypertension, diabetes, seizure disorder comes in for evaluation of chest pain. She is accompanied by her daughters who give the history of present illness. Daughters report that they received a call from [facility] this morning at 4:30 AM informing them that patient reported chest pain. Received 3 nitroglycerin and one baby aspirin. Patient is normally on 2 L nasal cannula. They deny any nausea, vomiting, shortness of breath, radiating pain. They report patient does not have a cardiac history. Patient is irritable now, but daughters report this is baseline when she does not sleep at night. They report patient worked third shift throughout her career and will frequently stay up at night. Daughters think that patient may have pulled a muscle because she tries to pull herself out of the bed and out of her chair frequently. They report patient is otherwise stable on medications.   Past Medical History  Diagnosis Date  . Allergy     allergic rhinitis  . Diabetes mellitus     type II  . Hypertension   . Osteopenia   . TIA (transient ischemic attack)   . Contact dermatitis     on buttocks  . Nosebleed   . Anxiety   . Renal insufficiency   . Pyelonephritis   . Dementia   . Chiari malformation   . Seizure disorder   . ROTATOR CUFF TEAR 10/13/2007    Qualifier: Diagnosis of  By: Marcelino Scot CMA, [place] Bilberry     Past Surgical History  Procedure Laterality Date  . Rotator cuff repair  06/2007    right  . Abdominal hysterectomy    . Back surgery  03/1999    disk  . Breast surgery      breast biopsy x2 negative  . Shoulder surgery   2007    left  . Joint replacement      Rt knee patella replacement  . Colonoscopy  08/28/2012    Procedure: COLONOSCOPY;  Surgeon: Beryle Beams, MD;  Location: [facility] ENDOSCOPY;  Service: Endoscopy;  Laterality: N/A;   Family History  Problem Relation Age of Onset  . Stroke Mother   . Stroke Father    History  Substance Use Topics  . Smoking status: Never Smoker   . Smokeless tobacco: Never Used  . Alcohol Use: No   OB History    No data available     Review of Systems  Unable to perform ROS     Allergies  Atorvastatin; Cephalexin; Cetirizine hcl; Cortisone; Ezetimibe-simvastatin; Fentanyl; Guaifenesin; Ketorolac tromethamine; Lisinopril; Metformin; Nsaids; Penicillins; Rofecoxib; Rosuvastatin; Simvastatin; Spironolactone; Sulfamethoxazole-trimethoprim; Codeine; and Tape  Home Medications   Prior to Admission medications   Medication Sig Start Date End Date Taking? Authorizing Provider  acetaminophen (TYLENOL) 325 MG tablet Take 2 tablets (650 mg total) by mouth daily. 08/30/14  Yes Gerlene Fee, NP  alendronate (FOSAMAX) 70 MG tablet Take 70 mg by mouth every 7 (seven) days. Saturday. Take with a full glass of water on an empty stomach.   Yes Historical Provider, MD  alum & mag hydroxide-simeth (MAALOX/MYLANTA) 200-200-20 MG/5ML suspension Take 30 mLs by mouth every 6 (six) hours as  needed for indigestion or heartburn (dyspepsia). 08/24/14  Yes Robbie Lis, MD  Cranberry 250 MG TABS Take 1 tablet (250 mg total) by mouth daily. 08/25/14  Yes Gerlene Fee, NP  donepezil (ARICEPT) 10 MG tablet Take 20 mg by mouth at bedtime.    Yes Historical Provider, MD  escitalopram (LEXAPRO) 20 MG tablet Take 1 tablet (20 mg total) by mouth daily. For nerves 08/24/14  Yes Robbie Lis, MD  feeding supplement, GLUCERNA SHAKE, (GLUCERNA SHAKE) LIQD Take 237 mLs by mouth 3 (three) times daily between meals. 04/14/14  Yes Bonnielee Haff, MD  levETIRAcetam (KEPPRA) 500 MG tablet Take 1  tablet (500 mg total) by mouth 2 (two) times daily. 07/12/14  Yes Charlynne Cousins, MD  mirabegron ER (MYRBETRIQ) 25 MG TB24 tablet Take 25 mg by mouth daily.   Yes Historical Provider, MD  nystatin (MYCOSTATIN) powder Apply 1 g topically daily. Apply under breasts and under stomach fold.   Yes Historical Provider, MD  ondansetron (ZOFRAN) 4 MG tablet Take 4 mg by mouth every 8 (eight) hours as needed for nausea or vomiting (nausea).    Yes Historical Provider, MD  oxyCODONE-acetaminophen (PERCOCET) 10-325 MG per tablet Take one tablet by mouth every 4 hours as needed for neck,back,shoulder and knee pain; Take one tablet by mouth every night at bedtime 08/29/14  Yes Mahima Pandey, MD  risperiDONE (RISPERDAL) 0.5 MG tablet Take 1 tablet (0.5 mg total) by mouth 2 (two) times daily as needed. 09/05/14  Yes Gerlene Fee, NP  senna (SENOKOT) 8.6 MG TABS tablet Take 1 tablet by mouth daily as needed for mild constipation (constipation).    Yes Historical Provider, MD   BP 118/50 mmHg  Pulse 63  Temp(Src) 98 F (36.7 C) (Oral)  Resp 12  SpO2 98% Physical Exam  Constitutional: She is oriented to person, place, and time. She appears well-developed and well-nourished.  Dementia, aggravated  HENT:  Head: Normocephalic and atraumatic.  Mouth/Throat: Oropharynx is clear and moist.  Eyes: Conjunctivae are normal. Pupils are equal, round, and reactive to light. Right eye exhibits no discharge. Left eye exhibits no discharge. No scleral icterus.  Neck: Neck supple.  Cardiovascular: Normal rate, regular rhythm and normal heart sounds.   Pulmonary/Chest: Effort normal and breath sounds normal. No respiratory distress. She has no wheezes. She has no rales.  Diffuse chest tenderness with palpation. No bony step-offs or crepitus.  Abdominal: Soft. There is no tenderness.  Musculoskeletal: She exhibits no tenderness.  Neurological: She is alert and oriented to person, place, and time.  Cranial Nerves  II-XII grossly intact  Skin: Skin is warm and dry. No rash noted. She is not diaphoretic.  Psychiatric: She has a normal mood and affect.  Nursing note and vitals reviewed.   ED Course  Procedures (including critical care time) Labs Review Labs Reviewed  CBC - Abnormal; Notable for the following:    WBC 10.7 (*)    RBC 3.20 (*)    Hemoglobin 9.5 (*)    HCT 29.0 (*)    All other components within normal limits  BASIC METABOLIC PANEL - Abnormal; Notable for the following:    Glucose, Bld 145 (*)    BUN 33 (*)    Creatinine, Ser 1.39 (*)    GFR calc non Af Amer 34 (*)    GFR calc Af Amer 40 (*)    All other components within normal limits  I-STAT TROPOININ, ED    Imaging Review Dg  Chest Port 1 View  11/25/2014   CLINICAL DATA:  Chest pain since this morning.  EXAM: PORTABLE CHEST - 1 VIEW  COMPARISON:  08/21/2014.  FINDINGS: The heart remains grossly normal in size. Poor inspiration with minimal left basilar opacity. Right humeral head prosthesis. Thoracic spine degenerative changes.  IMPRESSION: Poor inspiration with minimal left basilar atelectasis. Early pneumonia cannot be excluded.   Electronically Signed   By: Enrique Sack M.D.   On: 11/25/2014 07:23     EKG Interpretation   Date/Time:  Saturday November 25 2014 06:01:56 EST Ventricular Rate:  70 PR Interval:  142 QRS Duration: 82 QT Interval:  407 QTC Calculation: 439 R Axis:   20 Text Interpretation:  Sinus rhythm artifact noted ECG OTHERWISE WITHIN  NORMAL LIMITS No significant change since last tracing Confirmed by  Christy Gentles  MD, DONALD ([id number]) on 11/25/2014 6:08:23 AM     Meds given in ED:  Medications - No data to display  New Prescriptions   No medications on file   Filed Vitals:   11/25/14 0730 11/25/14 0745 11/25/14 0800 11/25/14 0821  BP: 124/71 125/66 128/55 118/50  Pulse: 68 79 65 63  Temp:      TempSrc:      Resp: 14 16 12    SpO2: 96% 98% 98% 98%    MDM  Vitals stable  -afebrile Pt resting  comfortably in ED. Is at her baseline per family in the room. PE--tenderness to palpation of sternum. Likely muscle strain. No other evidence of fluid overload. Benign lung exam Labwork --troponin negative, EKG is reassuring and unchanged from previous. Labs are otherwise noncontributory Imaging--x-ray of chest shows no acute cardiopulmonary pathologies. There is basilar atelectasis but no overt pneumonia   DDX--discussed with family the likely diagnosis of musculoskeletal strain, they are in agreement and feel like the patient can return to her nursing facility. Patient has pain medicines at her nursing facility that she receives from the on-site physician. I discussed all relevant lab findings and imaging results with pt and they verbalized understanding. Discussed f/u with PCP within 48 hrs and return precautions, pt very amenable to plan.  Prior to patient discharge, I discussed and reviewed this case with Dr. Christy Gentles   Final diagnoses:  Chest wall pain        Verl Dicker, PA-C 11/25/14 [place], PA-C 11/27/14 Normal, MD 11/29/14 [phone]

## 2014-12-04 ENCOUNTER — Other Ambulatory Visit: Payer: Self-pay | Admitting: *Deleted

## 2014-12-05 ENCOUNTER — Encounter: Payer: Self-pay | Admitting: Registered Nurse

## 2014-12-05 ENCOUNTER — Non-Acute Institutional Stay (SKILLED_NURSING_FACILITY): Payer: Medicare Other | Admitting: Registered Nurse

## 2014-12-05 DIAGNOSIS — M549 Dorsalgia, unspecified: Secondary | ICD-10-CM

## 2014-12-05 NOTE — Progress Notes (Signed)
Patient ID: Linda [name], female   DOB: 1929/12/29, 81 y.o.   MRN: [id number]   Place of Service: [facility]  Allergies  Allergen Reactions  . Atorvastatin Other (See Comments)    REACTION: increased CPK  . Cephalexin Nausea Only and Other (See Comments)    Weak, Headache, has tolerated ceftriaxone  . Cetirizine Hcl Other (See Comments)    REACTION: makes her head feel big  . Cortisone Other (See Comments)    REACTION: increased bp  . Ezetimibe-Simvastatin Other (See Comments)    unknown  . Fentanyl Other (See Comments)    REACTION: stinging in her skin  . Guaifenesin Other (See Comments)    unknown  . Ketorolac Tromethamine     REACTION: no work  . Lisinopril Cough    ACE related  . Metformin Other (See Comments)    epistaxis  . Nsaids Other (See Comments)    epistaxis  . Penicillins Nausea Only and Other (See Comments)    Headache, has tolerated Zosyn  . Rofecoxib Itching  . Rosuvastatin Other (See Comments)    Myalgias  . Simvastatin Other (See Comments)    unknown  . Spironolactone Nausea Only  . Sulfamethoxazole-Trimethoprim Other (See Comments)    unknown  . Codeine Rash  . Tape Rash    Code Status: DNR  Goals of Care: Comfort and Quality of Life/LTC  Chief Complaint  Patient presents with  . Acute Visit    back pain/tightness    HPI 81 y.o. female with PMH of dementia, seizure disorder, COPD on continuous oxygen at 2-3L via [place], UI, depression, osteoporosis among many others is being seen for an acute visit at the request of daughter for back pain/strain/tightness. Per daughter, patient has had back pain on/off over the past few weeks. Would like to have her percocet 10/325mg  scheduled around the clock to help her rest. Patient was recently seen in the ED for central chest pain-thought to be musculoskeletal strain in nature as EKG, troponin, and cxr were negative. Seen in wheelchair today. Attempts to get up frequently.   Review of  Systems Unable to obtain due to dementia but able to voice "my back hurts".   Past Medical History  Diagnosis Date  . Allergy     allergic rhinitis  . Diabetes mellitus     type II  . Hypertension   . Osteopenia   . TIA (transient ischemic attack)   . Contact dermatitis     on buttocks  . Nosebleed   . Anxiety   . Renal insufficiency   . Pyelonephritis   . Dementia   . Chiari malformation   . Seizure disorder   . ROTATOR CUFF TEAR 10/13/2007    Qualifier: Diagnosis of  By: Marcelino Scot CMA, [place] Bilberry      Past Surgical History  Procedure Laterality Date  . Rotator cuff repair  06/2007    right  . Abdominal hysterectomy    . Back surgery  03/1999    disk  . Breast surgery      breast biopsy x2 negative  . Shoulder surgery  2007    left  . Joint replacement      Rt knee patella replacement  . Colonoscopy  08/28/2012    Procedure: COLONOSCOPY;  Surgeon: Beryle Beams, MD;  Location: [facility];  Service: Endoscopy;  Laterality: N/A;    History   Social History  . Marital Status: Single    Spouse Name: N/A  Number of Children: N/A  . Years of Education: N/A   Occupational History  . Not on file.   Social History Main Topics  . Smoking status: Never Smoker   . Smokeless tobacco: Never Used  . Alcohol Use: No  . Drug Use: No  . Sexual Activity: No   Other Topics Concern  . Not on file   Social History Narrative   Divorced.  Lives with her daughter, Linda [name].  She has two other daughters.  Ambulates with a walker at baseline.      Medication List       This list is accurate as of: 12/05/14  8:13 PM.  Always use your most recent med list.               acetaminophen 325 MG tablet  Commonly known as:  TYLENOL  Take 2 tablets (650 mg total) by mouth daily.     alendronate 70 MG tablet  Commonly known as:  FOSAMAX  Take 70 mg by mouth every 7 (seven) days. Saturday. Take with a full glass of water on an empty stomach.     alum & mag  hydroxide-simeth 200-200-20 MG/5ML suspension  Commonly known as:  MAALOX/MYLANTA  Take 30 mLs by mouth every 6 (six) hours as needed for indigestion or heartburn (dyspepsia).     Cranberry 250 MG Tabs  Take 1 tablet (250 mg total) by mouth daily.     donepezil 10 MG tablet  Commonly known as:  ARICEPT  Take 20 mg by mouth at bedtime.     escitalopram 20 MG tablet  Commonly known as:  LEXAPRO  Take 1 tablet (20 mg total) by mouth daily. For nerves     feeding supplement (GLUCERNA SHAKE) Liqd  Take 237 mLs by mouth 3 (three) times daily between meals.     levETIRAcetam 500 MG tablet  Commonly known as:  KEPPRA  Take 1 tablet (500 mg total) by mouth 2 (two) times daily.     methocarbamol 500 MG tablet  Commonly known as:  ROBAXIN  Take 500 mg by mouth 3 (three) times daily.     MYRBETRIQ 25 MG Tb24 tablet  Generic drug:  mirabegron ER  Take 25 mg by mouth daily.     nystatin powder  Commonly known as:  MYCOSTATIN  Apply 1 g topically daily. Apply under breasts and under stomach fold.     ondansetron 4 MG tablet  Commonly known as:  ZOFRAN  Take 4 mg by mouth every 8 (eight) hours as needed for nausea or vomiting (nausea).     oxyCODONE-acetaminophen 10-325 MG per tablet  Commonly known as:  PERCOCET  Take one tablet by mouth every 4 hours as needed for neck,back,shoulder and knee pain; Take one tablet by mouth every night at bedtime     risperiDONE 0.5 MG tablet  Commonly known as:  RISPERDAL  Take 1 tablet (0.5 mg total) by mouth 2 (two) times daily as needed.     senna 8.6 MG Tabs tablet  Commonly known as:  SENOKOT  Take 1 tablet by mouth daily as needed for mild constipation (constipation).     traZODone 100 MG tablet  Commonly known as:  DESYREL  Take 100 mg by mouth at bedtime.        Physical Exam  BP 148/65 mmHg  Pulse 67  Temp(Src) 97.7 F (36.5 C)  Resp 20  SpO2 97%   Constitutional: Obese elderly female in no acute distress.  HEENT:  Normocephalic and atraumatic. PERRL. EOM intact. No icterus.  Posterior pharynx clear of any exudate or lesions.  Neck: Supple and nontender. No lymphadenopathy, masses, or thyromegaly. No JVD or carotid bruits. Cardiac: Normal S1, S2. RRR without appreciable murmurs, rubs, or gallops. Distal pulses intact. No dependent edema noted  Lungs: No respiratory distress. Breath sounds clear bilaterally without rales, rhonchi, or wheezes. On continuous O2 at 3L via [place].  Abdomen: Audible bowel sounds in all quadrants. Soft, nontender, nondistended.  Musculoskeletal: Able to move all extremities. Tenderness to palpation across lower and mid upper back.  Skin: Warm and dry.  Neurological: Alert  Psychiatric: slightly restless   Labs Reviewed  CBC Latest Ref Rng 11/25/2014 08/22/2014 08/21/2014  WBC 4.0 - 10.5 K/uL 10.7(H) 6.6 10.9(H)  Hemoglobin 12.0 - 15.0 g/dL 9.5(L) 10.5(L) 11.1(L)  Hematocrit 36.0 - 46.0 % 29.0(L) 32.7(L) 33.8(L)  Platelets 150 - 400 K/uL 280 209 227    CMP Latest Ref Rng 11/25/2014 09/21/2014 08/22/2014  Glucose 70 - 99 mg/dL 145(H) - 137(H)  BUN 6 - 23 mg/dL 33(H) 28(A) 24(H)  Creatinine 0.50 - 1.10 mg/dL 1.39(H) 1.3(A) 1.18(H)  Sodium 135 - 145 mmol/L 144 137 143  Potassium 3.5 - 5.1 mmol/L 4.1 4.6 4.4  Chloride 96 - 112 mEq/L 108 - 104  CO2 19 - 32 mmol/L 25 - 26  Calcium 8.4 - 10.5 mg/dL 9.2 - 9.1  Total Protein 6.0 - 8.3 g/dL - - -  Total Bilirubin 0.3 - 1.2 mg/dL - - -  Alkaline Phos 39 - 117 U/L - - -  AST 0 - 37 U/L - - -  ALT 0 - 35 U/L - - -   Assessment & Plan 1. Bilateral back pain, unspecified location Continue tylenol 650mg  daily and percocet 10/325mg  every four as needed for pain (gets one dose daily at bedtime). Will start robaxin 500mg  three time daily for muscle tightness/spasms. Continue to monitor her status for now.   Family/Staff Communication Plan of care discussed with daughter, [name] and Engineer, civil (consulting). [name] and nursing staff verbalized  understanding and agree with plan of care. No additional questions or concerns reported.    Arthur Holms, MSN, AGNP-C [facility] [address] [place],  [phone] [8am-5pm] After hours: [phone]

## 2014-12-12 ENCOUNTER — Non-Acute Institutional Stay (SKILLED_NURSING_FACILITY): Payer: Medicare Other | Admitting: Registered Nurse

## 2014-12-12 DIAGNOSIS — R634 Abnormal weight loss: Secondary | ICD-10-CM

## 2014-12-12 DIAGNOSIS — F418 Other specified anxiety disorders: Secondary | ICD-10-CM

## 2014-12-12 DIAGNOSIS — K5900 Constipation, unspecified: Secondary | ICD-10-CM

## 2014-12-12 DIAGNOSIS — G4700 Insomnia, unspecified: Secondary | ICD-10-CM

## 2014-12-12 DIAGNOSIS — R32 Unspecified urinary incontinence: Secondary | ICD-10-CM

## 2014-12-12 DIAGNOSIS — F0390 Unspecified dementia without behavioral disturbance: Secondary | ICD-10-CM

## 2014-12-12 DIAGNOSIS — M810 Age-related osteoporosis without current pathological fracture: Secondary | ICD-10-CM

## 2014-12-12 DIAGNOSIS — N183 Chronic kidney disease, stage 3 unspecified: Secondary | ICD-10-CM

## 2014-12-12 DIAGNOSIS — D649 Anemia, unspecified: Secondary | ICD-10-CM

## 2014-12-12 DIAGNOSIS — J449 Chronic obstructive pulmonary disease, unspecified: Secondary | ICD-10-CM

## 2014-12-12 DIAGNOSIS — G40909 Epilepsy, unspecified, not intractable, without status epilepticus: Secondary | ICD-10-CM

## 2014-12-12 DIAGNOSIS — M5030 Other cervical disc degeneration, unspecified cervical region: Secondary | ICD-10-CM

## 2014-12-12 NOTE — Progress Notes (Signed)
Patient ID: Linda [name], female   DOB: 06/03/1930, 81 y.o.   MRN: [id number]   Place of Service: [facility]  Allergies  Allergen Reactions  . Atorvastatin Other (See Comments)    REACTION: increased CPK  . Cephalexin Nausea Only and Other (See Comments)    Weak, Headache, has tolerated ceftriaxone  . Cetirizine Hcl Other (See Comments)    REACTION: makes her head feel big  . Cortisone Other (See Comments)    REACTION: increased bp  . Ezetimibe-Simvastatin Other (See Comments)    unknown  . Fentanyl Other (See Comments)    REACTION: stinging in her skin  . Guaifenesin Other (See Comments)    unknown  . Ketorolac Tromethamine     REACTION: no work  . Lisinopril Cough    ACE related  . Metformin Other (See Comments)    epistaxis  . Nsaids Other (See Comments)    epistaxis  . Penicillins Nausea Only and Other (See Comments)    Headache, has tolerated Zosyn  . Rofecoxib Itching  . Rosuvastatin Other (See Comments)    Myalgias  . Simvastatin Other (See Comments)    unknown  . Spironolactone Nausea Only  . Sulfamethoxazole-Trimethoprim Other (See Comments)    unknown  . Codeine Rash  . Tape Rash    Code Status: DNR  Goals of Care: Comfort and Quality of Life/LTC  Chief Complaint  Patient presents with  . Medical Management of Chronic Issues    Dementia, seizure disorder, copd, osteoporosis, djd, constipation, depression, insomnia    HPI 81 y.o. female with PMH of dementia, seizure disorder, UI, depression, osteoporosis among many others is being seen for a routine visit for management of her chronic issues. Has 7 lbs weight loss since last routine visit. No recent fall or skin concern reported. No significant change in behaviors or functional status reported (able to ambulate using walker). Remains seizure free on keppra. Dementia is currently stable on aricept. UI is stable on myrbetriq. Ostoporosis is stable on fosamax. Depression is stable on lexapro. No  recent COPD exacerbations-stable on continuous O2 at 2lpm via nasal cannula. Insomnia stable trazodone. Seen in chair today. No complaints verbalized by patient.   Review of Systems Constitutional: Negative for fever and chills HENT: Negative for congestion Eyes: Negative for eye pain Cardiovascular: Negative for chest pain Respiratory: Negative cough and shortness of breath  Gastrointestinal: Negative for nausea and vomiting. Negative for abdominal pain Genitourinary: Negative for dysuria. Musculoskeletal: Negative for back pain, joint pain, and joint swelling  Neurological: Negative for dizziness and headache. Skin: Negative for rash.   Psychiatric: Negative for depression.  Past Medical History  Diagnosis Date  . Allergy     allergic rhinitis  . Diabetes mellitus     type II  . Hypertension   . Osteopenia   . TIA (transient ischemic attack)   . Contact dermatitis     on buttocks  . Nosebleed   . Anxiety   . Renal insufficiency   . Pyelonephritis   . Dementia   . Chiari malformation   . Seizure disorder   . ROTATOR CUFF TEAR 10/13/2007    Qualifier: Diagnosis of  By: Marcelino Scot CMA, [place] Bilberry      Past Surgical History  Procedure Laterality Date  . Rotator cuff repair  06/2007    right  . Abdominal hysterectomy    . Back surgery  03/1999    disk  . Breast surgery  breast biopsy x2 negative  . Shoulder surgery  2007    left  . Joint replacement      Rt knee patella replacement  . Colonoscopy  08/28/2012    Procedure: COLONOSCOPY;  Surgeon: Beryle Beams, MD;  Location: [facility];  Service: Endoscopy;  Laterality: N/A;    History   Social History  . Marital Status: Single    Spouse Name: N/A    Number of Children: N/A  . Years of Education: N/A   Occupational History  . Not on file.   Social History Main Topics  . Smoking status: Never Smoker   . Smokeless tobacco: Never Used  . Alcohol Use: No  . Drug Use: No  . Sexual Activity: No    Other Topics Concern  . Not on file   Social History Narrative   Divorced.  Lives with her daughter, Linda [name].  She has two other daughters.  Ambulates with a walker at baseline.      Medication List       This list is accurate as of: 12/12/14 10:45 PM.  Always use your most recent med list.               acetaminophen 325 MG tablet  Commonly known as:  TYLENOL  Take 2 tablets (650 mg total) by mouth daily.     alendronate 70 MG tablet  Commonly known as:  FOSAMAX  Take 70 mg by mouth every 7 (seven) days. Saturday. Take with a full glass of water on an empty stomach.     alum & mag hydroxide-simeth 200-200-20 MG/5ML suspension  Commonly known as:  MAALOX/MYLANTA  Take 30 mLs by mouth every 6 (six) hours as needed for indigestion or heartburn (dyspepsia).     Cranberry 250 MG Tabs  Take 1 tablet (250 mg total) by mouth daily.     donepezil 10 MG tablet  Commonly known as:  ARICEPT  Take 20 mg by mouth at bedtime.     escitalopram 20 MG tablet  Commonly known as:  LEXAPRO  Take 1 tablet (20 mg total) by mouth daily. For nerves     feeding supplement (GLUCERNA SHAKE) Liqd  Take 237 mLs by mouth 3 (three) times daily between meals.     levETIRAcetam 500 MG tablet  Commonly known as:  KEPPRA  Take 1 tablet (500 mg total) by mouth 2 (two) times daily.     methocarbamol 500 MG tablet  Commonly known as:  ROBAXIN  Take 500 mg by mouth 3 (three) times daily.     MYRBETRIQ 25 MG Tb24 tablet  Generic drug:  mirabegron ER  Take 25 mg by mouth daily.     nystatin powder  Commonly known as:  MYCOSTATIN  Apply 1 g topically daily. Apply under breasts and under stomach fold.     ondansetron 4 MG tablet  Commonly known as:  ZOFRAN  Take 4 mg by mouth every 8 (eight) hours as needed for nausea or vomiting (nausea).     oxyCODONE-acetaminophen 10-325 MG per tablet  Commonly known as:  PERCOCET  Take one tablet by mouth every 4 hours as needed for neck,back,shoulder and  knee pain; Take one tablet by mouth every night at bedtime     risperiDONE 0.5 MG tablet  Commonly known as:  RISPERDAL  Take 1 tablet (0.5 mg total) by mouth 2 (two) times daily as needed.     senna 8.6 MG Tabs tablet  Commonly known as:  SENOKOT  Take 1 tablet by mouth daily as needed for mild constipation (constipation).     traZODone 100 MG tablet  Commonly known as:  DESYREL  Take 100 mg by mouth at bedtime.        Physical Exam  BP 148/65 mmHg  Pulse 67  Temp(Src) 97.7 F (36.5 C)  Resp 20  Ht '5\' 5"'  (1.651 m)  Wt 202 lb 9.6 oz (91.899 kg)  BMI 33.71 kg/m2  SpO2 93%   Constitutional: Obese elderly female in no acute distress. Conversant and pleasant HEENT: Normocephalic and atraumatic. PERRL. EOM intact. No icterus. Oral mucosa moist. Posterior pharynx clear of any exudate or lesions.  Neck: Supple and nontender. No lymphadenopathy, masses, or thyromegaly. No JVD or carotid bruits. Cardiac: Normal S1, S2. RRR without appreciable murmurs, rubs, or gallops. Distal pulses intact. 1+ pitting edema of BLE.  Lungs: No respiratory distress. Breath sounds clear bilaterally without rales, rhonchi, or wheezes. On continuous O2 at 3L via [place].  Abdomen: Audible bowel sounds in all quadrants. Soft, nontender, nondistended.  Musculoskeletal: Able to move all extremities. Able to ambulate with walker Skin: Warm and dry. No rash noted.  Neurological: Alert and oriented to self Psychiatric: Appropriate mood and affect.   Labs Reviewed  CBC Latest Ref Rng 11/25/2014 08/22/2014 08/21/2014  WBC 4.0 - 10.5 K/uL 10.7(H) 6.6 10.9(H)  Hemoglobin 12.0 - 15.0 g/dL 9.5(L) 10.5(L) 11.1(L)  Hematocrit 36.0 - 46.0 % 29.0(L) 32.7(L) 33.8(L)  Platelets 150 - 400 K/uL 280 209 227    CMP Latest Ref Rng 11/25/2014 09/21/2014 08/22/2014  Glucose 70 - 99 mg/dL 145(H) - 137(H)  BUN 6 - 23 mg/dL 33(H) 28(A) 24(H)  Creatinine 0.50 - 1.10 mg/dL 1.39(H) 1.3(A) 1.18(H)  Sodium 135 - 145 mmol/L 144 137  143  Potassium 3.5 - 5.1 mmol/L 4.1 4.6 4.4  Chloride 96 - 112 mEq/L 108 - 104  CO2 19 - 32 mmol/L 25 - 26  Calcium 8.4 - 10.5 mg/dL 9.2 - 9.1  Total Protein 6.0 - 8.3 g/dL - - -  Total Bilirubin 0.3 - 1.2 mg/dL - - -  Alkaline Phos 39 - 117 U/L - - -  AST 0 - 37 U/L - - -  ALT 0 - 35 U/L - - -   Assessment & Plan 1. Weight loss Has 7lbs weight loss since last monthly visit. Most likely secondary dementia and increased mobility as resident has been ambulating more the past few weeks. Continue current diet and dietary supplement. Continue to monitor.   2. COPD, mild Stable. Continue continuous oxygen therapy at 3L via [place] and monitor.   3. Constipation, unspecified constipation type Stable. Continue senna 8.27m daily as needed and monitor. Encourage hydration.    4. Seizure disorder Remains seizure free. Continue keppra [id number] daily and monitor.   5. Dementia without behavioral disturbance Stable with decline anticipated. Continue aricept 2059maily and monitor for change in behaviors. Continue assist for ADL care. Continue fall risk and pressure ulcer precautions.   6. Urinary incontinence, unspecified incontinence type Stable. Continue myrbetriq 38m20mily and monitor.   7. Depression with anxiety Stable. Continue lexapro 20mg45mly, risperdal 0.5mg t61me daily, and monitor for change in mood  8. Osteoporosis Stable. Continue fosamax 70mg w67my. Fall risks precautions  9. Insomnia Stable. Continue trazodone 100mg da28mat bedtime and monitor  10. DJD (degenerative joint disease) of cervical s pine Stable. Continue tylenol 650mg dai37mn the morning, robaxin 500mg thre45mmes daily, and percocet  75m/325mg daily at bedtime and Q4H as needed. Continue to monitor her status  11. CKD stage 3 Recent eGFR 34. Avoid nephrotoxic agents. Continue to monitor renal function.   12. Normocytic anemia Most likely in the setting of chronic kidney disease. No signs of bleeding.  Will check stool for blood and iron panel. Continue to monitor h&h   Labs/Diagnostic Studies Ordered: iron panel, Hemoccult   Family/Staff Communication Plan of care discussed with nursing staff. Nursing staff verbalized understanding and agree with plan of care. No additional questions or concerns reported.    KArthur Holms MSN, AGNP-C [facility]153 [address] RoadSCentennial Park [place] [id number]([phone][8am-5pm] After hours: [phone]

## 2014-12-13 ENCOUNTER — Other Ambulatory Visit: Payer: Self-pay | Admitting: *Deleted

## 2014-12-13 MED ORDER — OXYCODONE-ACETAMINOPHEN 10-325 MG PO TABS: ORAL_TABLET | ORAL | Status: DC

## 2014-12-13 NOTE — Telephone Encounter (Signed)
[place]

## 2015-01-01 ENCOUNTER — Non-Acute Institutional Stay (SKILLED_NURSING_FACILITY): Payer: Medicare Other | Admitting: Registered Nurse

## 2015-01-01 ENCOUNTER — Encounter: Payer: Self-pay | Admitting: Registered Nurse

## 2015-01-01 DIAGNOSIS — F411 Generalized anxiety disorder: Secondary | ICD-10-CM

## 2015-01-01 DIAGNOSIS — R600 Localized edema: Secondary | ICD-10-CM | POA: Diagnosis not present

## 2015-01-01 NOTE — Progress Notes (Signed)
Patient ID: Linda [name], female   DOB: 17-Mar-1930, 81 y.o.   MRN: [id number]   Place of Service: [facility]  Allergies  Allergen Reactions  . Atorvastatin Other (See Comments)    REACTION: increased CPK  . Cephalexin Nausea Only and Other (See Comments)    Weak, Headache, has tolerated ceftriaxone  . Cetirizine Hcl Other (See Comments)    REACTION: makes her head feel big  . Cortisone Other (See Comments)    REACTION: increased bp  . Ezetimibe-Simvastatin Other (See Comments)    unknown  . Fentanyl Other (See Comments)    REACTION: stinging in her skin  . Guaifenesin Other (See Comments)    unknown  . Ketorolac Tromethamine     REACTION: no work  . Lisinopril Cough    ACE related  . Metformin Other (See Comments)    epistaxis  . Nsaids Other (See Comments)    epistaxis  . Penicillins Nausea Only and Other (See Comments)    Headache, has tolerated Zosyn  . Rofecoxib Itching  . Rosuvastatin Other (See Comments)    Myalgias  . Simvastatin Other (See Comments)    unknown  . Spironolactone Nausea Only  . Sulfamethoxazole-Trimethoprim Other (See Comments)    unknown  . Codeine Rash  . Tape Rash    Code Status: DNR  Goals of Care: Comfort and Quality of Life/LTC  Chief Complaint  Patient presents with  . Acute Visit    Bil leg edema and pain. Anxiety    HPI 81 y.o. female with PMH of dementia, seizure disorder, UI, depression, osteoporosis among many others is being seen for an acute visit at the request of daughters, [name] and [name] for the evaluation of bilateral LE edema with pain cellulitis increased anxiety. They were concern about the possibility of bilateral leg cellulitis as she has had that in the past. Per daughter, since her recent change in psychotropic medications, patient has also been noticing to be more anxious and not sleeping much at night. She is also more restless during the day-gets up out of chair and ambulates using walking  frequently. Patient is unable to tolerate compression stocking/bandage for her leg edema. Seen in chair today. Unable to verbalize her concerns, only stating "I don't know".   Review of Systems Unable to obtain due to dementia  Past Medical History  Diagnosis Date  . Allergy     allergic rhinitis  . Diabetes mellitus     type II  . Hypertension   . Osteopenia   . TIA (transient ischemic attack)   . Contact dermatitis     on buttocks  . Nosebleed   . Anxiety   . Renal insufficiency   . Pyelonephritis   . Dementia   . Chiari malformation   . Seizure disorder   . ROTATOR CUFF TEAR 10/13/2007    Qualifier: Diagnosis of  By: Marcelino Scot CMA, [place] Bilberry      Past Surgical History  Procedure Laterality Date  . Rotator cuff repair  06/2007    right  . Abdominal hysterectomy    . Back surgery  03/1999    disk  . Breast surgery      breast biopsy x2 negative  . Shoulder surgery  2007    left  . Joint replacement      Rt knee patella replacement  . Colonoscopy  08/28/2012    Procedure: COLONOSCOPY;  Surgeon: Beryle Beams, MD;  Location: [facility] ENDOSCOPY;  Service: Endoscopy;  Laterality: N/A;    History   Social History  . Marital Status: Single    Spouse Name: N/A  . Number of Children: N/A  . Years of Education: N/A   Occupational History  . Not on file.   Social History Main Topics  . Smoking status: Never Smoker   . Smokeless tobacco: Never Used  . Alcohol Use: No  . Drug Use: No  . Sexual Activity: No   Other Topics Concern  . Not on file   Social History Narrative   Divorced.  Lives with her daughter, [name].  She has two other daughters.  Ambulates with a walker at baseline.      Medication List       This list is accurate as of: 01/01/15  3:45 PM.  Always use your most recent med list.               acetaminophen 325 MG tablet  Commonly known as:  TYLENOL  Take 2 tablets (650 mg total) by mouth daily.     alendronate 70 MG tablet  Commonly  known as:  FOSAMAX  Take 70 mg by mouth every 7 (seven) days. Saturday. Take with a full glass of water on an empty stomach.     alum & mag hydroxide-simeth 200-200-20 MG/5ML suspension  Commonly known as:  MAALOX/MYLANTA  Take 30 mLs by mouth every 6 (six) hours as needed for indigestion or heartburn (dyspepsia).     busPIRone 10 MG tablet  Commonly known as:  BUSPAR  Take 10 mg by mouth 2 (two) times daily.     Cranberry 250 MG Tabs  Take 1 tablet (250 mg total) by mouth daily.     donepezil 10 MG tablet  Commonly known as:  ARICEPT  Take 20 mg by mouth at bedtime.     escitalopram 20 MG tablet  Commonly known as:  LEXAPRO  Take 1 tablet (20 mg total) by mouth daily. For nerves     feeding supplement (GLUCERNA SHAKE) Liqd  Take 237 mLs by mouth 3 (three) times daily between meals.     levETIRAcetam 500 MG tablet  Commonly known as:  KEPPRA  Take 1 tablet (500 mg total) by mouth 2 (two) times daily.     methocarbamol 500 MG tablet  Commonly known as:  ROBAXIN  Take 500 mg by mouth 3 (three) times daily.     MYRBETRIQ 25 MG Tb24 tablet  Generic drug:  mirabegron ER  Take 25 mg by mouth daily.     nystatin powder  Commonly known as:  MYCOSTATIN  Apply 1 g topically daily. Apply under breasts and under stomach fold.     ondansetron 4 MG tablet  Commonly known as:  ZOFRAN  Take 4 mg by mouth every 8 (eight) hours as needed for nausea or vomiting (nausea).     oxyCODONE-acetaminophen 10-325 MG per tablet  Commonly known as:  PERCOCET  Take one tablet by mouth every 4 hours as needed for neck,back,shoulder and knee pain; Take one tablet by mouth every night at bedtime     senna 8.6 MG Tabs tablet  Commonly known as:  SENOKOT  Take 1 tablet by mouth daily as needed for mild constipation (constipation).     traZODone 100 MG tablet  Commonly known as:  DESYREL  Take 100 mg by mouth at bedtime.        Physical Exam  BP 130/60 mmHg  Pulse 72  Temp(Src) 97.4 F  (  36.3 C)  Resp 20  SpO2 98%   Constitutional: Obese elderly female in no acute distress. Conversant with confusions. Able to follow simple commands HEENT: Normocephalic and atraumatic. PERRL. No scleral icterus. Oral mucosa moist. Posterior pharynx clear of any exudate or lesions.  Neck: Supple and nontender. No lymphadenopathy, masses, or thyromegaly. No JVD or carotid bruits. Cardiac: Normal S1, S2. RRR without appreciable murmurs, rubs, or gallops. Distal pulses intact. 2+ pitting edema of BLE.  Lungs: No respiratory distress. Breath sounds clear bilaterally without rales, rhonchi, or wheezes. Abdomen: Audible bowel sounds in all quadrants. Soft, nontender, nondistended.  Musculoskeletal: Able to move all extremities. Able to ambulate with walker. Skin: Warm and dry. No rash noted. BLE has 2+ pitting edema and tender to touch. BLE slightly erythematous, but w/o signs of cellulitis, DVT, or other infections.  Neurological: Alert and oriented to self Psychiatric: appears anxious and restless   Labs Reviewed  CBC Latest Ref Rng 11/25/2014 08/22/2014 08/21/2014  WBC 4.0 - 10.5 K/uL 10.7(H) 6.6 10.9(H)  Hemoglobin 12.0 - 15.0 g/dL 9.5(L) 10.5(L) 11.1(L)  Hematocrit 36.0 - 46.0 % 29.0(L) 32.7(L) 33.8(L)  Platelets 150 - 400 K/uL 280 209 227    CMP Latest Ref Rng 11/25/2014 09/21/2014 08/22/2014  Glucose 70 - 99 mg/dL 145(H) - 137(H)  BUN 6 - 23 mg/dL 33(H) 28(A) 24(H)  Creatinine 0.50 - 1.10 mg/dL 1.39(H) 1.3(A) 1.18(H)  Sodium 135 - 145 mmol/L 144 137 143  Potassium 3.5 - 5.1 mmol/L 4.1 4.6 4.4  Chloride 96 - 112 mEq/L 108 - 104  CO2 19 - 32 mmol/L 25 - 26  Calcium 8.4 - 10.5 mg/dL 9.2 - 9.1  Total Protein 6.0 - 8.3 g/dL - - -  Total Bilirubin 0.3 - 1.2 mg/dL - - -  Alkaline Phos 39 - 117 U/L - - -  AST 0 - 37 U/L - - -  ALT 0 - 35 U/L - - -   Assessment & Plan 1. Bilateral edema of lower extremity Mostly likely secondary to chronic venous insufficiency. Unable to tolerate  compression stocking/bandage per family. Will start lasix 20mg  daily x 3 days the 10mg  daily (hold if BP<110/60). Check bmet in one week. Daughter, [name] is aware of the change. If no improvement over the next few days, will consider treating her prophylactically for cellulitis. Encourage family to bring her recliner from home so she can elevate her legs.   2. Generalized anxiety disorder Buspar was recently increased from 5mg  twice daily to 10mg  twice daily by psy. Will not make any change at the time and continue to monitor her mood/behavior. Fall risk precautions  Time spent: 30 minutes on counseling   Family/Staff Communication Plan of care discussed with nursing staff and daughter, [name]. Nursing staff and [name] verbalized understanding and agree with plan of care. No additional questions or concerns reported.    Arthur Holms, MSN, AGNP-C [facility] [address] [place], [place] [address] [phone] [8am-5pm] After hours: [phone]

## 2015-01-09 ENCOUNTER — Non-Acute Institutional Stay (SKILLED_NURSING_FACILITY): Payer: Medicare Other | Admitting: Registered Nurse

## 2015-01-09 DIAGNOSIS — M5030 Other cervical disc degeneration, unspecified cervical region: Secondary | ICD-10-CM | POA: Diagnosis not present

## 2015-01-09 DIAGNOSIS — J449 Chronic obstructive pulmonary disease, unspecified: Secondary | ICD-10-CM | POA: Diagnosis not present

## 2015-01-09 DIAGNOSIS — G40909 Epilepsy, unspecified, not intractable, without status epilepticus: Secondary | ICD-10-CM | POA: Diagnosis not present

## 2015-01-09 DIAGNOSIS — N183 Chronic kidney disease, stage 3 unspecified: Secondary | ICD-10-CM

## 2015-01-09 DIAGNOSIS — K5900 Constipation, unspecified: Secondary | ICD-10-CM

## 2015-01-09 DIAGNOSIS — F418 Other specified anxiety disorders: Secondary | ICD-10-CM | POA: Diagnosis not present

## 2015-01-09 DIAGNOSIS — M810 Age-related osteoporosis without current pathological fracture: Secondary | ICD-10-CM | POA: Diagnosis not present

## 2015-01-09 DIAGNOSIS — G4700 Insomnia, unspecified: Secondary | ICD-10-CM

## 2015-01-09 DIAGNOSIS — R32 Unspecified urinary incontinence: Secondary | ICD-10-CM | POA: Diagnosis not present

## 2015-01-09 DIAGNOSIS — F0390 Unspecified dementia without behavioral disturbance: Secondary | ICD-10-CM

## 2015-01-09 DIAGNOSIS — I872 Venous insufficiency (chronic) (peripheral): Secondary | ICD-10-CM

## 2015-01-09 DIAGNOSIS — D649 Anemia, unspecified: Secondary | ICD-10-CM | POA: Diagnosis not present

## 2015-01-10 ENCOUNTER — Encounter: Payer: Self-pay | Admitting: Registered Nurse

## 2015-01-10 NOTE — Progress Notes (Signed)
Patient ID: Linda [name], female   DOB: [birth year], 81 y.o.   MRN: [id number]   Place of Service: [facility]  Allergies  Allergen Reactions  . Atorvastatin Other (See Comments)    REACTION: increased CPK  . Cephalexin Nausea Only and Other (See Comments)    Weak, Headache, has tolerated ceftriaxone  . Cetirizine Hcl Other (See Comments)    REACTION: makes her head feel big  . Cortisone Other (See Comments)    REACTION: increased bp  . Ezetimibe-Simvastatin Other (See Comments)    unknown  . Fentanyl Other (See Comments)    REACTION: stinging in her skin  . Guaifenesin Other (See Comments)    unknown  . Ketorolac Tromethamine     REACTION: no work  . Lisinopril Cough    ACE related  . Metformin Other (See Comments)    epistaxis  . Nsaids Other (See Comments)    epistaxis  . Penicillins Nausea Only and Other (See Comments)    Headache, has tolerated Zosyn  . Rofecoxib Itching  . Rosuvastatin Other (See Comments)    Myalgias  . Simvastatin Other (See Comments)    unknown  . Spironolactone Nausea Only  . Sulfamethoxazole-Trimethoprim Other (See Comments)    unknown  . Codeine Rash  . Tape Rash    Code Status: DNR  Goals of Care: Comfort and Quality of Life/LTC  Chief Complaint  Patient presents with  . Medical Management of Chronic Issues    depression w/ anxiety, seizure, copd, constipation, UI, osteoporosis, insomonia, dementia    HPI 81 y.o. female with PMH of dementia, seizure disorder, UI, depression, osteoporosis among many others is being seen for a routine visit for management of her chronic issues. Has 3 lbs weight loss since last routine visit. No recent fall or skin concern reported. No significant change in behaviors or functional status reported-ambulates frequently using walker. Remains seizure free on keppra. Dementia is currently stable on aricept with decline is antipated. UI is stable on myrbetriq. Ostoporosis is stable on fosamax.  Depression is stable on lexapro. No recent COPD exacerbations-stable on continuous O2 at 2lpm via nasal cannula. Has more issues with anxiety and insomnia with recent med changes- Psy just discontinued buspar and restarted resident back on risperdal.   Review of Systems Constitutional: Negative for fever and chills HENT: Negative for congestion Eyes: Negative for eye pain Cardiovascular: Negative for chest pain Respiratory: Negative cough and shortness of breath  Gastrointestinal: Negative for nausea and vomiting. Negative for abdominal pain Genitourinary: Negative for dysuria. Musculoskeletal: Negative for back pain, joint pain, and joint swelling  Neurological: Negative for dizziness and headache. Skin: Negative for rash.   Psychiatric: Negative for depression.  Past Medical History  Diagnosis Date  . Allergy     allergic rhinitis  . Diabetes mellitus     type II  . Hypertension   . Osteopenia   . TIA (transient ischemic attack)   . Contact dermatitis     on buttocks  . Nosebleed   . Anxiety   . Renal insufficiency   . Pyelonephritis   . Dementia   . Chiari malformation   . Seizure disorder   . ROTATOR CUFF TEAR 10/13/2007    Qualifier: Diagnosis of  By: Marcelino Scot CMA, [place] Bilberry      Past Surgical History  Procedure Laterality Date  . Rotator cuff repair  06/2007    right  . Abdominal hysterectomy    . Back surgery  03/1999  disk  . Breast surgery      breast biopsy x2 negative  . Shoulder surgery  2007    left  . Joint replacement      Rt knee patella replacement  . Colonoscopy  08/28/2012    Procedure: COLONOSCOPY;  Surgeon: Beryle Beams, MD;  Location: [facility] ENDOSCOPY;  Service: Endoscopy;  Laterality: N/A;    History   Social History  . Marital Status: Single    Spouse Name: N/A  . Number of Children: N/A  . Years of Education: N/A   Occupational History  . Not on file.   Social History Main Topics  . Smoking status: Never Smoker   . Smokeless  tobacco: Never Used  . Alcohol Use: No  . Drug Use: No  . Sexual Activity: No   Other Topics Concern  . Not on file   Social History Narrative   Divorced.  Lives with her daughter, [name].  She has two other daughters.  Ambulates with a walker at baseline.      Medication List       This list is accurate as of: 01/09/15 11:59 PM.  Always use your most recent med list.               acetaminophen 325 MG tablet  Commonly known as:  TYLENOL  Take 2 tablets (650 mg total) by mouth daily.     alendronate 70 MG tablet  Commonly known as:  FOSAMAX  Take 70 mg by mouth every 7 (seven) days. Saturday. Take with a full glass of water on an empty stomach.     alum & mag hydroxide-simeth 200-200-20 MG/5ML suspension  Commonly known as:  MAALOX/MYLANTA  Take 30 mLs by mouth every 6 (six) hours as needed for indigestion or heartburn (dyspepsia).     Cranberry 250 MG Tabs  Take 1 tablet (250 mg total) by mouth daily.     donepezil 10 MG tablet  Commonly known as:  ARICEPT  Take 20 mg by mouth at bedtime.     escitalopram 20 MG tablet  Commonly known as:  LEXAPRO  Take 1 tablet (20 mg total) by mouth daily. For nerves     feeding supplement (GLUCERNA SHAKE) Liqd  Take 237 mLs by mouth 3 (three) times daily between meals.     furosemide 20 MG tablet  Commonly known as:  LASIX  Take 10 mg by mouth daily.     levETIRAcetam 500 MG tablet  Commonly known as:  KEPPRA  Take 1 tablet (500 mg total) by mouth 2 (two) times daily.     methocarbamol 500 MG tablet  Commonly known as:  ROBAXIN  Take 500 mg by mouth every 8 (eight) hours as needed.     MYRBETRIQ 25 MG Tb24 tablet  Generic drug:  mirabegron ER  Take 25 mg by mouth daily.     nystatin powder  Commonly known as:  MYCOSTATIN  Apply 1 g topically daily. Apply under breasts and under stomach fold.     ondansetron 4 MG tablet  Commonly known as:  ZOFRAN  Take 4 mg by mouth every 8 (eight) hours as needed for nausea or  vomiting (nausea).     oxyCODONE-acetaminophen 10-325 MG per tablet  Commonly known as:  PERCOCET  Take one tablet by mouth every 4 hours as needed for neck,back,shoulder and knee pain; Take one tablet by mouth every night at bedtime     risperiDONE 0.25 MG tablet  Commonly known  as:  RISPERDAL  Take 0.25-0.5 mg by mouth 2 (two) times daily. 0.25mg  daily qam and 0.5mg  qhs     senna 8.6 MG Tabs tablet  Commonly known as:  SENOKOT  Take 1 tablet by mouth daily as needed for mild constipation (constipation).     traZODone 50 MG tablet  Commonly known as:  DESYREL  Take 50 mg by mouth at bedtime.        Physical Exam  BP 130/60 mmHg  Pulse 72  Temp(Src) 97.4 F (36.3 C)  Resp 19  Ht 5\' 5"  (1.651 m)  Wt 189 lb (85.73 kg)  BMI 31.45 kg/m2  SpO2 98%   Constitutional: Obese elderly female in no acute distress. Conversant with confusions. Able to follow simple commands HEENT: Normocephalic and atraumatic. PERRL. No scleral icterus. Oral mucosa moist. Posterior pharynx clear of any exudate or lesions.  Neck: Supple and nontender. No lymphadenopathy, masses, or thyromegaly. No JVD or carotid bruits. Cardiac: Normal S1, S2. RRR without appreciable murmurs, rubs, or gallops. Distal pulses intact. 1+ pitting edema of BLE. Lungs: No respiratory distress. Breath sounds clear bilaterally without rales, rhonchi, or wheezes. Abdomen: Audible bowel sounds in all quadrants. Soft, nontender, nondistended.  Musculoskeletal: Able to move all extremities. Able to ambulate with walker. Skin: Warm and dry. No rash noted.   Neurological: Alert and oriented to self Psychiatric: Calm. Appropriate mood and affect  Labs Reviewed  CBC Latest Ref Rng 11/25/2014 08/22/2014 08/21/2014  WBC 4.0 - 10.5 K/uL 10.7(H) 6.6 10.9(H)  Hemoglobin 12.0 - 15.0 g/dL 9.5(L) 10.5(L) 11.1(L)  Hematocrit 36.0 - 46.0 % 29.0(L) 32.7(L) 33.8(L)  Platelets 150 - 400 K/uL 280 209 227    CMP Latest Ref Rng 11/25/2014  09/21/2014 08/22/2014  Glucose 70 - 99 mg/dL 145(H) - 137(H)  BUN 6 - 23 mg/dL 33(H) 28(A) 24(H)  Creatinine 0.50 - 1.10 mg/dL 1.39(H) 1.3(A) 1.18(H)  Sodium 135 - 145 mmol/L 144 137 143  Potassium 3.5 - 5.1 mmol/L 4.1 4.6 4.4  Chloride 96 - 112 mEq/L 108 - 104  CO2 19 - 32 mmol/L 25 - 26  Calcium 8.4 - 10.5 mg/dL 9.2 - 9.1  Total Protein 6.0 - 8.3 g/dL - - -  Total Bilirubin 0.3 - 1.2 mg/dL - - -  Alkaline Phos 39 - 117 U/L - - -  AST 0 - 37 U/L - - -  ALT 0 - 35 U/L - - -   Assessment & Plan 1. COPD, mild Stable. No recent exacerbation. Takes her nasal cannula off frequently. Continue oxygen therapy at 3L via [place] and monitor.   2. Constipation, unspecified constipation type Stable. Continue senna 8.6mg  daily as needed and monitor. Encourage hydration.    3. Seizure disorder Remains seizure free. Continue keppra 500mg  twice daily and monitor.   4. Dementia without behavioral disturbance Stable with decline anticipated. Continue aricept 20mg  daily and monitor for change in behaviors. Continue assist for ADL care. Continue fall risk and pressure ulcer precautions.   5. Urinary incontinence, unspecified incontinence type Stable. Continue myrbetriq 25mg  daily and monitor.   6. Venous insufficiency BLE improves since initiation of lasix. Unable to tolerate ted hose. Continue lasix 10mg  daily and monitor.   7. Depression with anxiety Is currently followed by psy. Continue lexapro 10mg  daily, risperdal 0.25mg  daily in the morning and 0.5mg  daily at bedtime. Continue to monitor for change in mood.   8. Osteoporosis Stable. Continue fosamax 70mg  weekly. Fall risks precautions  9. Insomnia Persists. Continue trazodone  50mg  daily at bedtime and monitor  10. DJD (degenerative joint disease) of cervical spine Stable. Continue tylenol 650mg  daily in the morning, robaxin 500mg  three times daily as needed, and percocet 10mg /325mg  daily at bedtime and Q4H as needed. Continue to monitor her  status  11. CKD stage 3 Stable. Avoid nephrotoxic agents. Continue to monitor renal function.   12. Normocytic anemia Recheck cbc, iron panel, and ferritin level. Hemoccult x 3-nursing staff to notify NP/MD if positive. Continue to monitor for now.   Labs: cbc, iron panel, ferritin level   Family/Staff Communication Plan of care discussed with nursing staff. Nursing staff verbalized understanding and agree with plan of care. No additional questions or concerns reported.    Arthur Holms, MSN, AGNP-C [facility] [address] [place], [place] [address] [phone] [8am-5pm] After hours: [phone]

## 2015-01-29 LAB — BASIC METABOLIC PANEL
BUN: 30 mg/dL — AB (ref 4–21)
Creatinine: 1.3 mg/dL — AB (ref 0.5–1.1)
Glucose: 97 mg/dL
POTASSIUM: 4 mmol/L (ref 3.4–5.3)
SODIUM: 143 mmol/L (ref 137–147)

## 2015-02-12 ENCOUNTER — Non-Acute Institutional Stay (SKILLED_NURSING_FACILITY): Payer: Medicare Other | Admitting: Internal Medicine

## 2015-02-12 DIAGNOSIS — N189 Chronic kidney disease, unspecified: Secondary | ICD-10-CM | POA: Diagnosis not present

## 2015-02-12 DIAGNOSIS — M810 Age-related osteoporosis without current pathological fracture: Secondary | ICD-10-CM | POA: Diagnosis not present

## 2015-02-12 DIAGNOSIS — E46 Unspecified protein-calorie malnutrition: Secondary | ICD-10-CM

## 2015-02-12 DIAGNOSIS — D638 Anemia in other chronic diseases classified elsewhere: Secondary | ICD-10-CM | POA: Insufficient documentation

## 2015-02-12 DIAGNOSIS — F0391 Unspecified dementia with behavioral disturbance: Secondary | ICD-10-CM | POA: Diagnosis not present

## 2015-02-12 DIAGNOSIS — F03918 Unspecified dementia, unspecified severity, with other behavioral disturbance: Secondary | ICD-10-CM | POA: Insufficient documentation

## 2015-02-12 NOTE — Progress Notes (Signed)
Patient ID: Linda [name], female   DOB: 1930-02-06, 81 y.o.   MRN: [id number]    Facility: [facility]   Chief Complaint  Patient presents with  . Medical Management of Chronic Issues   Allergies  Allergen Reactions  . Atorvastatin Other (See Comments)    REACTION: increased CPK  . Cephalexin Nausea Only and Other (See Comments)    Weak, Headache, has tolerated ceftriaxone  . Cetirizine Hcl Other (See Comments)    REACTION: makes her head feel big  . Cortisone Other (See Comments)    REACTION: increased bp  . Ezetimibe-Simvastatin Other (See Comments)    unknown  . Fentanyl Other (See Comments)    REACTION: stinging in her skin  . Guaifenesin Other (See Comments)    unknown  . Ketorolac Tromethamine     REACTION: no work  . Lisinopril Cough    ACE related  . Metformin Other (See Comments)    epistaxis  . Nsaids Other (See Comments)    epistaxis  . Penicillins Nausea Only and Other (See Comments)    Headache, has tolerated Zosyn  . Rofecoxib Itching  . Rosuvastatin Other (See Comments)    Myalgias  . Simvastatin Other (See Comments)    unknown  . Spironolactone Nausea Only  . Sulfamethoxazole-Trimethoprim Other (See Comments)    unknown  . Codeine Rash  . Tape Rash   Code status: DNR  HPI 81 y/o female patient is seen today for routine visit. She has pmh of dementia, seizures, osteoporosis, seizure, depression among others. She is seen in her room today. Her mood is stable. No seizures reported. No falls reported. No new skin concern. She has been at her baseline as per staff. History taking and ROS is limited. No concerns from staff  ROS Denies fever or chills Denies chest pain or dyspnea On o2 by nasal canula Denies abdominal pain, nausea or vomiting  Past Medical History  Diagnosis Date  . Allergy     allergic rhinitis  . Diabetes mellitus     type II  . Hypertension   . Osteopenia   . TIA (transient ischemic attack)   .  Contact dermatitis     on buttocks  . Nosebleed   . Anxiety   . Renal insufficiency   . Pyelonephritis   . Dementia   . Chiari malformation   . Seizure disorder   . ROTATOR CUFF TEAR 10/13/2007    Qualifier: Diagnosis of  By: Marcelino Scot CMA, [place] Bilberry     Current Outpatient Prescriptions on File Prior to Visit  Medication Sig Dispense Refill  . acetaminophen (TYLENOL) 325 MG tablet Take 2 tablets (650 mg total) by mouth daily. 120 tablet 11  . alendronate (FOSAMAX) 70 MG tablet Take 70 mg by mouth every 7 (seven) days. Saturday. Take with a full glass of water on an empty stomach.    Marland Kitchen alum & mag hydroxide-simeth (MAALOX/MYLANTA) 200-200-20 MG/5ML suspension Take 30 mLs by mouth every 6 (six) hours as needed for indigestion or heartburn (dyspepsia). 355 mL 0  . Cranberry 250 MG TABS Take 1 tablet (250 mg total) by mouth daily. 30 tablet 11  . donepezil (ARICEPT) 10 MG tablet Take 20 mg by mouth at bedtime.     Marland Kitchen escitalopram (LEXAPRO) 10 MG tablet Take 10 mg by mouth daily.    . feeding supplement, GLUCERNA SHAKE, (GLUCERNA SHAKE) LIQD Take 237 mLs by mouth 3 (three) times daily between meals.  0  .  furosemide (LASIX) 20 MG tablet Take 10 mg by mouth daily.    Marland Kitchen levETIRAcetam (KEPPRA) 500 MG tablet Take 1 tablet (500 mg total) by mouth 2 (two) times daily. 60 tablet 3  . methocarbamol (ROBAXIN) 500 MG tablet Take 500 mg by mouth every 8 (eight) hours as needed.     . mirabegron ER (MYRBETRIQ) 25 MG TB24 tablet Take 25 mg by mouth daily.    Marland Kitchen nystatin (MYCOSTATIN) powder Apply 1 g topically daily. Apply under breasts and under stomach fold.    . ondansetron (ZOFRAN) 4 MG tablet Take 4 mg by mouth every 8 (eight) hours as needed for nausea or vomiting (nausea).     Marland Kitchen oxyCODONE-acetaminophen (PERCOCET) 10-325 MG per tablet Take one tablet by mouth every 4 hours as needed for neck,back,shoulder and knee pain; Take one tablet by mouth every night at bedtime 210 tablet 0  . risperiDONE  (RISPERDAL) 0.25 MG tablet Take 0.25-0.5 mg by mouth 2 (two) times daily. 0.25mg  daily qam and 0.5mg  qhs    . senna (SENOKOT) 8.6 MG TABS tablet Take 1 tablet by mouth daily as needed for mild constipation (constipation).     . traZODone (DESYREL) 50 MG tablet Take 50 mg by mouth at bedtime.     No current facility-administered medications on file prior to visit.    Physical exam BP 111/50 mmHg  Pulse 52  Temp(Src) 98.5 F (36.9 C)  Resp 18  SpO2 95%  General- elderly obese female in no acute distress Head- atraumatic, normocephalic Eyes- PERRLA, EOMI, no pallor, no icterus, no discharge Neck- no lymphadenopathy Mouth- normal mucus membrane Cardiovascular- normal s1,s2, no murmurs, normal distal pulses, trace leg edema Respiratory- bilateral clear to auscultation, no wheeze, no rhonchi, no crackles, on o2 Abdomen- bowel sounds present, soft, non tender Musculoskeletal- able to move all 4 extremities, uses walker Neurological- no focal deficit, alert and oriented to self Skin- warm and dry Psychiatry- normal mood and affect  Labs CBC Latest Ref Rng 11/25/2014 08/22/2014 08/21/2014  WBC 4.0 - 10.5 K/uL 10.7(H) 6.6 10.9(H)  Hemoglobin 12.0 - 15.0 g/dL 9.5(L) 10.5(L) 11.1(L)  Hematocrit 36.0 - 46.0 % 29.0(L) 32.7(L) 33.8(L)  Platelets 150 - 400 K/uL 280 209 227   CMP Latest Ref Rng 11/25/2014 09/21/2014 08/22/2014  Glucose 70 - 99 mg/dL 145(H) - 137(H)  BUN 6 - 23 mg/dL 33(H) 28(A) 24(H)  Creatinine 0.50 - 1.10 mg/dL 1.39(H) 1.3(A) 1.18(H)  Sodium 135 - 145 mmol/L 144 137 143  Potassium 3.5 - 5.1 mmol/L 4.1 4.6 4.4  Chloride 96 - 112 mEq/L 108 - 104  CO2 19 - 32 mmol/L 25 - 26  Calcium 8.4 - 10.5 mg/dL 9.2 - 9.1  Total Protein 6.0 - 8.3 g/dL - - -  Total Bilirubin 0.3 - 1.2 mg/dL - - -  Alkaline Phos 39 - 117 U/L - - -  AST 0 - 37 U/L - - -  ALT 0 - 35 U/L - - -   01/18/15 iron 36, tibc 232, % sat 16, hb 10.1, hct 30.6, mcv 89.5, ferritin 47  Assessment/plan  Protein  calorie malnutrition Low albumin. Continue house shakes, monitor weight, weight overall stable, continue pressure ulcer prophylaxis and skin care. Encourage po intake  Dementia with behavioral disturbance Stable, continue risperdal 0.25 mg in am and 0.5 mg in pm with aricept, lexapro and trazodone  Osteoporosis Continue weekly fosamax. Add oscal-vit d 600-400 1 tab bid  anemia Has anemia of chronic disease with low iron  and TIBC level. Add ferrous sulfate 325 mg bid, recheck cbc in 6 weeks. Monitor cbc. Continue stool softners  Chronic kidney disease Monitor renal function, on lasix

## 2015-03-12 ENCOUNTER — Non-Acute Institutional Stay (SKILLED_NURSING_FACILITY): Payer: Medicare Other | Admitting: Registered Nurse

## 2015-03-12 ENCOUNTER — Encounter: Payer: Self-pay | Admitting: Registered Nurse

## 2015-03-12 DIAGNOSIS — F0391 Unspecified dementia with behavioral disturbance: Secondary | ICD-10-CM | POA: Diagnosis not present

## 2015-03-12 DIAGNOSIS — G4700 Insomnia, unspecified: Secondary | ICD-10-CM

## 2015-03-12 DIAGNOSIS — M810 Age-related osteoporosis without current pathological fracture: Secondary | ICD-10-CM | POA: Diagnosis not present

## 2015-03-12 DIAGNOSIS — K5900 Constipation, unspecified: Secondary | ICD-10-CM

## 2015-03-12 DIAGNOSIS — N183 Chronic kidney disease, stage 3 unspecified: Secondary | ICD-10-CM

## 2015-03-12 DIAGNOSIS — D638 Anemia in other chronic diseases classified elsewhere: Secondary | ICD-10-CM

## 2015-03-12 DIAGNOSIS — F418 Other specified anxiety disorders: Secondary | ICD-10-CM

## 2015-03-12 DIAGNOSIS — I872 Venous insufficiency (chronic) (peripheral): Secondary | ICD-10-CM

## 2015-03-12 DIAGNOSIS — J449 Chronic obstructive pulmonary disease, unspecified: Secondary | ICD-10-CM | POA: Diagnosis not present

## 2015-03-12 DIAGNOSIS — R5383 Other fatigue: Secondary | ICD-10-CM | POA: Diagnosis not present

## 2015-03-12 DIAGNOSIS — G40909 Epilepsy, unspecified, not intractable, without status epilepticus: Secondary | ICD-10-CM | POA: Diagnosis not present

## 2015-03-12 DIAGNOSIS — R32 Unspecified urinary incontinence: Secondary | ICD-10-CM | POA: Diagnosis not present

## 2015-03-12 DIAGNOSIS — M5030 Other cervical disc degeneration, unspecified cervical region: Secondary | ICD-10-CM

## 2015-03-12 DIAGNOSIS — F03918 Unspecified dementia, unspecified severity, with other behavioral disturbance: Secondary | ICD-10-CM

## 2015-03-12 NOTE — Progress Notes (Signed)
Patient ID: LEXUS SHAMPINE, female   DOB: 04/30/1930, 81 y.o.   MRN: [id number]   Place of Service: [facility]  Allergies  Allergen Reactions  . Atorvastatin Other (See Comments)    REACTION: increased CPK  . Cephalexin Nausea Only and Other (See Comments)    Weak, Headache, has tolerated ceftriaxone  . Cetirizine Hcl Other (See Comments)    REACTION: makes her head feel big  . Cortisone Other (See Comments)    REACTION: increased bp  . Ezetimibe-Simvastatin Other (See Comments)    unknown  . Fentanyl Other (See Comments)    REACTION: stinging in her skin  . Guaifenesin Other (See Comments)    unknown  . Ketorolac Tromethamine     REACTION: no work  . Lisinopril Cough    ACE related  . Metformin Other (See Comments)    epistaxis  . Nsaids Other (See Comments)    epistaxis  . Penicillins Nausea Only and Other (See Comments)    Headache, has tolerated Zosyn  . Rofecoxib Itching  . Rosuvastatin Other (See Comments)    Myalgias  . Simvastatin Other (See Comments)    unknown  . Spironolactone Nausea Only  . Sulfamethoxazole-Trimethoprim Other (See Comments)    unknown  . Codeine Rash  . Tape Rash    Code Status: DNR  Goals of Care: Comfort and Quality of Life/LTC  Chief Complaint  Patient presents with  . Medical Management of Chronic Issues    dementia, Venous insufficiency, CKD, osteoporosis, depression/anxiety, COPD, insomnia, seizure disorder    HPI 81 y.o. female with PMH of dementia, seizure disorder, UI, depression, osteoporosis among many others is being seen for a routine visit for management of her chronic issues. Weight over all stable over the past 30 days. No recent fall or skin concern reported. No change in behaviors or functional status reported-ambulates frequently using walker. Dementia is currently stable on aricept. UI is stable on myrbetriq. Ostoporosis is stable on fosamax. Mood is stable on lexapro and risperdal. Insomnia stable on  trazodone. No recent COPD exacerbations-stable on O2 via nasal cannula. Remains seizure free. Just finished 7-day course of abx for UTI yesterday. Per nursing supervisor, daughter would like to repeat urine culture to ensure resolution of infection as patient is still lethargic. Seen in room today. Reported feeling tired and sleepy. Denies any other concerns.   Review of Systems Constitutional: Negative for fever and chills HENT: Negative for congestion Eyes: Negative for eye pain Cardiovascular: Negative for chest pain Respiratory: Negative cough and shortness of breath  Gastrointestinal: Negative for nausea and vomiting. Negative for abdominal pain Genitourinary: Negative for dysuria. Musculoskeletal: Negative for uncontrolled pain  Neurological: Negative for dizziness and headache. Skin: Negative for rash.   Psychiatric: Negative for depression.  Past Medical History  Diagnosis Date  . Allergy     allergic rhinitis  . Diabetes mellitus     type II  . Hypertension   . Osteopenia   . TIA (transient ischemic attack)   . Contact dermatitis     on buttocks  . Nosebleed   . Anxiety   . Renal insufficiency   . Pyelonephritis   . Dementia   . Chiari malformation   . Seizure disorder   . ROTATOR CUFF TEAR 10/13/2007    Qualifier: Diagnosis of  By: Marcelino Scot CMA, [place] Bilberry      Past Surgical History  Procedure Laterality Date  . Rotator cuff repair  06/2007    right  .  Abdominal hysterectomy    . Back surgery  03/1999    disk  . Breast surgery      breast biopsy x2 negative  . Shoulder surgery  2007    left  . Joint replacement      Rt knee patella replacement  . Colonoscopy  08/28/2012    Procedure: COLONOSCOPY;  Surgeon: Beryle Beams, MD;  Location: [facility] ENDOSCOPY;  Service: Endoscopy;  Laterality: N/A;    History   Social History  . Marital Status: Single    Spouse Name: N/A  . Number of Children: N/A  . Years of Education: N/A   Occupational History  . Not  on file.   Social History Main Topics  . Smoking status: Never Smoker   . Smokeless tobacco: Never Used  . Alcohol Use: No  . Drug Use: No  . Sexual Activity: No   Other Topics Concern  . Not on file   Social History Narrative   Divorced.  Lives with her daughter, [name].  She has two other daughters.  Ambulates with a walker at baseline.      Medication List       This list is accurate as of: 03/12/15 11:31 PM.  Always use your most recent med list.               acetaminophen 325 MG tablet  Commonly known as:  TYLENOL  Take 2 tablets (650 mg total) by mouth daily.     alendronate 70 MG tablet  Commonly known as:  FOSAMAX  Take 70 mg by mouth every 7 (seven) days. Saturday. Take with a full glass of water on an empty stomach.     alum & mag hydroxide-simeth 200-200-20 MG/5ML suspension  Commonly known as:  MAALOX/MYLANTA  Take 30 mLs by mouth every 6 (six) hours as needed for indigestion or heartburn (dyspepsia).     calcium-vitamin D 500-400 MG-UNIT per tablet  Commonly known as:  OSCAL-500  Take 1 tablet by mouth 2 (two) times daily.     Cranberry 250 MG Tabs  Take 1 tablet (250 mg total) by mouth daily.     donepezil 10 MG tablet  Commonly known as:  ARICEPT  Take 20 mg by mouth at bedtime.     escitalopram 10 MG tablet  Commonly known as:  LEXAPRO  Take 10 mg by mouth daily.     feeding supplement (GLUCERNA SHAKE) Liqd  Take 237 mLs by mouth 3 (three) times daily between meals.     ferrous sulfate 325 (65 FE) MG tablet  Take 325 mg by mouth 2 (two) times daily with a meal.     furosemide 20 MG tablet  Commonly known as:  LASIX  Take 10 mg by mouth daily.     levETIRAcetam 500 MG tablet  Commonly known as:  KEPPRA  Take 1 tablet (500 mg total) by mouth 2 (two) times daily.     methocarbamol 500 MG tablet  Commonly known as:  ROBAXIN  Take 500 mg by mouth every 8 (eight) hours as needed.     MYRBETRIQ 25 MG Tb24 tablet  Generic drug:   mirabegron ER  Take 25 mg by mouth daily.     nystatin powder  Commonly known as:  MYCOSTATIN  Apply 1 g topically daily. Apply under breasts and under stomach fold.     ondansetron 4 MG tablet  Commonly known as:  ZOFRAN  Take 4 mg by mouth every 8 (eight) hours  as needed for nausea or vomiting (nausea).     oxyCODONE-acetaminophen 10-325 MG per tablet  Commonly known as:  PERCOCET  Take one tablet by mouth every 4 hours as needed for neck,back,shoulder and knee pain; Take one tablet by mouth every night at bedtime     risperiDONE 0.25 MG tablet  Commonly known as:  RISPERDAL  Take 0.25-0.5 mg by mouth 2 (two) times daily. 0.25mg  daily qam and 0.5mg  qhs     senna 8.6 MG Tabs tablet  Commonly known as:  SENOKOT  Take 1 tablet by mouth daily as needed for mild constipation (constipation).     traZODone 50 MG tablet  Commonly known as:  DESYREL  Take 50 mg by mouth at bedtime.        Physical Exam  BP 142/65 mmHg  Pulse 63  Temp(Src) 98.2 F (36.8 C)  Resp 16  Ht 5\' 5"  (1.651 m)  Wt 184 lb 3.2 oz (83.553 kg)  BMI 30.65 kg/m2  SpO2 91%   Constitutional: Obese but frail elderly female in no acute distress. Conversant with confusions. Able to follow simple commands HEENT: Normocephalic and atraumatic. PERRL. No scleral icterus. Oral mucosa moist. Posterior pharynx clear of any exudate or lesions.  Neck: Supple and nontender. No lymphadenopathy, masses, or thyromegaly. No JVD or carotid bruits. Cardiac: Normal S1, S2. RRR without appreciable murmurs, rubs, or gallops. Distal pulses intact. Trace dependent edema Lungs: No respiratory distress. Breath sounds clear bilaterally without rales, rhonchi, or wheezes. Abdomen: Audible bowel sounds in all quadrants. Soft, nontender, nondistended.  Musculoskeletal: Able to move all extremities. Able to ambulate with walker. Skin: Warm and dry. No rash noted.   Neurological: Arouseable.  Psychiatric: Appropriate mood and affect    Labs Reviewed  CBC Latest Ref Rng 11/25/2014 08/22/2014 08/21/2014  WBC 4.0 - 10.5 K/uL 10.7(H) 6.6 10.9(H)  Hemoglobin 12.0 - 15.0 g/dL 9.5(L) 10.5(L) 11.1(L)  Hematocrit 36.0 - 46.0 % 29.0(L) 32.7(L) 33.8(L)  Platelets 150 - 400 K/uL 280 209 227    CMP Latest Ref Rng 01/29/2015 11/25/2014 09/21/2014  Glucose 70 - 99 mg/dL - 145(H) -  BUN 4 - 21 mg/dL 30(A) 33(H) 28(A)  Creatinine 0.5 - 1.1 mg/dL 1.3(A) 1.39(H) 1.3(A)  Sodium 137 - 147 mmol/L 143 144 137  Potassium 3.4 - 5.3 mmol/L 4.0 4.1 4.6  Chloride 96 - 112 mEq/L - 108 -  CO2 19 - 32 mmol/L - 25 -  Calcium 8.4 - 10.5 mg/dL - 9.2 -  Total Protein 6.0 - 8.3 g/dL - - -  Total Bilirubin 0.3 - 1.2 mg/dL - - -  Alkaline Phos 39 - 117 U/L - - -  AST 0 - 37 U/L - - -  ALT 0 - 35 U/L - - -   Assessment & Plan 1. COPD, mild No recent exacerbation. Takes her nasal cannula off frequently. Continue oxygen therapy at 3L via [place] to maintain O2 sat >88%. Monitor clinically.  2. Constipation, unspecified constipation type Stable. Continue senna 8.6mg  daily as needed and monitor. Encourage hydration.    3. Seizure disorder No seizure reported. Continue keppra 500mg  twice daily and monitor.   4. Dementia without behavioral disturbance Stable, decline anticipated. Continue aricept 20mg  daily. Continue assist for ADL care. Fall and pressure ulcer precautions.   5. Urinary incontinence, unspecified incontinence type Stable. Continue myrbetriq 25mg  daily  6. Venous insufficiency BLE swelling improved. Continue lasix 10mg  daily to help with swelling as resident is unable to tolerate TED hose.  7. Depression with anxiety Mood stable. Is currently followed by psy. Continue lexapro 10mg  daily, risperdal 0.25mg  daily in the morning and 0.5mg  daily at bedtime. Continue to monitor for change in mood.   8. Osteoporosis Stable. Continue fosamax 70mg  weekly with oscal-D 500/400mg  twice daily. Fall risks precautions  9. Insomnia Ongoing. Continue  trazodone 50mg  daily at bedtime.  10. DJD (degenerative joint disease) of cervical spine Stable. Continue tylenol 650mg  daily in the morning, robaxin 500mg  three times daily as needed, and percocet 10mg /325mg  daily at bedtime and Q4H as needed. Continue to monitor her status  11. CKD stage 3 Stable. Avoid nephrotoxic agents, particularly NSAIDs. Continue to monitor renal function.   12. Anemia of chronic disease Last hgb 9.5. Continue ferrous sulfate 325mg  twice daily. Continue to monitor h&h  13. Lethargy Most likely in the setting of recent UTI. Will recheck labs for further evaluation. Urine culture is not indicated at this time as patient just finished abx yesterday and does not have any urinary complaints. Encourage hydration and continue to monitor her status.    Labs: cbc w/ diff, cmp   Family/Staff Communication Plan of care discussed with nursing supervisor. Nursing supervisor verbalized understanding and agree with plan of care. No additional questions or concerns reported.    Arthur Holms, MSN, AGNP-C [facility] [address] [place], [place] [address] [phone] [8am-5pm] After hours: [phone]

## 2015-03-13 LAB — CBC AND DIFFERENTIAL
HCT: 32 % — AB (ref 36–46)
HEMOGLOBIN: 10.5 g/dL — AB (ref 12.0–16.0)
Platelets: 254 10*3/uL (ref 150–399)
WBC: 7.9 10*3/mL

## 2015-03-13 LAB — BASIC METABOLIC PANEL
BUN: 34 mg/dL — AB (ref 4–21)
CREATININE: 1.4 mg/dL — AB (ref 0.5–1.1)
Glucose: 103 mg/dL
Potassium: 4.2 mmol/L (ref 3.4–5.3)
Sodium: 141 mmol/L (ref 137–147)

## 2015-03-14 ENCOUNTER — Encounter: Payer: Self-pay | Admitting: Registered Nurse

## 2015-03-14 ENCOUNTER — Non-Acute Institutional Stay (SKILLED_NURSING_FACILITY): Payer: Medicare Other | Admitting: Registered Nurse

## 2015-03-14 DIAGNOSIS — F418 Other specified anxiety disorders: Secondary | ICD-10-CM | POA: Diagnosis not present

## 2015-03-14 DIAGNOSIS — F03918 Unspecified dementia, unspecified severity, with other behavioral disturbance: Secondary | ICD-10-CM

## 2015-03-14 DIAGNOSIS — F0391 Unspecified dementia with behavioral disturbance: Secondary | ICD-10-CM

## 2015-03-14 NOTE — Progress Notes (Signed)
Patient ID: Linda [name], female   DOB: 11-03-1930, 81 y.o.   MRN: [id number]   Place of Service: [facility]  Allergies  Allergen Reactions  . Atorvastatin Other (See Comments)    REACTION: increased CPK  . Cephalexin Nausea Only and Other (See Comments)    Weak, Headache, has tolerated ceftriaxone  . Cetirizine Hcl Other (See Comments)    REACTION: makes her head feel big  . Cortisone Other (See Comments)    REACTION: increased bp  . Ezetimibe-Simvastatin Other (See Comments)    unknown  . Fentanyl Other (See Comments)    REACTION: stinging in her skin  . Guaifenesin Other (See Comments)    unknown  . Ketorolac Tromethamine     REACTION: no work  . Lisinopril Cough    ACE related  . Metformin Other (See Comments)    epistaxis  . Nsaids Other (See Comments)    epistaxis  . Penicillins Nausea Only and Other (See Comments)    Headache, has tolerated Zosyn  . Rofecoxib Itching  . Rosuvastatin Other (See Comments)    Myalgias  . Simvastatin Other (See Comments)    unknown  . Spironolactone Nausea Only  . Sulfamethoxazole-Trimethoprim Other (See Comments)    unknown  . Codeine Rash  . Tape Rash    Code Status: DNR  Goals of Care: Comfort and Quality of Life/LTC  Chief Complaint  Patient presents with  . Acute Visit    anxiety, fear, and wandering    HPI 81 y.o. female with PMH of dementia, seizure disorder, UI, depression, osteoporosis among many others is being seen for an acute visit at the request of family and nursing staff for increased anxiety and wandering behavior. Per daughter [name], patient was really anxious over the weekend-teary and stated she feels afraid. Daughter would like for her to have something for her anxiety as she is moving to another room and will has a roommate today. Denies any other concerns.    Review of Systems Constitutional: Negative for fever and chills. Stated "still hanging here" HENT: Negative for congestion Eyes:  Negative for eye pain Cardiovascular: Negative for chest pain Respiratory: Negative cough and shortness of breath  Gastrointestinal: Negative for nausea and vomiting. Negative for abdominal pain Genitourinary: Negative for dysuria. Musculoskeletal: Negative for uncontrolled pain  Neurological: Negative for dizziness and headache. Skin: Negative for rash.   Psychiatric: Negative for depression.  Past Medical History  Diagnosis Date  . Allergy     allergic rhinitis  . Diabetes mellitus     type II  . Hypertension   . Osteopenia   . TIA (transient ischemic attack)   . Contact dermatitis     on buttocks  . Nosebleed   . Anxiety   . Renal insufficiency   . Pyelonephritis   . Dementia   . Chiari malformation   . Seizure disorder   . ROTATOR CUFF TEAR 10/13/2007    Qualifier: Diagnosis of  By: Marcelino Scot CMA, [place]      Past Surgical History  Procedure Laterality Date  . Rotator cuff repair  06/2007    right  . Abdominal hysterectomy    . Back surgery  03/1999    disk  . Breast surgery      breast biopsy x2 negative  . Shoulder surgery  2007    left  . Joint replacement      Rt knee patella replacement  . Colonoscopy  08/28/2012    Procedure: COLONOSCOPY;  Surgeon: Beryle Beams, MD;  Location: [facility] ENDOSCOPY;  Service: Endoscopy;  Laterality: N/A;    History   Social History  . Marital Status: Single    Spouse Name: N/A  . Number of Children: N/A  . Years of Education: N/A   Occupational History  . Not on file.   Social History Main Topics  . Smoking status: Never Smoker   . Smokeless tobacco: Never Used  . Alcohol Use: No  . Drug Use: No  . Sexual Activity: No   Other Topics Concern  . Not on file   Social History Narrative   Divorced.  Lives with her daughter, [name].  She has two other daughters.  Ambulates with a walker at baseline.      Medication List       This list is accurate as of: 03/14/15  2:27 PM.  Always use your most recent med  list.               acetaminophen 325 MG tablet  Commonly known as:  TYLENOL  Take 2 tablets (650 mg total) by mouth daily.     alendronate 70 MG tablet  Commonly known as:  FOSAMAX  Take 70 mg by mouth every 7 (seven) days. Saturday. Take with a full glass of water on an empty stomach.     alum & mag hydroxide-simeth 200-200-20 MG/5ML suspension  Commonly known as:  MAALOX/MYLANTA  Take 30 mLs by mouth every 6 (six) hours as needed for indigestion or heartburn (dyspepsia).     calcium-vitamin D 500-400 MG-UNIT per tablet  Commonly known as:  OSCAL-500  Take 1 tablet by mouth 2 (two) times daily.     Cranberry 250 MG Tabs  Take 1 tablet (250 mg total) by mouth daily.     donepezil 10 MG tablet  Commonly known as:  ARICEPT  Take 20 mg by mouth at bedtime.     escitalopram 10 MG tablet  Commonly known as:  LEXAPRO  Take 10 mg by mouth daily.     feeding supplement (GLUCERNA SHAKE) Liqd  Take 237 mLs by mouth 3 (three) times daily between meals.     ferrous sulfate 325 (65 FE) MG tablet  Take 325 mg by mouth 2 (two) times daily with a meal.     furosemide 20 MG tablet  Commonly known as:  LASIX  Take 10 mg by mouth daily.     levETIRAcetam 500 MG tablet  Commonly known as:  KEPPRA  Take 1 tablet (500 mg total) by mouth 2 (two) times daily.     LORazepam 0.5 MG tablet  Commonly known as:  ATIVAN  Take 0.25 mg by mouth every 8 (eight) hours as needed for anxiety.     memantine 14 MG Cp24 24 hr capsule  Commonly known as:  NAMENDA XR  Take 14 mg by mouth daily.     methocarbamol 500 MG tablet  Commonly known as:  ROBAXIN  Take 500 mg by mouth every 8 (eight) hours as needed.     MYRBETRIQ 25 MG Tb24 tablet  Generic drug:  mirabegron ER  Take 25 mg by mouth daily.     nystatin powder  Commonly known as:  MYCOSTATIN  Apply 1 g topically daily. Apply under breasts and under stomach fold.     ondansetron 4 MG tablet  Commonly known as:  ZOFRAN  Take 4 mg by  mouth every 8 (eight) hours as needed for nausea or vomiting (nausea).  oxyCODONE-acetaminophen 10-325 MG per tablet  Commonly known as:  PERCOCET  Take one tablet by mouth every 4 hours as needed for neck,back,shoulder and knee pain; Take one tablet by mouth every night at bedtime     oxyCODONE-acetaminophen 10-325 MG per tablet  Commonly known as:  PERCOCET  Take one tablet by mouth every 4 hours as needed for neck, back, shoulder and knee pain; Take one tablet by mouth every night at bedtime for pain. Do not exceed 4gm of Tylenol in 24 hours.     risperiDONE 0.25 MG tablet  Commonly known as:  RISPERDAL  Take 0.25-0.5 mg by mouth 2 (two) times daily. 0.25mg  daily qam and 0.5mg  qhs     senna 8.6 MG Tabs tablet  Commonly known as:  SENOKOT  Take 1 tablet by mouth daily as needed for mild constipation (constipation).     traZODone 50 MG tablet  Commonly known as:  DESYREL  Take 50 mg by mouth at bedtime.        Physical Exam  BP 129/79 mmHg  Pulse 70  Temp(Src) 98.1 F (36.7 C)  Resp 18  SpO2 95%   Constitutional: Obese but frail elderly female in no acute distress. Conversant with confusions. Able to follow simple commands HEENT: Normocephalic and atraumatic. PERRL. No scleral icterus. Oral mucosa moist. Posterior pharynx clear of any exudate or lesions.  Neck: Supple and nontender. No lymphadenopathy, masses, or thyromegaly. No JVD or carotid bruits. Cardiac: Normal S1, S2. RRR without appreciable murmurs, rubs, or gallops. Distal pulses intact. Trace dependent edema Lungs: No respiratory distress. Breath sounds clear bilaterally without rales, rhonchi, or wheezes. Abdomen: Audible bowel sounds in all quadrants. Soft, nontender, nondistended.  Musculoskeletal: Able to move all extremities. Able to ambulate with walker. Skin: Warm and dry. No rash noted.   Neurological: Arouseable.  Psychiatric: appears tired.  Labs Reviewed  CBC Latest Ref Rng 03/13/2015 11/25/2014  08/22/2014  WBC - 7.9 10.7(H) 6.6  Hemoglobin 12.0 - 16.0 g/dL 10.5(A) 9.5(L) 10.5(L)  Hematocrit 36 - 46 % 32(A) 29.0(L) 32.7(L)  Platelets 150 - 399 K/L 254 280 209    CMP Latest Ref Rng 03/13/2015 01/29/2015 11/25/2014  Glucose 70 - 99 mg/dL - - 145(H)  BUN 4 - 21 mg/dL 34(A) 30(A) 33(H)  Creatinine 0.5 - 1.1 mg/dL 1.4(A) 1.3(A) 1.39(H)  Sodium 137 - 147 mmol/L 141 143 144  Potassium 3.4 - 5.3 mmol/L 4.2 4.0 4.1  Chloride 96 - 112 mEq/L - - 108  CO2 19 - 32 mmol/L - - 25  Calcium 8.4 - 10.5 mg/dL - - 9.2  Total Protein 6.0 - 8.3 g/dL - - -  Total Bilirubin 0.3 - 1.2 mg/dL - - -  Alkaline Phos 39 - 117 U/L - - -  AST 0 - 37 U/L - - -  ALT 0 - 35 U/L - - -   Assessment & Plan 4. Dementia without behavioral disturbance Progressive. Wandering and increased anxiety/fear could be r/t to worsening of dementia. Continue aricept 20mg  daily.  Start namenda xr 7mg  daily x 1 week then 14mg  daily. Reassess and continue. Monitor for change in behavior. Fall and pressure ulcer precautions.   7. Depression with anxiety More anxious per family and night shift staff. Continue lexapro 10mg  daily, risperdal 0.25mg  daily in the morning and 0.5mg  daily at bedtime. Start ativan 0.25mg  every 8 hours as needed for anxiety. Reassess and continue to monitor for change in mood.    Family/Staff Communication Plan of care  discussed with daughter, [name] and nursing staff. Daughter and nursing staff verbalized understanding and agree with plan of care. No additional questions or concerns reported.    Arthur Holms, MSN, AGNP-C [facility] [address] [place], [place] [address] [phone] [8am-5pm] After hours: [phone]

## 2015-03-27 LAB — HEPATIC FUNCTION PANEL
ALK PHOS: 43 U/L (ref 25–125)
ALT: 16 U/L (ref 7–35)
AST: 15 U/L (ref 13–35)
Bilirubin, Total: 0.5 mg/dL

## 2015-03-27 LAB — BASIC METABOLIC PANEL
BUN: 35 mg/dL — AB (ref 4–21)
Creatinine: 1.2 mg/dL — AB (ref 0.5–1.1)
Glucose: 89 mg/dL
POTASSIUM: 4.6 mmol/L (ref 3.4–5.3)
Sodium: 138 mmol/L (ref 137–147)

## 2015-03-27 LAB — CBC AND DIFFERENTIAL
HCT: 31 % — AB (ref 36–46)
Hemoglobin: 10.2 g/dL — AB (ref 12.0–16.0)
Platelets: 241 10*3/uL (ref 150–399)
WBC: 7.6 10^3/mL

## 2015-05-23 ENCOUNTER — Non-Acute Institutional Stay (SKILLED_NURSING_FACILITY): Payer: Medicare Other | Admitting: Nurse Practitioner

## 2015-05-23 ENCOUNTER — Encounter: Payer: Self-pay | Admitting: Nurse Practitioner

## 2015-05-23 DIAGNOSIS — D649 Anemia, unspecified: Secondary | ICD-10-CM | POA: Diagnosis not present

## 2015-05-23 DIAGNOSIS — F03918 Unspecified dementia, unspecified severity, with other behavioral disturbance: Secondary | ICD-10-CM

## 2015-05-23 DIAGNOSIS — F0391 Unspecified dementia with behavioral disturbance: Secondary | ICD-10-CM

## 2015-05-23 DIAGNOSIS — M5030 Other cervical disc degeneration, unspecified cervical region: Secondary | ICD-10-CM

## 2015-05-23 DIAGNOSIS — N189 Chronic kidney disease, unspecified: Secondary | ICD-10-CM | POA: Diagnosis not present

## 2015-05-23 DIAGNOSIS — R32 Unspecified urinary incontinence: Secondary | ICD-10-CM

## 2015-05-23 DIAGNOSIS — I119 Hypertensive heart disease without heart failure: Secondary | ICD-10-CM

## 2015-05-23 DIAGNOSIS — I10 Essential (primary) hypertension: Secondary | ICD-10-CM | POA: Diagnosis not present

## 2015-05-23 NOTE — Progress Notes (Signed)
Patient ID: Linda [name], female   DOB: Nov 27, 1929, 81 y.o.   MRN: [id number]    Nursing Home Location:  [place] of Service: SNF (31)  PCP: Leonides Sake, MD  Allergies  Allergen Reactions  . Atorvastatin Other (See Comments)    REACTION: increased CPK  . Cephalexin Nausea Only and Other (See Comments)    Weak, Headache, has tolerated ceftriaxone  . Cetirizine Hcl Other (See Comments)    REACTION: makes her head feel big  . Cortisone Other (See Comments)    REACTION: increased bp  . Ezetimibe-Simvastatin Other (See Comments)    unknown  . Fentanyl Other (See Comments)    REACTION: stinging in her skin  . Guaifenesin Other (See Comments)    unknown  . Ketorolac Tromethamine     REACTION: no work  . Lisinopril Cough    ACE related  . Metformin Other (See Comments)    epistaxis  . Nsaids Other (See Comments)    epistaxis  . Penicillins Nausea Only and Other (See Comments)    Headache, has tolerated Zosyn  . Rofecoxib Itching  . Rosuvastatin Other (See Comments)    Myalgias  . Simvastatin Other (See Comments)    unknown  . Spironolactone Nausea Only  . Sulfamethoxazole-Trimethoprim Other (See Comments)    unknown  . Codeine Rash  . Tape Rash    Chief Complaint  Patient presents with  . Medical Management of Chronic Issues    Routine Visit     HPI:  Patient is a 81 y.o. female seen today at [facility] for routine follow up on chronic conditions. Pt with a PMH of dementia, seizure disorder, UI, depression, osteoporosis. Staff reports patient goes through cycles were she will be fine and then at times she gets more agitated, pacing the halls and yelling out. Over the last day she has had increased confusion and agitation. Currently has a new roommate. No noted seizures per staff. No changes with bowel or bladder, remains incontinent. No worsening of shortness of breath. No noted pain.  Review of Systems:  Review of  Systems  Unable to perform ROS: Dementia    Past Medical History  Diagnosis Date  . Allergy     allergic rhinitis  . Diabetes mellitus     type II  . Hypertension   . Osteopenia   . TIA (transient ischemic attack)   . Contact dermatitis     on buttocks  . Nosebleed   . Anxiety   . Renal insufficiency   . Pyelonephritis   . Dementia   . Chiari malformation   . Seizure disorder   . ROTATOR CUFF TEAR 10/13/2007    Qualifier: Diagnosis of  By: Marcelino Scot CMA, [place]     Past Surgical History  Procedure Laterality Date  . Rotator cuff repair  06/2007    right  . Abdominal hysterectomy    . Back surgery  03/1999    disk  . Breast surgery      breast biopsy x2 negative  . Shoulder surgery  2007    left  . Joint replacement      Rt knee patella replacement  . Colonoscopy  08/28/2012    Procedure: COLONOSCOPY;  Surgeon: Beryle Beams, MD;  Location: [facility] ENDOSCOPY;  Service: Endoscopy;  Laterality: N/A;   Social History:   reports that she has never smoked. She has never used smokeless tobacco. She reports that she  does not drink alcohol or use illicit drugs.  Family History  Problem Relation Age of Onset  . Stroke Mother   . Stroke Father     Medications: Patient's Medications  New Prescriptions   No medications on file  Previous Medications   ALENDRONATE (FOSAMAX) 70 MG TABLET    Take 70 mg by mouth every 7 (seven) days. Saturday. Take with a full glass of water on an empty stomach.   ALUM & MAG HYDROXIDE-SIMETH (MAALOX/MYLANTA) 200-200-20 MG/5ML SUSPENSION    Take 30 mLs by mouth every 6 (six) hours as needed for indigestion or heartburn (dyspepsia).   CALCIUM-VITAMIN D (OSCAL-500) 500-400 MG-UNIT PER TABLET    Take 1 tablet by mouth 2 (two) times daily.   CRANBERRY 250 MG TABS    Take 1 tablet (250 mg total) by mouth daily.   DONEPEZIL (ARICEPT) 10 MG TABLET    Take 20 mg by mouth at bedtime.    ESCITALOPRAM (LEXAPRO) 10 MG TABLET    Take 10 mg by mouth daily.     FERROUS SULFATE 325 (65 FE) MG TABLET    Take 325 mg by mouth 2 (two) times daily with a meal.   FUROSEMIDE (LASIX) 20 MG TABLET    Take 10 mg by mouth daily.   LEVETIRACETAM (KEPPRA) 500 MG TABLET    Take 1 tablet (500 mg total) by mouth 2 (two) times daily.   LORAZEPAM (ATIVAN) 0.5 MG TABLET    Take 0.25 mg by mouth every 8 (eight) hours as needed for anxiety.   MEMANTINE (NAMENDA XR) 14 MG CP24 24 HR CAPSULE    Take 14 mg by mouth daily.   METHOCARBAMOL (ROBAXIN) 500 MG TABLET    Take 500 mg by mouth 3 (three) times daily as needed.    MIRABEGRON ER (MYRBETRIQ) 25 MG TB24 TABLET    Take 25 mg by mouth daily.   ONDANSETRON (ZOFRAN) 4 MG TABLET    Take 4 mg by mouth every 8 (eight) hours as needed for nausea or vomiting (nausea).    OXYCODONE-ACETAMINOPHEN (PERCOCET) 10-325 MG PER TABLET    Take one tablet by mouth every 4 hours as needed for neck, back, shoulder and knee pain; Take one tablet by mouth every night at bedtime for pain. Do not exceed 4gm of Tylenol in 24 hours.   OXYGEN    Inhale 3 L into the lungs continuous.   POLYETHYLENE GLYCOL (MIRALAX / GLYCOLAX) PACKET    Take 17 g by mouth 2 (two) times daily.   RISPERIDONE (RISPERDAL) 0.25 MG TABLET    Take 0.25-0.5 mg by mouth 2 (two) times daily. 0.25mg  daily qam and 0.5mg  qhs   SENNA (SENOKOT) 8.6 MG TABS TABLET    Take 1 tablet by mouth daily as needed for mild constipation (constipation).    TRAZODONE (DESYREL) 50 MG TABLET    Take 50 mg by mouth at bedtime.  Modified Medications   Modified Medication Previous Medication   ACETAMINOPHEN (TYLENOL) 325 MG TABLET acetaminophen (TYLENOL) 325 MG tablet      Take 2 tablets (650 mg total) by mouth every 6 (six) hours as needed.    Take 2 tablets (650 mg total) by mouth daily.   FEEDING SUPPLEMENT, GLUCERNA SHAKE, ([place]) LIQD feeding supplement, GLUCERNA SHAKE, (GLUCERNA SHAKE) LIQD      Take 237 mLs by mouth 2 (two) times daily with a meal.    Take 237 mLs by mouth 3 (three)  times daily between meals.  Discontinued Medications   NYSTATIN (MYCOSTATIN) POWDER    Apply 1 g topically daily. Apply under breasts and under stomach fold.   OXYCODONE-ACETAMINOPHEN (PERCOCET) 10-325 MG PER TABLET    Take one tablet by mouth every 4 hours as needed for neck,back,shoulder and knee pain; Take one tablet by mouth every night at bedtime     Physical Exam: Filed Vitals:   05/23/15 1147  BP: 137/71  Pulse: 68  Temp: 95.6 F (35.3 C)  TempSrc: Oral  Resp: 18  Height: 5\' 5"  (1.651 m)  Weight: 170 lb (77.111 kg)    Physical Exam  Constitutional: She appears well-developed and well-nourished. No distress.  HENT:  Head: Normocephalic and atraumatic.  Cardiovascular: Normal rate, regular rhythm and normal heart sounds.   Pulmonary/Chest: Effort normal and breath sounds normal.  Abdominal: Soft. Bowel sounds are normal.  Musculoskeletal: She exhibits no edema or tenderness.  Neurological: She is alert.  Skin: Skin is warm and dry. She is not diaphoretic.  Psychiatric: She is agitated. Cognition and memory are impaired. She exhibits abnormal recent memory and abnormal remote memory.    Labs reviewed: Basic Metabolic Panel:  Recent Labs  08/22/14 0928 09/02/14 0830  11/25/14 0606 01/29/15 03/13/15 03/27/15  NA 143 148*  < > 144 143 141 138  K 4.4 4.1  < > 4.1 4.0 4.2 4.6  CL 104 109*  --  108  --   --   --   CO2 26 37*  --  25  --   --   --   GLUCOSE 137* 141*  --  145*  --   --   --   BUN 24* 60*  < > 33* 30* 34* 35*  CREATININE 1.18* 1.21  < > 1.39* 1.3* 1.4* 1.2*  CALCIUM 9.1 8.6  --  9.2  --   --   --   < > = values in this interval not displayed. Liver Function Tests:  Recent Labs  07/10/14 1234 08/21/14 1500 03/27/15  AST 13 13 15   ALT 8 9 16   ALKPHOS 46 45 43  BILITOT 0.4 0.3  --   PROT 6.6 6.4  --   ALBUMIN 3.5 3.4*  --    No results for input(s): LIPASE, AMYLASE in the last 8760 hours. No results for input(s): AMMONIA in the last 8760  hours. CBC:  Recent Labs  07/10/14 1234 08/21/14 1500 08/22/14 0928 11/25/14 0606 03/13/15 03/27/15  WBC 7.6 10.9* 6.6 10.7* 7.9 7.6  NEUTROABS 5.5 8.5*  --   --   --   --   HGB 11.9* 11.1* 10.5* 9.5* 10.5* 10.2*  HCT 37.3 33.8* 32.7* 29.0* 32* 31*  MCV 90.1 91.6 91.9 90.6  --   --   PLT 221 227 209 280 254 241   TSH: No results for input(s): TSH in the last 8760 hours. A1C: Lab Results  Component Value Date   HGBA1C 6.9* 08/27/2012   Lipid Panel: No results for input(s): CHOL, HDL, LDLCALC, TRIG, CHOLHDL, LDLDIRECT in the last 8760 hours.   Assessment/Plan 1. DEGENERATIVE DISC DISEASE, CERVICAL SPINE -pain stable at this time, cont on oxycdone-APAP and tylenol PRN - acetaminophen (TYLENOL) 325 MG tablet; Take 2 tablets (650 mg total) by mouth every 6 (six) hours as needed.  Dispense: 120 tablet; Refill: 11  2. CKD (chronic kidney disease), unspecified stage BUN/Cr reviewed from may 2016, labs stable at this time  3. Essential hypertension -HTN stable, not currently on medications, will monitor  4. Dementia with behavioral disturbance -behaviors worsening over the last few days, pacing the hallways Will get UA C&S at this time, may need further workup if this is negative  -pt conts on aricept and was started on namenda in May -also on Risperdal twice daily  5. Anemia, unspecified anemia type Stable on recent labs, conts on iron twice daily  6. Urinary incontinence, unspecified incontinence type -ongoing, taking mirabetriq daily      Marrell Dicaprio K. Harle Battiest  [facility] [phone] 8 am - 5 pm) [phone] (after hours)

## 2015-05-28 ENCOUNTER — Non-Acute Institutional Stay (SKILLED_NURSING_FACILITY): Payer: Medicare Other | Admitting: Nurse Practitioner

## 2015-05-28 DIAGNOSIS — L03114 Cellulitis of left upper limb: Secondary | ICD-10-CM | POA: Diagnosis not present

## 2015-05-28 DIAGNOSIS — N390 Urinary tract infection, site not specified: Secondary | ICD-10-CM

## 2015-05-28 DIAGNOSIS — F0391 Unspecified dementia with behavioral disturbance: Secondary | ICD-10-CM

## 2015-05-28 DIAGNOSIS — F03918 Unspecified dementia, unspecified severity, with other behavioral disturbance: Secondary | ICD-10-CM

## 2015-05-28 NOTE — Progress Notes (Signed)
Patient ID: NURIYA STUCK, female   DOB: Jun 10, 1930, 81 y.o.   MRN: [id number]    Nursing Home Location:  [place] of Service: SNF (31)  PCP: Leonides Sake, MD  Allergies  Allergen Reactions  . Atorvastatin Other (See Comments)    REACTION: increased CPK  . Cephalexin Nausea Only and Other (See Comments)    Weak, Headache, has tolerated ceftriaxone  . Cetirizine Hcl Other (See Comments)    REACTION: makes her head feel big  . Cortisone Other (See Comments)    REACTION: increased bp  . Ezetimibe-Simvastatin Other (See Comments)    unknown  . Fentanyl Other (See Comments)    REACTION: stinging in her skin  . Guaifenesin Other (See Comments)    unknown  . Ketorolac Tromethamine     REACTION: no work  . Lisinopril Cough    ACE related  . Metformin Other (See Comments)    epistaxis  . Nsaids Other (See Comments)    epistaxis  . Penicillins Nausea Only and Other (See Comments)    Headache, has tolerated Zosyn  . Rofecoxib Itching  . Rosuvastatin Other (See Comments)    Myalgias  . Simvastatin Other (See Comments)    unknown  . Spironolactone Nausea Only  . Sulfamethoxazole-Trimethoprim Other (See Comments)    unknown  . Codeine Rash  . Tape Rash    Chief Complaint  Patient presents with  . Acute Visit    HPI:  Patient is a 81 y.o. female seen today at [facility] for acute visit due to redness to left arm after skin tear. Pt with a PMH of dementia, seizure disorder, UI, depression, osteoporosis.  Pt with ongoing behaviors noted. Urine has resulted which shows 70,000 colonies positive for group b strep.  Pt also with left arm skin tear, treatment nurse is following and has noted worsening redness that has extended to elbow. Pt with advanced dementia and unable to contribute to ROS or HPI, resting during visit with little agitation noted.  Review of Systems:  Review of Systems  Unable to perform ROS: Dementia     Past Medical History  Diagnosis Date  . Allergy     allergic rhinitis  . Diabetes mellitus     type II  . Hypertension   . Osteopenia   . TIA (transient ischemic attack)   . Contact dermatitis     on buttocks  . Nosebleed   . Anxiety   . Renal insufficiency   . Pyelonephritis   . Dementia   . Chiari malformation   . Seizure disorder   . ROTATOR CUFF TEAR 10/13/2007    Qualifier: Diagnosis of  By: Marcelino Scot CMA, [place] Bilberry     Past Surgical History  Procedure Laterality Date  . Rotator cuff repair  06/2007    right  . Abdominal hysterectomy    . Back surgery  03/1999    disk  . Breast surgery      breast biopsy x2 negative  . Shoulder surgery  2007    left  . Joint replacement      Rt knee patella replacement  . Colonoscopy  08/28/2012    Procedure: COLONOSCOPY;  Surgeon: Beryle Beams, MD;  Location: [facility] ENDOSCOPY;  Service: Endoscopy;  Laterality: N/A;   Social History:   reports that she has never smoked. She has never used smokeless tobacco. She reports that she does not drink alcohol or use illicit drugs.  Family History  Problem Relation Age of Onset  . Stroke Mother   . Stroke Father     Medications: Patient's Medications  New Prescriptions   No medications on file  Previous Medications   ACETAMINOPHEN (TYLENOL) 325 MG TABLET    Take 2 tablets (650 mg total) by mouth every 6 (six) hours as needed.   ALENDRONATE (FOSAMAX) 70 MG TABLET    Take 70 mg by mouth every 7 (seven) days. Saturday. Take with a full glass of water on an empty stomach.   ALUM & MAG HYDROXIDE-SIMETH (MAALOX/MYLANTA) 200-200-20 MG/5ML SUSPENSION    Take 30 mLs by mouth every 6 (six) hours as needed for indigestion or heartburn (dyspepsia).   CALCIUM-VITAMIN D (OSCAL-500) 500-400 MG-UNIT PER TABLET    Take 1 tablet by mouth 2 (two) times daily.   CRANBERRY 250 MG TABS    Take 1 tablet (250 mg total) by mouth daily.   DONEPEZIL (ARICEPT) 10 MG TABLET    Take 20 mg by mouth at  bedtime.    ESCITALOPRAM (LEXAPRO) 10 MG TABLET    Take 10 mg by mouth daily.   FEEDING SUPPLEMENT, GLUCERNA SHAKE, (GLUCERNA SHAKE) LIQD    Take 237 mLs by mouth 2 (two) times daily with a meal.   FERROUS SULFATE 325 (65 FE) MG TABLET    Take 325 mg by mouth 2 (two) times daily with a meal.   FUROSEMIDE (LASIX) 20 MG TABLET    Take 10 mg by mouth daily.   LEVETIRACETAM (KEPPRA) 500 MG TABLET    Take 1 tablet (500 mg total) by mouth 2 (two) times daily.   LORAZEPAM (ATIVAN) 0.5 MG TABLET    Take 0.25 mg by mouth every 8 (eight) hours as needed for anxiety.   MEMANTINE (NAMENDA XR) 14 MG CP24 24 HR CAPSULE    Take 14 mg by mouth daily.   METHOCARBAMOL (ROBAXIN) 500 MG TABLET    Take 500 mg by mouth 3 (three) times daily as needed.    MIRABEGRON ER (MYRBETRIQ) 25 MG TB24 TABLET    Take 25 mg by mouth daily.   ONDANSETRON (ZOFRAN) 4 MG TABLET    Take 4 mg by mouth every 8 (eight) hours as needed for nausea or vomiting (nausea).    OXYCODONE-ACETAMINOPHEN (PERCOCET) 10-325 MG PER TABLET    Take one tablet by mouth every 4 hours as needed for neck, back, shoulder and knee pain; Take one tablet by mouth every night at bedtime for pain. Do not exceed 4gm of Tylenol in 24 hours.   OXYGEN    Inhale 3 L into the lungs continuous.   POLYETHYLENE GLYCOL (MIRALAX / GLYCOLAX) PACKET    Take 17 g by mouth 2 (two) times daily.   RISPERIDONE (RISPERDAL) 0.25 MG TABLET    Take 0.25-0.5 mg by mouth 2 (two) times daily. 0.25mg  daily qam and 0.5mg  qhs   SENNA (SENOKOT) 8.6 MG TABS TABLET    Take 1 tablet by mouth daily as needed for mild constipation (constipation).    TRAZODONE (DESYREL) 50 MG TABLET    Take 50 mg by mouth at bedtime.  Modified Medications   No medications on file  Discontinued Medications   No medications on file     Physical Exam: Filed Vitals:   05/28/15 1502  BP: 100/81  Pulse: 80  Temp: 97.4 F (36.3 C)  Resp: 16    Physical Exam  Constitutional: She appears well-developed and  well-nourished. No distress.  HENT:  Head: Normocephalic and atraumatic.  Cardiovascular: Normal rate, regular rhythm and normal heart sounds.   Pulmonary/Chest: Effort normal and breath sounds normal.  Abdominal: Soft. Bowel sounds are normal.  Musculoskeletal: She exhibits no edema or tenderness.  Neurological: She is alert.  Skin: Skin is warm and dry. She is not diaphoretic. There is erythema (noted to right arm).  Psychiatric: Cognition and memory are impaired. She exhibits abnormal recent memory and abnormal remote memory.    Labs reviewed: Basic Metabolic Panel:  Recent Labs  08/22/14 0928 09/02/14 0830  11/25/14 0606 01/29/15 03/13/15 03/27/15  NA 143 148*  < > 144 143 141 138  K 4.4 4.1  < > 4.1 4.0 4.2 4.6  CL 104 109*  --  108  --   --   --   CO2 26 37*  --  25  --   --   --   GLUCOSE 137* 141*  --  145*  --   --   --   BUN 24* 60*  < > 33* 30* 34* 35*  CREATININE 1.18* 1.21  < > 1.39* 1.3* 1.4* 1.2*  CALCIUM 9.1 8.6  --  9.2  --   --   --   < > = values in this interval not displayed. Liver Function Tests:  Recent Labs  07/10/14 1234 08/21/14 1500 03/27/15  AST 13 13 15   ALT 8 9 16   ALKPHOS 46 45 43  BILITOT 0.4 0.3  --   PROT 6.6 6.4  --   ALBUMIN 3.5 3.4*  --    No results for input(s): LIPASE, AMYLASE in the last 8760 hours. No results for input(s): AMMONIA in the last 8760 hours. CBC:  Recent Labs  07/10/14 1234 08/21/14 1500 08/22/14 0928 11/25/14 0606 03/13/15 03/27/15  WBC 7.6 10.9* 6.6 10.7* 7.9 7.6  NEUTROABS 5.5 8.5*  --   --   --   --   HGB 11.9* 11.1* 10.5* 9.5* 10.5* 10.2*  HCT 37.3 33.8* 32.7* 29.0* 32* 31*  MCV 90.1 91.6 91.9 90.6  --   --   PLT 221 227 209 280 254 241   TSH: No results for input(s): TSH in the last 8760 hours. A1C: Lab Results  Component Value Date   HGBA1C 6.9* 08/27/2012   Lipid Panel: No results for input(s): CHOL, HDL, LDLCALC, TRIG, CHOLHDL, LDLDIRECT in the last 8760 hours.   Assessment/Plan 1.  UTI (lower urinary tract infection) -will treat with augmentin 875-125 mg twice daily for 1 week   2. Cellulitis of left upper extremity -treatment nurse following skin tear which has causes secondary cellulitis. Will treat with Augmentin to cover urine and skin, if not responding to antibiotic may need to add additional coverage. Staff to provide close monitoring  3. Dementia with behavioral disturbance Worsening behaviors may be related to acute infection. Psych following at this time.  Carlos American. Harle Battiest  [facility] [phone] 8 am - 5 pm) [phone] (after hours)

## 2015-06-07 ENCOUNTER — Encounter: Payer: Self-pay | Admitting: Internal Medicine

## 2015-06-07 ENCOUNTER — Non-Acute Institutional Stay (SKILLED_NURSING_FACILITY): Payer: Medicare Other | Admitting: Internal Medicine

## 2015-06-07 DIAGNOSIS — F03918 Unspecified dementia, unspecified severity, with other behavioral disturbance: Secondary | ICD-10-CM

## 2015-06-07 DIAGNOSIS — R410 Disorientation, unspecified: Secondary | ICD-10-CM | POA: Diagnosis not present

## 2015-06-07 DIAGNOSIS — G40909 Epilepsy, unspecified, not intractable, without status epilepticus: Secondary | ICD-10-CM | POA: Diagnosis not present

## 2015-06-07 DIAGNOSIS — F0393 Unspecified dementia, unspecified severity, with mood disturbance: Secondary | ICD-10-CM | POA: Insufficient documentation

## 2015-06-07 DIAGNOSIS — F329 Major depressive disorder, single episode, unspecified: Secondary | ICD-10-CM | POA: Diagnosis not present

## 2015-06-07 DIAGNOSIS — F0280 Dementia in other diseases classified elsewhere without behavioral disturbance: Secondary | ICD-10-CM | POA: Diagnosis not present

## 2015-06-07 DIAGNOSIS — F0391 Unspecified dementia with behavioral disturbance: Secondary | ICD-10-CM

## 2015-06-07 NOTE — Progress Notes (Signed)
Patient ID: Linda [name], female   DOB: 1929-11-16, 81 y.o.   MRN: [id number]   [facility]  Code Status: DNR   Allergies  Allergen Reactions  . Atorvastatin Other (See Comments)    REACTION: increased CPK  . Cephalexin Nausea Only and Other (See Comments)    Weak, Headache, has tolerated ceftriaxone  . Cetirizine Hcl Other (See Comments)    REACTION: makes her head feel big  . Cortisone Other (See Comments)    REACTION: increased bp  . Ezetimibe-Simvastatin Other (See Comments)    unknown  . Fentanyl Other (See Comments)    REACTION: stinging in her skin  . Guaifenesin Other (See Comments)    unknown  . Ketorolac Tromethamine     REACTION: no work  . Lisinopril Cough    ACE related  . Metformin Other (See Comments)    epistaxis  . Nsaids Other (See Comments)    epistaxis  . Penicillins Nausea Only and Other (See Comments)    Headache, has tolerated Zosyn  . Rofecoxib Itching  . Rosuvastatin Other (See Comments)    Myalgias  . Simvastatin Other (See Comments)    unknown  . Spironolactone Nausea Only  . Sulfamethoxazole-Trimethoprim Other (See Comments)    unknown  . Codeine Rash  . Tape Rash    Chief Complaint  Patient presents with  . Acute Visit    Increased agitation    HPI 81 y/o female patient is seen today with acute concerns from staff. She has been more agitated recently. She has had violent behavior where she has been agitated and throwing things at staff. She was recently treated for uti with doxycycline and completed treatment 06/06/15. She has PMH of dementia, seizure disorder, depression among others. She is on several mood/ behavior meds on review. No recent falls reported. Unable to obtain HPI or ROS from patient with her dementia. Calm this visit and somnolent during exam.  ROS Unable to obtain  Past Medical History  Diagnosis Date  . Allergy     allergic rhinitis  . Diabetes mellitus     type II  . Hypertension   .  Osteopenia   . TIA (transient ischemic attack)   . Contact dermatitis     on buttocks  . Nosebleed   . Anxiety   . Renal insufficiency   . Pyelonephritis   . Dementia   . Chiari malformation   . Seizure disorder   . ROTATOR CUFF TEAR 10/13/2007    Qualifier: Diagnosis of  By: Marcelino Scot CMA, [place] Bilberry         Medication List       This list is accurate as of: 06/07/15  8:57 AM.  Always use your most recent med list.               acetaminophen 325 MG tablet  Commonly known as:  TYLENOL  Take 650 mg by mouth every 6 (six) hours as needed (2 by mouth every morning prior to therapy).     alendronate 70 MG tablet  Commonly known as:  FOSAMAX  Take 70 mg by mouth every 7 (seven) days. Saturday. Take with a full glass of water on an empty stomach.     alum & mag hydroxide-simeth 200-200-20 MG/5ML suspension  Commonly known as:  MAALOX/MYLANTA  Take 30 mLs by mouth every 6 (six) hours as needed for indigestion or heartburn (dyspepsia).     Calcium-D 600-400 MG-UNIT Tabs  Take by  mouth 2 (two) times daily. For Osteoporosis     Cranberry 250 MG Tabs  Take 1 tablet (250 mg total) by mouth daily.     donepezil 10 MG tablet  Commonly known as:  ARICEPT  Take 20 mg by mouth at bedtime.     escitalopram 10 MG tablet  Commonly known as:  LEXAPRO  Take 10 mg by mouth daily.     feeding supplement (GLUCERNA SHAKE) Liqd  Take 237 mLs by mouth 2 (two) times daily with a meal.     ferrous sulfate 325 (65 FE) MG tablet  Take 325 mg by mouth 2 (two) times daily with a meal.     furosemide 20 MG tablet  Commonly known as:  LASIX  Take 10 mg by mouth daily.     levETIRAcetam 500 MG tablet  Commonly known as:  KEPPRA  Take 1 tablet (500 mg total) by mouth 2 (two) times daily.     LORazepam 0.5 MG tablet  Commonly known as:  ATIVAN  Take 0.25 mg by mouth every 8 (eight) hours as needed for anxiety (1 by mouth scheduled on Saturday and Sunday).     memantine 14 MG Cp24 24 hr  capsule  Commonly known as:  NAMENDA XR  Take 14 mg by mouth daily.     methocarbamol 500 MG tablet  Commonly known as:  ROBAXIN  Take 500 mg by mouth 3 (three) times daily as needed.     MYRBETRIQ 25 MG Tb24 tablet  Generic drug:  mirabegron ER  Take 25 mg by mouth daily.     ondansetron 4 MG tablet  Commonly known as:  ZOFRAN  Take 4 mg by mouth every 8 (eight) hours as needed for nausea or vomiting (nausea).     oxyCODONE-acetaminophen 10-325 MG per tablet  Commonly known as:  PERCOCET  Take one tablet by mouth every 4 hours as needed for neck, back, shoulder and knee pain; Take one tablet by mouth every night at bedtime for pain. Do not exceed 4gm of Tylenol in 24 hours.     OXYGEN  Inhale 3 L into the lungs continuous.     polyethylene glycol packet  Commonly known as:  MIRALAX / GLYCOLAX  Take 17 g by mouth 2 (two) times daily.     risperiDONE 0.25 MG tablet  Commonly known as:  RISPERDAL  3 by mouth daily at bedtime     senna 8.6 MG Tabs tablet  Commonly known as:  SENOKOT  Take 1 tablet by mouth 2 (two) times daily.     traZODone 50 MG tablet  Commonly known as:  DESYREL  Take 50 mg by mouth at bedtime.        Physical exam BP 102/59 mmHg  Pulse 68  Temp(Src) 98.5 F (36.9 C) (Oral)  Resp 20  Ht 5\' 5"  (1.651 m)  Wt 170 lb (77.111 kg)  BMI 28.29 kg/m2  SpO2 97%   Constitutional: She appears well-developed and well-nourished. No distress.  HENT:  Head: Normocephalic and atraumatic.  Cardiovascular: Normal rate, regular rhythm and normal heart sounds.   Pulmonary/Chest: Effort normal and breath sounds normal.  Abdominal: Soft. Bowel sounds are normal.  Musculoskeletal: She exhibits no edema or tenderness.  Neurological: She is alert.  Skin: Skin is warm and dry. She is not diaphoretic.  Psychiatric: She is calm this visit, has impaired cognition, poor insight, difficulty following commands  Labs CBC Latest Ref Rng 03/27/2015 03/13/2015 11/25/2014  WBC - 7.6 7.9 10.7(H)  Hemoglobin 12.0 - 16.0 g/dL 10.2(A) 10.5(A) 9.5(L)  Hematocrit 36 - 46 % 31(A) 32(A) 29.0(L)  Platelets 150 - 399 K/L 241 254 280   CMP Latest Ref Rng 03/27/2015 03/13/2015 01/29/2015  Glucose 70 - 99 mg/dL - - -  BUN 4 - 21 mg/dL 35(A) 34(A) 30(A)  Creatinine 0.5 - 1.1 mg/dL 1.2(A) 1.4(A) 1.3(A)  Sodium 137 - 147 mmol/L 138 141 143  Potassium 3.4 - 5.3 mmol/L 4.6 4.2 4.0  Chloride 96 - 112 mEq/L - - -  CO2 19 - 32 mmol/L - - -  Calcium 8.4 - 10.5 mg/dL - - -  Total Protein 6.0 - 8.3 g/dL - - -  Total Bilirubin 0.3 - 1.2 mg/dL - - -  Alkaline Phos 25 - 125 U/L 43 - -  AST 13 - 35 U/L 15 - -  ALT 7 - 35 U/L 16 - -    Assessment/plan  Delirium Pt has history of dementia with behavioral disturbance but over last 2 days it has escalated. Recently treated for UTI. Clinical exam shows no signs of infection. Concern of progressive dementia and multiple psych meds contributing to this. Check cbc with diff, tsh and cmp to rule out metabolic abnormality/ infection. Change risperdal to 0.5 mg qhs for now for 3 days, then 0.25 mg qhs for 3 days then stop. decrease trazodone to 25 mg daily for a week and then stop. Add seroquel 12.5 mg qhs x 3 days, then increase to 25 mg qhs for now. Avoid sedative/ hypnotic agent for now. Fall precautions.  Dementia with behavioral disturbance Currently on aricept 20 mg daily with namenda xr 14 mg daily, risperdal 0.75 mg qhs since 05/28/15.decrease aricpet to 10 mg daily for now. See above. To give ativan prn only for severe anxiety.  Depression with dementia On lexapro 10 mg daily and trazodone 50 mg daily. Taper and take her off trazodone  Seizure disorder Remains seizure free, on keppra 500 mg bid. Check keppra level   Blanchie Serve, MD  [place] (Monday-Friday 8 am - 5 pm) [phone] (afterhours)

## 2015-06-12 ENCOUNTER — Other Ambulatory Visit: Payer: Self-pay | Admitting: *Deleted

## 2015-06-12 MED ORDER — OXYCODONE-ACETAMINOPHEN 10-325 MG PO TABS: ORAL_TABLET | ORAL | Status: DC

## 2015-06-12 NOTE — Telephone Encounter (Signed)
[facility] 

## 2015-06-13 LAB — HEPATIC FUNCTION PANEL
ALK PHOS: 49 U/L (ref 25–125)
ALT: 10 U/L (ref 7–35)
AST: 12 U/L — AB (ref 13–35)
BILIRUBIN, TOTAL: 0.3 mg/dL

## 2015-06-13 LAB — TSH: TSH: 3.03 u[IU]/mL (ref 0.41–5.90)

## 2015-06-13 LAB — CBC AND DIFFERENTIAL
HEMATOCRIT: 31 % — AB (ref 36–46)
HEMOGLOBIN: 10.6 g/dL — AB (ref 12.0–16.0)
Platelets: 267 10*3/uL (ref 150–399)
WBC: 7.8 10^3/mL

## 2015-06-13 LAB — BASIC METABOLIC PANEL
BUN: 46 mg/dL — AB (ref 4–21)
Creatinine: 1.7 mg/dL — AB (ref 0.5–1.1)
GLUCOSE: 108 mg/dL
Potassium: 4.1 mmol/L (ref 3.4–5.3)
SODIUM: 143 mmol/L (ref 137–147)

## 2015-06-19 ENCOUNTER — Non-Acute Institutional Stay (SKILLED_NURSING_FACILITY): Payer: Medicare Other | Admitting: Internal Medicine

## 2015-06-19 DIAGNOSIS — F0392 Unspecified dementia, unspecified severity, with psychotic disturbance: Secondary | ICD-10-CM

## 2015-06-19 DIAGNOSIS — F0391 Unspecified dementia with behavioral disturbance: Secondary | ICD-10-CM

## 2015-06-19 NOTE — Progress Notes (Signed)
Patient ID: Linda [name], female   DOB: [birth year], 81 y.o.   MRN: [id number]      [facility]  Code Status: DNR   Allergies  Allergen Reactions  . Atorvastatin Other (See Comments)    REACTION: increased CPK  . Cephalexin Nausea Only and Other (See Comments)    Weak, Headache, has tolerated ceftriaxone  . Cetirizine Hcl Other (See Comments)    REACTION: makes her head feel big  . Cortisone Other (See Comments)    REACTION: increased bp  . Ezetimibe-Simvastatin Other (See Comments)    unknown  . Fentanyl Other (See Comments)    REACTION: stinging in her skin  . Guaifenesin Other (See Comments)    unknown  . Ketorolac Tromethamine     REACTION: no work  . Lisinopril Cough    ACE related  . Metformin Other (See Comments)    epistaxis  . Nsaids Other (See Comments)    epistaxis  . Penicillins Nausea Only and Other (See Comments)    Headache, has tolerated Zosyn  . Rofecoxib Itching  . Rosuvastatin Other (See Comments)    Myalgias  . Simvastatin Other (See Comments)    unknown  . Spironolactone Nausea Only  . Sulfamethoxazole-Trimethoprim Other (See Comments)    unknown  . Codeine Rash  . Tape Rash    Chief Complaint  Patient presents with  . Acute Visit    agitation, restlessness, not sleeping at night   HPI 81 y/o female patient is seen today with acute concerns. Her agitation had subsided some with change in medication but staff have noted her to be more awake at night, taking naps during day and being agitated, pacing through hallway at night time. She has PMH of dementia, seizure disorder, depression among others. Unable to obtain HPI or ROS from patient with her dementia. As per staff no fever or chills reported. No falls or new skin concerns.   ROS Unable to obtain  Past Medical History  Diagnosis Date  . Allergy     allergic rhinitis  . Diabetes mellitus     type II  . Hypertension   . Osteopenia   . TIA (transient ischemic  attack)   . Contact dermatitis     on buttocks  . Nosebleed   . Anxiety   . Renal insufficiency   . Pyelonephritis   . Dementia   . Chiari malformation   . Seizure disorder   . ROTATOR CUFF TEAR 10/13/2007    Qualifier: Diagnosis of  By: Marcelino Scot CMA, [place] Bilberry         Medication List       This list is accurate as of: 06/19/15  1:38 PM.  Always use your most recent med list.               acetaminophen 325 MG tablet  Commonly known as:  TYLENOL  Take 650 mg by mouth every 6 (six) hours as needed (2 by mouth every morning prior to therapy).     alendronate 70 MG tablet  Commonly known as:  FOSAMAX  Take 70 mg by mouth every 7 (seven) days. Saturday. Take with a full glass of water on an empty stomach.     alum & mag hydroxide-simeth 200-200-20 MG/5ML suspension  Commonly known as:  MAALOX/MYLANTA  Take 30 mLs by mouth every 6 (six) hours as needed for indigestion or heartburn (dyspepsia).     Calcium-D 600-400 MG-UNIT Tabs  Take by mouth  2 (two) times daily. For Osteoporosis     Cranberry 250 MG Tabs  Take 1 tablet (250 mg total) by mouth daily.     donepezil 10 MG tablet  Commonly known as:  ARICEPT  Take 20 mg by mouth at bedtime.     escitalopram 10 MG tablet  Commonly known as:  LEXAPRO  Take 10 mg by mouth daily.     feeding supplement (GLUCERNA SHAKE) Liqd  Take 237 mLs by mouth 2 (two) times daily with a meal.     ferrous sulfate 325 (65 FE) MG tablet  Take 325 mg by mouth 2 (two) times daily with a meal.     furosemide 20 MG tablet  Commonly known as:  LASIX  Take 10 mg by mouth daily.     levETIRAcetam 500 MG tablet  Commonly known as:  KEPPRA  Take 1 tablet (500 mg total) by mouth 2 (two) times daily.     LORazepam 0.5 MG tablet  Commonly known as:  ATIVAN  Take 0.25 mg by mouth every 8 (eight) hours as needed for anxiety (1 by mouth scheduled on Saturday and Sunday).     Melatonin 3 MG Caps  Take 3 mg by mouth daily.     memantine 14  MG Cp24 24 hr capsule  Commonly known as:  NAMENDA XR  Take 14 mg by mouth daily.     methocarbamol 500 MG tablet  Commonly known as:  ROBAXIN  Take 500 mg by mouth 3 (three) times daily as needed.     MYRBETRIQ 25 MG Tb24 tablet  Generic drug:  mirabegron ER  Take 25 mg by mouth daily.     ondansetron 4 MG tablet  Commonly known as:  ZOFRAN  Take 4 mg by mouth every 8 (eight) hours as needed for nausea or vomiting (nausea).     oxyCODONE-acetaminophen 10-325 MG per tablet  Commonly known as:  PERCOCET  Take one tablet by mouth every night at bedtime for pain. Take one tablet by mouth every 4 hours as needed for pain.  Do not exceed 4gm of Tylenol in 24hr     OXYGEN  Inhale 3 L into the lungs continuous.     polyethylene glycol packet  Commonly known as:  MIRALAX / GLYCOLAX  Take 17 g by mouth 2 (two) times daily.     QUEtiapine 25 MG tablet  Commonly known as:  SEROQUEL  Take 25 mg by mouth at bedtime.     senna 8.6 MG Tabs tablet  Commonly known as:  SENOKOT  Take 1 tablet by mouth 2 (two) times daily.        Physical exam BP 125/60 mmHg  Pulse 60  Temp(Src) 97 F (36.1 C)  Resp 18  SpO2 95%   Constitutional: elderly female in no distress HENT:  Head: Normocephalic and atraumatic.  Cardiovascular: Normal rate, regular rhythm and normal heart sounds.   Pulmonary/Chest: Effort normal and breath sounds normal.  Abdominal: Soft. Bowel sounds are normal.  Musculoskeletal: She exhibits no edema or tenderness.  Neurological: She is alert.  Skin: Skin is warm and dry. She is not diaphoretic.  Psychiatric: impaired cognition, poor insight, difficulty following commands  Labs CBC Latest Ref Rng 03/27/2015 03/13/2015 11/25/2014  WBC - 7.6 7.9 10.7(H)  Hemoglobin 12.0 - 16.0 g/dL 10.2(A) 10.5(A) 9.5(L)  Hematocrit 36 - 46 % 31(A) 32(A) 29.0(L)  Platelets 150 - 399 K/L 241 254 280   CMP Latest Ref Rng 03/27/2015 03/13/2015  01/29/2015  Glucose 70 - 99 mg/dL - - -  BUN  4 - 21 mg/dL 35(A) 34(A) 30(A)  Creatinine 0.5 - 1.1 mg/dL 1.2(A) 1.4(A) 1.3(A)  Sodium 137 - 147 mmol/L 138 141 143  Potassium 3.4 - 5.3 mmol/L 4.6 4.2 4.0  Chloride 96 - 112 mEq/L - - -  CO2 19 - 32 mmol/L - - -  Calcium 8.4 - 10.5 mg/dL - - -  Total Protein 6.0 - 8.3 g/dL - - -  Total Bilirubin 0.3 - 1.2 mg/dL - - -  Alkaline Phos 25 - 125 U/L 43 - -  AST 13 - 35 U/L 15 - -  ALT 7 - 35 U/L 16 - -   06/11/15 wbc 7.8, hb 10.6, hct 31.2, plt 267, na 143, k 4.1, bun 46, cr 1.7, tsh 3.03  Assessment/plan  Psychosis with dementia With her dementia and its progression, decline and worsening anticipated. Change her seroquel to 25 mg bid for now. Infection workup has been negative. Will get psychology to see her. Also increase her lexapro to 20 mg daily for now. If this does not help, consider treatment with nuedexta. With her refusing her medication at times, continue her namenda and aricept for now   [facility], MD  [address] [facility] [phone] (Monday-Friday 8 am - 5 pm) [phone] (afterhours)

## 2015-06-21 ENCOUNTER — Non-Acute Institutional Stay (SKILLED_NURSING_FACILITY): Payer: Medicare Other | Admitting: Internal Medicine

## 2015-06-21 DIAGNOSIS — G4710 Hypersomnia, unspecified: Secondary | ICD-10-CM | POA: Diagnosis not present

## 2015-06-21 DIAGNOSIS — F0391 Unspecified dementia with behavioral disturbance: Secondary | ICD-10-CM

## 2015-06-21 DIAGNOSIS — F03918 Unspecified dementia, unspecified severity, with other behavioral disturbance: Secondary | ICD-10-CM

## 2015-06-21 NOTE — Progress Notes (Signed)
Patient ID: Linda [name], female   DOB: 1930-03-22, 81 y.o.   MRN: [id number]     [facility]  Code Status: DNR   Allergies  Allergen Reactions  . Atorvastatin Other (See Comments)    REACTION: increased CPK  . Cephalexin Nausea Only and Other (See Comments)    Weak, Headache, has tolerated ceftriaxone  . Cetirizine Hcl Other (See Comments)    REACTION: makes her head feel big  . Cortisone Other (See Comments)    REACTION: increased bp  . Ezetimibe-Simvastatin Other (See Comments)    unknown  . Fentanyl Other (See Comments)    REACTION: stinging in her skin  . Guaifenesin Other (See Comments)    unknown  . Ketorolac Tromethamine     REACTION: no work  . Lisinopril Cough    ACE related  . Metformin Other (See Comments)    epistaxis  . Nsaids Other (See Comments)    epistaxis  . Penicillins Nausea Only and Other (See Comments)    Headache, has tolerated Zosyn  . Rofecoxib Itching  . Rosuvastatin Other (See Comments)    Myalgias  . Simvastatin Other (See Comments)    unknown  . Spironolactone Nausea Only  . Sulfamethoxazole-Trimethoprim Other (See Comments)    unknown  . Codeine Rash  . Tape Rash    Chief Complaint  Patient presents with  . Acute Visit    behavioral changes   HPI 81 y/o female patient is seen today for ongoing concerns. Her agitation had subsided some and she has been doing good for most part of the day since increase in seroquel. But there continues to be some agitation episode and she had a fall 2 days back with left forearm skin tear but no other apparent injury. She is seen in her room. Unable to obtain HPI or ROS from patient with her dementia.   ROS Unable to obtain  Past Medical History  Diagnosis Date  . Allergy     allergic rhinitis  . Diabetes mellitus     type II  . Hypertension   . Osteopenia   . TIA (transient ischemic attack)   . Contact dermatitis     on buttocks  . Nosebleed   . Anxiety   . Renal  insufficiency   . Pyelonephritis   . Dementia   . Chiari malformation   . Seizure disorder   . ROTATOR CUFF TEAR 10/13/2007    Qualifier: Diagnosis of  By: Marcelino Scot CMA, [place] Bilberry      Medication reviewed. See [facility]   Physical exam BP 144/58 mmHg  Pulse 73  Temp(Src) 97.7 F (36.5 C)  Resp 18  SpO2 95%  Constitutional: elderly female in no distress HENT:  Head: Normocephalic and atraumatic.  Cardiovascular: Normal rate, regular rhythm and normal heart sounds.   Pulmonary/Chest: Effort normal and breath sounds normal.  Abdominal: Soft. Bowel sounds are normal.  Musculoskeletal: She exhibits no edema or tenderness.  Neurological: She is alert.  Skin: Skin is warm and dry. She is not diaphoretic.  Psychiatric: impaired cognition, poor insight, difficulty following commands  Labs 06/11/15 wbc 7.8, hb 10.6, hct 31.2, plt 267, na 143, k 4.1, bun 46, cr 1.7, tsh 3.03  Assessment/plan  Dementia with behavioral disturbance Continue aricpet but decrease to 10 mg daily and namenda with lexapro for now  Excessive daytime sleepiness With her medication and progressive dementia with psychosis features. Currently on seroquel 25 mg bid, will change this to 50  mg at 5 pm to avoid daytime sleepiness.   Blanchie Serve, MD  [facility] [phone] (Monday-Friday 8 am - 5 pm) [phone] (afterhours)

## 2015-06-25 ENCOUNTER — Non-Acute Institutional Stay (SKILLED_NURSING_FACILITY): Payer: Medicare Other | Admitting: Internal Medicine

## 2015-06-25 DIAGNOSIS — F0391 Unspecified dementia with behavioral disturbance: Secondary | ICD-10-CM | POA: Diagnosis not present

## 2015-06-25 DIAGNOSIS — R400 Somnolence: Secondary | ICD-10-CM

## 2015-06-25 DIAGNOSIS — G4710 Hypersomnia, unspecified: Secondary | ICD-10-CM

## 2015-06-25 DIAGNOSIS — F03918 Unspecified dementia, unspecified severity, with other behavioral disturbance: Secondary | ICD-10-CM

## 2015-06-25 NOTE — Progress Notes (Signed)
Patient ID: Linda [name], female   DOB: December 03, 1929, 81 y.o.   MRN: [id number]     [facility]  Code Status: DNR   Allergies  Allergen Reactions  . Atorvastatin Other (See Comments)    REACTION: increased CPK  . Cephalexin Nausea Only and Other (See Comments)    Weak, Headache, has tolerated ceftriaxone  . Cetirizine Hcl Other (See Comments)    REACTION: makes her head feel big  . Cortisone Other (See Comments)    REACTION: increased bp  . Ezetimibe-Simvastatin Other (See Comments)    unknown  . Fentanyl Other (See Comments)    REACTION: stinging in her skin  . Guaifenesin Other (See Comments)    unknown  . Ketorolac Tromethamine     REACTION: no work  . Lisinopril Cough    ACE related  . Metformin Other (See Comments)    epistaxis  . Nsaids Other (See Comments)    epistaxis  . Penicillins Nausea Only and Other (See Comments)    Headache, has tolerated Zosyn  . Rofecoxib Itching  . Rosuvastatin Other (See Comments)    Myalgias  . Simvastatin Other (See Comments)    unknown  . Spironolactone Nausea Only  . Sulfamethoxazole-Trimethoprim Other (See Comments)    unknown  . Codeine Rash  . Tape Rash    Chief Complaint  Patient presents with  . Acute Visit    daytime sleepiness, awake and agitated at night   HPI 81 y/o female patient is seen today for concern of increased daytime sleepiness and agitation during night. She has advanced dementia with behavioral disturbance. Infectious etiology has been ruled out. She is seen in her room. Awake and calm but does not follow command. Unable to obtain HPI or ROS from patient with her dementia.  ROS Unable to obtain  Past Medical History  Diagnosis Date  . Allergy     allergic rhinitis  . Diabetes mellitus     type II  . Hypertension   . Osteopenia   . TIA (transient ischemic attack)   . Contact dermatitis     on buttocks  . Nosebleed   . Anxiety   . Renal insufficiency   . Pyelonephritis     . Dementia   . Chiari malformation   . Seizure disorder   . ROTATOR CUFF TEAR 10/13/2007    Qualifier: Diagnosis of  By: Marcelino Scot CMA, [place] Bilberry      Medication reviewed. See [facility]   Physical exam Afebrile, VSS  Constitutional: elderly female in no distress HENT:  Head: Normocephalic and atraumatic.  Cardiovascular: Normal rate, regular rhythm and normal heart sounds.   Pulmonary/Chest: Effort normal and breath sounds normal.  Abdominal: Soft. Bowel sounds are normal.  Musculoskeletal: She exhibits no edema or tenderness.  Neurological: alert but not oriented.  Skin: Skin is warm and dry. She is not diaphoretic.  Psychiatric: impaired cognition, poor insight, difficulty following commands  Labs 06/11/15 wbc 7.8, hb 10.6, hct 31.2, plt 267, na 143, k 4.1, bun 46, cr 1.7, tsh 3.03  Assessment/plan  Daytime somnolence Will try to schedule melatonin at bedtime to see if this will help regulate her sleep cycle. Change melatonin to 3 mg qhs at 8 pm and reassess. Also decrease frequency of seroquel as below  Dementia with behavioral disturbance Continue aricpet 10 mg daily and namenda with lexapro 10 mg daily. Decrease seroquel to 25 mg daily at 4 pm during onset of sundowning to see if this  will help. Spoke with daughter over the phone. Patient will need memory care unit. Daughter would like further workup. Will get neurology referral and psychiatry referral. Will hold off on risperdal as this made her excessively sleepy and confused in past   Blanchie Serve, MD  [place] (Monday-Friday 8 am - 5 pm) [phone] (afterhours)

## 2015-07-09 ENCOUNTER — Non-Acute Institutional Stay (SKILLED_NURSING_FACILITY): Payer: Medicare Other | Admitting: Nurse Practitioner

## 2015-07-09 DIAGNOSIS — F329 Major depressive disorder, single episode, unspecified: Secondary | ICD-10-CM | POA: Diagnosis not present

## 2015-07-09 DIAGNOSIS — N058 Unspecified nephritic syndrome with other morphologic changes: Secondary | ICD-10-CM

## 2015-07-09 DIAGNOSIS — I10 Essential (primary) hypertension: Secondary | ICD-10-CM

## 2015-07-09 DIAGNOSIS — F0391 Unspecified dementia with behavioral disturbance: Secondary | ICD-10-CM | POA: Diagnosis not present

## 2015-07-09 DIAGNOSIS — F0393 Unspecified dementia, unspecified severity, with mood disturbance: Secondary | ICD-10-CM

## 2015-07-09 DIAGNOSIS — F0280 Dementia in other diseases classified elsewhere without behavioral disturbance: Secondary | ICD-10-CM | POA: Diagnosis not present

## 2015-07-09 DIAGNOSIS — J449 Chronic obstructive pulmonary disease, unspecified: Secondary | ICD-10-CM | POA: Diagnosis not present

## 2015-07-09 DIAGNOSIS — D638 Anemia in other chronic diseases classified elsewhere: Secondary | ICD-10-CM | POA: Diagnosis not present

## 2015-07-09 DIAGNOSIS — M62838 Other muscle spasm: Secondary | ICD-10-CM | POA: Diagnosis not present

## 2015-07-09 DIAGNOSIS — E1129 Type 2 diabetes mellitus with other diabetic kidney complication: Secondary | ICD-10-CM | POA: Diagnosis not present

## 2015-07-09 DIAGNOSIS — F03918 Unspecified dementia, unspecified severity, with other behavioral disturbance: Secondary | ICD-10-CM

## 2015-07-09 NOTE — Progress Notes (Signed)
Patient ID: Linda [name], female   DOB: 01-Sep-1930, 81 y.o.   MRN: [id number]    Nursing Home Location:  [place] of Service: SNF (31)  PCP: Leonides Sake, MD  Allergies  Allergen Reactions  . Atorvastatin Other (See Comments)    REACTION: increased CPK  . Cephalexin Nausea Only and Other (See Comments)    Weak, Headache, has tolerated ceftriaxone  . Cetirizine Hcl Other (See Comments)    REACTION: makes her head feel big  . Cortisone Other (See Comments)    REACTION: increased bp  . Ezetimibe-Simvastatin Other (See Comments)    unknown  . Fentanyl Other (See Comments)    REACTION: stinging in her skin  . Guaifenesin Other (See Comments)    unknown  . Ketorolac Tromethamine     REACTION: no work  . Lisinopril Cough    ACE related  . Metformin Other (See Comments)    epistaxis  . Nsaids Other (See Comments)    epistaxis  . Penicillins Nausea Only and Other (See Comments)    Headache, has tolerated Zosyn  . Rofecoxib Itching  . Rosuvastatin Other (See Comments)    Myalgias  . Simvastatin Other (See Comments)    unknown  . Spironolactone Nausea Only  . Sulfamethoxazole-Trimethoprim Other (See Comments)    unknown  . Codeine Rash  . Tape Rash    Chief Complaint  Patient presents with  . Medical Management of Chronic Issues    HPI:  Patient is a 81 y.o. female seen today at [facility] for routine follow up on chronic conditions. Pt with a PMH of dementia, seizure disorder, UI, depression, osteoporosis.Nursing reports that Ms. Bolin is still not sleeping well at night and therefore is sleepy during the daytime.  Today she is not alert but arousal during exam then later seen up in the hallways.  She is non-verbal and does not follow simple commands. Nursing has no new concerns. Behavior are variable depending on the night, sleeping most days.  Review of Systems:  Review of Systems  Unable to perform ROS: Dementia     Past Medical History  Diagnosis Date  . Allergy     allergic rhinitis  . Diabetes mellitus     type II  . Hypertension   . Osteopenia   . TIA (transient ischemic attack)   . Contact dermatitis     on buttocks  . Nosebleed   . Anxiety   . Renal insufficiency   . Pyelonephritis   . Dementia   . Chiari malformation   . Seizure disorder   . ROTATOR CUFF TEAR 10/13/2007    Qualifier: Diagnosis of  By: Marcelino Scot CMA, [place] Bilberry     Past Surgical History  Procedure Laterality Date  . Rotator cuff repair  06/2007    right  . Abdominal hysterectomy    . Back surgery  03/1999    disk  . Breast surgery      breast biopsy x2 negative  . Shoulder surgery  2007    left  . Joint replacement      Rt knee patella replacement  . Colonoscopy  08/28/2012    Procedure: COLONOSCOPY;  Surgeon: Beryle Beams, MD;  Location: [facility] ENDOSCOPY;  Service: Endoscopy;  Laterality: N/A;   Social History:   reports that she has never smoked. She has never used smokeless tobacco. She reports that she does not drink alcohol or use illicit drugs.  Family History  Problem Relation Age of Onset  . Stroke Mother   . Stroke Father     Medications: Patient's Medications  New Prescriptions   No medications on file  Previous Medications   ACETAMINOPHEN (TYLENOL) 325 MG TABLET    Take 650 mg by mouth 3 (three) times daily.    ALENDRONATE (FOSAMAX) 70 MG TABLET    Take 70 mg by mouth every 7 (seven) days. Saturday. Take with a full glass of water on an empty stomach.   ALUM & MAG HYDROXIDE-SIMETH (MAALOX/MYLANTA) 200-200-20 MG/5ML SUSPENSION    Take 30 mLs by mouth every 6 (six) hours as needed for indigestion or heartburn (dyspepsia).   CALCIUM CARBONATE-VITAMIN D (CALCIUM-D) 600-400 MG-UNIT TABS    Take by mouth 2 (two) times daily. For Osteoporosis   CRANBERRY 250 MG TABS    Take 1 tablet (250 mg total) by mouth daily.   DONEPEZIL (ARICEPT) 10 MG TABLET    Take 20 mg by mouth at bedtime.     ESCITALOPRAM (LEXAPRO) 10 MG TABLET    Take 10 mg by mouth daily.   FEEDING SUPPLEMENT, GLUCERNA SHAKE, (GLUCERNA SHAKE) LIQD    Take 237 mLs by mouth 2 (two) times daily with a meal.   FERROUS SULFATE 325 (65 FE) MG TABLET    Take 325 mg by mouth 2 (two) times daily with a meal.   FUROSEMIDE (LASIX) 20 MG TABLET    Take 10 mg by mouth daily.    LEVETIRACETAM (KEPPRA) 500 MG TABLET    Take 1 tablet (500 mg total) by mouth 2 (two) times daily.   LORAZEPAM (ATIVAN) 0.5 MG TABLET    Take 0.25 mg by mouth every 8 (eight) hours as needed for anxiety (1 by mouth scheduled on Saturday and Sunday).    MELATONIN 3 MG CAPS    Take 3 mg by mouth daily.   MEMANTINE (NAMENDA XR) 14 MG CP24 24 HR CAPSULE    Take 14 mg by mouth daily.   MIRABEGRON ER (MYRBETRIQ) 25 MG TB24 TABLET    Take 25 mg by mouth daily.   NYSTATIN CREAM (MYCOSTATIN)    Apply 1 application topically daily. Apply to abdominal pannus and groin areas until yeast resolves.   ONDANSETRON (ZOFRAN) 4 MG TABLET    Take 4 mg by mouth every 8 (eight) hours as needed for nausea or vomiting (nausea).    OXYCODONE-ACETAMINOPHEN (PERCOCET) 10-325 MG PER TABLET    Take one tablet by mouth every night at bedtime for pain. Take one tablet by mouth every 4 hours as needed for pain.  Do not exceed 4gm of Tylenol in 24hr   OXYGEN    Inhale 3 L into the lungs continuous.   POLYETHYLENE GLYCOL (MIRALAX / GLYCOLAX) PACKET    Take 17 g by mouth 2 (two) times daily.   RISPERIDONE (RISPERDAL) 1 MG/ML ORAL SOLUTION    Take 0.5 mg by mouth 2 (two) times daily. Take at 8am and 6pm for psychosis.   SENNA (SENOKOT) 8.6 MG TABS TABLET    Take 1 tablet by mouth 2 (two) times daily.    TIZANIDINE (ZANAFLEX) 2 MG CAPSULE    Take 2 mg by mouth 2 (two) times daily as needed for muscle spasms. Do not administer if patient is sedated.   TRAZODONE (DESYREL) 50 MG TABLET    Take 50 mg by mouth at bedtime.   TRAZODONE (DESYREL) 50 MG TABLET    Take 50 mg by mouth at  bedtime as  needed for sleep (If not asleep by midnight.).  Modified Medications   No medications on file  Discontinued Medications   METHOCARBAMOL (ROBAXIN) 500 MG TABLET    Take 500 mg by mouth 3 (three) times daily as needed.    QUETIAPINE (SEROQUEL) 25 MG TABLET    Take 25 mg by mouth at bedtime.     Physical Exam: Filed Vitals:   07/09/15 1056  BP: 101/50  Pulse: 67  Temp: 98.2 F (36.8 C)  TempSrc: Oral  Resp: 20  Weight: 164 lb 8 oz (74.617 kg)  SpO2: 98%    Physical Exam  Physical Exam  Constitutional: She appears well-developed and well-nourished. She appears lethargic. No distress.  HENT:  Head: Normocephalic and atraumatic.  Mouth/Throat: Oropharynx is clear and moist.  Eyes: Pupils are equal, round, and reactive to light.  Neck: Normal range of motion. Neck supple.  Cardiovascular: Normal rate, regular rhythm and normal heart sounds.   No murmur heard. Pulmonary/Chest: Effort normal and breath sounds normal.  Abdominal: Soft. Bowel sounds are normal. She exhibits no distension.  Musculoskeletal: Normal range of motion.  Neurological: She appears lethargic.  Nonverbal   Skin: Skin is warm and dry. She is not diaphoretic.  Psychiatric: She is noncommunicative.      Labs reviewed: Basic Metabolic Panel:  Recent Labs  08/22/14 0928 09/02/14 0830  11/25/14 0606 01/29/15 03/13/15 03/27/15  NA 143 148*  < > 144 143 141 138  K 4.4 4.1  < > 4.1 4.0 4.2 4.6  CL 104 109*  --  108  --   --   --   CO2 26 37*  --  25  --   --   --   GLUCOSE 137* 141*  --  145*  --   --   --   BUN 24* 60*  < > 33* 30* 34* 35*  CREATININE 1.18* 1.21  < > 1.39* 1.3* 1.4* 1.2*  CALCIUM 9.1 8.6  --  9.2  --   --   --   < > = values in this interval not displayed. Liver Function Tests:  Recent Labs  07/10/14 1234 08/21/14 1500 03/27/15  AST 13 13 15   ALT 8 9 16   ALKPHOS 46 45 43  BILITOT 0.4 0.3  --   PROT 6.6 6.4  --   ALBUMIN 3.5 3.4*  --    No results for input(s): LIPASE,  AMYLASE in the last 8760 hours. No results for input(s): AMMONIA in the last 8760 hours. CBC:  Recent Labs  07/10/14 1234 08/21/14 1500 08/22/14 0928 11/25/14 0606 03/13/15 03/27/15  WBC 7.6 10.9* 6.6 10.7* 7.9 7.6  NEUTROABS 5.5 8.5*  --   --   --   --   HGB 11.9* 11.1* 10.5* 9.5* 10.5* 10.2*  HCT 37.3 33.8* 32.7* 29.0* 32* 31*  MCV 90.1 91.6 91.9 90.6  --   --   PLT 221 227 209 280 254 241   TSH: No results for input(s): TSH in the last 8760 hours. A1C: Lab Results  Component Value Date   HGBA1C 6.9* 08/27/2012   Lipid Panel: No results for input(s): CHOL, HDL, LDLCALC, TRIG, CHOLHDL, LDLDIRECT in the last 8760 hours.   Assessment/Plan 1. Essential hypertension - Stable. Blood pressures remain in desirable range.   2. COPD, mild - Stable. Breath sounds clear.  Maintained without medications.  Does not require oxygen.   3. DM (diabetes mellitus) type II controlled with renal manifestation -  Stable.  Maintained with proper diet.   4. Dementia with behavioral disturbance - Began using risperdal for behaviors.  Is followed by psychiatry. Creatinine is elevated but at baseline.   Staff to encourage pt to be up during the day. Sleep wake cycle is off.   5. Depression due to dementia - stable, Followed by psychiatry.   Will cont current regimen.   6. Anemia of chronic disease - Stable. Last CBC in august and hgb 10.6.  Takes iron supplement daily.  Continue same.    7. Muscle Spasms - pharmacy sends form in regards to methocarbamol and not on formulary. Will change methocarbamol to Tizanidine 2mg  po bid prn, hold if patient is sedated.       Carlos American. Harle Battiest  [facility] [phone] 8 am - 5 pm) [phone] (after hours)

## 2015-07-22 DIAGNOSIS — R400 Somnolence: Secondary | ICD-10-CM | POA: Insufficient documentation

## 2015-08-08 ENCOUNTER — Non-Acute Institutional Stay (SKILLED_NURSING_FACILITY): Payer: Medicare Other | Admitting: Nurse Practitioner

## 2015-08-08 DIAGNOSIS — F0393 Unspecified dementia, unspecified severity, with mood disturbance: Secondary | ICD-10-CM

## 2015-08-08 DIAGNOSIS — F0391 Unspecified dementia with behavioral disturbance: Secondary | ICD-10-CM | POA: Diagnosis not present

## 2015-08-08 DIAGNOSIS — F329 Major depressive disorder, single episode, unspecified: Secondary | ICD-10-CM

## 2015-08-08 DIAGNOSIS — F0280 Dementia in other diseases classified elsewhere without behavioral disturbance: Secondary | ICD-10-CM

## 2015-08-08 DIAGNOSIS — E46 Unspecified protein-calorie malnutrition: Secondary | ICD-10-CM

## 2015-08-08 DIAGNOSIS — R400 Somnolence: Secondary | ICD-10-CM

## 2015-08-08 DIAGNOSIS — G40909 Epilepsy, unspecified, not intractable, without status epilepticus: Secondary | ICD-10-CM

## 2015-08-08 DIAGNOSIS — G4710 Hypersomnia, unspecified: Secondary | ICD-10-CM

## 2015-08-08 DIAGNOSIS — F03918 Unspecified dementia, unspecified severity, with other behavioral disturbance: Secondary | ICD-10-CM

## 2015-08-08 NOTE — Progress Notes (Signed)
Patient ID: Linda [name], female   DOB: [birth year], 81 y.o.   MRN: [id number]    Nursing Home Location:  [place] of Service: SNF (31)  PCP: Leonides Sake, MD  Allergies  Allergen Reactions  . Atorvastatin Other (See Comments)    REACTION: increased CPK  . Cephalexin Nausea Only and Other (See Comments)    Weak, Headache, has tolerated ceftriaxone  . Cetirizine Hcl Other (See Comments)    REACTION: makes her head feel big  . Cortisone Other (See Comments)    REACTION: increased bp  . Ezetimibe-Simvastatin Other (See Comments)    unknown  . Fentanyl Other (See Comments)    REACTION: stinging in her skin  . Guaifenesin Other (See Comments)    unknown  . Ketorolac Tromethamine     REACTION: no work  . Lisinopril Cough    ACE related  . Metformin Other (See Comments)    epistaxis  . Nsaids Other (See Comments)    epistaxis  . Penicillins Nausea Only and Other (See Comments)    Headache, has tolerated Zosyn  . Rofecoxib Itching  . Rosuvastatin Other (See Comments)    Myalgias  . Simvastatin Other (See Comments)    unknown  . Spironolactone Nausea Only  . Sulfamethoxazole-Trimethoprim Other (See Comments)    unknown  . Codeine Rash  . Tape Rash    No chief complaint on file.   HPI:  Patient is a 81 y.o. female seen today at [facility] for medical management of her chronic issues.  She has a past medical history of HTN, TIA, anxiety, dementia and seizure disorder. Nursing reports that she has some days that she sleeps most of the day, then there are other days that she is more alert.  She is eating fairly well according to nursing.  Patient is able to answer some questions, but review of systems is limited due to her advanced dementia.  Nursing without concerns at this time.   Review of Systems: Limited due to dementia.   Review of Systems  Constitutional: Positive for fatigue.  Respiratory: Negative for cough and  shortness of breath.   Cardiovascular: Negative for chest pain.  Gastrointestinal: Negative for abdominal pain.    Past Medical History  Diagnosis Date  . Allergy     allergic rhinitis  . Diabetes mellitus     type II  . Hypertension   . Osteopenia   . TIA (transient ischemic attack)   . Contact dermatitis     on buttocks  . Nosebleed   . Anxiety   . Renal insufficiency   . Pyelonephritis   . Dementia   . Chiari malformation   . Seizure disorder   . ROTATOR CUFF TEAR 10/13/2007    Qualifier: Diagnosis of  By: Marcelino Scot CMA, [place] Bilberry     Past Surgical History  Procedure Laterality Date  . Rotator cuff repair  06/2007    right  . Abdominal hysterectomy    . Back surgery  03/1999    disk  . Breast surgery      breast biopsy x2 negative  . Shoulder surgery  2007    left  . Joint replacement      Rt knee patella replacement  . Colonoscopy  08/28/2012    Procedure: COLONOSCOPY;  Surgeon: Beryle Beams, MD;  Location: [facility] ENDOSCOPY;  Service: Endoscopy;  Laterality: N/A;   Social History:   reports that she has  never smoked. She has never used smokeless tobacco. She reports that she does not drink alcohol or use illicit drugs.  Family History  Problem Relation Age of Onset  . Stroke Mother   . Stroke Father     Medications: Patient's Medications  New Prescriptions   No medications on file  Previous Medications   ACETAMINOPHEN (TYLENOL) 325 MG TABLET    Take 650 mg by mouth 3 (three) times daily.    ALENDRONATE (FOSAMAX) 70 MG TABLET    Take 70 mg by mouth every 7 (seven) days. Saturday. Take with a full glass of water on an empty stomach.   ALUM & MAG HYDROXIDE-SIMETH (MAALOX/MYLANTA) 200-200-20 MG/5ML SUSPENSION    Take 30 mLs by mouth every 6 (six) hours as needed for indigestion or heartburn (dyspepsia).   CALCIUM CARBONATE-VITAMIN D (CALCIUM-D) 600-400 MG-UNIT TABS    Take by mouth 2 (two) times daily. For Osteoporosis   CRANBERRY 250 MG TABS    Take 1 tablet  (250 mg total) by mouth daily.   DONEPEZIL (ARICEPT) 10 MG TABLET    Take 20 mg by mouth at bedtime.    ESCITALOPRAM (LEXAPRO) 10 MG TABLET    Take 10 mg by mouth daily.   FEEDING SUPPLEMENT, GLUCERNA SHAKE, (GLUCERNA SHAKE) LIQD    Take 237 mLs by mouth 2 (two) times daily with a meal.   FERROUS SULFATE 325 (65 FE) MG TABLET    Take 325 mg by mouth 2 (two) times daily with a meal.   FUROSEMIDE (LASIX) 20 MG TABLET    Take 10 mg by mouth daily.    LEVETIRACETAM (KEPPRA) 500 MG TABLET    Take 1 tablet (500 mg total) by mouth 2 (two) times daily.   LORAZEPAM (ATIVAN) 0.5 MG TABLET    Take 0.25 mg by mouth every 8 (eight) hours as needed for anxiety (1 by mouth scheduled on Saturday and Sunday).    MELATONIN 3 MG CAPS    Take 3 mg by mouth daily.   MEMANTINE (NAMENDA XR) 14 MG CP24 24 HR CAPSULE    Take 14 mg by mouth daily.   MIRABEGRON ER (MYRBETRIQ) 25 MG TB24 TABLET    Take 25 mg by mouth daily.   NYSTATIN CREAM (MYCOSTATIN)    Apply 1 application topically daily. Apply to abdominal pannus and groin areas until yeast resolves.   ONDANSETRON (ZOFRAN) 4 MG TABLET    Take 4 mg by mouth every 8 (eight) hours as needed for nausea or vomiting (nausea).    OXYCODONE-ACETAMINOPHEN (PERCOCET) 10-325 MG PER TABLET    Take one tablet by mouth every night at bedtime for pain. Take one tablet by mouth every 4 hours as needed for pain.  Do not exceed 4gm of Tylenol in 24hr   OXYGEN    Inhale 3 L into the lungs continuous.   POLYETHYLENE GLYCOL (MIRALAX / GLYCOLAX) PACKET    Take 17 g by mouth 2 (two) times daily.   RISPERIDONE (RISPERDAL) 1 MG/ML ORAL SOLUTION    Take 0.5 mg by mouth 2 (two) times daily. Take .5mg  at 8am and 1mg  at 6pm for psychosis.   SENNA (SENOKOT) 8.6 MG TABS TABLET    Take 1 tablet by mouth 2 (two) times daily.    TIZANIDINE (ZANAFLEX) 2 MG CAPSULE    Take 2 mg by mouth 2 (two) times daily as needed for muscle spasms. Do not administer if patient is sedated.   TRAZODONE (DESYREL) 50 MG  TABLET  Take 50 mg by mouth at bedtime.   TRAZODONE (DESYREL) 50 MG TABLET    Take 50 mg by mouth at bedtime as needed for sleep (If not asleep by midnight.).  Modified Medications   No medications on file  Discontinued Medications   No medications on file     Physical Exam: Filed Vitals:   08/08/15 1039  BP: 118/61  Pulse: 70  Temp: 97.1 F (36.2 C)  TempSrc: Oral  Resp: 20  Weight: 167 lb 3.2 oz (75.841 kg)  SpO2: 97%    Physical Exam  Constitutional: No distress.  Frail elderly female  HENT:  Head: Normocephalic and atraumatic.  Eyes: Conjunctivae are normal. Pupils are equal, round, and reactive to light.  Neck: Normal range of motion. Neck supple.  Cardiovascular: Normal rate, regular rhythm and normal heart sounds.   Pulmonary/Chest: Effort normal and breath sounds normal. She has no wheezes. She has no rales.  Abdominal: Soft. Bowel sounds are normal. She exhibits no distension. There is no tenderness.  Musculoskeletal: Normal range of motion. She exhibits no edema.  Lymphadenopathy:    She has no cervical adenopathy.  Neurological: She is alert.  Skin: Skin is warm and dry.  Psychiatric: She has a normal mood and affect.    Labs reviewed: Basic Metabolic Panel:  Recent Labs  08/22/14 0928 09/02/14 0830  11/25/14 0606  03/13/15 03/27/15 06/13/15 0754  NA 143 148*  < > 144  < > 141 138 143  K 4.4 4.1  < > 4.1  < > 4.2 4.6 4.1  CL 104 109*  --  108  --   --   --   --   CO2 26 37*  --  25  --   --   --   --   GLUCOSE 137* 141*  --  145*  --   --   --   --   BUN 24* 60*  < > 33*  < > 34* 35* 46*  CREATININE 1.18* 1.21  < > 1.39*  < > 1.4* 1.2* 1.7*  CALCIUM 9.1 8.6  --  9.2  --   --   --   --   < > = values in this interval not displayed. Liver Function Tests:  Recent Labs  08/21/14 1500 03/27/15 06/13/15 0754  AST 13 15 12*  ALT 9 16 10   ALKPHOS 45 43 49  BILITOT 0.3  --   --   PROT 6.4  --   --   ALBUMIN 3.4*  --   --    No results for  input(s): LIPASE, AMYLASE in the last 8760 hours. No results for input(s): AMMONIA in the last 8760 hours. CBC:  Recent Labs  08/21/14 1500 08/22/14 0928 11/25/14 0606 03/13/15 03/27/15 06/13/15 0754  WBC 10.9* 6.6 10.7* 7.9 7.6 7.8  NEUTROABS 8.5*  --   --   --   --   --   HGB 11.1* 10.5* 9.5* 10.5* 10.2* 10.6*  HCT 33.8* 32.7* 29.0* 32* 31* 31*  MCV 91.6 91.9 90.6  --   --   --   PLT 227 209 280 254 241 267   TSH:  Recent Labs  06/13/15 0754  TSH 3.03   A1C: Lab Results  Component Value Date   HGBA1C 6.9* 08/27/2012   Lipid Panel: No results for input(s): CHOL, HDL, LDLCALC, TRIG, CHOLHDL, LDLDIRECT in the last 8760 hours.   Assessment/Plan 1. Dementia with behavioral disturbance Stable. No new behaviors  reported from nursing.  Sleep wake cycle continues to be off.  Patient is followed by Psych.  She is on Risperdal for behaviors.  continues on aricept and namenda   2. Depression due to dementia Stable. Currently on lexapro will Continue same.   3. Seizure disorder Stable.  No reports of seizures from nursing.  Continue Keppra.  Keppra level checked last month and was in appropriate range.    4. Protein-calorie malnutrition Improved.  Gained 3 pounds in the last month.  Continue house shake with meds twice a day.  Patient already on fortified food diet.    5. Daytime somnolence Continues.  Risperdal dose adjusted by Pysch. Notes from nursing indicate that she is participating in restorative care, preforming ROM exercises and uses walker sometimes.  Continue to encourage daytime activity and keep resident out of bed as much as possible during the day.       Carlos American. Harle Battiest  [facility] [phone] 8 am - 5 pm) [phone] (after hours)

## 2015-09-09 ENCOUNTER — Emergency Department (HOSPITAL_COMMUNITY): Payer: Medicare Other

## 2015-09-09 ENCOUNTER — Encounter (HOSPITAL_COMMUNITY): Payer: Self-pay

## 2015-09-09 ENCOUNTER — Inpatient Hospital Stay (HOSPITAL_COMMUNITY)
Admission: EM | Admit: 2015-09-09 | Discharge: 2015-09-18 | DRG: 689 | Disposition: A | Discharge status: Hospice | Payer: Medicare Other | Attending: Internal Medicine | Admitting: Internal Medicine

## 2015-09-09 DIAGNOSIS — I10 Essential (primary) hypertension: Secondary | ICD-10-CM | POA: Diagnosis present

## 2015-09-09 DIAGNOSIS — D638 Anemia in other chronic diseases classified elsewhere: Secondary | ICD-10-CM | POA: Diagnosis present

## 2015-09-09 DIAGNOSIS — Z87440 Personal history of urinary (tract) infections: Secondary | ICD-10-CM

## 2015-09-09 DIAGNOSIS — G92 Toxic encephalopathy: Secondary | ICD-10-CM | POA: Diagnosis present

## 2015-09-09 DIAGNOSIS — J9811 Atelectasis: Secondary | ICD-10-CM | POA: Diagnosis present

## 2015-09-09 DIAGNOSIS — E119 Type 2 diabetes mellitus without complications: Secondary | ICD-10-CM

## 2015-09-09 DIAGNOSIS — F419 Anxiety disorder, unspecified: Secondary | ICD-10-CM | POA: Diagnosis present

## 2015-09-09 DIAGNOSIS — N390 Urinary tract infection, site not specified: Secondary | ICD-10-CM | POA: Diagnosis present

## 2015-09-09 DIAGNOSIS — I959 Hypotension, unspecified: Secondary | ICD-10-CM | POA: Diagnosis present

## 2015-09-09 DIAGNOSIS — R4182 Altered mental status, unspecified: Secondary | ICD-10-CM | POA: Diagnosis not present

## 2015-09-09 DIAGNOSIS — Z8673 Personal history of transient ischemic attack (TIA), and cerebral infarction without residual deficits: Secondary | ICD-10-CM

## 2015-09-09 DIAGNOSIS — Z515 Encounter for palliative care: Secondary | ICD-10-CM | POA: Diagnosis present

## 2015-09-09 DIAGNOSIS — E875 Hyperkalemia: Secondary | ICD-10-CM | POA: Diagnosis present

## 2015-09-09 DIAGNOSIS — N3001 Acute cystitis with hematuria: Secondary | ICD-10-CM | POA: Diagnosis not present

## 2015-09-09 DIAGNOSIS — J690 Pneumonitis due to inhalation of food and vomit: Secondary | ICD-10-CM | POA: Diagnosis not present

## 2015-09-09 DIAGNOSIS — E1122 Type 2 diabetes mellitus with diabetic chronic kidney disease: Secondary | ICD-10-CM | POA: Diagnosis present

## 2015-09-09 DIAGNOSIS — N179 Acute kidney failure, unspecified: Secondary | ICD-10-CM | POA: Diagnosis not present

## 2015-09-09 DIAGNOSIS — E1129 Type 2 diabetes mellitus with other diabetic kidney complication: Secondary | ICD-10-CM | POA: Diagnosis present

## 2015-09-09 DIAGNOSIS — R7981 Abnormal blood-gas level: Secondary | ICD-10-CM

## 2015-09-09 DIAGNOSIS — F03918 Unspecified dementia, unspecified severity, with other behavioral disturbance: Secondary | ICD-10-CM | POA: Diagnosis present

## 2015-09-09 DIAGNOSIS — J449 Chronic obstructive pulmonary disease, unspecified: Secondary | ICD-10-CM | POA: Diagnosis present

## 2015-09-09 DIAGNOSIS — E870 Hyperosmolality and hypernatremia: Secondary | ICD-10-CM | POA: Diagnosis present

## 2015-09-09 DIAGNOSIS — G40909 Epilepsy, unspecified, not intractable, without status epilepticus: Secondary | ICD-10-CM | POA: Diagnosis present

## 2015-09-09 DIAGNOSIS — I480 Paroxysmal atrial fibrillation: Secondary | ICD-10-CM | POA: Diagnosis not present

## 2015-09-09 DIAGNOSIS — D631 Anemia in chronic kidney disease: Secondary | ICD-10-CM | POA: Diagnosis present

## 2015-09-09 DIAGNOSIS — Z66 Do not resuscitate: Secondary | ICD-10-CM | POA: Diagnosis present

## 2015-09-09 DIAGNOSIS — E860 Dehydration: Secondary | ICD-10-CM | POA: Diagnosis present

## 2015-09-09 DIAGNOSIS — G9340 Encephalopathy, unspecified: Secondary | ICD-10-CM | POA: Diagnosis present

## 2015-09-09 DIAGNOSIS — F0391 Unspecified dementia with behavioral disturbance: Secondary | ICD-10-CM | POA: Diagnosis present

## 2015-09-09 DIAGNOSIS — Z794 Long term (current) use of insulin: Secondary | ICD-10-CM

## 2015-09-09 DIAGNOSIS — I272 Other secondary pulmonary hypertension: Secondary | ICD-10-CM

## 2015-09-09 DIAGNOSIS — N189 Chronic kidney disease, unspecified: Secondary | ICD-10-CM | POA: Diagnosis present

## 2015-09-09 DIAGNOSIS — F0390 Unspecified dementia without behavioral disturbance: Secondary | ICD-10-CM | POA: Insufficient documentation

## 2015-09-09 HISTORY — DX: Acute kidney failure, unspecified: N17.9

## 2015-09-09 HISTORY — DX: Unspecified osteoarthritis, unspecified site: M19.90

## 2015-09-09 HISTORY — DX: Cystitis, unspecified without hematuria: N30.90

## 2015-09-09 HISTORY — DX: Chronic obstructive pulmonary disease, unspecified: J44.9

## 2015-09-09 LAB — URINALYSIS, ROUTINE W REFLEX MICROSCOPIC
Glucose, UA: NEGATIVE mg/dL
Ketones, ur: 15 mg/dL — AB
Nitrite: NEGATIVE
SPECIFIC GRAVITY, URINE: 1.026 (ref 1.005–1.030)
UROBILINOGEN UA: 1 mg/dL (ref 0.0–1.0)
pH: 5 (ref 5.0–8.0)

## 2015-09-09 LAB — CBC WITH DIFFERENTIAL/PLATELET
BASOS ABS: 0 10*3/uL (ref 0.0–0.1)
Basophils Relative: 0 %
EOS ABS: 0 10*3/uL (ref 0.0–0.7)
EOS PCT: 0 %
HCT: 35.2 % — ABNORMAL LOW (ref 36.0–46.0)
Hemoglobin: 10.9 g/dL — ABNORMAL LOW (ref 12.0–15.0)
Lymphocytes Relative: 12 %
Lymphs Abs: 1.6 10*3/uL (ref 0.7–4.0)
MCH: 30.3 pg (ref 26.0–34.0)
MCHC: 31 g/dL (ref 30.0–36.0)
MCV: 97.8 fL (ref 78.0–100.0)
MONO ABS: 0.9 10*3/uL (ref 0.1–1.0)
Monocytes Relative: 7 %
Neutro Abs: 10.4 10*3/uL — ABNORMAL HIGH (ref 1.7–7.7)
Neutrophils Relative %: 81 %
PLATELETS: 207 10*3/uL (ref 150–400)
RBC: 3.6 MIL/uL — AB (ref 3.87–5.11)
RDW: 14.9 % (ref 11.5–15.5)
WBC: 12.8 10*3/uL — AB (ref 4.0–10.5)

## 2015-09-09 LAB — COMPREHENSIVE METABOLIC PANEL
ALT: 13 U/L — AB (ref 14–54)
AST: 15 U/L (ref 15–41)
Albumin: 3.7 g/dL (ref 3.5–5.0)
Alkaline Phosphatase: 41 U/L (ref 38–126)
Anion gap: 10 (ref 5–15)
BUN: 39 mg/dL — ABNORMAL HIGH (ref 6–20)
CHLORIDE: 101 mmol/L (ref 101–111)
CO2: 30 mmol/L (ref 22–32)
CREATININE: 3.18 mg/dL — AB (ref 0.44–1.00)
Calcium: 9.1 mg/dL (ref 8.9–10.3)
GFR, EST AFRICAN AMERICAN: 14 mL/min — AB (ref >60)
GFR, EST NON AFRICAN AMERICAN: 13 mL/min — AB (ref >60)
Glucose, Bld: 120 mg/dL — ABNORMAL HIGH (ref 65–99)
POTASSIUM: 4.7 mmol/L (ref 3.5–5.1)
SODIUM: 141 mmol/L (ref 135–145)
Total Bilirubin: 0.4 mg/dL (ref 0.3–1.2)
Total Protein: 5.8 g/dL — ABNORMAL LOW (ref 6.5–8.1)

## 2015-09-09 LAB — URINE MICROSCOPIC-ADD ON

## 2015-09-09 LAB — LACTIC ACID, PLASMA: LACTIC ACID, VENOUS: 1 mmol/L (ref 0.5–2.0)

## 2015-09-09 MED ORDER — ESCITALOPRAM OXALATE 10 MG PO TABS: 10.0000 mg | ORAL_TABLET | Freq: Every day | ORAL | Status: DC

## 2015-09-09 MED ORDER — CRANBERRY 250 MG PO TABS: 250.0000 mg | ORAL_TABLET | Freq: Every day | ORAL | Status: DC

## 2015-09-09 MED ORDER — DEXTROSE 5 % IV SOLN
1.0000 g | Freq: Once | INTRAVENOUS | Status: AC
  Administered 2015-09-09: 1 g via INTRAVENOUS
  Filled 2015-09-09: qty 10

## 2015-09-09 MED ORDER — CALCIUM-D 600-400 MG-UNIT PO TABS: ORAL_TABLET | Freq: Two times a day (BID) | ORAL | Status: DC

## 2015-09-09 MED ORDER — SENNA 8.6 MG PO TABS: 1.0000 | ORAL_TABLET | Freq: Two times a day (BID) | ORAL | Status: DC

## 2015-09-09 MED ORDER — ACETAMINOPHEN 325 MG PO TABS
650.0000 mg | ORAL_TABLET | Freq: Four times a day (QID) | ORAL | Status: DC | PRN
  Filled 2015-09-09: qty 2

## 2015-09-09 MED ORDER — ENOXAPARIN SODIUM 30 MG/0.3ML ~~LOC~~ SOLN
30.0000 mg | SUBCUTANEOUS | Status: DC
  Administered 2015-09-10 – 2015-09-17 (×8): 30 mg via SUBCUTANEOUS
  Filled 2015-09-09 (×7): qty 0.3

## 2015-09-09 MED ORDER — ONDANSETRON HCL 4 MG PO TABS: 4.0000 mg | ORAL_TABLET | Freq: Three times a day (TID) | ORAL | Status: DC | PRN

## 2015-09-09 MED ORDER — RISPERIDONE 0.5 MG PO TABS
0.5000 mg | ORAL_TABLET | Freq: Every day | ORAL | Status: DC
  Filled 2015-09-09: qty 1

## 2015-09-09 MED ORDER — RISPERIDONE 1 MG/ML PO SOLN
1.0000 mg | Freq: Every day | ORAL | Status: DC
  Filled 2015-09-09: qty 1

## 2015-09-09 MED ORDER — SODIUM CHLORIDE 0.9 % IV SOLN
INTRAVENOUS | Status: DC
  Administered 2015-09-09: 100 mL/h via INTRAVENOUS

## 2015-09-09 MED ORDER — SODIUM CHLORIDE 0.9 % IV BOLUS (SEPSIS)
1000.0000 mL | Freq: Once | INTRAVENOUS | Status: AC
  Administered 2015-09-09: 1000 mL via INTRAVENOUS

## 2015-09-09 MED ORDER — ALENDRONATE SODIUM 70 MG PO TABS: 70.0000 mg | ORAL_TABLET | ORAL | Status: DC

## 2015-09-09 MED ORDER — MIRABEGRON ER 25 MG PO TB24
25.0000 mg | ORAL_TABLET | Freq: Every day | ORAL | Status: DC
  Filled 2015-09-09: qty 1

## 2015-09-09 MED ORDER — NYSTATIN 100000 UNIT/GM EX CREA
1.0000 "application " | TOPICAL_CREAM | Freq: Every day | CUTANEOUS | Status: DC
  Administered 2015-09-10 – 2015-09-18 (×8): 1 via TOPICAL
  Filled 2015-09-09: qty 15

## 2015-09-09 MED ORDER — SODIUM CHLORIDE 0.9 % IV SOLN
500.0000 mg | Freq: Two times a day (BID) | INTRAVENOUS | Status: DC
  Administered 2015-09-10 – 2015-09-18 (×18): 500 mg via INTRAVENOUS
  Filled 2015-09-09 (×19): qty 5

## 2015-09-09 MED ORDER — FUROSEMIDE 20 MG PO TABS: 10.0000 mg | ORAL_TABLET | Freq: Every day | ORAL | Status: DC

## 2015-09-09 MED ORDER — RISPERIDONE 1 MG/ML PO SOLN: 0.5000 mg | Freq: Two times a day (BID) | ORAL | Status: DC

## 2015-09-09 MED ORDER — DONEPEZIL HCL 10 MG PO TABS: 20.0000 mg | ORAL_TABLET | Freq: Every day | ORAL | Status: DC

## 2015-09-09 MED ORDER — LEVETIRACETAM 500 MG PO TABS: 500.0000 mg | ORAL_TABLET | Freq: Two times a day (BID) | ORAL | Status: DC

## 2015-09-09 MED ORDER — ALUM & MAG HYDROXIDE-SIMETH 200-200-20 MG/5ML PO SUSP: 30.0000 mL | Freq: Four times a day (QID) | ORAL | Status: DC | PRN

## 2015-09-09 MED ORDER — FERROUS SULFATE 325 (65 FE) MG PO TABS: 325.0000 mg | ORAL_TABLET | Freq: Two times a day (BID) | ORAL | Status: DC

## 2015-09-09 MED ORDER — MEMANTINE HCL ER 14 MG PO CP24
14.0000 mg | ORAL_CAPSULE | Freq: Every day | ORAL | Status: DC
  Filled 2015-09-09: qty 1

## 2015-09-09 MED ORDER — GLUCERNA SHAKE PO LIQD: 237.0000 mL | Freq: Two times a day (BID) | ORAL | Status: DC

## 2015-09-09 MED ORDER — CEFTRIAXONE SODIUM 1 G IJ SOLR
1.0000 g | INTRAMUSCULAR | Status: DC
  Administered 2015-09-10: 1 g via INTRAVENOUS
  Filled 2015-09-09 (×2): qty 10

## 2015-09-09 MED ORDER — ACETAMINOPHEN 650 MG RE SUPP: 650.0000 mg | Freq: Four times a day (QID) | RECTAL | Status: DC | PRN

## 2015-09-09 MED ORDER — INSULIN ASPART 100 UNIT/ML ~~LOC~~ SOLN
0.0000 [IU] | SUBCUTANEOUS | Status: DC
  Administered 2015-09-10 – 2015-09-11 (×4): 1 [IU] via SUBCUTANEOUS
  Administered 2015-09-11: 2 [IU] via SUBCUTANEOUS
  Administered 2015-09-11 – 2015-09-12 (×2): 1 [IU] via SUBCUTANEOUS
  Administered 2015-09-12: 2 [IU] via SUBCUTANEOUS
  Administered 2015-09-12 – 2015-09-14 (×12): 1 [IU] via SUBCUTANEOUS
  Administered 2015-09-14 – 2015-09-15 (×2): 2 [IU] via SUBCUTANEOUS
  Administered 2015-09-15 – 2015-09-18 (×5): 1 [IU] via SUBCUTANEOUS

## 2015-09-09 MED ORDER — POLYETHYLENE GLYCOL 3350 17 G PO PACK
17.0000 g | PACK | Freq: Two times a day (BID) | ORAL | Status: DC
Start: 2015-09-09 — End: 2015-09-10

## 2015-09-09 MED ORDER — MELATONIN 3 MG PO TABS
3.0000 mg | ORAL_TABLET | Freq: Every day | ORAL | Status: DC
  Filled 2015-09-09 (×4): qty 1

## 2015-09-09 NOTE — ED Provider Notes (Signed)
CSN: [id number]     Arrival date & time 09/09/15  1707 History   First MD Initiated Contact with Patient 09/09/15 1709     Chief Complaint  Patient presents with  . Altered Mental Status     Level V caveat: Dementia  HPI Patient brought to the emergency department for altered mental status noted by family members while at the nursing home.  Family reports that when they came into the patient's room the patient was staring straight ahead and not responsive which is abnormal for her.  Family also reports that she was not wearing her oxygen which she is supposed to wear continuously and that her oxygen level was low so more around 70%.  EMS reports that when they presented to the patient's bedside she was not very responsive but there was a report that she just received opioid medication for pain.  EMS gave Narcan with improvement in her mental status.  After Narcan the patient became nauseated and vomited.  Both EMS and family reports that the patient is doing much better at this time.  Family reports this is all the patient normally acts when she has a urinary tract infection.  Borderline blood pressure 97/44 on arrival to the ER.  Patient is a DO NOT RESUSCITATE.   Past Medical History  Diagnosis Date  . Allergy     allergic rhinitis  . Diabetes mellitus     type II  . Hypertension   . Osteopenia   . TIA (transient ischemic attack)   . Contact dermatitis     on buttocks  . Nosebleed   . Anxiety   . Renal insufficiency   . Pyelonephritis   . Dementia   . Chiari malformation   . Seizure disorder ([place])   . ROTATOR CUFF TEAR 10/13/2007    Qualifier: Diagnosis of  By: Marcelino Scot CMA, [place] Bilberry     Past Surgical History  Procedure Laterality Date  . Rotator cuff repair  06/2007    right  . Abdominal hysterectomy    . Back surgery  03/1999    disk  . Breast surgery      breast biopsy x2 negative  . Shoulder surgery  2007    left  . Joint replacement      Rt knee patella  replacement  . Colonoscopy  08/28/2012    Procedure: COLONOSCOPY;  Surgeon: Beryle Beams, MD;  Location: [facility] ENDOSCOPY;  Service: Endoscopy;  Laterality: N/A;   Family History  Problem Relation Age of Onset  . Stroke Mother   . Stroke Father    Social History  Substance Use Topics  . Smoking status: Never Smoker   . Smokeless tobacco: Never Used  . Alcohol Use: No   OB History    No data available     Review of Systems  Unable to perform ROS: Dementia      Allergies  Atorvastatin; Cephalexin; Cetirizine hcl; Cortisone; Ezetimibe-simvastatin; Fentanyl; Guaifenesin; Ketorolac tromethamine; Lisinopril; Metformin; Nsaids; Penicillins; Rofecoxib; Rosuvastatin; Simvastatin; Spironolactone; Sulfamethoxazole-trimethoprim; Codeine; and Tape  Home Medications   Prior to Admission medications   Medication Sig Start Date End Date Taking? Authorizing Provider  acetaminophen (TYLENOL) 325 MG tablet Take 650 mg by mouth 3 (three) times daily.  05/23/15   Lauree Chandler, NP  alendronate (FOSAMAX) 70 MG tablet Take 70 mg by mouth every 7 (seven) days. Saturday. Take with a full glass of water on an empty stomach.    Historical Provider, MD  alum &  mag hydroxide-simeth (MAALOX/MYLANTA) 200-200-20 MG/5ML suspension Take 30 mLs by mouth every 6 (six) hours as needed for indigestion or heartburn (dyspepsia). 08/24/14   Robbie Lis, MD  Calcium Carbonate-Vitamin D (CALCIUM-D) 600-400 MG-UNIT TABS Take by mouth 2 (two) times daily. For Osteoporosis    Historical Provider, MD  Cranberry 250 MG TABS Take 1 tablet (250 mg total) by mouth daily. 08/25/14   Gerlene Fee, NP  donepezil (ARICEPT) 10 MG tablet Take 20 mg by mouth at bedtime.     Historical Provider, MD  escitalopram (LEXAPRO) 10 MG tablet Take 10 mg by mouth daily.    Historical Provider, MD  feeding supplement, GLUCERNA SHAKE, (GLUCERNA SHAKE) LIQD Take 237 mLs by mouth 2 (two) times daily with a meal. 05/23/15   Lauree Chandler,  NP  ferrous sulfate 325 (65 FE) MG tablet Take 325 mg by mouth 2 (two) times daily with a meal.    Historical Provider, MD  furosemide (LASIX) 20 MG tablet Take 10 mg by mouth daily.     Historical Provider, MD  levETIRAcetam (KEPPRA) 500 MG tablet Take 1 tablet (500 mg total) by mouth 2 (two) times daily. 07/12/14   Charlynne Cousins, MD  LORazepam (ATIVAN) 0.5 MG tablet Take 0.25 mg by mouth every 8 (eight) hours as needed for anxiety (1 by mouth scheduled on Saturday and Sunday).     Historical Provider, MD  Melatonin 3 MG CAPS Take 3 mg by mouth daily.    Historical Provider, MD  memantine (NAMENDA XR) 14 MG CP24 24 hr capsule Take 14 mg by mouth daily.    Historical Provider, MD  mirabegron ER (MYRBETRIQ) 25 MG TB24 tablet Take 25 mg by mouth daily.    Historical Provider, MD  nystatin cream (MYCOSTATIN) Apply 1 application topically daily. Apply to abdominal pannus and groin areas until yeast resolves.    Historical Provider, MD  ondansetron (ZOFRAN) 4 MG tablet Take 4 mg by mouth every 8 (eight) hours as needed for nausea or vomiting (nausea).     Historical Provider, MD  oxyCODONE-acetaminophen (PERCOCET) 10-325 MG per tablet Take one tablet by mouth every night at bedtime for pain. Take one tablet by mouth every 4 hours as needed for pain.  Do not exceed 4gm of Tylenol in 24hr 06/12/15   Estill Dooms, MD  OXYGEN Inhale 3 L into the lungs continuous.    Historical Provider, MD  polyethylene glycol (MIRALAX / GLYCOLAX) packet Take 17 g by mouth 2 (two) times daily.    Historical Provider, MD  risperiDONE (RISPERDAL) 1 MG/ML oral solution Take 0.5 mg by mouth 2 (two) times daily. Take .5mg  at 8am and 1mg  at 6pm for psychosis.    Historical Provider, MD  senna (SENOKOT) 8.6 MG TABS tablet Take 1 tablet by mouth 2 (two) times daily.     Historical Provider, MD  tizanidine (ZANAFLEX) 2 MG capsule Take 2 mg by mouth 2 (two) times daily as needed for muscle spasms. Do not administer if patient is  sedated.    Historical Provider, MD  traZODone (DESYREL) 50 MG tablet Take 50 mg by mouth at bedtime.    Historical Provider, MD  traZODone (DESYREL) 50 MG tablet Take 50 mg by mouth at bedtime as needed for sleep (If not asleep by midnight.).    Historical Provider, MD   BP 94/42 mmHg  Pulse 58  Temp(Src) 97.9 F (36.6 C) (Axillary)  Resp 18  SpO2 100% Physical Exam  Constitutional:  She appears well-developed and well-nourished. No distress.  HENT:  Head: Normocephalic and atraumatic.  Eyes: EOM are normal.  Neck: Normal range of motion.  Cardiovascular: Normal rate, regular rhythm and normal heart sounds.   Pulmonary/Chest: Effort normal and breath sounds normal.  Abdominal: Soft. She exhibits no distension. There is no tenderness.  Musculoskeletal: Normal range of motion.  Neurological: She is alert.  Follow simple commands  Skin: Skin is warm and dry.  Psychiatric: She has a normal mood and affect. Judgment normal.  Nursing note and vitals reviewed.   ED Course  Procedures (including critical care time) Labs Review Labs Reviewed  CBC WITH DIFFERENTIAL/PLATELET - Abnormal; Notable for the following:    WBC 12.8 (*)    RBC 3.60 (*)    Hemoglobin 10.9 (*)    HCT 35.2 (*)    Neutro Abs 10.4 (*)    All other components within normal limits  COMPREHENSIVE METABOLIC PANEL - Abnormal; Notable for the following:    Glucose, Bld 120 (*)    BUN 39 (*)    Creatinine, Ser 3.18 (*)    Total Protein 5.8 (*)    ALT 13 (*)    GFR calc non Af Amer 13 (*)    GFR calc Af Amer 14 (*)    All other components within normal limits  URINALYSIS, ROUTINE W REFLEX MICROSCOPIC (NOT AT [facility]) - Abnormal; Notable for the following:    Color, Urine AMBER (*)    APPearance TURBID (*)    Hgb urine dipstick LARGE (*)    Bilirubin Urine MODERATE (*)    Ketones, ur 15 (*)    Protein, ur >300 (*)    Leukocytes, UA LARGE (*)    All other components within normal limits  URINE MICROSCOPIC-ADD ON -  Abnormal; Notable for the following:    Squamous Epithelial / LPF FEW (*)    Bacteria, UA MANY (*)    All other components within normal limits  URINE CULTURE  LACTIC ACID, PLASMA  LACTIC ACID, PLASMA   BUN  Date Value Ref Range Status  09/09/2015 39* 6 - 20 mg/dL Final  06/13/2015 46* 4 - 21 mg/dL Final  03/27/2015 35* 4 - 21 mg/dL Final  03/13/2015 34* 4 - 21 mg/dL Final  01/29/2015 30* 4 - 21 mg/dL Final  11/25/2014 33* 6 - 23 mg/dL Final  09/02/2014 60* 7-18 mg/dL Final  08/22/2014 24* 6 - 23 mg/dL Final  08/21/2014 29* 6 - 23 mg/dL Final   CREATININE  Date Value Ref Range Status  06/13/2015 1.7* 0.5 - 1.1 mg/dL Final  03/27/2015 1.2* 0.5 - 1.1 mg/dL Final  03/13/2015 1.4* 0.5 - 1.1 mg/dL Final  01/29/2015 1.3* 0.5 - 1.1 mg/dL Final  09/02/2014 1.21 0.60-1.30 mg/dL Final   CREATININE, SER  Date Value Ref Range Status  09/09/2015 3.18* 0.44 - 1.00 mg/dL Final  11/25/2014 1.39* 0.50 - 1.10 mg/dL Final  08/22/2014 1.18* 0.50 - 1.10 mg/dL Final  08/21/2014 1.61* 0.50 - 1.10 mg/dL Final      Imaging Review Dg Chest 2 View  09/09/2015  CLINICAL DATA:  Shortness of breath, hypoxia, unresponsive EXAM: CHEST  2 VIEW COMPARISON:  11/25/2014 FINDINGS: Mild patchy right lower lobe opacity, atelectasis versus pneumonia. Left lung is clear. No pleural effusion or pneumothorax. Cardiomegaly. Degenerative changes of the visualized thoracolumbar spine. Right shoulder arthroplasty. IMPRESSION: Mild patchy right lower lobe opacity, atelectasis versus pneumonia. Electronically Signed   By: Julian Hy M.D.   On: 09/09/2015  18:09   Ct Head Wo Contrast  09/09/2015  CLINICAL DATA:  Altered mental status, lethargy EXAM: CT HEAD WITHOUT CONTRAST TECHNIQUE: Contiguous axial images were obtained from the base of the skull through the vertex without intravenous contrast. COMPARISON:  07/10/2014 FINDINGS: Calvarium intact. No significant inflammatory change in the visualized portions of the  sinuses. There is age-appropriate atrophy and low attenuation in the deep white matter. No evidence of vascular territory infarct. No hemorrhage or extra-axial fluid. Partially calcified 1 cm mass arising off the inner table of the left parietal lobe image number 23 likely a meningioma. It is stable. IMPRESSION: Age-related involutional change with no significant acute abnormality. Electronically Signed   By: Skipper Cliche M.D.   On: 09/09/2015 18:44   I have personally reviewed and evaluated these images and lab results as part of my medical decision-making.   EKG Interpretation None      MDM   Final diagnoses:  None    Patient be admitted for urinary tract infection.  Rocephin given.  Urine culture sent.  Patient has a bump in her BUN and creatinine from baseline.  Acute kidney injury is present.  Bladder scan will be performed at this time.  Question bilateral atelectasis versus pneumonia.  Patient has not been coughing.  This time we'll hold on antibiotics for pneumonia and cover urine.  Patient is a DO NOT RESUSCITATE.    Jola Schmidt, MD 09/09/15 2109

## 2015-09-09 NOTE — ED Notes (Signed)
Per EMS: Pt coming from [place] [facility]. Hx chronic back pain, and recent R shoulder surgery. [facility] gave pt narcotics for pain today. Pt then became lethargic, difficult to arouse. EMS gave .5mg  narcan, pt became nauseated and vomited. EMS gave 4mg  zofran. Pt alert at this time. Looking around the room, still no coherent speech. CBG = 157, BP 107 palpated, 94% on 4L.

## 2015-09-09 NOTE — H&P (Signed)
[facility] History and Physical  Linda [name] VCB:[id number] DOB: 1929/11/15 DOA: 09/09/2015   PCP: Blanchie Serve, MD    Chief Complaint: Altered mental status  HPI: Linda [name] is a 81 y.o. WF PMHx (resident of [facility]) Dementia, Seizure DO, TIA, Anxiety, CKD (baselineCr~1.6), HTN, Pulmonary Hypertension, Diabetes Type 2  Brought to the emergency department for altered mental status noted by family members while at the nursing home. Family reports that when they came into the patient's room the patient was staring straight ahead and not responsive which is abnormal for her. Family also reports that she was not wearing her oxygen which she is supposed to wear continuously and that her oxygen level was low so more around 70%. EMS reports that when they presented to the patient's bedside she was not very responsive but there was a report that she just received opioid medication for pain. EMS gave Narcan with improvement in her mental status. After Narcan the patient became nauseated and vomited. Both EMS and family reports that the patient is doing much better at this time. Family reports this is all the patient normally acts when she has a urinary tract infection. Borderline blood pressure 97/44 on arrival to the ER. Patient is a DO NOT RESUSCITATE.    Review of Systems: Unable to obtain patient unresponsive.    TRAVEL HISTORY: NA   Consultants:  NA  Procedure/Significant Events:  10/30 CT head without contrast;; partially calcified 1 cm arising off the inner table of the left parietal lobe; likely a meningioma 10/30 CXR;Mild patchy right lower lobe opacity, atelectasis versus pneumonia.   Culture  10/30 blood pending 10/30 urine pending 10/30 strep pneumo urine antigen pending 10/30 Legionella urine antigen pending   Antibiotics:  Ceftriaxone 10/30>>   DVT prophylaxis:  Lovenox  Devices     LINES / TUBES:     Past Medical History   Diagnosis Date  . Allergy     allergic rhinitis  . Diabetes mellitus     type II  . Hypertension   . Osteopenia   . TIA (transient ischemic attack)   . Contact dermatitis     on buttocks  . Nosebleed   . Anxiety   . Renal insufficiency   . Pyelonephritis   . Dementia   . Chiari malformation   . Seizure disorder ([place])   . ROTATOR CUFF TEAR 10/13/2007    Qualifier: Diagnosis of  By: Marcelino Scot CMA, [place]     Past Surgical History  Procedure Laterality Date  . Rotator cuff repair  06/2007    right  . Abdominal hysterectomy    . Back surgery  03/1999    disk  . Breast surgery      breast biopsy x2 negative  . Shoulder surgery  2007    left  . Joint replacement      Rt knee patella replacement  . Colonoscopy  08/28/2012    Procedure: COLONOSCOPY;  Surgeon: Beryle Beams, MD;  Location: [facility] ENDOSCOPY;  Service: Endoscopy;  Laterality: N/A;   Social History:  reports that she has never smoked. She has never used smokeless tobacco. She reports that she does not drink alcohol or use illicit drugs. where does patient live--home, ALF, SNF? [place]  Can patient participate in ADLs? No  Allergies  Allergen Reactions  . Amoxicillin Other (See Comments)    aching all over, has tolerated Zosyn - listed as allergy on [facility] 09/09/15  . Atorvastatin Other (See Comments)  REACTION: increased CPK - not listed on [facility] 09/09/15  . Cephalexin Nausea Only and Other (See Comments)    Weak, Headache, has tolerated ceftriaxone - listed as allergy on [facility] 09/09/15  . Cetirizine Hcl Other (See Comments)    REACTION: makes her head feel big - not listed on [facility] 09/09/15  . Cortisone Other (See Comments)    REACTION: increased bp - not listed on [facility] 09/09/15  . Ezetimibe-Simvastatin Other (See Comments)    Unknown - not listed on [facility] 09/09/15  . Fentanyl Other (See Comments)    REACTION: stinging in her skin - not listed on [facility] 09/09/15  . Guaifenesin Other (See Comments)    Unknown -  not listed on [facility] 09/09/15  . Ketorolac Tromethamine Other (See Comments)    REACTION: not work - not listed on [facility] 09/09/15  . Lisinopril Cough    ACE related - listed as allergy on [facility] 09/09/15  . Metformin Other (See Comments)    Epistaxis - not listed on [facility] 09/09/15  . Nsaids Other (See Comments)    Epistaxis - listed as allergy on [facility] 09/09/15  . Penicillins Nausea Only and Other (See Comments)    Headache, has tolerated Zosyn - listed as allergy on [facility] 09/09/15  . Rofecoxib Itching    Not listed on [facility] 09/09/15  . Rosuvastatin Other (See Comments)    Myalgias - not listed on [facility] 09/09/15  . Simvastatin Other (See Comments)    Unknown - not listed on [facility] 09/09/15  . Spironolactone Nausea Only    Listed as allergy on [facility] 09/09/15  . Sulfamethoxazole-Trimethoprim Other (See Comments)    Unknown - not listed on [facility] 09/09/15  . Codeine Rash    Not listed on [facility] 09/09/15  . Tape Rash    Not listed on [facility] 09/09/15    Family History  Problem Relation Age of Onset  . Stroke Mother   . Stroke Father        Prior to Admission medications   Medication Sig Start Date End Date Taking? Authorizing Provider  acetaminophen (TYLENOL) 325 MG tablet Take 650 mg by mouth 3 (three) times daily.  05/23/15   Lauree Chandler, NP  alendronate (FOSAMAX) 70 MG tablet Take 70 mg by mouth every 7 (seven) days. Saturday. Take with a full glass of water on an empty stomach.    Historical Provider, MD  alum & mag hydroxide-simeth (MAALOX/MYLANTA) 200-200-20 MG/5ML suspension Take 30 mLs by mouth every 6 (six) hours as needed for indigestion or heartburn (dyspepsia). 08/24/14   Robbie Lis, MD  Calcium Carbonate-Vitamin D (CALCIUM-D) 600-400 MG-UNIT TABS Take by mouth 2 (two) times daily. For Osteoporosis    Historical Provider, MD  Cranberry 250 MG TABS Take 1 tablet (250 mg total) by mouth daily. 08/25/14   Gerlene Fee, NP  donepezil (ARICEPT) 10 MG tablet Take 20 mg by mouth at bedtime.      Historical Provider, MD  escitalopram (LEXAPRO) 10 MG tablet Take 10 mg by mouth daily.    Historical Provider, MD  feeding supplement, GLUCERNA SHAKE, (GLUCERNA SHAKE) LIQD Take 237 mLs by mouth 2 (two) times daily with a meal. 05/23/15   Lauree Chandler, NP  ferrous sulfate 325 (65 FE) MG tablet Take 325 mg by mouth 2 (two) times daily with a meal.    Historical Provider, MD  furosemide (LASIX) 20 MG tablet Take 10 mg by mouth daily.     Historical Provider, MD  levETIRAcetam (KEPPRA) 500 MG tablet Take 1 tablet (500 mg total) by mouth 2 (two) times daily. 07/12/14   Charlynne Cousins, MD  LORazepam (ATIVAN) 0.5 MG tablet Take 0.25 mg by mouth every 8 (eight) hours as needed for anxiety (1 by mouth scheduled on Saturday and Sunday).     Historical Provider, MD  Melatonin 3 MG CAPS Take 3 mg by mouth daily.    Historical Provider, MD  memantine (NAMENDA XR) 14 MG CP24 24 hr capsule Take 14 mg by mouth daily.    Historical Provider, MD  mirabegron ER (MYRBETRIQ) 25 MG TB24 tablet Take 25 mg by mouth daily.    Historical Provider, MD  nystatin cream (MYCOSTATIN) Apply 1 application topically daily. Apply to abdominal pannus and groin areas until yeast resolves.    Historical Provider, MD  ondansetron (ZOFRAN) 4 MG tablet Take 4 mg by mouth every 8 (eight) hours as needed for nausea or vomiting (nausea).     Historical Provider, MD  oxyCODONE-acetaminophen (PERCOCET) 10-325 MG per tablet Take one tablet by mouth every night at bedtime for pain. Take one tablet by mouth every 4 hours as needed for pain.  Do not exceed 4gm of Tylenol in 24hr 06/12/15   Estill Dooms, MD  OXYGEN Inhale 3 L into the lungs continuous.    Historical Provider, MD  polyethylene glycol (MIRALAX / GLYCOLAX) packet Take 17 g by mouth 2 (two) times daily.    Historical Provider, MD  risperiDONE (RISPERDAL) 1 MG/ML oral solution Take 0.5 mg by mouth 2 (two) times daily. Take .5mg  at 8am and 1mg  at 6pm for psychosis.    Historical  Provider, MD  senna (SENOKOT) 8.6 MG TABS tablet Take 1 tablet by mouth 2 (two) times daily.     Historical Provider, MD  tizanidine (ZANAFLEX) 2 MG capsule Take 2 mg by mouth 2 (two) times daily as needed for muscle spasms. Do not administer if patient is sedated.    Historical Provider, MD  traZODone (DESYREL) 50 MG tablet Take 50 mg by mouth at bedtime.    Historical Provider, MD  traZODone (DESYREL) 50 MG tablet Take 50 mg by mouth at bedtime as needed for sleep (If not asleep by midnight.).    Historical Provider, MD   Physical Exam: Filed Vitals:   09/09/15 2106 09/09/15 2130 09/09/15 2200 09/09/15 2234  BP: 112/36 108/34 107/36 105/40  Pulse: 71 70 70 60  Temp:    98.3 F (36.8 C)  TempSrc:    Oral  Resp:      Weight:    70.171 kg (154 lb 11.2 oz)  SpO2: 97% 100% 100% 100%     General: A/O 0, NAD, No acute respiratory distress Eyes: negative scleral hemorrhage ENT: Negative Runny nose,  Neck:  Negative scars, masses, torticollis, lymphadenopathy, JVD Lungs: Clear to auscultation bilaterally without wheezes or crackles Cardiovascular: Regular rate and rhythm without murmur gallop or rub normal S1 and S2 Abdomen:negative abdominal pain, Nontender, nondistended, soft, bowel sounds positive, no rebound, no ascites, no appreciable mass, pain to palpation right CVA Extremities: No significant cyanosis, clubbing, or edema bilateral lower extremities Psychiatric: Unable to assess  Neurologic: Unable to assess Skin/Hemolytic/lymphatic; negative purpura, petechia,     Labs on Admission:  Basic Metabolic Panel:  Recent Labs Lab 09/09/15 1935  NA 141  K 4.7  CL 101  CO2 30  GLUCOSE 120*  BUN 39*  CREATININE 3.18*  CALCIUM 9.1   Liver Function Tests:  Recent  Labs Lab 09/09/15 1935  AST 15  ALT 13*  ALKPHOS 41  BILITOT 0.4  PROT 5.8*  ALBUMIN 3.7   No results for input(s): LIPASE, AMYLASE in the last 168 hours. No results for input(s): AMMONIA in the last 168  hours. CBC:  Recent Labs Lab 09/09/15 1935  WBC 12.8*  NEUTROABS 10.4*  HGB 10.9*  HCT 35.2*  MCV 97.8  PLT 207   Cardiac Enzymes: No results for input(s): CKTOTAL, CKMB, CKMBINDEX, TROPONINI in the last 168 hours.  BNP (last 3 results) No results for input(s): BNP in the last 8760 hours.  ProBNP (last 3 results) No results for input(s): PROBNP in the last 8760 hours.  CBG: No results for input(s): GLUCAP in the last 168 hours.  Radiological Exams on Admission: Dg Chest 2 View  09/09/2015  CLINICAL DATA:  Shortness of breath, hypoxia, unresponsive EXAM: CHEST  2 VIEW COMPARISON:  11/25/2014 FINDINGS: Mild patchy right lower lobe opacity, atelectasis versus pneumonia. Left lung is clear. No pleural effusion or pneumothorax. Cardiomegaly. Degenerative changes of the visualized thoracolumbar spine. Right shoulder arthroplasty. IMPRESSION: Mild patchy right lower lobe opacity, atelectasis versus pneumonia. Electronically Signed   By: Julian Hy M.D.   On: 09/09/2015 18:09   Ct Head Wo Contrast  09/09/2015  CLINICAL DATA:  Altered mental status, lethargy EXAM: CT HEAD WITHOUT CONTRAST TECHNIQUE: Contiguous axial images were obtained from the base of the skull through the vertex without intravenous contrast. COMPARISON:  07/10/2014 FINDINGS: Calvarium intact. No significant inflammatory change in the visualized portions of the sinuses. There is age-appropriate atrophy and low attenuation in the deep white matter. No evidence of vascular territory infarct. No hemorrhage or extra-axial fluid. Partially calcified 1 cm mass arising off the inner table of the left parietal lobe image number 23 likely a meningioma. It is stable. IMPRESSION: Age-related involutional change with no significant acute abnormality. Electronically Signed   By: Skipper Cliche M.D.   On: 09/09/2015 18:44    EKG: N/A  Assessment/Plan Principal Problem:   UTI (lower urinary tract infection) Active  Problems:   Essential hypertension   Anxiety   Acute on chronic renal failure (HCC)   Acute encephalopathy   DM (diabetes mellitus) type II controlled with renal manifestation (HCC)   Seizure disorder (HCC)   Dementia with behavioral disturbance   Acute cystitis with hematuria   Altered mental status  UTI -Continue antibiotic for 5 day course -Urine culture pending  Acute on Chronic renal failure (baselineCr~1.6) -Continue to hydrate; normal saline 146ml/hr  Dementia/Anxiety -Continue Aricept 20 mg daily when able to take PO -Continue Lexapro 10 mg daily when able to take PO -Namenda 40 mg daily when able to take PO -Mirabegron 25 mg daily when able to take PO -Risperidone 0.5 mg BID when able to take PO  Seizure DO  -Keppra 500 mg BID  HTN -Patient's BP has been soft hold all blood pressure medication -Continue to hydrate normal saline 124ml/hr -Strict in and out -Daily weight  Pulmonary Hypertension -See HTN  Diabetes Type 2 -A1c pending -Sensitive SSI    Code Status: DO NOT RESUSCITATE Family Communication: Daughter present during exam  Disposition Plan: Resolution UTI   Care during the described time interval was provided by me .  I have reviewed this patient's available data, including medical history, events of note, physical examination, and all test results as part of my evaluation. I have personally reviewed and interpreted all radiology studies.    Time spent: 44  minutes  Allie Bossier [facility] Pager [phone]  If 7PM-7AM, please contact night-coverage [email or web address] Password [email or web address] 09/09/2015, 11:25 PM

## 2015-09-09 NOTE — ED Notes (Signed)
Report attempted 

## 2015-09-10 ENCOUNTER — Observation Stay (HOSPITAL_COMMUNITY): Payer: Medicare Other

## 2015-09-10 ENCOUNTER — Encounter (HOSPITAL_COMMUNITY): Payer: Self-pay | Admitting: General Practice

## 2015-09-10 DIAGNOSIS — N179 Acute kidney failure, unspecified: Secondary | ICD-10-CM | POA: Diagnosis present

## 2015-09-10 DIAGNOSIS — E860 Dehydration: Secondary | ICD-10-CM | POA: Diagnosis present

## 2015-09-10 DIAGNOSIS — E875 Hyperkalemia: Secondary | ICD-10-CM | POA: Diagnosis present

## 2015-09-10 DIAGNOSIS — I959 Hypotension, unspecified: Secondary | ICD-10-CM | POA: Diagnosis present

## 2015-09-10 DIAGNOSIS — Z794 Long term (current) use of insulin: Secondary | ICD-10-CM | POA: Diagnosis not present

## 2015-09-10 DIAGNOSIS — Z66 Do not resuscitate: Secondary | ICD-10-CM | POA: Diagnosis present

## 2015-09-10 DIAGNOSIS — Z8673 Personal history of transient ischemic attack (TIA), and cerebral infarction without residual deficits: Secondary | ICD-10-CM | POA: Diagnosis not present

## 2015-09-10 DIAGNOSIS — I10 Essential (primary) hypertension: Secondary | ICD-10-CM | POA: Diagnosis not present

## 2015-09-10 DIAGNOSIS — F0391 Unspecified dementia with behavioral disturbance: Secondary | ICD-10-CM | POA: Diagnosis present

## 2015-09-10 DIAGNOSIS — E870 Hyperosmolality and hypernatremia: Secondary | ICD-10-CM | POA: Diagnosis present

## 2015-09-10 DIAGNOSIS — N3001 Acute cystitis with hematuria: Secondary | ICD-10-CM | POA: Diagnosis present

## 2015-09-10 DIAGNOSIS — G40909 Epilepsy, unspecified, not intractable, without status epilepticus: Secondary | ICD-10-CM | POA: Diagnosis not present

## 2015-09-10 DIAGNOSIS — G92 Toxic encephalopathy: Secondary | ICD-10-CM | POA: Diagnosis present

## 2015-09-10 DIAGNOSIS — J449 Chronic obstructive pulmonary disease, unspecified: Secondary | ICD-10-CM | POA: Diagnosis present

## 2015-09-10 DIAGNOSIS — G9340 Encephalopathy, unspecified: Secondary | ICD-10-CM | POA: Diagnosis not present

## 2015-09-10 DIAGNOSIS — Z515 Encounter for palliative care: Secondary | ICD-10-CM | POA: Diagnosis not present

## 2015-09-10 DIAGNOSIS — J690 Pneumonitis due to inhalation of food and vomit: Secondary | ICD-10-CM | POA: Diagnosis not present

## 2015-09-10 DIAGNOSIS — N390 Urinary tract infection, site not specified: Secondary | ICD-10-CM | POA: Diagnosis not present

## 2015-09-10 DIAGNOSIS — D638 Anemia in other chronic diseases classified elsewhere: Secondary | ICD-10-CM | POA: Diagnosis present

## 2015-09-10 DIAGNOSIS — N189 Chronic kidney disease, unspecified: Secondary | ICD-10-CM | POA: Diagnosis present

## 2015-09-10 DIAGNOSIS — E1122 Type 2 diabetes mellitus with diabetic chronic kidney disease: Secondary | ICD-10-CM | POA: Diagnosis present

## 2015-09-10 DIAGNOSIS — I480 Paroxysmal atrial fibrillation: Secondary | ICD-10-CM | POA: Diagnosis not present

## 2015-09-10 DIAGNOSIS — I272 Other secondary pulmonary hypertension: Secondary | ICD-10-CM | POA: Diagnosis present

## 2015-09-10 DIAGNOSIS — R4182 Altered mental status, unspecified: Secondary | ICD-10-CM | POA: Diagnosis present

## 2015-09-10 DIAGNOSIS — F419 Anxiety disorder, unspecified: Secondary | ICD-10-CM | POA: Diagnosis not present

## 2015-09-10 DIAGNOSIS — N3090 Cystitis, unspecified without hematuria: Secondary | ICD-10-CM

## 2015-09-10 DIAGNOSIS — D631 Anemia in chronic kidney disease: Secondary | ICD-10-CM | POA: Diagnosis present

## 2015-09-10 DIAGNOSIS — J9811 Atelectasis: Secondary | ICD-10-CM | POA: Diagnosis present

## 2015-09-10 DIAGNOSIS — Z87440 Personal history of urinary (tract) infections: Secondary | ICD-10-CM | POA: Diagnosis not present

## 2015-09-10 HISTORY — DX: Cystitis, unspecified without hematuria: N30.90

## 2015-09-10 HISTORY — DX: Acute kidney failure, unspecified: N17.9

## 2015-09-10 LAB — CBC WITH DIFFERENTIAL/PLATELET
BASOS ABS: 0 10*3/uL (ref 0.0–0.1)
Basophils Relative: 0 %
Eosinophils Absolute: 0 10*3/uL (ref 0.0–0.7)
Eosinophils Relative: 0 %
HEMATOCRIT: 33.1 % — AB (ref 36.0–46.0)
HEMOGLOBIN: 10.4 g/dL — AB (ref 12.0–15.0)
LYMPHS PCT: 5 %
Lymphs Abs: 1 10*3/uL (ref 0.7–4.0)
MCH: 31.5 pg (ref 26.0–34.0)
MCHC: 31.4 g/dL (ref 30.0–36.0)
MCV: 100.3 fL — AB (ref 78.0–100.0)
Monocytes Absolute: 1.2 10*3/uL — ABNORMAL HIGH (ref 0.1–1.0)
Monocytes Relative: 6 %
NEUTROS ABS: 17.5 10*3/uL — AB (ref 1.7–7.7)
NEUTROS PCT: 89 %
PLATELETS: 222 10*3/uL (ref 150–400)
RBC: 3.3 MIL/uL — AB (ref 3.87–5.11)
RDW: 15 % (ref 11.5–15.5)
WBC: 19.7 10*3/uL — AB (ref 4.0–10.5)

## 2015-09-10 LAB — MAGNESIUM: MAGNESIUM: 1.5 mg/dL — AB (ref 1.7–2.4)

## 2015-09-10 LAB — COMPREHENSIVE METABOLIC PANEL
ALT: 12 U/L — ABNORMAL LOW (ref 14–54)
AST: 76 U/L — ABNORMAL HIGH (ref 15–41)
Albumin: 3.3 g/dL — ABNORMAL LOW (ref 3.5–5.0)
Alkaline Phosphatase: 40 U/L (ref 38–126)
Anion gap: 9 (ref 5–15)
BUN: 40 mg/dL — ABNORMAL HIGH (ref 6–20)
CHLORIDE: 109 mmol/L (ref 101–111)
CO2: 32 mmol/L (ref 22–32)
Calcium: 8.9 mg/dL (ref 8.9–10.3)
Creatinine, Ser: 3.01 mg/dL — ABNORMAL HIGH (ref 0.44–1.00)
GFR, EST AFRICAN AMERICAN: 15 mL/min — AB (ref >60)
GFR, EST NON AFRICAN AMERICAN: 13 mL/min — AB (ref >60)
Glucose, Bld: 145 mg/dL — ABNORMAL HIGH (ref 65–99)
POTASSIUM: 5.1 mmol/L (ref 3.5–5.1)
SODIUM: 150 mmol/L — AB (ref 135–145)
Total Bilirubin: 0.2 mg/dL — ABNORMAL LOW (ref 0.3–1.2)
Total Protein: 5.8 g/dL — ABNORMAL LOW (ref 6.5–8.1)

## 2015-09-10 LAB — BASIC METABOLIC PANEL
ANION GAP: 16 — AB (ref 5–15)
BUN: 43 mg/dL — AB (ref 6–20)
CO2: 22 mmol/L (ref 22–32)
Calcium: 8.8 mg/dL — ABNORMAL LOW (ref 8.9–10.3)
Chloride: 115 mmol/L — ABNORMAL HIGH (ref 101–111)
Creatinine, Ser: 3.36 mg/dL — ABNORMAL HIGH (ref 0.44–1.00)
GFR calc Af Amer: 14 mL/min — ABNORMAL LOW (ref >60)
GFR, EST NON AFRICAN AMERICAN: 12 mL/min — AB (ref >60)
Glucose, Bld: 106 mg/dL — ABNORMAL HIGH (ref 65–99)
SODIUM: 153 mmol/L — AB (ref 135–145)

## 2015-09-10 LAB — LIPID PANEL
CHOL/HDL RATIO: 4.5 ratio
Cholesterol: 188 mg/dL (ref 0–200)
HDL: 42 mg/dL (ref >40)
LDL CALC: 125 mg/dL — AB (ref 0–99)
Triglycerides: 106 mg/dL (ref <150)
VLDL: 21 mg/dL (ref 0–40)

## 2015-09-10 LAB — GLUCOSE, CAPILLARY
GLUCOSE-CAPILLARY: 119 mg/dL — AB (ref 65–99)
GLUCOSE-CAPILLARY: 121 mg/dL — AB (ref 65–99)
GLUCOSE-CAPILLARY: 128 mg/dL — AB (ref 65–99)
GLUCOSE-CAPILLARY: 151 mg/dL — AB (ref 65–99)
Glucose-Capillary: 108 mg/dL — ABNORMAL HIGH (ref 65–99)
Glucose-Capillary: 120 mg/dL — ABNORMAL HIGH (ref 65–99)

## 2015-09-10 LAB — URINE CULTURE: CULTURE: NO GROWTH

## 2015-09-10 LAB — LACTIC ACID, PLASMA: LACTIC ACID, VENOUS: 1.2 mmol/L (ref 0.5–2.0)

## 2015-09-10 LAB — PATHOLOGIST SMEAR REVIEW: Path Review: NONE SEEN

## 2015-09-10 MED ORDER — FUROSEMIDE 10 MG/ML IJ SOLN
20.0000 mg | Freq: Once | INTRAMUSCULAR | Status: AC
  Administered 2015-09-10: 20 mg via INTRAVENOUS

## 2015-09-10 MED ORDER — DEXTROSE 5 % IV SOLN
INTRAVENOUS | Status: DC
  Administered 2015-09-10: 12:00:00 via INTRAVENOUS

## 2015-09-10 MED ORDER — FUROSEMIDE 10 MG/ML IJ SOLN
20.0000 mg | Freq: Once | INTRAMUSCULAR | Status: AC
  Administered 2015-09-11: 20 mg via INTRAVENOUS
  Filled 2015-09-10: qty 2

## 2015-09-10 MED ORDER — FUROSEMIDE 10 MG/ML IJ SOLN
INTRAMUSCULAR | Status: AC
  Filled 2015-09-10: qty 2

## 2015-09-10 MED ORDER — NALOXONE HCL 0.4 MG/ML IJ SOLN
0.4000 mg | INTRAMUSCULAR | Status: DC | PRN
  Administered 2015-09-10: 0.4 mg via INTRAVENOUS
  Filled 2015-09-10: qty 1

## 2015-09-10 MED ORDER — CETYLPYRIDINIUM CHLORIDE 0.05 % MT LIQD
7.0000 mL | Freq: Two times a day (BID) | OROMUCOSAL | Status: DC
  Administered 2015-09-10 – 2015-09-18 (×16): 7 mL via OROMUCOSAL

## 2015-09-10 MED ORDER — RISPERIDONE 0.5 MG PO TABS
0.5000 mg | ORAL_TABLET | Freq: Every day | ORAL | Status: DC
  Administered 2015-09-10: 0.5 mg via ORAL
  Filled 2015-09-10 (×4): qty 1

## 2015-09-10 MED ORDER — RISPERIDONE 1 MG/ML PO SOLN
1.0000 mg | Freq: Every day | ORAL | Status: DC
  Administered 2015-09-10: 1 mg via ORAL
  Filled 2015-09-10 (×3): qty 1

## 2015-09-10 MED ORDER — DEXTROSE 5 % IV SOLN
500.0000 mg | INTRAVENOUS | Status: DC
  Administered 2015-09-10: 500 mg via INTRAVENOUS
  Filled 2015-09-10 (×2): qty 500

## 2015-09-10 NOTE — Progress Notes (Signed)
Lab called potassium value greater then 7.5 but it is hemolyzed. Dr. Verlon Au notified

## 2015-09-10 NOTE — Progress Notes (Signed)
LASHEBA STEVENS VVO:[id number] DOB: Jan 29, 1930 DOA: 09/09/2015 PCP: Blanchie Serve, MD  Brief narrative:  81 y/o ? Sz disorder on Keppra  Ty II DM Frequent UTI Aocd Prior pancreatic lesion-benign reported lesion Colonoscopy 08/28/12=polyps/diverticula LVH   Past medical history-As per Problem list Chart reviewed as below-   Consultants:  none  Procedures:  none  Antibiotics:  Ceftriaxone   Subjective  Sitting in bed but not alert nor oriented Snoring although eyes are opened while  snoring Sternal rub caused her to wince Daughter reports some change in status with giving Narcan    Objective    Interim History:   Telemetry:    Objective: Filed Vitals:   09/09/15 2130 09/09/15 2200 09/09/15 2234 09/10/15 0651  BP: 108/34 107/36 105/40 104/37  Pulse: 70 70 60 73  Temp:   98.3 F (36.8 C) 97.7 F (36.5 C)  TempSrc:   Oral Oral  Resp:    19  Weight:   70.171 kg (154 lb 11.2 oz)   SpO2: 100% 100% 100% 100%    Intake/Output Summary (Last 24 hours) at 09/10/15 1056 Last data filed at 09/10/15 0600  Gross per 24 hour  Intake    955 ml  Output      0 ml  Net    955 ml    Exam:  General:  EOMI NCAT Cardiovascular:  S1-S2 no murmur or gallop Respiratory:  Clinically clear but poor exam Abdomen:  Soft nontender   Data Reviewed: Basic Metabolic Panel:  Recent Labs Lab 09/09/15 1935 09/10/15 0445  NA 141 150*  K 4.7 5.1  CL 101 109  CO2 30 32  GLUCOSE 120* 145*  BUN 39* 40*  CREATININE 3.18* 3.01*  CALCIUM 9.1 8.9  MG  --  1.5*   Liver Function Tests:  Recent Labs Lab 09/09/15 1935 09/10/15 0445  AST 15 76*  ALT 13* 12*  ALKPHOS 41 40  BILITOT 0.4 0.2*  PROT 5.8* 5.8*  ALBUMIN 3.7 3.3*   No results for input(s): LIPASE, AMYLASE in the last 168 hours. No results for input(s): AMMONIA in the last 168 hours. CBC:  Recent Labs Lab 09/09/15 1935 09/10/15 0445  WBC 12.8* 19.7*  NEUTROABS 10.4* 17.5*  HGB 10.9* 10.4*   HCT 35.2* 33.1*  MCV 97.8 100.3*  PLT 207 222   Cardiac Enzymes: No results for input(s): CKTOTAL, CKMB, CKMBINDEX, TROPONINI in the last 168 hours. BNP: Invalid input(s): POCBNP CBG:  Recent Labs Lab 09/10/15 0043 09/10/15 0733  GLUCAP 120* 119*    No results found for this or any previous visit (from the past 240 hour(s)).   Studies:              All Imaging reviewed and is as per above notation   Scheduled Meds: . cefTRIAXone (ROCEPHIN)  IV  1 g Intravenous Q24H  . donepezil  20 mg Oral QHS  . enoxaparin (LOVENOX) injection  30 mg Subcutaneous Q24H  . escitalopram  10 mg Oral Daily  . feeding supplement (GLUCERNA SHAKE)  237 mL Oral BID WC  . ferrous sulfate  325 mg Oral BID WC  . furosemide  10 mg Oral Daily  . insulin aspart  0-9 Units Subcutaneous 6 times per day  . levETIRAcetam  500 mg Intravenous Q12H  . Melatonin  3 mg Oral QHS  . memantine  14 mg Oral Daily  . mirabegron ER  25 mg Oral Daily  . nystatin cream  1 application Topical Daily  .  polyethylene glycol  17 g Oral BID  . risperiDONE  1 mg Oral QHS   And  . risperiDONE  0.5 mg Oral Daily  . senna  1 tablet Oral BID   Continuous Infusions: . sodium chloride 100 mL/hr (09/09/15 2130)     Assessment/Plan:  1. Toxic metabolic encephalopathy without specific cause.  Could be pyelonephritis versus medication effect. We will give 1 more dose of Narcan. I will hold off on Aricept 20, Lexapro 10, Namenda 10 mg, Ativan 0.25 every 8 when necessary, Percocet and all other nonessential medications.  we will continue only Risperdal 0.5 a.m., 1 mg p.m.Marland Kitchen  Urine culture pending 2. Possible pyelonephritis-continue Rocephin until urine cultures returned 3. Acute kidney injury-Baseline creatinine 1.7, currently 3 range. Could also contribute to metabolic encephalopathy. We'll monitor. No lactic acidosis 4. Hypernatremia-probably secondary to poor by mouth intake. We'll change fluids to D5 at 50 cc per hour and check  a bmet in the morning as she is a hard stick. 5. Seizure disorder-continue Keppra 500 as IV dosing for now might lower dose in a.m. if no awakening 6. Hypotension-all blood pressure medications have been held 7. Diabetes mellitus type 2-not awake or alert enough to take by mouth therefore check every 4 hourly, range of blood sugars has been 120-145 so far   Full CODE STATUS Discussed with daughter at bedside Inpatient pending resolution  Verneita Griffes, MD  [facility] Pager [phone] 09/10/2015, 10:56 AM

## 2015-09-10 NOTE — Progress Notes (Signed)
Patient's O2 sats dropped to 75% on 3 L.Marland KitchenMarland KitchenPut patient on a non rebreather..O2 sats came up to 95%. Dr. Verlon Au notified and orders received

## 2015-09-10 NOTE — Progress Notes (Signed)
Dr. Verlon Au notified of chest xray results..Orders received

## 2015-09-10 NOTE — Progress Notes (Signed)
Narcan given to patient. Patient jerking. Dr. Verlon Au called.

## 2015-09-11 ENCOUNTER — Inpatient Hospital Stay (HOSPITAL_COMMUNITY): Payer: Medicare Other

## 2015-09-11 LAB — GLUCOSE, CAPILLARY
GLUCOSE-CAPILLARY: 106 mg/dL — AB (ref 65–99)
GLUCOSE-CAPILLARY: 124 mg/dL — AB (ref 65–99)
GLUCOSE-CAPILLARY: 145 mg/dL — AB (ref 65–99)
GLUCOSE-CAPILLARY: 93 mg/dL (ref 65–99)
Glucose-Capillary: 152 mg/dL — ABNORMAL HIGH (ref 65–99)
Glucose-Capillary: 129 mg/dL — ABNORMAL HIGH (ref 65–99)

## 2015-09-11 LAB — BASIC METABOLIC PANEL
Anion gap: 11 (ref 5–15)
BUN: 47 mg/dL — AB (ref 6–20)
CHLORIDE: 107 mmol/L (ref 101–111)
CO2: 25 mmol/L (ref 22–32)
CREATININE: 3.98 mg/dL — AB (ref 0.44–1.00)
Calcium: 8 mg/dL — ABNORMAL LOW (ref 8.9–10.3)
GFR calc Af Amer: 11 mL/min — ABNORMAL LOW (ref >60)
GFR calc non Af Amer: 10 mL/min — ABNORMAL LOW (ref >60)
GLUCOSE: 100 mg/dL — AB (ref 65–99)
POTASSIUM: 5.9 mmol/L — AB (ref 3.5–5.1)
SODIUM: 143 mmol/L (ref 135–145)

## 2015-09-11 LAB — HEMOGLOBIN A1C
HEMOGLOBIN A1C: 6 % — AB (ref 4.8–5.6)
MEAN PLASMA GLUCOSE: 126 mg/dL

## 2015-09-11 MED ORDER — ALBUTEROL SULFATE (2.5 MG/3ML) 0.083% IN NEBU
2.5000 mg | INHALATION_SOLUTION | RESPIRATORY_TRACT | Status: DC | PRN
  Administered 2015-09-11 – 2015-09-15 (×3): 2.5 mg via RESPIRATORY_TRACT
  Filled 2015-09-11 (×3): qty 3

## 2015-09-11 MED ORDER — DEXTROSE 5 % IV SOLN
INTRAVENOUS | Status: DC
  Administered 2015-09-11 – 2015-09-17 (×8): via INTRAVENOUS

## 2015-09-11 MED ORDER — SODIUM POLYSTYRENE SULFONATE 15 GM/60ML PO SUSP
15.0000 g | Freq: Once | ORAL | Status: AC
  Administered 2015-09-11: 15 g via RECTAL
  Filled 2015-09-11: qty 60

## 2015-09-11 MED ORDER — PIPERACILLIN-TAZOBACTAM IN DEX 2-0.25 GM/50ML IV SOLN
2.2500 g | Freq: Three times a day (TID) | INTRAVENOUS | Status: DC
  Administered 2015-09-11 – 2015-09-14 (×9): 2.25 g via INTRAVENOUS
  Filled 2015-09-11 (×11): qty 50

## 2015-09-11 MED ORDER — ALBUTEROL SULFATE (2.5 MG/3ML) 0.083% IN NEBU
INHALATION_SOLUTION | RESPIRATORY_TRACT | Status: AC
  Administered 2015-09-11: 08:00:00
  Filled 2015-09-11: qty 3

## 2015-09-11 NOTE — Clinical Social Work Placement (Signed)
   CLINICAL SOCIAL WORK PLACEMENT  NOTE  Date:  09/11/2015  Patient Details  Name: Linda [name] MRN: [id number] Date of Birth: 07/09/1930  Clinical Social Work is seeking post-discharge placement for this patient at the [place] level of care (*CSW will initial, date and re-position this form in  chart as items are completed):  Yes   Patient/family provided with [place] Work Department's list of facilities offering this level of care within the geographic area requested by the patient (or if unable, by the patient's family).  Yes   Patient/family informed of their freedom to choose among providers that offer the needed level of care, that participate in Medicare, Medicaid or managed care program needed by the patient, have an available bed and are willing to accept the patient.  Yes   Patient/family informed of [facility]'s ownership interest in [facility] and [facility], as well as of the fact that they are under no obligation to receive care at these facilities.  PASRR submitted to EDS on       PASRR number received on       Existing PASRR number confirmed on       FL2 transmitted to all facilities in geographic area requested by pt/family on       FL2 transmitted to all facilities within larger geographic area on       Patient informed that his/her managed care company has contracts with or will negotiate with certain facilities, including the following:            Patient/family informed of bed offers received.  Patient chooses bed at       Physician recommends and patient chooses bed at      Patient to be transferred to   on  .  Patient to be transferred to facility by       Patient family notified on   of transfer.  Name of family member notified:        PHYSICIAN       Additional Comment:    _______________________________________________ Leane Call, Student-SW 09/11/2015, 3:08 PM

## 2015-09-11 NOTE — Progress Notes (Signed)
Linda [name] YKD:[id number] DOB: July 17, 1930 DOA: 09/09/2015 PCP: Blanchie Serve, MD  Brief narrative:  81 y/o ? Sz disorder on Keppra  Ty II DM Frequent UTI Aocd Prior pancreatic lesion-benign reported lesion Colonoscopy 08/28/12=polyps/diverticula LVH  Admitted with toxic metabolic encephalopathy and confusion with presumed UTI   Past medical history-As per Problem list Chart reviewed as below-   Consultants:  none  Procedures:  none  Antibiotics:  Ceftriaxone   Subjective   Poorly responsive but better than prior No n/v/cp reported but note drop in sats and use of NRB Daughter states more alert and oriented than prior   Objective    Interim History:   Telemetry:    Objective: Filed Vitals:   09/10/15 1755 09/10/15 2038 09/11/15 0431 09/11/15 1122  BP:  114/41 102/42   Pulse:  100 100   Temp:  99.3 F (37.4 C) 98.6 F (37 C)   TempSrc:  Oral Axillary   Resp:  19 14   Weight:      SpO2: 95% 100% 100% 100%    Intake/Output Summary (Last 24 hours) at 09/11/15 1152 Last data filed at 09/10/15 1900  Gross per 24 hour  Intake    150 ml  Output      0 ml  Net    150 ml    Exam:  General:  EOMI NCAT Cardiovascular:  S1-S2 no murmur or gallop Respiratory:  Clinically clear but poor exam Abdomen:  Soft nontender   Data Reviewed: Basic Metabolic Panel:  Recent Labs Lab 09/09/15 1935 09/10/15 0445 09/10/15 1114 09/11/15 0438  NA 141 150* 153* 143  K 4.7 5.1 >7.5* 5.9*  CL 101 109 115* 107  CO2 30 32 22 25  GLUCOSE 120* 145* 106* 100*  BUN 39* 40* 43* 47*  CREATININE 3.18* 3.01* 3.36* 3.98*  CALCIUM 9.1 8.9 8.8* 8.0*  MG  --  1.5*  --   --    Liver Function Tests:  Recent Labs Lab 09/09/15 1935 09/10/15 0445  AST 15 76*  ALT 13* 12*  ALKPHOS 41 40  BILITOT 0.4 0.2*  PROT 5.8* 5.8*  ALBUMIN 3.7 3.3*   No results for input(s): LIPASE, AMYLASE in the last 168 hours. No results for input(s): AMMONIA in the last 168  hours. CBC:  Recent Labs Lab 09/09/15 1935 09/10/15 0445  WBC 12.8* 19.7*  NEUTROABS 10.4* 17.5*  HGB 10.9* 10.4*  HCT 35.2* 33.1*  MCV 97.8 100.3*  PLT 207 222   Cardiac Enzymes: No results for input(s): CKTOTAL, CKMB, CKMBINDEX, TROPONINI in the last 168 hours. BNP: Invalid input(s): POCBNP CBG:  Recent Labs Lab 09/10/15 2009 09/10/15 2038 09/11/15 0007 09/11/15 0431 09/11/15 0739  GLUCAP 108* 151* 145* 106* 93    Recent Results (from the past 240 hour(s))  Urine culture     Status: None   Collection Time: 09/09/15  6:55 PM  Result Value Ref Range Status   Specimen Description URINE, RANDOM  Final   Special Requests NONE  Final   Culture NO GROWTH 1 DAY  Final   Report Status 09/10/2015 FINAL  Final     Studies:              All Imaging reviewed and is as per above notation   Scheduled Meds: . antiseptic oral rinse  7 mL Mouth Rinse BID  . azithromycin  500 mg Intravenous Q24H  . cefTRIAXone (ROCEPHIN)  IV  1 g Intravenous Q24H  . enoxaparin (LOVENOX) injection  30 mg Subcutaneous Q24H  . feeding supplement (GLUCERNA SHAKE)  237 mL Oral BID WC  . ferrous sulfate  325 mg Oral BID WC  . furosemide  10 mg Oral Daily  . insulin aspart  0-9 Units Subcutaneous 6 times per day  . levETIRAcetam  500 mg Intravenous Q12H  . Melatonin  3 mg Oral QHS  . nystatin cream  1 application Topical Daily  . risperiDONE  1 mg Oral QHS   And  . risperiDONE  0.5 mg Oral Daily  . senna  1 tablet Oral BID   Continuous Infusions:     Assessment/Plan:  1. Toxic metabolic encephalopathy without specific cause.  NG on UC so likely medication effect superimposed on AKI. I will hold off on Aricept 20, Lexapro 10, Namenda 10 mg, Ativan 0.25 every 8 when necessary, Percocet and all other nonessential medications.  we will continue only Risperdal 0.5 a.m., 1 mg p.m..   2. Possibel aspiration-transition azithrp and rocephin to zosyn 3. Possible pyelonephritis- d/c Rocephin as urine  culture did not show any new findings 4. Acute kidney injury with hyperkalemia-Baseline creatinine 1.7, currently 3 range. And worsening. Obtain urine electrolytes, get renal ultrasound and give Kayexalate rectally 5. Hypernatremia-probably secondary to poor by mouth intake. D5 slaine 40 cc /hr effort to improve creat 6. Seizure disorder-continue Keppra 500 as IV dosing for now might lower dose in a.m. if no awakening 7. Hypotension-all blood pressure medications have been held 8. Diabetes mellitus type 2-not awake or alert enough to take by mouth therefore check every 4 hourly, range of blood sugars has been 93-151 so need to keep on D5 for now   DNR Discussed with daughter at bedside Inpatient pending resolution Poor overall prognosis-if no resolution of mental status would consult pallaitive care in next couple of days Doubt patient gr8 candidate for dialysis and therefore have not consulted nephrology   Verneita Griffes, MD  [facility] Pager [phone] 09/11/2015, 11:52 AM    LOS: 1 day

## 2015-09-11 NOTE — Progress Notes (Signed)
Initial Nutrition Assessment  DOCUMENTATION CODES:   Not applicable  INTERVENTION:   -RD will follow for diet advancement and make further recommendations based upon GOC  NUTRITION DIAGNOSIS:   Inadequate oral intake related to inability to eat as evidenced by NPO status.  GOAL:   Patient will meet greater than or equal to 90% of their needs  MONITOR:   Diet advancement, Labs, Weight trends, Skin, I & O's  REASON FOR ASSESSMENT:   Malnutrition Screening Tool    ASSESSMENT:   Brought to the emergency department for altered mental status noted by family members while at the nursing home. Family reports that when they came into the patient's room the patient was staring straight ahead and not responsive which is abnormal for her. Family also reports that she was not wearing her oxygen which she is supposed to wear continuously and that her oxygen level was low so more around 70%. EMS reports that when they presented to the patient's bedside she was not very responsive but there was a report that she just received opioid medication for pain. EMS gave Narcan with improvement in her mental status. After Narcan the patient became nauseated and vomited. Both EMS and family reports that the patient is doing much better at this time. Family reports this is all the patient normally acts when she has a urinary tract infection. Borderline blood pressure 97/44 on arrival to the ER. Patient is a DO NOT RESUSCITATE.  Pt admitted to UTI and toxic metabolic encephalopathy.   Spoke with RN, who reports that pt remains on non-rebreather mask, due to pt's O2 sats dropping into the 70-80's when attempting to wean. She reports that pt has poor prognosis and MD reports plan is to get palliative care involved to discussed goals of care. Per RN, pt will likely transition to comfort care.   Pt is NPO. RD will make further recommendations based upon goals of care. Nutrition-focused physical exam  deferred at this time.   Labs reviewed: K: 5.9.   Diet Order:  Diet NPO time specified  Skin:  Wound (see comment) (MASD groin and coccyx)  Last BM:  09/08/15  Height:   Ht Readings from Last 1 Encounters:  06/07/15 5\' 5"  (1.651 m)    Weight:   Wt Readings from Last 1 Encounters:  09/09/15 154 lb 11.2 oz (70.171 kg)    Ideal Body Weight:  56.8 kg  BMI:  Body mass index is 25.74 kg/(m^2).  Estimated Nutritional Needs:   Kcal:  1500-1700  Protein:  70-80 grams  Fluid:  1.5-1.7 L  EDUCATION NEEDS:   No education needs identified at this time  Madison Albea A. Jimmye Norman, RD, LDN, CDE Pager: [phone] After hours Pager: [phone]

## 2015-09-11 NOTE — Progress Notes (Signed)
ANTIBIOTIC CONSULT NOTE - INITIAL  Pharmacy Consult for Zosyn Indication: aspiration PNA  Allergies  Allergen Reactions  . Amoxicillin Other (See Comments)    aching all over, has tolerated Zosyn - listed as allergy on [facility] 09/09/15  . Atorvastatin Other (See Comments)    REACTION: increased CPK - not listed on [facility] 09/09/15  . Cephalexin Nausea Only and Other (See Comments)    Weak, Headache, has tolerated ceftriaxone - listed as allergy on [facility] 09/09/15  . Cetirizine Hcl Other (See Comments)    REACTION: makes her head feel big - not listed on [facility] 09/09/15  . Cortisone Other (See Comments)    REACTION: increased bp - not listed on [facility] 09/09/15  . Ezetimibe-Simvastatin Other (See Comments)    Unknown - not listed on [facility] 09/09/15  . Fentanyl Other (See Comments)    REACTION: stinging in her skin - not listed on [facility] 09/09/15  . Guaifenesin Other (See Comments)    Unknown - not listed on [facility] 09/09/15  . Ketorolac Tromethamine Other (See Comments)    REACTION: not work - not listed on [facility] 09/09/15  . Lisinopril Cough    ACE related - listed as allergy on [facility] 09/09/15  . Metformin Other (See Comments)    Epistaxis - not listed on [facility] 09/09/15  . Nsaids Other (See Comments)    Epistaxis - listed as allergy on [facility] 09/09/15  . Penicillins Nausea Only and Other (See Comments)    Headache, has tolerated Zosyn - listed as allergy on [facility] 09/09/15  . Rofecoxib Itching    Not listed on [facility] 09/09/15  . Rosuvastatin Other (See Comments)    Myalgias - not listed on [facility] 09/09/15  . Simvastatin Other (See Comments)    Unknown - not listed on [facility] 09/09/15  . Spironolactone Nausea Only    Listed as allergy on [facility] 09/09/15  . Sulfamethoxazole-Trimethoprim Other (See Comments)    Unknown - not listed on [facility] 09/09/15  . Codeine Rash    Not listed on [facility] 09/09/15  . Tape Rash    Not listed on [facility] 09/09/15    Patient Measurements: Weight: 154 lb 11.2 oz (70.171 kg)  Vital Signs: Temp: 98.6  F (37 C) (11/01 0431) Temp Source: Axillary (11/01 0431) BP: 102/42 mmHg (11/01 0431) Pulse Rate: 100 (11/01 0431) Intake/Output from previous day: 10/31 0701 - 11/01 0700 In: 150 [I.V.:150] Out: -  Intake/Output from this shift:    Labs:  Recent Labs  09/09/15 1935 09/10/15 0445 09/10/15 1114 09/11/15 0438  WBC 12.8* 19.7*  --   --   HGB 10.9* 10.4*  --   --   PLT 207 222  --   --   CREATININE 3.18* 3.01* 3.36* 3.98*   Estimated Creatinine Clearance: 10.5 mL/min (by C-G formula based on Cr of 3.98). No results for input(s): VANCOTROUGH, VANCOPEAK, VANCORANDOM, GENTTROUGH, GENTPEAK, GENTRANDOM, TOBRATROUGH, TOBRAPEAK, TOBRARND, AMIKACINPEAK, AMIKACINTROU, AMIKACIN in the last 72 hours.   Microbiology: Recent Results (from the past 720 hour(s))  Urine culture     Status: None   Collection Time: 09/09/15  6:55 PM  Result Value Ref Range Status   Specimen Description URINE, RANDOM  Final   Special Requests NONE  Final   Culture NO GROWTH 1 DAY  Final   Report Status 09/10/2015 FINAL  Final   Assessment: 68 YOF with seizure disorder here with encephalopathy. With presumed UTI and also suspected aspiration PNA. To start Zosyn.  10/31 CXR shows increase  in infiltrates.  SCr 3.98 with est CrCL ~49mL/min. WBC elevated at 19.7, afebrile right now.  10/31 BCx: sent 10/30 urine: negative  Goal of Therapy:  proper dosing based on renal and hepatic function  Plan:  -Zosyn 2.25g IV q8h -follow c/s, CXR, renal function, LOT, clinical progression  Jadarrius Maselli D. Gabriella Woodhead, PharmD, BCPS Clinical Pharmacist Pager: [phone] 09/11/2015 12:09 PM

## 2015-09-11 NOTE — Clinical Social Work Note (Signed)
Clinical Social Work Assessment  Patient Details  Name: Linda [name] MRN: [id number] Date of Birth: [birth year]  Date of referral:  09/11/15               Reason for consult:  Facility Placement, Discharge Planning                Permission sought to share information with:  Family Supports Permission granted to share information::  Yes, Verbal Permission Granted  Name::      (daughter, Silvestre Mesi)  Agency::   (SNF)  Relationship::   (daughter)  Contact Information:   [name]-[phone])  Housing/Transportation Living arrangements for the past 2 months:  [place] [employer] transport [employer]) Source of Information:  Adult Children Patient Interpreter Needed:  None Criminal Activity/Legal Involvement Pertinent to Current Situation/Hospitalization:  No - Comment as needed Significant Relationships:  Adult Children Lives with:  Facility Resident Do you feel safe going back to the place where you live?  No Need for family participation in patient care:  Yes (Comment)  Care giving concerns:   Patient's family did not express any care giving concerns at this time.   Social Worker assessment / plan:   Patient was disoriented x4. BSW intern spoke with patient's daughter, [name] at bedside to discuss discharge placement. Patient is from [facility] [place]. Patient's daughter informed BSW intern that patient has been a resident at this facility for over a year. Patient's daughter is aware of PT recommendations for patient to return to a SNF. BSW intern presented SNF list to patient's daughter for review. Patient's family prefers [facility] if facility extends bed offer. Patient's daughter stated that she would like to review SNF list with patient's other daughter as well before making a final decision for patient's placement. Patient's daughter expressed concerns with patient returning to [facility] however, was not refusing this as an option. BSW intern to follow  up with family for discharge placement decision. BSW remains available as needed.  Employment status:  Retired Forensic scientist:  Medicare PT Recommendations:  [place] / Referral to community resources:  [place]  Patient/Family's Response to care:   Patient's daughter was very calm and appropriate during assessment. Patient's family appreciated Social Work intervention from [employer].  Patient/Family's Understanding of and Emotional Response to Diagnosis, Current Treatment, and Prognosis:   Patient's daughter is aware of patient's need for continued medical care.  Emotional Assessment Appearance:  Appears stated age Attitude/Demeanor/Rapport:  Unable to Assess Affect (typically observed):  Unable to Assess Orientation:   (disoriented x4) Alcohol / Substance use:  Not Applicable Psych involvement (Current and /or in the community):  No (Comment)  Discharge Needs  Concerns to be addressed:  No discharge needs identified Readmission within the last 30 days:  No Current discharge risk:  None Barriers to Discharge:  No Barriers Identified   Leane Call, Student-SW 09/11/2015, 3:11 PM

## 2015-09-12 DIAGNOSIS — G40909 Epilepsy, unspecified, not intractable, without status epilepticus: Secondary | ICD-10-CM

## 2015-09-12 DIAGNOSIS — G9340 Encephalopathy, unspecified: Secondary | ICD-10-CM

## 2015-09-12 DIAGNOSIS — N179 Acute kidney failure, unspecified: Secondary | ICD-10-CM

## 2015-09-12 DIAGNOSIS — N390 Urinary tract infection, site not specified: Secondary | ICD-10-CM

## 2015-09-12 DIAGNOSIS — F0391 Unspecified dementia with behavioral disturbance: Secondary | ICD-10-CM

## 2015-09-12 LAB — CBC WITH DIFFERENTIAL/PLATELET
BASOS PCT: 0 %
Basophils Absolute: 0 10*3/uL (ref 0.0–0.1)
EOS ABS: 0 10*3/uL (ref 0.0–0.7)
Eosinophils Relative: 0 %
HEMATOCRIT: 31.3 % — AB (ref 36.0–46.0)
HEMOGLOBIN: 9.7 g/dL — AB (ref 12.0–15.0)
Lymphocytes Relative: 5 %
Lymphs Abs: 0.8 10*3/uL (ref 0.7–4.0)
MCH: 30.2 pg (ref 26.0–34.0)
MCHC: 31 g/dL (ref 30.0–36.0)
MCV: 97.5 fL (ref 78.0–100.0)
MONOS PCT: 3 %
Monocytes Absolute: 0.5 10*3/uL (ref 0.1–1.0)
NEUTROS ABS: 15.4 10*3/uL — AB (ref 1.7–7.7)
NEUTROS PCT: 92 %
Platelets: 195 10*3/uL (ref 150–400)
RBC: 3.21 MIL/uL — AB (ref 3.87–5.11)
RDW: 14.3 % (ref 11.5–15.5)
WBC: 16.8 10*3/uL — AB (ref 4.0–10.5)

## 2015-09-12 LAB — RENAL FUNCTION PANEL
ANION GAP: 12 (ref 5–15)
Albumin: 2.6 g/dL — ABNORMAL LOW (ref 3.5–5.0)
BUN: 67 mg/dL — ABNORMAL HIGH (ref 6–20)
CALCIUM: 8.6 mg/dL — AB (ref 8.9–10.3)
CHLORIDE: 108 mmol/L (ref 101–111)
CO2: 26 mmol/L (ref 22–32)
CREATININE: 4.22 mg/dL — AB (ref 0.44–1.00)
GFR, EST AFRICAN AMERICAN: 10 mL/min — AB (ref >60)
GFR, EST NON AFRICAN AMERICAN: 9 mL/min — AB (ref >60)
Glucose, Bld: 134 mg/dL — ABNORMAL HIGH (ref 65–99)
Phosphorus: 4 mg/dL (ref 2.5–4.6)
Potassium: 5.3 mmol/L — ABNORMAL HIGH (ref 3.5–5.1)
SODIUM: 146 mmol/L — AB (ref 135–145)

## 2015-09-12 LAB — GLUCOSE, CAPILLARY
GLUCOSE-CAPILLARY: 111 mg/dL — AB (ref 65–99)
GLUCOSE-CAPILLARY: 123 mg/dL — AB (ref 65–99)
GLUCOSE-CAPILLARY: 128 mg/dL — AB (ref 65–99)
GLUCOSE-CAPILLARY: 141 mg/dL — AB (ref 65–99)
GLUCOSE-CAPILLARY: 147 mg/dL — AB (ref 65–99)
GLUCOSE-CAPILLARY: 152 mg/dL — AB (ref 65–99)

## 2015-09-12 MED ORDER — METOPROLOL TARTRATE 1 MG/ML IV SOLN
INTRAVENOUS | Status: AC
  Filled 2015-09-12: qty 5

## 2015-09-12 MED ORDER — ONDANSETRON HCL 4 MG/2ML IJ SOLN: 4.0000 mg | Freq: Four times a day (QID) | INTRAMUSCULAR | Status: DC | PRN

## 2015-09-12 MED ORDER — SODIUM CHLORIDE 0.9 % IV BOLUS (SEPSIS)
500.0000 mL | Freq: Once | INTRAVENOUS | Status: AC
  Administered 2015-09-12: 500 mL via INTRAVENOUS

## 2015-09-12 MED ORDER — METOPROLOL TARTRATE 1 MG/ML IV SOLN
2.5000 mg | Freq: Four times a day (QID) | INTRAVENOUS | Status: DC
  Administered 2015-09-12 – 2015-09-13 (×3): 2.5 mg via INTRAVENOUS
  Filled 2015-09-12 (×4): qty 5

## 2015-09-12 MED ORDER — METOPROLOL TARTRATE 1 MG/ML IV SOLN
5.0000 mg | Freq: Once | INTRAVENOUS | Status: AC
  Administered 2015-09-12: 5 mg via INTRAVENOUS

## 2015-09-12 MED ORDER — SODIUM POLYSTYRENE SULFONATE 15 GM/60ML PO SUSP
15.0000 g | Freq: Once | ORAL | Status: AC
  Administered 2015-09-12: 15 g via RECTAL
  Filled 2015-09-12: qty 60

## 2015-09-12 MED ORDER — METOPROLOL TARTRATE 1 MG/ML IV SOLN: 2.5000 mg | Freq: Four times a day (QID) | INTRAVENOUS | Status: DC

## 2015-09-12 MED ORDER — SODIUM CHLORIDE 0.9 % IV BOLUS (SEPSIS)
250.0000 mL | Freq: Once | INTRAVENOUS | Status: AC
  Administered 2015-09-12: 250 mL via INTRAVENOUS

## 2015-09-12 MED ORDER — METOPROLOL TARTRATE 1 MG/ML IV SOLN
2.5000 mg | Freq: Once | INTRAVENOUS | Status: AC
  Administered 2015-09-12: 2.5 mg via INTRAVENOUS
  Filled 2015-09-12: qty 5

## 2015-09-12 NOTE — Progress Notes (Signed)
[facility] PROGRESS NOTE  Linda [name] UDJ:[id number] DOB: 25-Aug-1930 DOA: 09/09/2015  PCP: Blanchie Serve, MD  Brief HPI: 81 year old Caucasian female with a past medical history of dementia, seizure disorder, TIA, presented to the hospital with altered mental status. She lives in a skilled nursing facility. Patient is a DO NOT RESUSCITATE. Initially it was felt that her mental status changes could be due to narcotics. However, it appears to be secondary to infection and metabolic derangements.  Past medical history:  Past Medical History  Diagnosis Date  . Allergy     allergic rhinitis  . Diabetes mellitus     type II  . Hypertension   . Osteopenia   . TIA (transient ischemic attack)   . Contact dermatitis     on buttocks  . Nosebleed   . Anxiety   . Renal insufficiency   . Pyelonephritis   . Dementia   . Chiari malformation   . Seizure disorder ([place])   . ROTATOR CUFF TEAR 10/13/2007    Qualifier: Diagnosis of  By: Marcelino Scot CMA, [place]    . AKI (acute kidney injury) ([place]) 09/10/2015  . Cystitis 09/10/2015  . COPD, mild ([place])   . Arthritis     Consultants: None  Procedures: None  Antibiotics: Zosyn  Subjective: Patient is unresponsive to voice. Responds only to painful stimuli. Her daughter is at bedside.  Objective: Vital Signs  Filed Vitals:   09/12/15 0145 09/12/15 0533 09/12/15 0856 09/12/15 0906  BP: 121/54 97/55 154/122 100/57  Pulse: 144 120 155 115  Temp: 99.3 F (37.4 C) 97.9 F (36.6 C)    TempSrc: Axillary Axillary    Resp:  20    Weight:      SpO2: 97% 97% 98% 98%    Intake/Output Summary (Last 24 hours) at 09/12/15 1023 Last data filed at 09/12/15 0548  Gross per 24 hour  Intake    734 ml  Output      0 ml  Net    734 ml   Filed Weights   09/09/15 2234  Weight: 70.171 kg (154 lb 11.2 oz)    General appearance: Unresponsive Head: Normocephalic, without obvious abnormality, atraumatic Resp: Course breath sounds  bilaterally with rhonchi at the right base. Few crackles as well. Cardio: regular rate and rhythm, S1, S2 normal, no murmur, click, rub or gallop GI: soft, non-tender; bowel sounds normal; no masses,  no organomegaly Extremities: extremities normal, atraumatic, no cyanosis or edema Neurologic: Unresponsive.  Lab Results:  Basic Metabolic Panel:  Recent Labs Lab 09/09/15 1935 09/10/15 0445 09/10/15 1114 09/11/15 0438 09/12/15 0415  NA 141 150* 153* 143 146*  K 4.7 5.1 >7.5* 5.9* 5.3*  CL 101 109 115* 107 108  CO2 30 32 22 25 26   GLUCOSE 120* 145* 106* 100* 134*  BUN 39* 40* 43* 47* 67*  CREATININE 3.18* 3.01* 3.36* 3.98* 4.22*  CALCIUM 9.1 8.9 8.8* 8.0* 8.6*  MG  --  1.5*  --   --   --   PHOS  --   --   --   --  4.0   Liver Function Tests:  Recent Labs Lab 09/09/15 1935 09/10/15 0445 09/12/15 0415  AST 15 76*  --   ALT 13* 12*  --   ALKPHOS 41 40  --   BILITOT 0.4 0.2*  --   PROT 5.8* 5.8*  --   ALBUMIN 3.7 3.3* 2.6*   CBC:  Recent Labs Lab 09/09/15 1935 09/10/15 0445  09/12/15 0415  WBC 12.8* 19.7* 16.8*  NEUTROABS 10.4* 17.5* 15.4*  HGB 10.9* 10.4* 9.7*  HCT 35.2* 33.1* 31.3*  MCV 97.8 100.3* 97.5  PLT 207 222 195   CBG:  Recent Labs Lab 09/11/15 1802 09/11/15 2003 09/12/15 0009 09/12/15 0409 09/12/15 0754  GLUCAP 152* 129* 141* 128* 123*    Recent Results (from the past 240 hour(s))  Urine culture     Status: None   Collection Time: 09/09/15  6:55 PM  Result Value Ref Range Status   Specimen Description URINE, RANDOM  Final   Special Requests NONE  Final   Culture NO GROWTH 1 DAY  Final   Report Status 09/10/2015 FINAL  Final  Culture, blood (routine x 2)     Status: None (Preliminary result)   Collection Time: 09/10/15 12:04 AM  Result Value Ref Range Status   Specimen Description BLOOD LEFT HAND  Final   Special Requests IN PEDIATRIC BOTTLE 1CC  Final   Culture NO GROWTH 1 DAY  Final   Report Status PENDING  Incomplete  Culture,  blood (routine x 2)     Status: None (Preliminary result)   Collection Time: 09/10/15 12:10 AM  Result Value Ref Range Status   Specimen Description BLOOD RIGHT HAND  Final   Special Requests IN PEDIATRIC BOTTLE 1CC  Final   Culture NO GROWTH 1 DAY  Final   Report Status PENDING  Incomplete      Studies/Results: US Renal Port  09/11/2015  CLINICAL DATA:  81 year old female with a history of diabetes and acute kidney injury EXAM: RENAL / URINARY TRACT ULTRASOUND COMPLETE COMPARISON:  CT 04/11/2014, MR 04/13/2014 FINDINGS: Right Kidney: Length: 10.7 cm. Echogenicity similar to that of the adjacent liver. No hydronephrosis. Flow confirmed in the hilum of the right kidney. Left Kidney: Length: 9.2 cm. Echogenicity relatively increase compared to the adjacent spleen. Cortical thinning with lobulated contour, similar to comparison cross-sectional imaging. Anechoic structure on the inferior aspect of the left kidney without internal flow measures 1.9 cm x 1.3 cm x 1.6 cm, characterized as cysts on prior MR. Bladder: Hyperechoic material layer dependently within the urinary bladder. IMPRESSION: No evidence of hydronephrosis. Increased echogenicity of left kidney suggesting medical renal disease. Hyperechoic material layered dependently within urinary bladder, potentially debris, complex fluid, or blood. Correlation with urinalysis may be useful. Signed, Dulcy Fanny. Earleen Newport, DO [facility] Electronically Signed   By: Corrie Mckusick D.O.   On: 09/11/2015 19:40   Dg Chest Port 1 View  09/10/2015  CLINICAL DATA:  Hypoxia, shortness of breath EXAM: PORTABLE CHEST - 1 VIEW COMPARISON:  09/09/2015 FINDINGS: Cardiac shadow is mildly enlarged but stable. Significant increase in the degree of right basilar infiltrate is noted when compare with the previous day. Left lung remains clear. Degenerative changes of thoracic spine are noted. Postsurgical changes in  cervical spine and right shoulder are seen. IMPRESSION: Significant increase in right basilar infiltrate from the previous day. Electronically Signed   By: Inez Catalina M.D.   On: 09/10/2015 18:26    Medications:  Scheduled: . antiseptic oral rinse  7 mL Mouth Rinse BID  . enoxaparin (LOVENOX) injection  30 mg Subcutaneous Q24H  . insulin aspart  0-9 Units Subcutaneous 6 times per day  . levETIRAcetam  500 mg Intravenous Q12H  . metoprolol      . metoprolol  2.5 mg Intravenous 4 times per day  . nystatin cream  1 application Topical Daily  .  piperacillin-tazobactam (ZOSYN)  IV  2.25 g Intravenous 3 times per day  . sodium chloride  500 mL Intravenous Once   Continuous: . dextrose 100 mL/hr at 09/12/15 1022   HYI:FOYDXAJOINOMV **OR** acetaminophen, albuterol, ondansetron (ZOFRAN) IV  Assessment/Plan:  Principal Problem:   UTI (lower urinary tract infection) Active Problems:   Essential hypertension   Anxiety   Acute on chronic renal failure (HCC)   Acute encephalopathy   DM (diabetes mellitus) type II controlled with renal manifestation (HCC)   Seizure disorder (HCC)   Dementia with behavioral disturbance   Acute cystitis with hematuria   Altered mental status    Toxic metabolic encephalopathy Likely due to infection and renal failure. Patient's mental status is not improving. Continue to monitor closely. Not a candidate for aggressive workup.  UTI versus pyelonephritis Continue antibiotics. Urine cultures did not show any growth so far.  Aspiration pneumonia X-ray shows worsening right lower lobe infiltrate. Patient is likely aspirating. This was communicated to the patient's daughter. Continue Zosyn for now. Prognosis is poor.  Acute kidney injury Baseline creatinine is around 1.7. Creatinine appears to be getting worse with worsening BUN. She appears to be significantly volume depleted. Increase rate of IV fluids. Give her normal saline boluses. Renal ultrasound  report reviewed  Paroxysmal atrial fibrillation Heart rate was elevated in the 140s this morning. EKG suggests atrial fibrillation. She was given intravenous metoprolol with which she converted back to sinus rhythm. Onset of Atrial fibrillation likely due to acute illness. She is not a candidate for further workup or for anticoagulation due to her advanced dementia.  History of seizure disorder Continue intravenous Keppra  Hypotension in the setting of known essential hypertension Blood pressure medications on hold. Give fluid boluses.  Type 2 diabetes Monitor blood sugars.  Hypernatremia Secondary to dehydration. Continue IV fluids.  History of dementia Continue to monitor.   DVT Prophylaxis: Lovenox    Code Status: DO NOT RESUSCITATE  Family Communication: Discussed with the patient's daughter  Disposition Plan: Continue current management. Prognosis is poor. This was communicated to the daughter. If she does not improve or continues to decline, may need to involve palliative medicine.     LOS: 2 days   [place] [facility] Pager [phone] 09/12/2015, 10:23 AM  If 7PM-7AM, please contact night-coverage at [email or web address], password [facility]

## 2015-09-13 DIAGNOSIS — I10 Essential (primary) hypertension: Secondary | ICD-10-CM

## 2015-09-13 DIAGNOSIS — N189 Chronic kidney disease, unspecified: Secondary | ICD-10-CM

## 2015-09-13 LAB — BASIC METABOLIC PANEL
ANION GAP: 10 (ref 5–15)
BUN: 84 mg/dL — ABNORMAL HIGH (ref 6–20)
CALCIUM: 9 mg/dL (ref 8.9–10.3)
CO2: 26 mmol/L (ref 22–32)
Chloride: 105 mmol/L (ref 101–111)
Creatinine, Ser: 3.18 mg/dL — ABNORMAL HIGH (ref 0.44–1.00)
GFR, EST AFRICAN AMERICAN: 14 mL/min — AB (ref >60)
GFR, EST NON AFRICAN AMERICAN: 13 mL/min — AB (ref >60)
Glucose, Bld: 155 mg/dL — ABNORMAL HIGH (ref 65–99)
POTASSIUM: 4.7 mmol/L (ref 3.5–5.1)
Sodium: 141 mmol/L (ref 135–145)

## 2015-09-13 LAB — CBC
HCT: 29.6 % — ABNORMAL LOW (ref 36.0–46.0)
HEMOGLOBIN: 9.5 g/dL — AB (ref 12.0–15.0)
MCH: 30.2 pg (ref 26.0–34.0)
MCHC: 32.1 g/dL (ref 30.0–36.0)
MCV: 94 fL (ref 78.0–100.0)
Platelets: 207 10*3/uL (ref 150–400)
RBC: 3.15 MIL/uL — AB (ref 3.87–5.11)
RDW: 14.4 % (ref 11.5–15.5)
WBC: 13.3 10*3/uL — ABNORMAL HIGH (ref 4.0–10.5)

## 2015-09-13 LAB — GLUCOSE, CAPILLARY
GLUCOSE-CAPILLARY: 133 mg/dL — AB (ref 65–99)
GLUCOSE-CAPILLARY: 143 mg/dL — AB (ref 65–99)
Glucose-Capillary: 122 mg/dL — ABNORMAL HIGH (ref 65–99)
Glucose-Capillary: 133 mg/dL — ABNORMAL HIGH (ref 65–99)
Glucose-Capillary: 138 mg/dL — ABNORMAL HIGH (ref 65–99)
Glucose-Capillary: 142 mg/dL — ABNORMAL HIGH (ref 65–99)
Glucose-Capillary: 147 mg/dL — ABNORMAL HIGH (ref 65–99)

## 2015-09-13 MED ORDER — METOPROLOL TARTRATE 1 MG/ML IV SOLN
2.5000 mg | Freq: Three times a day (TID) | INTRAVENOUS | Status: DC
  Administered 2015-09-13 – 2015-09-18 (×13): 2.5 mg via INTRAVENOUS
  Filled 2015-09-13 (×13): qty 5

## 2015-09-13 MED ORDER — MORPHINE SULFATE (PF) 2 MG/ML IV SOLN
0.5000 mg | INTRAVENOUS | Status: AC | PRN
  Administered 2015-09-13 – 2015-09-14 (×3): 0.5 mg via INTRAVENOUS
  Filled 2015-09-13 (×3): qty 1

## 2015-09-13 NOTE — Care Management Important Message (Signed)
Important Message  Patient Details  Name: Linda [name] MRN: [id number] Date of Birth: [birth year]   Medicare Important Message Given:  Yes-second notification given    Nathen May 09/13/2015, 3:52 PM

## 2015-09-13 NOTE — Progress Notes (Signed)
[facility] PROGRESS NOTE  CHERYLE DARK WHQ:[id number] DOB: October 05, 1930 DOA: 09/09/2015  PCP: Blanchie Serve, MD  Brief HPI: 81 year old Caucasian female with a past medical history of dementia, seizure disorder, TIA, presented to the hospital with altered mental status. She lives in a skilled nursing facility. Patient is a DO NOT RESUSCITATE. Initially it was felt that her mental status changes could be due to narcotics. However, it appears to be secondary to infection and metabolic derangements. She was also noted to have acute renal failure.  Past medical history:  Past Medical History  Diagnosis Date  . Allergy     allergic rhinitis  . Diabetes mellitus     type II  . Hypertension   . Osteopenia   . TIA (transient ischemic attack)   . Contact dermatitis     on buttocks  . Nosebleed   . Anxiety   . Renal insufficiency   . Pyelonephritis   . Dementia   . Chiari malformation   . Seizure disorder ([place])   . ROTATOR CUFF TEAR 10/13/2007    Qualifier: Diagnosis of  By: Marcelino Scot CMA, [place]    . AKI (acute kidney injury) ([place]) 09/10/2015  . Cystitis 09/10/2015  . COPD, mild ([place])   . Arthritis     Consultants: None  Procedures: None  Antibiotics: Zosyn  Subjective: Patient remains poorly responsive. Her daughter is at bedside.  Objective: Vital Signs  Filed Vitals:   09/12/15 2056 09/12/15 2358 09/13/15 0453 09/13/15 1248  BP: 109/45 115/52 109/44   Pulse: 78 74 73   Temp: 98.1 F (36.7 C) 99.1 F (37.3 C) 98.6 F (37 C) 98.4 F (36.9 C)  TempSrc: Axillary Axillary Axillary Axillary  Resp: 14     Weight:   75.116 kg (165 lb 9.6 oz)   SpO2: 97% 98% 98%     Intake/Output Summary (Last 24 hours) at 09/13/15 1404 Last data filed at 09/13/15 0500  Gross per 24 hour  Intake   2146 ml  Output      0 ml  Net   2146 ml   Filed Weights   09/09/15 2234 09/13/15 0453  Weight: 70.171 kg (154 lb 11.2 oz) 75.116 kg (165 lb 9.6 oz)    General  appearance: Unresponsive Resp: Remains same. Course breath sounds bilaterally with rhonchi at the right base. Few crackles as well. Cardio: regular rate and rhythm, S1, S2 normal, no murmur, click, rub or gallop GI: soft, non-tender; bowel sounds normal; no masses,  no organomegaly Neurologic: Unresponsive.  Lab Results:  Basic Metabolic Panel:  Recent Labs Lab 09/10/15 0445 09/10/15 1114 09/11/15 0438 09/12/15 0415 09/13/15 0844  NA 150* 153* 143 146* 141  K 5.1 >7.5* 5.9* 5.3* 4.7  CL 109 115* 107 108 105  CO2 32 22 25 26 26   GLUCOSE 145* 106* 100* 134* 155*  BUN 40* 43* 47* 67* 84*  CREATININE 3.01* 3.36* 3.98* 4.22* 3.18*  CALCIUM 8.9 8.8* 8.0* 8.6* 9.0  MG 1.5*  --   --   --   --   PHOS  --   --   --  4.0  --    Liver Function Tests:  Recent Labs Lab 09/09/15 1935 09/10/15 0445 09/12/15 0415  AST 15 76*  --   ALT 13* 12*  --   ALKPHOS 41 40  --   BILITOT 0.4 0.2*  --   PROT 5.8* 5.8*  --   ALBUMIN 3.7 3.3* 2.6*   CBC:  Recent Labs Lab 09/09/15 1935 09/10/15 0445 09/12/15 0415 09/13/15 0844  WBC 12.8* 19.7* 16.8* 13.3*  NEUTROABS 10.4* 17.5* 15.4*  --   HGB 10.9* 10.4* 9.7* 9.5*  HCT 35.2* 33.1* 31.3* 29.6*  MCV 97.8 100.3* 97.5 94.0  PLT 207 222 195 207   CBG:  Recent Labs Lab 09/12/15 1942 09/12/15 2356 09/13/15 0410 09/13/15 0751 09/13/15 1159  GLUCAP 147* 138* 142* 133* 147*    Recent Results (from the past 240 hour(s))  Urine culture     Status: None   Collection Time: 09/09/15  6:55 PM  Result Value Ref Range Status   Specimen Description URINE, RANDOM  Final   Special Requests NONE  Final   Culture NO GROWTH 1 DAY  Final   Report Status 09/10/2015 FINAL  Final  Culture, blood (routine x 2)     Status: None (Preliminary result)   Collection Time: 09/10/15 12:04 AM  Result Value Ref Range Status   Specimen Description BLOOD LEFT HAND  Final   Special Requests IN PEDIATRIC BOTTLE 1CC  Final   Culture NO GROWTH 2 DAYS  Final    Report Status PENDING  Incomplete  Culture, blood (routine x 2)     Status: None (Preliminary result)   Collection Time: 09/10/15 12:10 AM  Result Value Ref Range Status   Specimen Description BLOOD RIGHT HAND  Final   Special Requests IN PEDIATRIC BOTTLE 1CC  Final   Culture NO GROWTH 2 DAYS  Final   Report Status PENDING  Incomplete      Studies/Results: US Renal Port  09/11/2015  CLINICAL DATA:  81 year old female with a history of diabetes and acute kidney injury EXAM: RENAL / URINARY TRACT ULTRASOUND COMPLETE COMPARISON:  CT 04/11/2014, MR 04/13/2014 FINDINGS: Right Kidney: Length: 10.7 cm. Echogenicity similar to that of the adjacent liver. No hydronephrosis. Flow confirmed in the hilum of the right kidney. Left Kidney: Length: 9.2 cm. Echogenicity relatively increase compared to the adjacent spleen. Cortical thinning with lobulated contour, similar to comparison cross-sectional imaging. Anechoic structure on the inferior aspect of the left kidney without internal flow measures 1.9 cm x 1.3 cm x 1.6 cm, characterized as cysts on prior MR. Bladder: Hyperechoic material layer dependently within the urinary bladder. IMPRESSION: No evidence of hydronephrosis. Increased echogenicity of left kidney suggesting medical renal disease. Hyperechoic material layered dependently within urinary bladder, potentially debris, complex fluid, or blood. Correlation with urinalysis may be useful. Signed, Dulcy Fanny. Earleen Newport, DO [facility] Radiology Electronically Signed   By: Corrie Mckusick D.O.   On: 09/11/2015 19:40    Medications:  Scheduled: . antiseptic oral rinse  7 mL Mouth Rinse BID  . enoxaparin (LOVENOX) injection  30 mg Subcutaneous Q24H  . insulin aspart  0-9 Units Subcutaneous 6 times per day  . levETIRAcetam  500 mg Intravenous Q12H  . metoprolol  2.5 mg Intravenous 3 times per day  . nystatin cream  1 application Topical Daily  .  piperacillin-tazobactam (ZOSYN)  IV  2.25 g Intravenous 3 times per day   Continuous: . dextrose 100 mL/hr at 09/13/15 0800   MWU:[id number] **OR** acetaminophen, albuterol, ondansetron (ZOFRAN) IV  Assessment/Plan:  Principal Problem:   UTI (lower urinary tract infection) Active Problems:   Essential hypertension   Anxiety   Acute on chronic renal failure (HCC)   Acute encephalopathy   DM (diabetes mellitus) type II controlled with renal manifestation (HCC)   Seizure disorder ([place])   Dementia with  behavioral disturbance   Acute cystitis with hematuria   Altered mental status    Toxic metabolic encephalopathy Likely due to infection and renal failure. Patient's mental status is unchanged compared to yesterday. Continue to monitor closely. Not a candidate for aggressive workup.  UTI versus pyelonephritis Continue antibiotics. Urine cultures did not show any growth so far.  Aspiration pneumonia X-ray shows worsening right lower lobe infiltrate. Patient is likely aspirating. This was communicated to the patient's daughter. Continue Zosyn for now. Prognosis is poor.  Acute kidney injury Baseline creatinine is around 1.7. Creatinine was getting worse with worsening BUN. She was noted to be significantly volume depleted. Rate of IV fluids was increased yesterday. Creatinine is slightly improved today. BUN is still high. Continue current management for now. Renal ultrasound report reviewed  Paroxysmal atrial fibrillation on 11/2 No further recurrence of tachycardia. Seems to be maintaining sinus rhythm with intravenous metoprolol. Heart rate noted to be in the 50s at times. We'll cut back on the dose. Onset of Atrial fibrillation likely due to acute illness. She is not a candidate for further workup or for anticoagulation due to her advanced dementia.  History of seizure disorder Continue intravenous Keppra  Hypotension in the setting of known essential hypertension Blood  pressure medications on hold. Give fluid boluses.  Type 2 diabetes Monitor blood sugars.  Hypernatremia Secondary to dehydration. Improved with hydration.   History of dementia Continue to monitor.   DVT Prophylaxis: Lovenox    Code Status: DO NOT RESUSCITATE  Family Communication: Discussed with the patient's daughter  Disposition Plan: Continue current management. Prognosis is poor. This was communicated to the daughter. Tinea current line of treatment for the next 24 hours. If there is no significant improvement in mental status we will involve palliative medicine.      LOS: 3 days   [facility] Pager [phone] 09/13/2015, 2:04 PM  If 7PM-7AM, please contact night-coverage at [email or web address], password [email or web address]

## 2015-09-13 NOTE — Clinical Social Work Note (Addendum)
CSW received phone call from patient's daughter who is requesting that patient could be faxed out to [facility] if patient's health improves.  CSW continuing to follow patient's progress throughout discharge planning.  [name]. [place], MSW, [place] 09/13/2015 6:03 PM

## 2015-09-14 DIAGNOSIS — I480 Paroxysmal atrial fibrillation: Secondary | ICD-10-CM | POA: Diagnosis not present

## 2015-09-14 DIAGNOSIS — Z515 Encounter for palliative care: Secondary | ICD-10-CM | POA: Insufficient documentation

## 2015-09-14 LAB — GLUCOSE, CAPILLARY
GLUCOSE-CAPILLARY: 141 mg/dL — AB (ref 65–99)
GLUCOSE-CAPILLARY: 139 mg/dL — AB (ref 65–99)
GLUCOSE-CAPILLARY: 159 mg/dL — AB (ref 65–99)
GLUCOSE-CAPILLARY: 145 mg/dL — AB (ref 65–99)
Glucose-Capillary: 152 mg/dL — ABNORMAL HIGH (ref 65–99)

## 2015-09-14 LAB — BASIC METABOLIC PANEL
Anion gap: 11 (ref 5–15)
BUN: 73 mg/dL — AB (ref 6–20)
CHLORIDE: 103 mmol/L (ref 101–111)
CO2: 26 mmol/L (ref 22–32)
Calcium: 9.1 mg/dL (ref 8.9–10.3)
Creatinine, Ser: 1.87 mg/dL — ABNORMAL HIGH (ref 0.44–1.00)
GFR, EST AFRICAN AMERICAN: 28 mL/min — AB (ref >60)
GFR, EST NON AFRICAN AMERICAN: 24 mL/min — AB (ref >60)
Glucose, Bld: 154 mg/dL — ABNORMAL HIGH (ref 65–99)
POTASSIUM: 4.2 mmol/L (ref 3.5–5.1)
SODIUM: 140 mmol/L (ref 135–145)

## 2015-09-14 LAB — CBC
HCT: 28.4 % — ABNORMAL LOW (ref 36.0–46.0)
HEMOGLOBIN: 9.7 g/dL — AB (ref 12.0–15.0)
MCH: 31.3 pg (ref 26.0–34.0)
MCHC: 34.2 g/dL (ref 30.0–36.0)
MCV: 91.6 fL (ref 78.0–100.0)
Platelets: 197 10*3/uL (ref 150–400)
RBC: 3.1 MIL/uL — AB (ref 3.87–5.11)
RDW: 14.4 % (ref 11.5–15.5)
WBC: 11.3 10*3/uL — AB (ref 4.0–10.5)

## 2015-09-14 MED ORDER — MORPHINE SULFATE (PF) 2 MG/ML IV SOLN
1.0000 mg | INTRAVENOUS | Status: DC | PRN
  Administered 2015-09-15 (×4): 1 mg via INTRAVENOUS
  Filled 2015-09-14 (×4): qty 1

## 2015-09-14 MED ORDER — PIPERACILLIN-TAZOBACTAM 3.375 G IVPB
3.3750 g | Freq: Three times a day (TID) | INTRAVENOUS | Status: DC
  Administered 2015-09-14 – 2015-09-18 (×12): 3.375 g via INTRAVENOUS
  Filled 2015-09-14 (×16): qty 50

## 2015-09-14 MED ORDER — MORPHINE SULFATE (PF) 2 MG/ML IV SOLN
1.0000 mg | INTRAVENOUS | Status: DC | PRN
  Administered 2015-09-14 (×3): 1 mg via INTRAVENOUS
  Filled 2015-09-14 (×3): qty 1

## 2015-09-14 NOTE — Care Management Note (Signed)
Case Management Note  Patient Details  Name: Linda [name] MRN: [id number] Date of Birth: 1930/06/09  Subjective/Objective:                    Action/Plan:  UR updated . Awaiting Palliative Consult  Expected Discharge Date:                  Expected Discharge Plan:     In-House Referral:  Clinical Social Work  Discharge planning Services  CM Consult  Post Acute Care Choice:    Choice offered to:     DME Arranged:    DME Agency:     HH Arranged:    [place] Agency:     Status of Service:  In process, will continue to follow  Medicare Important Message Given:  Yes-second notification given Date Medicare IM Given:    Medicare IM give by:    Date Additional Medicare IM Given:    Additional Medicare Important Message give by:     If discussed at [place] of Stay Meetings, dates discussed:    Additional Comments:  Marilu Favre, RN 09/14/2015, 10:14 AM

## 2015-09-14 NOTE — Clinical Social Work Note (Signed)
CSW to continue to follow patient's progress.  [name]. [place], MSW, [place] 09/14/2015 6:39 PM

## 2015-09-14 NOTE — Consult Note (Signed)
Consultation Note Date: 09/14/2015   Patient Name: Linda [name]  DOB: November 01, 1930  MRN: [id number]  Age / Sex: 81 y.o., female  PCP: Blanchie Serve, MD Referring Physician: Bonnielee Haff, MD  Reason for Consultation: Establishing goals of care, pain management, dyspnea mangement    Clinical Assessment/Narrative: Pt is a 81 yo female pt with moderate to severe dementia brought in for AMS. Found to have a UTI and started on IV antibiotics. Despite fluids, and antibiotics and reduction in any sedating medications she has remained lethargic, not able to eat. She is currently moaning, grimacing, and having increased work of breathing Daughter at bedside, [name], stated this has happened before but in the past once pain medications, as well as psychotropic medications were stopped in addition to IV abx she eventually cleared. She does state that this time feels different. She does open her eyes to voice, but also appears to be in pain when she does. She is not speaking with her daughter. She has been on percocet 10/325 TID PRN for 9 years. She does not take TID but does have severe chronic pain and uses daily. Discussed at length disease progression r/t dementia. Pt is currently on a declining trajectory in that she is having UTI about every other month. Per [name], family has not discussed issues such as recurrent hospitalizations, recurrent abx usage  Contacts/Participants in Discussion: Primary Decision Maker: Daughter [name] is HCPOA/POA.  Relationship to Patient Daughter HCPOA: yes  There are 2 other daughters and they do make decisions as a family  SUMMARY OF RECOMMENDATIONS Continue abx for now. Family is still hopeful for improvement Daughter [name] states they are not ready for comfort care yet but does acknowledge that she appears sicker to her than the last time I did share that I thought she met medicare  guidelines for in-home hospice at this point. [name] was somewhat surprised but open to discussion particularly if it improves the quality of care she receives in a SNF Family feels she is in pain and agreed for me to approach physician about increase in her pain meds Plan is to met other daughters tomorrow to reassess where the pt is clinically as well as further [place] discussion.  Code Status/Advance Care Planning: DNR    Code Status Orders        Start     Ordered   09/09/15 2119  Do not attempt resuscitation (DNR)   Continuous    Question Answer Comment  In the event of cardiac or respiratory ARREST Do not call a "code blue"   In the event of cardiac or respiratory ARREST Do not perform Intubation, CPR, defibrillation or ACLS   In the event of cardiac or respiratory ARREST Use medication by any route, position, wound care, and other measures to relive pain and suffering. May use oxygen, suction and manual treatment of airway obstruction as needed for comfort.      09/09/15 2121    Advance Directive Documentation        Most Recent Value   Type of Advance Directive  Healthcare Power of Attorney   Pre-existing out of facility DNR order (yellow form or pink MOST form)     "MOST" Form in Place?        Symptom Management:   Pain: Pt has moderate to severe chronic pain and would benefit from scheduled opioid but since we are still trying to establish how muh on her on-going lethargy is r/t infection, will increase prn ms04 45m  q3 prn  Agitation: Cont to hold Risperdal. Pt has not been agitated thus far  Palliative Prophylaxis:   Bowel Regimen  Additional Recommendations (Limitations, Scope, Preferences): Continue antibiotics and IVF Psycho-social/Spiritual:  Support System: Strong Desire for further Chaplaincy support:no Additional Recommendations: Education on Hospice  Prognosis: < 6 months  Discharge Planning: Still undetermined   Chief Complaint/ Primary  Diagnoses: Present on Admission:  . UTI (lower urinary tract infection) . Acute cystitis with hematuria . Acute encephalopathy . Acute on chronic renal failure ([place]) . Altered mental status . Anxiety . Dementia with behavioral disturbance . DM (diabetes mellitus) type II controlled with renal manifestation ([place]) . Essential hypertension  I have reviewed the medical record, interviewed the patient and family, and examined the patient. The following aspects are pertinent.  Past Medical History  Diagnosis Date  . Allergy     allergic rhinitis  . Diabetes mellitus     type II  . Hypertension   . Osteopenia   . TIA (transient ischemic attack)   . Contact dermatitis     on buttocks  . Nosebleed   . Anxiety   . Renal insufficiency   . Pyelonephritis   . Dementia   . Chiari malformation   . Seizure disorder ([place])   . ROTATOR CUFF TEAR 10/13/2007    Qualifier: Diagnosis of  By: Marcelino Scot CMA, [place]    . AKI (acute kidney injury) ([place]) 09/10/2015  . Cystitis 09/10/2015  . COPD, mild ([place])   . Arthritis    Social History   Social History  . Marital Status: Divorced    Spouse Name: N/A  . Number of Children: N/A  . Years of Education: N/A   Social History Main Topics  . Smoking status: Never Smoker   . Smokeless tobacco: Never Used  . Alcohol Use: No  . Drug Use: No  . Sexual Activity: No   Other Topics Concern  . None   Social History Narrative   Divorced.  Lives with her daughter, [name].  She has two other daughters.  Ambulates with a walker at baseline.   Family History  Problem Relation Age of Onset  . Stroke Mother   . Stroke Father    Scheduled Meds: . antiseptic oral rinse  7 mL Mouth Rinse BID  . enoxaparin (LOVENOX) injection  30 mg Subcutaneous Q24H  . insulin aspart  0-9 Units Subcutaneous 6 times per day  . levETIRAcetam  500 mg Intravenous Q12H  . metoprolol  2.5 mg Intravenous 3 times per day  . nystatin cream  1 application Topical Daily   . piperacillin-tazobactam (ZOSYN)  IV  2.25 g Intravenous 3 times per day   Continuous Infusions: . dextrose 100 mL/hr at 09/14/15 0751   PRN Meds:.acetaminophen **OR** acetaminophen, albuterol, morphine injection, ondansetron (ZOFRAN) IV Medications Prior to Admission:  Prior to Admission medications   Medication Sig Start Date End Date Taking? Authorizing Provider  acetaminophen (TYLENOL) 325 MG tablet Take 650 mg by mouth 3 (three) times daily. Do not exceed 4 grams of tylenol in 24 hours 05/23/15  Yes Lauree Chandler, NP  alendronate (FOSAMAX) 70 MG tablet Take 70 mg by mouth every 7 (seven) days. Saturday. Take with a full glass of water on an empty stomach.   Yes Historical Provider, MD  Calcium Carbonate-Vitamin D (CALCIUM-D) 600-400 MG-UNIT TABS Take 1 tablet by mouth 2 (two) times daily. For Osteoporosis   Yes Historical Provider, MD  Cranberry 250 MG TABS Take 1 tablet (  250 mg total) by mouth daily. Patient taking differently: Take 250 mg by mouth every evening. 5pm 08/25/14  Yes Gerlene Fee, NP  donepezil (ARICEPT) 10 MG tablet Take 20 mg by mouth at bedtime.    Yes Historical Provider, MD  escitalopram (LEXAPRO) 20 MG tablet Take 20 mg by mouth daily.   Yes Historical Provider, MD  feeding supplement, GLUCERNA SHAKE, (GLUCERNA SHAKE) LIQD Take 237 mLs by mouth 2 (two) times daily with a meal. 05/23/15  Yes Lauree Chandler, NP  ferrous sulfate 325 (65 FE) MG tablet Take 325 mg by mouth 2 (two) times daily with a meal.   Yes Historical Provider, MD  furosemide (LASIX) 20 MG tablet Take 10 mg by mouth daily. Hold for blood pressure <110/60   Yes Historical Provider, MD  levETIRAcetam (KEPPRA) 500 MG tablet Take 1 tablet (500 mg total) by mouth 2 (two) times daily. Patient taking differently: Take 500 mg by mouth 2 (two) times daily. For seizures 07/12/14  Yes Charlynne Cousins, MD  LORazepam (ATIVAN) 0.5 MG tablet Take 0.25 mg by mouth every 8 (eight) hours as needed for  anxiety.    Yes Historical Provider, MD  Melatonin 3 MG CAPS Take 3 mg by mouth at bedtime.    Yes Historical Provider, MD  memantine (NAMENDA XR) 14 MG CP24 24 hr capsule Take 14 mg by mouth daily.   Yes Historical Provider, MD  mirabegron ER (MYRBETRIQ) 25 MG TB24 tablet Take 25 mg by mouth daily.   Yes Historical Provider, MD  nystatin cream (MYCOSTATIN) Apply 1 application topically See admin instructions. Apply to abdominal (under) pannus and groin areas every shift until resolved, also apply to any skin fold as needed for moisture related rashes   Yes Historical Provider, MD  oxyCODONE-acetaminophen (PERCOCET) 10-325 MG per tablet Take one tablet by mouth every night at bedtime for pain. Take one tablet by mouth every 4 hours as needed for pain.  Do not exceed 4gm of Tylenol in 24hr Patient taking differently: Take 1 tablet by mouth See admin instructions. Take one tablet by mouth every night at bedtime for pain, may also take one tablet by mouth every 4 hours as needed for pain.  Do not exceed 4gm of Tylenol in 24hr 06/12/15  Yes Estill Dooms, MD  OXYGEN Inhale 3 L into the lungs continuous.   Yes Historical Provider, MD  polyethylene glycol (MIRALAX / GLYCOLAX) packet Take 17 g by mouth 2 (two) times daily. Mix in 4-8 oz liquid and drink   Yes Historical Provider, MD  risperiDONE (RISPERDAL) 1 MG/ML oral solution Take 0.5-1 mg by mouth See admin instructions. Take 1/2 ml (0.5 mg) by mouth daily at 8am, take 1 ml (1 mg) daily at 6pm - for psychosis   Yes Historical Provider, MD  senna-docusate (SENNA-S) 8.6-50 MG tablet Take 1 tablet by mouth 2 (two) times daily.   Yes Historical Provider, MD  tizanidine (ZANAFLEX) 2 MG capsule Take 2 mg by mouth 2 (two) times daily as needed for muscle spasms. Do not administer if patient is sedated.   Yes Historical Provider, MD  traZODone (DESYREL) 50 MG tablet Take 50 mg by mouth See admin instructions. Take 1 tablet (50 mg) by mouth daily at bedtime, may take  a 2nd dose as needed if not asleep by midnight   Yes Historical Provider, MD  zinc oxide (BALMEX) 11.3 % CREA cream Apply 1 application topically 2 (two) times daily. Apply to the left  buttock for preventative care every shift   Yes Historical Provider, MD  alum & mag hydroxide-simeth (MAALOX/MYLANTA) 200-200-20 MG/5ML suspension Take 30 mLs by mouth every 6 (six) hours as needed for indigestion or heartburn (dyspepsia). Patient not taking: Reported on 09/09/2015 08/24/14   Robbie Lis, MD   Allergies  Allergen Reactions  . Amoxicillin Other (See Comments)    aching all over, has tolerated Zosyn - listed as allergy on [facility] 09/09/15  . Atorvastatin Other (See Comments)    REACTION: increased CPK - not listed on [facility] 09/09/15  . Cephalexin Nausea Only and Other (See Comments)    Weak, Headache, has tolerated ceftriaxone - listed as allergy on [facility] 09/09/15  . Cetirizine Hcl Other (See Comments)    REACTION: makes her head feel big - not listed on [facility] 09/09/15  . Cortisone Other (See Comments)    REACTION: increased bp - not listed on [facility] 09/09/15  . Ezetimibe-Simvastatin Other (See Comments)    Unknown - not listed on [facility] 09/09/15  . Fentanyl Other (See Comments)    REACTION: stinging in her skin - not listed on [facility] 09/09/15  . Guaifenesin Other (See Comments)    Unknown - not listed on [facility] 09/09/15  . Ketorolac Tromethamine Other (See Comments)    REACTION: not work - not listed on [facility] 09/09/15  . Lisinopril Cough    ACE related - listed as allergy on [facility] 09/09/15  . Metformin Other (See Comments)    Epistaxis - not listed on [facility] 09/09/15  . Nsaids Other (See Comments)    Epistaxis - listed as allergy on [facility] 09/09/15  . Penicillins Nausea Only and Other (See Comments)    Headache, has tolerated Zosyn - listed as allergy on [facility] 09/09/15  . Rofecoxib Itching    Not listed on [facility] 09/09/15  . Rosuvastatin Other (See Comments)    Myalgias - not listed on [facility] 09/09/15  . Simvastatin  Other (See Comments)    Unknown - not listed on [facility] 09/09/15  . Spironolactone Nausea Only    Listed as allergy on [facility] 09/09/15  . Sulfamethoxazole-Trimethoprim Other (See Comments)    Unknown - not listed on [facility] 09/09/15  . Codeine Rash    Not listed on [facility] 09/09/15  . Tape Rash    Not listed on [facility] 09/09/15   CBC:    Component Value Date/Time   WBC 11.3* 09/14/2015 0535   WBC 7.8 06/13/2015 0754   HGB 9.7* 09/14/2015 0535   HCT 28.4* 09/14/2015 0535   PLT 197 09/14/2015 0535   MCV 91.6 09/14/2015 0535   NEUTROABS 15.4* 09/12/2015 0415   LYMPHSABS 0.8 09/12/2015 0415   MONOABS 0.5 09/12/2015 0415   EOSABS 0.0 09/12/2015 0415   BASOSABS 0.0 09/12/2015 0415   Comprehensive Metabolic Panel:    Component Value Date/Time   NA 140 09/14/2015 0535   NA 143 06/13/2015 0754   NA 148* 09/02/2014 0830   K 4.2 09/14/2015 0535   K 4.1 09/02/2014 0830   CL 103 09/14/2015 0535   CL 109* 09/02/2014 0830   CO2 26 09/14/2015 0535   CO2 37* 09/02/2014 0830   BUN 73* 09/14/2015 0535   BUN 46* 06/13/2015 0754   BUN 60* 09/02/2014 0830   CREATININE 1.87* 09/14/2015 0535   CREATININE 1.7* 06/13/2015 0754   CREATININE 1.21 09/02/2014 0830   GLUCOSE 154* 09/14/2015 0535   GLUCOSE 141* 09/02/2014 0830   CALCIUM 9.1 09/14/2015 0535   CALCIUM 8.6  09/02/2014 0830   AST 76* 09/10/2015 0445   ALT 12* 09/10/2015 0445   ALKPHOS 40 09/10/2015 0445   BILITOT 0.2* 09/10/2015 0445   PROT 5.8* 09/10/2015 0445   ALBUMIN 2.6* 09/12/2015 0415    Review of Systems  Unable to perform ROS   Physical Exam  Constitutional: She appears well-developed.  HENT:  Head: Normocephalic.  Cardiovascular:  Tachy, irrg  Respiratory: She is in respiratory distress.  Genitourinary:  foley  Neurological:  lethargic  Skin: Skin is warm and dry.    Vital Signs: BP 143/53 mmHg  Pulse 72  Temp(Src) 100.4 F (38 C) (Axillary)  Resp 19  Wt 75.116 kg (165 lb 9.6 oz)  SpO2 94% SpO2: Last BM Date:  09/12/15  O2 Device:SpO2: 94 % O2 Flow Rate: .O2 Flow Rate (L/min): 12 L/min Intake/output summary:  Intake/Output Summary (Last 24 hours) at 09/14/15 1428 Last data filed at 09/14/15 0839  Gross per 24 hour  Intake      0 ml  Output   1010 ml  Net  -1010 ml   LBM:  BMP Latest Ref Rng 09/14/2015 09/13/2015 09/12/2015  Glucose 65 - 99 mg/dL 154(H) 155(H) 134(H)  BUN 6 - 20 mg/dL 73(H) 84(H) 67(H)  Creatinine 0.44 - 1.00 mg/dL 1.87(H) 3.18(H) 4.22(H)  Sodium 135 - 145 mmol/L 140 141 146(H)  Potassium 3.5 - 5.1 mmol/L 4.2 4.7 5.3(H)  Chloride 101 - 111 mmol/L 103 105 108  CO2 22 - 32 mmol/L _0 Calcium 8.9 - 10.3 mg/dL 9.1 9.0 8.6(L)    Baseline Weight: Weight: 70.171 kg (154 lb 11.2 oz) Most recent weight: Weight: 75.116 kg (165 lb 9.6 oz)      Palliative Assessment/Data:  Flowsheet Rows        Most Recent Value   Intake Tab    Referral Department  Hospitalist   Unit at Time of Referral  Cardiac/Telemetry Unit   Palliative Care Primary Diagnosis  Sepsis/Infectious Disease   Date Notified  09/14/15   Palliative Care Type  New Palliative care   Reason for referral  Clarify Goals of Care, Counsel Regarding Hospice   Date of Admission  09/09/15   Date first seen by Palliative Care  09/14/15   # of days Palliative referral response time  0 Day(s)   # of days IP prior to Palliative referral  5   Clinical Assessment    Palliative Performance Scale Score  20%   Pain Max last 24 hours  Not able to report   Pain Min Last 24 hours  Not able to report   Dyspnea Max Last 24 Hours  Not able to report   Dyspnea Min Last 24 hours  Not able to report   Nausea Max Last 24 Hours  Not able to report   Nausea Min Last 24 Hours  Not able to report   Anxiety Max Last 24 Hours  Not able to report   Anxiety Min Last 24 Hours  Not able to report   Psychosocial & Spiritual Assessment    Palliative Care Outcomes    Patient/Family meeting held?  Yes   Palliative Care Outcomes  Improved pain  interventions   Palliative Care follow-up planned  Yes, Facility      Additional Data Reviewed: Recent Labs     09/13/15  0844  09/14/15  0535  WBC  13.3*  11.3*  HGB  9.5*  9.7*  PLT  207  197  NA  141  140  BUN  84*  73*  CREATININE  3.18*  1.87*    Time In: 1030 Time Out: 1145 Time Total: 80 min Greater than 50%  of this time was spent counseling and coordinating care related to the above assessment and plan. Spoke to Dr. [place] Pink  Signed by: Dory Horn, NP  Dory Horn, NP  09/14/2015, 2:28 PM  Please contact Palliative Medicine Team phone at [phone] for questions and concerns.

## 2015-09-14 NOTE — Progress Notes (Signed)
ANTIBIOTIC CONSULT NOTE - FOLLOW UP  Pharmacy Consult for Zosyn Indication: pneumonia  Allergies  Allergen Reactions  . Amoxicillin Other (See Comments)    aching all over, has tolerated Zosyn - listed as allergy on [facility] 09/09/15  . Atorvastatin Other (See Comments)    REACTION: increased CPK - not listed on [facility] 09/09/15  . Cephalexin Nausea Only and Other (See Comments)    Weak, Headache, has tolerated ceftriaxone - listed as allergy on [facility] 09/09/15  . Cetirizine Hcl Other (See Comments)    REACTION: makes her head feel big - not listed on [facility] 09/09/15  . Cortisone Other (See Comments)    REACTION: increased bp - not listed on [facility] 09/09/15  . Ezetimibe-Simvastatin Other (See Comments)    Unknown - not listed on [facility] 09/09/15  . Fentanyl Other (See Comments)    REACTION: stinging in her skin - not listed on [facility] 09/09/15  . Guaifenesin Other (See Comments)    Unknown - not listed on [facility] 09/09/15  . Ketorolac Tromethamine Other (See Comments)    REACTION: not work - not listed on [facility] 09/09/15  . Lisinopril Cough    ACE related - listed as allergy on [facility] 09/09/15  . Metformin Other (See Comments)    Epistaxis - not listed on [facility] 09/09/15  . Nsaids Other (See Comments)    Epistaxis - listed as allergy on [facility] 09/09/15  . Penicillins Nausea Only and Other (See Comments)    Headache, has tolerated Zosyn - listed as allergy on [facility] 09/09/15  . Rofecoxib Itching    Not listed on [facility] 09/09/15  . Rosuvastatin Other (See Comments)    Myalgias - not listed on [facility] 09/09/15  . Simvastatin Other (See Comments)    Unknown - not listed on [facility] 09/09/15  . Spironolactone Nausea Only    Listed as allergy on [facility] 09/09/15  . Sulfamethoxazole-Trimethoprim Other (See Comments)    Unknown - not listed on [facility] 09/09/15  . Codeine Rash    Not listed on [facility] 09/09/15  . Tape Rash    Not listed on [facility] 09/09/15    Patient Measurements: Weight: 165 lb 9.6 oz (75.116 kg) Adjusted Body Weight:   Vital  Signs: Temp: 98.6 F (37 C) (11/04 1435) Temp Source: Axillary (11/04 1435) BP: 160/66 mmHg (11/04 1435) Pulse Rate: 85 (11/04 1435) Intake/Output from previous day:   Intake/Output from this shift: Total I/O In: -  Out: 1010 [Urine:1010]  Labs:  Recent Labs  09/12/15 0415 09/13/15 0844 09/14/15 0535  WBC 16.8* 13.3* 11.3*  HGB 9.7* 9.5* 9.7*  PLT 195 207 197  CREATININE 4.22* 3.18* 1.87*   Estimated Creatinine Clearance: 23.1 mL/min (by C-G formula based on Cr of 1.87). No results for input(s): VANCOTROUGH, VANCOPEAK, VANCORANDOM, GENTTROUGH, GENTPEAK, GENTRANDOM, TOBRATROUGH, TOBRAPEAK, TOBRARND, AMIKACINPEAK, AMIKACINTROU, AMIKACIN in the last 72 hours.   Microbiology: Recent Results (from the past 720 hour(s))  Urine culture     Status: None   Collection Time: 09/09/15  6:55 PM  Result Value Ref Range Status   Specimen Description URINE, RANDOM  Final   Special Requests NONE  Final   Culture NO GROWTH 1 DAY  Final   Report Status 09/10/2015 FINAL  Final  Culture, blood (routine x 2)     Status: None (Preliminary result)   Collection Time: 09/10/15 12:04 AM  Result Value Ref Range Status   Specimen Description BLOOD LEFT HAND  Final   Special Requests IN PEDIATRIC BOTTLE [place]  Final   Culture NO GROWTH 3 DAYS  Final   Report Status PENDING  Incomplete  Culture, blood (routine x 2)     Status: None (Preliminary result)   Collection Time: 09/10/15 12:10 AM  Result Value Ref Range Status   Specimen Description BLOOD RIGHT HAND  Final   Special Requests IN PEDIATRIC BOTTLE 1CC  Final   Culture NO GROWTH 3 DAYS  Final   Report Status PENDING  Incomplete    Anti-infectives    Start     Dose/Rate Route Frequency Ordered Stop   09/11/15 1230  piperacillin-tazobactam (ZOSYN) IVPB 2.25 g     2.25 g 100 mL/hr over 30 Minutes Intravenous 3 times per day 09/11/15 1211     09/10/15 2200  cefTRIAXone (ROCEPHIN) 1 g in dextrose 5 % 50 mL IVPB  Status:  Discontinued     1  g 100 mL/hr over 30 Minutes Intravenous Every 24 hours 09/09/15 2303 09/11/15 1203   09/10/15 1930  azithromycin (ZITHROMAX) 500 mg in dextrose 5 % 250 mL IVPB  Status:  Discontinued     500 mg 250 mL/hr over 60 Minutes Intravenous Every 24 hours 09/10/15 1901 09/11/15 1203   09/09/15 2100  cefTRIAXone (ROCEPHIN) 1 g in dextrose 5 % 50 mL IVPB     1 g 100 mL/hr over 30 Minutes Intravenous  Once 09/09/15 2054 09/09/15 2236      Assessment: 81yo female with aspiration pneumonia, on renally adjusted Zosyn due to AKI.  Cr with significant improvement today to 1.87.  Blood cx remain negative to date.  Goal of Therapy:  treatment of infection  Plan:  Change Zosyn to 3.375g IV q8, infuse over 4hr F/U Cr & readjust if needed F/U cx  Gracy Bruins, PharmD [facility]

## 2015-09-14 NOTE — Progress Notes (Signed)
[facility] PROGRESS NOTE  Linda [name] EQA:[id number] DOB: 1930-07-18 DOA: 09/09/2015  PCP: Blanchie Serve, MD  Brief HPI: 81 year old Caucasian female with a past medical history of dementia, seizure disorder, TIA, presented to the hospital with altered mental status. She lives in a skilled nursing facility. Patient is a DO NOT RESUSCITATE. Initially it was felt that her mental status changes could be due to narcotics. However, it appears to be secondary to infection and metabolic derangements. She was also noted to have acute renal failure. Her renal function improved with IV fluids. However, her mental status remained the same. She remained minimally responsive. Palliative medicine was consulted.  Past medical history:  Past Medical History  Diagnosis Date  . Allergy     allergic rhinitis  . Diabetes mellitus     type II  . Hypertension   . Osteopenia   . TIA (transient ischemic attack)   . Contact dermatitis     on buttocks  . Nosebleed   . Anxiety   . Renal insufficiency   . Pyelonephritis   . Dementia   . Chiari malformation   . Seizure disorder ([place])   . ROTATOR CUFF TEAR 10/13/2007    Qualifier: Diagnosis of  By: Marcelino Scot CMA, [place]    . AKI (acute kidney injury) ([place]) 09/10/2015  . Cystitis 09/10/2015  . COPD, mild ([place])   . Arthritis     Consultants: Palliative medicine  Procedures: None  Antibiotics: Zosyn  Subjective: Patient perhaps slightly more responsive to painful stimuli but not much change compared to yesterday. Her daughter is at bedside.  Objective: Vital Signs  Filed Vitals:   09/13/15 1248 09/13/15 1500 09/13/15 2002 09/14/15 0423  BP:  128/54 125/48 143/53  Pulse:  76 62 72  Temp: 98.4 F (36.9 C) 97.5 F (36.4 C) 98.5 F (36.9 C) 100.4 F (38 C)  TempSrc: Axillary Oral Axillary Axillary  Resp:  14 18 19   Weight:      SpO2:  96% 93% 94%   No intake or output data in the 24 hours ending 09/14/15 0829 Filed Weights    09/09/15 2234 09/13/15 0453  Weight: 70.171 kg (154 lb 11.2 oz) 75.116 kg (165 lb 9.6 oz)    General appearance: Minimally responsive Resp: Course breath sounds bilaterally with rhonchi at the right base. Few crackles as well. Cardio: regular rate and rhythm, S1, S2 normal, no murmur, click, rub or gallop GI: soft, non-tender; bowel sounds normal; no masses,  no organomegaly Neurologic: Responsive to painful stimuli only.  Lab Results:  Basic Metabolic Panel:  Recent Labs Lab 09/10/15 0445 09/10/15 1114 09/11/15 0438 09/12/15 0415 09/13/15 0844 09/14/15 0535  NA 150* 153* 143 146* 141 140  K 5.1 >7.5* 5.9* 5.3* 4.7 4.2  CL 109 115* 107 108 105 103  CO2 32 22 25 26 26 26   GLUCOSE 145* 106* 100* 134* 155* 154*  BUN 40* 43* 47* 67* 84* 73*  CREATININE 3.01* 3.36* 3.98* 4.22* 3.18* 1.87*  CALCIUM 8.9 8.8* 8.0* 8.6* 9.0 9.1  MG 1.5*  --   --   --   --   --   PHOS  --   --   --  4.0  --   --    Liver Function Tests:  Recent Labs Lab 09/09/15 1935 09/10/15 0445 09/12/15 0415  AST 15 76*  --   ALT 13* 12*  --   ALKPHOS 41 40  --   BILITOT 0.4 0.2*  --  PROT 5.8* 5.8*  --   ALBUMIN 3.7 3.3* 2.6*   CBC:  Recent Labs Lab 09/09/15 1935 09/10/15 0445 09/12/15 0415 09/13/15 0844 09/14/15 0535  WBC 12.8* 19.7* 16.8* 13.3* 11.3*  NEUTROABS 10.4* 17.5* 15.4*  --   --   HGB 10.9* 10.4* 9.7* 9.5* 9.7*  HCT 35.2* 33.1* 31.3* 29.6* 28.4*  MCV 97.8 100.3* 97.5 94.0 91.6  PLT 207 222 195 207 197   CBG:  Recent Labs Lab 09/13/15 1624 09/13/15 2001 09/13/15 2347 09/14/15 0421 09/14/15 0756  GLUCAP 143* 133* 122* 141* 139*    Recent Results (from the past 240 hour(s))  Urine culture     Status: None   Collection Time: 09/09/15  6:55 PM  Result Value Ref Range Status   Specimen Description URINE, RANDOM  Final   Special Requests NONE  Final   Culture NO GROWTH 1 DAY  Final   Report Status 09/10/2015 FINAL  Final  Culture, blood (routine x 2)     Status:  None (Preliminary result)   Collection Time: 09/10/15 12:04 AM  Result Value Ref Range Status   Specimen Description BLOOD LEFT HAND  Final   Special Requests IN PEDIATRIC BOTTLE 1CC  Final   Culture NO GROWTH 3 DAYS  Final   Report Status PENDING  Incomplete  Culture, blood (routine x 2)     Status: None (Preliminary result)   Collection Time: 09/10/15 12:10 AM  Result Value Ref Range Status   Specimen Description BLOOD RIGHT HAND  Final   Special Requests IN PEDIATRIC BOTTLE 1CC  Final   Culture NO GROWTH 3 DAYS  Final   Report Status PENDING  Incomplete      Studies/Results: No results found.  Medications:  Scheduled: . antiseptic oral rinse  7 mL Mouth Rinse BID  . enoxaparin (LOVENOX) injection  30 mg Subcutaneous Q24H  . insulin aspart  0-9 Units Subcutaneous 6 times per day  . levETIRAcetam  500 mg Intravenous Q12H  . metoprolol  2.5 mg Intravenous 3 times per day  . nystatin cream  1 application Topical Daily  . piperacillin-tazobactam (ZOSYN)  IV  2.25 g Intravenous 3 times per day   Continuous: . dextrose 100 mL/hr at 09/14/15 0751   KVQ:[id number] **OR** acetaminophen, albuterol, morphine injection, ondansetron (ZOFRAN) IV  Assessment/Plan:  Principal Problem:   UTI (lower urinary tract infection) Active Problems:   Essential hypertension   Anxiety   Acute on chronic renal failure (HCC)   Acute encephalopathy   DM (diabetes mellitus) type II controlled with renal manifestation (HCC)   Seizure disorder (HCC)   Dementia with behavioral disturbance   Acute cystitis with hematuria   Altered mental status    Toxic metabolic encephalopathy Likely due to infection and renal failure. Patient's mental status remains unchanged for the most part. Continue to monitor closely. Not a candidate for aggressive workup.  UTI versus pyelonephritis Continue antibiotics. Urine cultures did not show any growth so far.  Aspiration pneumonia X-ray shows worsening  right lower lobe infiltrate. Patient is likely aspirating. This was communicated to the patient's daughter. Continue Zosyn for now. Prognosis remains poor.  Acute kidney injury Baseline creatinine is around 1.7. Creatinine was getting worse with worsening BUN. She was noted to be significantly volume depleted. Rate of IV fluids was increased and now creatinine is improving. BUN is also getting better. However, her mental status remains unchanged. Renal ultrasound report reviewed  Paroxysmal atrial fibrillation on 11/2 No further recurrence of tachycardia.  Seems to be maintaining sinus rhythm with intravenous metoprolol. Onset of Atrial fibrillation likely due to acute illness. She is not a candidate for further workup or for anticoagulation due to her advanced dementia.  History of seizure disorder Continue intravenous Keppra  Hypotension in the setting of known essential hypertension Blood pressure medications on hold. Give fluid boluses.  Type 2 diabetes Monitor blood sugars.  Hypernatremia Secondary to dehydration. Improved with hydration.   History of dementia Continue to monitor.  Overall, patient has not made much clinical improvement despite improvement in her numbers as discussed above. Her prognosis remains poor. This was communicated to the daughter. At this time I think it is reasonable to pursue a palliative approach. We will consult palliative medicine. If there is no significant improvement in the next 48 hours, patient may be a candidate for comfort care/hospice. Daughter agrees to discuss with palliative medicine.  DVT Prophylaxis: Lovenox    Code Status: DO NOT RESUSCITATE  Family Communication: Discussed with the patient's daughter  Disposition Plan: Continue current management. Prognosis remains poor.      LOS: 4 days   [facility] Pager [phone] 09/14/2015, 8:29 AM  If 7PM-7AM, please contact night-coverage at [email or web address], password  [facility]

## 2015-09-14 NOTE — Progress Notes (Signed)
RT called to bedside due to patient's sats in the low 80's. On arrival, found patient on NRB and sats improved to 94%. Patient's daughter is at bedside and said almost every night patient's sats dropped between 75-81%. After suctioning her mouth her oxygenation improved. RT will continue to monitor.

## 2015-09-14 NOTE — Progress Notes (Signed)
Patient had a temp of 101.4. Family did not want to give tylenol at this time.  Family said that it hurts the patient too much to turn..  Continue to monitor.

## 2015-09-15 LAB — GLUCOSE, CAPILLARY
GLUCOSE-CAPILLARY: 135 mg/dL — AB (ref 65–99)
GLUCOSE-CAPILLARY: 141 mg/dL — AB (ref 65–99)
Glucose-Capillary: 156 mg/dL — ABNORMAL HIGH (ref 65–99)
Glucose-Capillary: 129 mg/dL — ABNORMAL HIGH (ref 65–99)
Glucose-Capillary: 100 mg/dL — ABNORMAL HIGH (ref 65–99)
Glucose-Capillary: 118 mg/dL — ABNORMAL HIGH (ref 65–99)

## 2015-09-15 LAB — BASIC METABOLIC PANEL
ANION GAP: 10 (ref 5–15)
BUN: 55 mg/dL — ABNORMAL HIGH (ref 6–20)
CO2: 26 mmol/L (ref 22–32)
Calcium: 8.6 mg/dL — ABNORMAL LOW (ref 8.9–10.3)
Chloride: 102 mmol/L (ref 101–111)
Creatinine, Ser: 1.34 mg/dL — ABNORMAL HIGH (ref 0.44–1.00)
GFR calc Af Amer: 41 mL/min — ABNORMAL LOW (ref >60)
GFR, EST NON AFRICAN AMERICAN: 36 mL/min — AB (ref >60)
Glucose, Bld: 159 mg/dL — ABNORMAL HIGH (ref 65–99)
POTASSIUM: 4 mmol/L (ref 3.5–5.1)
SODIUM: 138 mmol/L (ref 135–145)

## 2015-09-15 LAB — CBC
HCT: 27.9 % — ABNORMAL LOW (ref 36.0–46.0)
Hemoglobin: 9.7 g/dL — ABNORMAL LOW (ref 12.0–15.0)
MCH: 31.8 pg (ref 26.0–34.0)
MCHC: 34.8 g/dL (ref 30.0–36.0)
MCV: 91.5 fL (ref 78.0–100.0)
PLATELETS: 176 10*3/uL (ref 150–400)
RBC: 3.05 MIL/uL — AB (ref 3.87–5.11)
RDW: 14.5 % (ref 11.5–15.5)
WBC: 10.4 10*3/uL (ref 4.0–10.5)

## 2015-09-15 LAB — CULTURE, BLOOD (ROUTINE X 2)
CULTURE: NO GROWTH
Culture: NO GROWTH

## 2015-09-15 MED ORDER — LORAZEPAM 2 MG/ML IJ SOLN
0.5000 mg | Freq: Once | INTRAMUSCULAR | Status: AC
Start: 2015-09-15 — End: 2015-09-15
  Administered 2015-09-15: 0.5 mg via INTRAVENOUS
  Filled 2015-09-15: qty 1

## 2015-09-15 MED ORDER — MORPHINE SULFATE (PF) 2 MG/ML IV SOLN
1.0000 mg | INTRAVENOUS | Status: DC | PRN
  Administered 2015-09-15 – 2015-09-16 (×2): 2 mg via INTRAVENOUS
  Administered 2015-09-16: 1 mg via INTRAVENOUS
  Administered 2015-09-16 – 2015-09-17 (×5): 2 mg via INTRAVENOUS
  Administered 2015-09-17: 1 mg via INTRAVENOUS
  Administered 2015-09-18 (×4): 2 mg via INTRAVENOUS
  Filled 2015-09-15 (×16): qty 1

## 2015-09-15 MED ORDER — MORPHINE SULFATE (PF) 2 MG/ML IV SOLN
1.0000 mg | INTRAVENOUS | Status: DC
  Administered 2015-09-15 – 2015-09-18 (×5): 1 mg via INTRAVENOUS
  Filled 2015-09-15 (×2): qty 1

## 2015-09-15 MED ORDER — MORPHINE SULFATE (PF) 2 MG/ML IV SOLN
2.0000 mg | INTRAVENOUS | Status: AC
Start: 2015-09-15 — End: 2015-09-15
  Administered 2015-09-15: 2 mg via INTRAVENOUS
  Filled 2015-09-15: qty 1

## 2015-09-15 MED ORDER — ACETAMINOPHEN 10 MG/ML IV SOLN
1000.0000 mg | Freq: Once | INTRAVENOUS | Status: AC
  Administered 2015-09-15: 1000 mg via INTRAVENOUS
  Filled 2015-09-15: qty 100

## 2015-09-15 MED ORDER — ACETAMINOPHEN 10 MG/ML IV SOLN
1000.0000 mg | Freq: Four times a day (QID) | INTRAVENOUS | Status: AC
  Administered 2015-09-15 – 2015-09-16 (×2): 1000 mg via INTRAVENOUS
  Filled 2015-09-15 (×2): qty 100

## 2015-09-15 MED ORDER — LORAZEPAM 2 MG/ML IJ SOLN
0.2500 mg | INTRAMUSCULAR | Status: DC | PRN
  Administered 2015-09-15 – 2015-09-18 (×11): 0.5 mg via INTRAVENOUS
  Filled 2015-09-15 (×12): qty 1

## 2015-09-15 NOTE — Progress Notes (Signed)
Pt laying in bed with eyes closed. Opens eyes briefly to voice prompts. general condition unstable. Daughter at bedside

## 2015-09-15 NOTE — Progress Notes (Signed)
Daily Progress Note   Patient Name: Linda [name]       Date: 09/15/2015 DOB: 1930-05-17  Age: 81 y.o. MRN#: [id number] Attending Physician: Bonnielee Haff, MD Primary Care Physician: Blanchie Serve, MD Admit Date: 09/09/2015  Reason for Consultation/Follow-up: Establishing goals of care, Non pain symptom management and Pain control  Subjective: Pt 81 yo female with recurrent UTI, moderate to severe dementia with behavioral disturbances, adm with AMS. Pt not returning to baseline in terms of increased LOC, being able to eat, or reduction in 02 demand. She is in a great deal of pain this am. Chart reviewed and notice multiple prn ms04 approx q2 prn overnight. She is crying. Pt has been unable to take her Risperdal and this in addition to unmanaged pain and declining clinical condition could all be factoring into her presentation. Daughter's [name] and Sunday Spillers at bedside this am. Sunday Spillers shares she is ready for her mother to pass on. Feels she would not want to live this way. Reports her sisters are struggling with letting her go. [name] sees that her mother is not improving as she has in the past. 1600: Much improved after ativan and scheduled ms04. Daughter [name] unable to attend meeting as she is sick. Met with niece Audelia Acton, as well as daughters Sunday Spillers, and [name]. Discussed at length disease trajectory 2/2 dementia as well as pt's current clinical status. Specifically concern that despite IVF and antibiotics she is minimally responsive except to pain Interval Events: Declining clinical condition Length of Stay: 5 days  Current Medications: Scheduled Meds:  . antiseptic oral rinse  7 mL Mouth Rinse BID  . enoxaparin (LOVENOX) injection  30 mg Subcutaneous Q24H  . insulin aspart  0-9 Units  Subcutaneous 6 times per day  . levETIRAcetam  500 mg Intravenous Q12H  . metoprolol  2.5 mg Intravenous 3 times per day  .  morphine injection  1 mg Intravenous 6 times per day  . nystatin cream  1 application Topical Daily  . piperacillin-tazobactam (ZOSYN)  IV  3.375 g Intravenous 3 times per day    Continuous Infusions: . dextrose 75 mL/hr at 09/15/15 1052    PRN Meds: acetaminophen **OR** acetaminophen, albuterol, LORazepam, morphine injection, ondansetron (ZOFRAN) IV  Physical Exam: Physical Exam  Constitutional: She appears well-developed.  HENT:  Head: Normocephalic.  Neck:  Flexed forward due to pain  Cardiovascular:  Tachy, irrg  Pulmonary/Chest: She is in respiratory distress.  Abdominal: Soft.  Musculoskeletal: She exhibits tenderness.  Neurological: She is alert.  Skin: Skin is warm.  Psychiatric:  Confused, anxious                Vital Signs: BP 126/73 mmHg  Pulse 71  Temp(Src) 98.6 F (37 C) (Axillary)  Resp 18  Wt 77.747 kg (171 lb 6.4 oz)  SpO2 90% SpO2: SpO2: 90 % O2 Device: O2 Device: NRB O2 Flow Rate: O2 Flow Rate (L/min): 14 L/min  Intake/output summary:  Intake/Output Summary (Last 24 hours) at 09/15/15 1054 Last data filed at 09/15/15 0440  Gross per 24 hour  Intake   5220 ml  Output   1350 ml  Net   3870 ml   LBM:   Baseline Weight: Weight: 70.171 kg (154 lb 11.2 oz) Most recent weight: Weight: 77.747 kg (171 lb 6.4 oz)       Palliative Assessment/Data: Flowsheet Rows        Most Recent Value   Intake Tab    Referral Department  Hospitalist   Unit at Time of Referral  Med/Surg Unit   Palliative Care Primary Diagnosis  Sepsis/Infectious Disease   Date Notified  09/14/15   Palliative Care Type  New Palliative care   Reason for referral  Clarify Goals of Care, Pain   Date of Admission  09/10/15   Date first seen by Palliative Care  09/14/15   # of days Palliative referral response time  0 Day(s)   # of days IP prior to  Palliative referral  4   Clinical Assessment    Palliative Performance Scale Score  20%   Pain Max last 24 hours  Not able to report   Pain Min Last 24 hours  Not able to report   Dyspnea Max Last 24 Hours  Not able to report   Dyspnea Min Last 24 hours  Not able to report   Nausea Max Last 24 Hours  Not able to report   Nausea Min Last 24 Hours  Not able to report   Anxiety Max Last 24 Hours  Not able to report   Anxiety Min Last 24 Hours  Not able to report   Other Max Last 24 Hours  Not able to report   Psychosocial & Spiritual Assessment    Palliative Care Outcomes    Patient/Family meeting held?  Yes   Who was at the meeting?  daughte Norberta Keens, and [place] Outcomes  Improved pain interventions, Clarified goals of care   Palliative Care follow-up planned  Yes, Facility      Additional Data Reviewed: Recent Labs     09/14/15  0535  09/15/15  0614  WBC  11.3*  10.4  HGB  9.7*  9.7*  PLT  197  176  NA  140  138  BUN  73*  55*  CREATININE  1.87*  1.34*     Problem List:  Patient Active Problem List   Diagnosis Date Noted  . PAF (paroxysmal atrial fibrillation) ([place]) 09/14/2015  . Palliative care encounter   . Acute cystitis with hematuria   . Altered mental status   . Dementia   . Pulmonary hypertension ([place])   . Diabetes mellitus type 2, insulin dependent ([place])   . Daytime somnolence 07/22/2015  . Depression due to dementia 06/07/2015  . CKD (chronic kidney  disease) 02/12/2015  . Osteoporosis 02/12/2015  . Anemia of chronic disease 02/12/2015  . Dementia with behavioral disturbance 02/12/2015  . Protein-calorie malnutrition ([place]) 02/12/2015  . COPD, mild ([place]) 09/28/2014  . Hypoxia 09/05/2014  . Depression with anxiety 08/30/2014  . UI (urinary incontinence) 08/25/2014  . Dementia without behavioral disturbance 08/25/2014  . Constipation 08/25/2014  . UTI (lower urinary tract infection) 08/21/2014  . DM (diabetes mellitus) type II  controlled with renal manifestation ([place]) 08/21/2014  . Seizure disorder ([place]) 08/21/2014  . Acute encephalopathy 04/10/2014  . Fall at home 06/11/2013  . Orthostatic hypotension 06/11/2013  . Acute on chronic renal failure ([place]) 06/11/2013  . Anemia 08/27/2012  . Benign hypertensive heart disease without heart failure 06/22/2012  . Anxiety 06/08/2011  . VENOUS INSUFFICIENCY, LEGS 06/28/2009  . EDEMA 06/20/2009  . Obesity 04/26/2008  . GOITER, MULTINODULAR 10/13/2007  . HYPERCHOLESTEROLEMIA 10/13/2007  . Essential hypertension 10/13/2007  . ALLERGIC RHINITIS 10/13/2007  . RENAL INSUFFICIENCY 10/13/2007  . DEGENERATIVE DISC DISEASE, CERVICAL SPINE 10/13/2007  . Disorder of bone and cartilage 10/13/2007  . TRANSIENT ISCHEMIC ATTACK, HX OF 10/13/2007  . DIABETES MELLITUS, TYPE II 03/25/2007     Palliative Care Assessment & Plan    1.Code Status:  DNR    Code Status Orders        Start     Ordered   09/09/15 2119  Do not attempt resuscitation (DNR)   Continuous    Question Answer Comment  In the event of cardiac or respiratory ARREST Do not call a "code blue"   In the event of cardiac or respiratory ARREST Do not perform Intubation, CPR, defibrillation or ACLS   In the event of cardiac or respiratory ARREST Use medication by any route, position, wound care, and other measures to relive pain and suffering. May use oxygen, suction and manual treatment of airway obstruction as needed for comfort.      09/09/15 2121    Advance Directive Documentation        Most Recent Value   Type of Advance Directive  Healthcare Power of Attorney   Pre-existing out of facility DNR order (yellow form or pink MOST form)     "MOST" Form in Place?         2. Goals of Care:  DNR/DNI  Continue antibiotics and fluids until 09/17/15 and still non improvement goal is to transfer to in-pt hospice for EOL care  Limitations on Scope of Treatment: No Artificial Feeding and No Blood  Transfusions  Desire for further Chaplaincy support:no  Psycho-social Needs: Grief/Bereavement Support  3. Symptom Management:      1.Pain: Will schedule ms04 1 mg q4 atc and increase breakthru to 1-2 q2 prn       2. Dyspnea: pt having increased work of breathing. Has NRB on plus 4L [place] underneath mask.               Cont 02 as tolerated. Primary mgt with opioids. See above       3. Anxiety: Likely multi-factorial: pain, not on Risperdal and could also be heralding EOL, terminal delirium. Will start   low dose ativan. Daughters in agreement. Monitor for need for scheduled haldol to address potentially developing delirium.   4. Palliative Prophylaxis:   Bowel Regimen, Delirium Protocol, Frequent Pain Assessment, Oral Care and Turn Reposition  5. Prognosis: Pt's prognosis looking more grim as she is unable to eat not not improving with IVF and antibiotics. She definitely meets hospice in  facility criteria but will monitor for change in status  6. Discharge Planning:  If no improvement in 48 hours of continued fluids and antibiotics, hopeful for in-pt hospice   Care plan was discussed with family and RN  Thank you for allowing the Palliative Medicine Team to assist in the care of this patient. This visist was held in 2 components to mange pain/symptoms this am as well as meeting other daughter [name] at [place]   Time In: 1000 1600 Time Out: 1030 1730 Total Time 120 min Prolonged Time Billed  yes         Dory Horn, NP  09/15/2015, 10:54 AM  Please contact Palliative Medicine Team phone at [phone] for questions and concerns.

## 2015-09-15 NOTE — Progress Notes (Signed)
[facility] PROGRESS NOTE  Linda [name] IOM:[id number] DOB: [birth year] DOA: 09/09/2015  PCP: Blanchie Serve, MD  Brief HPI: 81 year old Caucasian female with a past medical history of dementia, seizure disorder, TIA, presented to the hospital with altered mental status. She lives in a skilled nursing facility. Patient is a DO NOT RESUSCITATE. Initially it was felt that her mental status changes could be due to narcotics. However, it appears to be secondary to infection and metabolic derangements. She was also noted to have acute renal failure. Her renal function improved with IV fluids. However, her mental status remained the same. She remained minimally responsive. Palliative medicine was consulted.  Past medical history:  Past Medical History  Diagnosis Date  . Allergy     allergic rhinitis  . Diabetes mellitus     type II  . Hypertension   . Osteopenia   . TIA (transient ischemic attack)   . Contact dermatitis     on buttocks  . Nosebleed   . Anxiety   . Renal insufficiency   . Pyelonephritis   . Dementia   . Chiari malformation   . Seizure disorder ([place])   . ROTATOR CUFF TEAR 10/13/2007    Qualifier: Diagnosis of  By: Marcelino Scot CMA, [place] Bilberry    . AKI (acute kidney injury) ([place]) 09/10/2015  . Cystitis 09/10/2015  . COPD, mild ([place])   . Arthritis     Consultants: Palliative medicine  Procedures: None  Antibiotics: Zosyn  Subjective: Patient appears to be in discomfort. Her daughter is at the bedside. Patient does not communicate.   Objective: Vital Signs  Filed Vitals:   09/14/15 2028 09/14/15 2352 09/15/15 0437 09/15/15 0937  BP: 146/64  126/73   Pulse: 73  71   Temp: 99.7 F (37.6 C)  101.1 F (38.4 C) 98.6 F (37 C)  TempSrc: Axillary  Oral Axillary  Resp: 17  18   Weight:   77.747 kg (171 lb 6.4 oz)   SpO2: 97% 94% 90%     Intake/Output Summary (Last 24 hours) at 09/15/15 0949 Last data filed at 09/15/15 0440  Gross per 24 hour    Intake   5220 ml  Output   1350 ml  Net   3870 ml   Filed Weights   09/09/15 2234 09/13/15 0453 09/15/15 0437  Weight: 70.171 kg (154 lb 11.2 oz) 75.116 kg (165 lb 9.6 oz) 77.747 kg (171 lb 6.4 oz)    General appearance: Minimally responsive Resp: Course breath sounds bilaterally with rhonchi at the right base. Crackles noted bilateral bases.  Cardio: regular rate and rhythm, S1, S2 normal, no murmur, click, rub or gallop GI: soft, non-tender; bowel sounds normal; no masses,  no organomegaly Neurologic: Responsive to painful stimuli only.  Lab Results:  Basic Metabolic Panel:  Recent Labs Lab 09/10/15 0445  09/11/15 0438 09/12/15 0415 09/13/15 0844 09/14/15 0535 09/15/15 0614  NA 150*  < > 143 146* 141 140 138  K 5.1  < > 5.9* 5.3* 4.7 4.2 4.0  CL 109  < > 107 108 105 103 102  CO2 32  < > 25 26 26 26 26   GLUCOSE 145*  < > 100* 134* 155* 154* 159*  BUN 40*  < > 47* 67* 84* 73* 55*  CREATININE 3.01*  < > 3.98* 4.22* 3.18* 1.87* 1.34*  CALCIUM 8.9  < > 8.0* 8.6* 9.0 9.1 8.6*  MG 1.5*  --   --   --   --   --   --  PHOS  --   --   --  4.0  --   --   --   < > = values in this interval not displayed.  Liver Function Tests:  Recent Labs Lab 09/09/15 1935 09/10/15 0445 09/12/15 0415  AST 15 76*  --   ALT 13* 12*  --   ALKPHOS 41 40  --   BILITOT 0.4 0.2*  --   PROT 5.8* 5.8*  --   ALBUMIN 3.7 3.3* 2.6*   CBC:  Recent Labs Lab 09/09/15 1935 09/10/15 0445 09/12/15 0415 09/13/15 0844 09/14/15 0535 09/15/15 0614  WBC 12.8* 19.7* 16.8* 13.3* 11.3* 10.4  NEUTROABS 10.4* 17.5* 15.4*  --   --   --   HGB 10.9* 10.4* 9.7* 9.5* 9.7* 9.7*  HCT 35.2* 33.1* 31.3* 29.6* 28.4* 27.9*  MCV 97.8 100.3* 97.5 94.0 91.6 91.5  PLT 207 222 195 207 197 176   CBG:  Recent Labs Lab 09/14/15 1602 09/14/15 2032 09/15/15 0002 09/15/15 0435 09/15/15 0801  GLUCAP 145* 152* 141* 135* 156*    Recent Results (from the past 240 hour(s))  Urine culture     Status: None    Collection Time: 09/09/15  6:55 PM  Result Value Ref Range Status   Specimen Description URINE, RANDOM  Final   Special Requests NONE  Final   Culture NO GROWTH 1 DAY  Final   Report Status 09/10/2015 FINAL  Final  Culture, blood (routine x 2)     Status: None   Collection Time: 09/10/15 12:04 AM  Result Value Ref Range Status   Specimen Description BLOOD LEFT HAND  Final   Special Requests IN PEDIATRIC BOTTLE 1CC  Final   Culture NO GROWTH 5 DAYS  Final   Report Status 09/15/2015 FINAL  Final  Culture, blood (routine x 2)     Status: None   Collection Time: 09/10/15 12:10 AM  Result Value Ref Range Status   Specimen Description BLOOD RIGHT HAND  Final   Special Requests IN PEDIATRIC BOTTLE 1CC  Final   Culture NO GROWTH 5 DAYS  Final   Report Status 09/15/2015 FINAL  Final      Studies/Results: No results found.  Medications:  Scheduled: . antiseptic oral rinse  7 mL Mouth Rinse BID  . enoxaparin (LOVENOX) injection  30 mg Subcutaneous Q24H  . insulin aspart  0-9 Units Subcutaneous 6 times per day  . levETIRAcetam  500 mg Intravenous Q12H  . metoprolol  2.5 mg Intravenous 3 times per day  . nystatin cream  1 application Topical Daily  . piperacillin-tazobactam (ZOSYN)  IV  3.375 g Intravenous 3 times per day   Continuous: . dextrose 100 mL/hr at 09/15/15 0450   RSW:[id number] **OR** acetaminophen, albuterol, morphine injection, ondansetron (ZOFRAN) IV  Assessment/Plan:  Principal Problem:   UTI (lower urinary tract infection) Active Problems:   Essential hypertension   Anxiety   Acute on chronic renal failure (HCC)   Acute encephalopathy   DM (diabetes mellitus) type II controlled with renal manifestation (HCC)   Seizure disorder (HCC)   Dementia with behavioral disturbance   Acute cystitis with hematuria   Altered mental status   PAF (paroxysmal atrial fibrillation) ([place])   Palliative care encounter    Toxic metabolic encephalopathy Likely due to  infection and renal failure. Patient's mental status has remained unchanged for the most part. Continue to monitor closely. Not a candidate for aggressive workup. Patient does appear to be in discomfort this morning. We  will appreciate palliative medicine input in this matter. Continue morphine for now.  UTI versus pyelonephritis Continue antibiotics. Urine cultures did not show any growth so far.  Aspiration pneumonia X-ray shows worsening right lower lobe infiltrate. Patient is likely aspirating. Continue Zosyn for now. Prognosis remains poor.  Acute kidney injury Baseline creatinine is around 1.7. Creatinine was getting worse with worsening BUN. She was noted to be significantly volume depleted. Rate of IV fluids was increased and now creatinine is improving. BUN is also getting better. However, her mental status remains unchanged for the most part. Renal ultrasound report reviewed  Paroxysmal atrial fibrillation on 11/2 No further recurrence of tachycardia. Seems to be maintaining sinus rhythm with intravenous metoprolol. Atrial fibrillation was likely due to acute illness. She is not a candidate for further workup or for anticoagulation due to her advanced dementia.  History of seizure disorder Continue intravenous Keppra  Hypotension in the setting of known essential hypertension Blood pressure medications on hold. Give fluid boluses.  Type 2 diabetes Monitor blood sugars.  Hypernatremia Secondary to dehydration. Improved with hydration.   History of dementia Continue to monitor.  Overall, patient has not made much clinical improvement despite improvement in her numbers as discussed above. Her prognosis remains poor. She appears to be in significant discomfort. Discussed briefly with the daughter at bedside. Palliative medicine is supposed to have a meeting with the family today.   DVT Prophylaxis: Lovenox    Code Status: DO NOT RESUSCITATE  Family Communication: Discussed with  the patient's daughter  Disposition Plan: Continue current management. Prognosis remains poor.      LOS: 5 days   [facility] Pager [phone] 09/15/2015, 9:49 AM  If 7PM-7AM, please contact night-coverage at [email or web address], password [facility]

## 2015-09-16 DIAGNOSIS — J690 Pneumonitis due to inhalation of food and vomit: Secondary | ICD-10-CM

## 2015-09-16 LAB — BASIC METABOLIC PANEL
ANION GAP: 8 (ref 5–15)
BUN: 57 mg/dL — ABNORMAL HIGH (ref 6–20)
CALCIUM: 8.4 mg/dL — AB (ref 8.9–10.3)
CHLORIDE: 104 mmol/L (ref 101–111)
CO2: 25 mmol/L (ref 22–32)
Creatinine, Ser: 1.37 mg/dL — ABNORMAL HIGH (ref 0.44–1.00)
GFR calc non Af Amer: 35 mL/min — ABNORMAL LOW (ref >60)
GFR, EST AFRICAN AMERICAN: 40 mL/min — AB (ref >60)
Glucose, Bld: 106 mg/dL — ABNORMAL HIGH (ref 65–99)
Potassium: 4.6 mmol/L (ref 3.5–5.1)
Sodium: 137 mmol/L (ref 135–145)

## 2015-09-16 LAB — GLUCOSE, CAPILLARY
GLUCOSE-CAPILLARY: 107 mg/dL — AB (ref 65–99)
GLUCOSE-CAPILLARY: 101 mg/dL — AB (ref 65–99)
Glucose-Capillary: 105 mg/dL — ABNORMAL HIGH (ref 65–99)
Glucose-Capillary: 119 mg/dL — ABNORMAL HIGH (ref 65–99)
Glucose-Capillary: 125 mg/dL — ABNORMAL HIGH (ref 65–99)
Glucose-Capillary: 128 mg/dL — ABNORMAL HIGH (ref 65–99)

## 2015-09-16 NOTE — Progress Notes (Signed)
Daily Progress Note   Patient Name: Linda [name]       Date: 09/16/2015 DOB: 03-Dec-1929  Age: 81 y.o. MRN#: [id number] Attending Physician: Bonnielee Haff, MD Primary Care Physician: Blanchie Serve, MD Admit Date: 09/09/2015  Reason for Consultation/Follow-up: Non pain symptom management, Pain control and Psychosocial/spiritual support  Subjective: Pt still minimally responsive. Daughter at bedside . She has asked RN's to hold MS04 at times and adm ativan. Supported their desicions Interval Events: Comfort care continued as well as antibiotics Length of Stay: 6 days  Current Medications: Scheduled Meds:  . acetaminophen  1,000 mg Intravenous 4 times per day  . antiseptic oral rinse  7 mL Mouth Rinse BID  . enoxaparin (LOVENOX) injection  30 mg Subcutaneous Q24H  . insulin aspart  0-9 Units Subcutaneous 6 times per day  . levETIRAcetam  500 mg Intravenous Q12H  . metoprolol  2.5 mg Intravenous 3 times per day  .  morphine injection  1 mg Intravenous 6 times per day  . nystatin cream  1 application Topical Daily  . piperacillin-tazobactam (ZOSYN)  IV  3.375 g Intravenous 3 times per day    Continuous Infusions: . dextrose 75 mL/hr at 09/16/15 0402    PRN Meds: acetaminophen **OR** acetaminophen, albuterol, LORazepam, morphine injection, ondansetron (ZOFRAN) IV  Physical Exam: Physical Exam  Constitutional: She appears well-developed.  HENT:  Head: Normocephalic.  Neck:  Neck flexed forward  Cardiovascular: Normal rate and regular rhythm.   Pulmonary/Chest: Effort normal.  Genitourinary:  foley  Neurological:  Minimally responsive  Skin: Skin is warm and dry.                Vital Signs: BP 137/56 mmHg  Pulse 58  Temp(Src) 100.8 F (38.2 C) (Axillary)  Resp 19   Wt 78.2 kg (172 lb 6.4 oz)  SpO2 94% SpO2: SpO2: 94 % O2 Device: O2 Device: NRB O2 Flow Rate: O2 Flow Rate (L/min): 10 L/min  Intake/output summary:  Intake/Output Summary (Last 24 hours) at 09/16/15 1348 Last data filed at 09/16/15 0500  Gross per 24 hour  Intake 2506.67 ml  Output   1150 ml  Net 1356.67 ml   LBM:   Baseline Weight: Weight: 70.171 kg (154 lb 11.2 oz) Most recent weight: Weight: 78.2 kg (172 lb 6.4 oz)  Palliative Assessment/Data: Flowsheet Rows        Most Recent Value   Intake Tab    Referral Department  Hospitalist   Unit at Time of Referral  Med/Surg Unit   Palliative Care Primary Diagnosis  Sepsis/Infectious Disease   Date Notified  09/09/15   Palliative Care Type  New Palliative care   Reason for referral  Clarify Goals of Care, Pain   Date of Admission  09/09/15   Date first seen by Palliative Care  09/14/15   # of days Palliative referral response time  5 Day(s)   # of days IP prior to Palliative referral  0   Clinical Assessment    Palliative Performance Scale Score  10%   Pain Max last 24 hours  Other (Comment)   Pain Min Last 24 hours  Other (Comment)   Dyspnea Max Last 24 Hours  Other (Comment)   Dyspnea Min Last 24 hours  Other (Comment)   Nausea Max Last 24 Hours  Other (Comment)   Nausea Min Last 24 Hours  Other (Comment)   Anxiety Max Last 24 Hours  Other (Comment)   Anxiety Min Last 24 Hours  Other (Comment)   Other Max Last 24 Hours  Other (Comment)   Psychosocial & Spiritual Assessment    Palliative Care Outcomes    Patient/Family meeting held?  Yes   Who was at the meeting?  daughte Norberta Keens, and [place] Outcomes  Improved pain interventions, Improved non-pain symptom therapy, Clarified goals of care   Palliative Care follow-up planned  Yes, Facility      Additional Data Reviewed: Recent Labs     09/14/15  0535  09/15/15  0614  09/16/15  0139  WBC  11.3*  10.4   --   HGB  9.7*  9.7*   --   PLT   197  176   --   NA  140  138  137  BUN  73*  55*  57*  CREATININE  1.87*  1.34*  1.37*     Problem List:  Patient Active Problem List   Diagnosis Date Noted  . PAF (paroxysmal atrial fibrillation) ([place]) 09/14/2015  . Palliative care encounter   . Acute cystitis with hematuria   . Altered mental status   . Dementia   . Pulmonary hypertension ([place])   . Diabetes mellitus type 2, insulin dependent ([place])   . Daytime somnolence 07/22/2015  . Depression due to dementia 06/07/2015  . CKD (chronic kidney disease) 02/12/2015  . Osteoporosis 02/12/2015  . Anemia of chronic disease 02/12/2015  . Dementia with behavioral disturbance 02/12/2015  . Protein-calorie malnutrition ([place]) 02/12/2015  . COPD, mild ([place]) 09/28/2014  . Hypoxia 09/05/2014  . Depression with anxiety 08/30/2014  . UI (urinary incontinence) 08/25/2014  . Dementia without behavioral disturbance 08/25/2014  . Constipation 08/25/2014  . UTI (lower urinary tract infection) 08/21/2014  . DM (diabetes mellitus) type II controlled with renal manifestation ([place]) 08/21/2014  . Seizure disorder ([place]) 08/21/2014  . Acute encephalopathy 04/10/2014  . Fall at home 06/11/2013  . Orthostatic hypotension 06/11/2013  . Acute on chronic renal failure ([place]) 06/11/2013  . Anemia 08/27/2012  . Benign hypertensive heart disease without heart failure 06/22/2012  . Anxiety 06/08/2011  . VENOUS INSUFFICIENCY, LEGS 06/28/2009  . EDEMA 06/20/2009  . Obesity 04/26/2008  . GOITER, MULTINODULAR 10/13/2007  . HYPERCHOLESTEROLEMIA 10/13/2007  . Essential hypertension 10/13/2007  . ALLERGIC RHINITIS 10/13/2007  . RENAL INSUFFICIENCY 10/13/2007  .  DEGENERATIVE DISC DISEASE, CERVICAL SPINE 10/13/2007  . Disorder of bone and cartilage 10/13/2007  . TRANSIENT ISCHEMIC ATTACK, HX OF 10/13/2007  . DIABETES MELLITUS, TYPE II 03/25/2007     Palliative Care Assessment & Plan    1.Code Status:  DNR    Code Status Orders        Start      Ordered   09/09/15 2119  Do not attempt resuscitation (DNR)   Continuous    Question Answer Comment  In the event of cardiac or respiratory ARREST Do not call a "code blue"   In the event of cardiac or respiratory ARREST Do not perform Intubation, CPR, defibrillation or ACLS   In the event of cardiac or respiratory ARREST Use medication by any route, position, wound care, and other measures to relive pain and suffering. May use oxygen, suction and manual treatment of airway obstruction as needed for comfort.      09/09/15 2121    Advance Directive Documentation        Most Recent Value   Type of Advance Directive  Healthcare Power of Attorney   Pre-existing out of facility DNR order (yellow form or pink MOST form)     "MOST" Form in Place?         2. Goals of Care:  Watchful waiting over next 24 hours but if no improvement family in agreement for in-patient hospice  Continue antibiotics and fluids for now  Desire for further Chaplaincy support:no   3. Symptom Management:      1.Pain: Improving. Cont  sch MS04 1 mg q 4 atc and q2 prn. May hold doses per family request      2.Dyspnea: Cont 02 via NRB. No not escalate to BIPAP. Primary management with opioids      3. Agitation: Cont ativan prn. Monitor for need for scheduled dosing. Family at this point wants to keep          PRN  4. Palliative Prophylaxis:   Bowel Regimen, Frequent Pain Assessment, Oral Care and Turn Reposition  5. Prognosis: Hours - Days  6. Discharge Planning:  Likely hospice facility. Still in a watchful waiting mode over next 24 hours but if no improvement will place consult to SW for in-pt hospice. PMT will continue to follow and adjust meds for symptom mgt as well as finalizing disposition with family  Thank you for allowing the Palliative Medicine Team to assist in the care of this patient.   Time In: 1300 Time Out: 1330 Total Time 30 min Prolonged Time Billed  no         Dory Horn,  NP  09/16/2015, 1:48 PM  Please contact Palliative Medicine Team phone at [phone] for questions and concerns.

## 2015-09-16 NOTE — Progress Notes (Signed)
Prn ativan and prn morphine administered alternatively for anxiety/pain as requested by family

## 2015-09-16 NOTE — Progress Notes (Signed)
Prn iv morphine administered as requested by pt's daughter

## 2015-09-16 NOTE — Progress Notes (Signed)
[facility] PROGRESS NOTE  Linda [name] UJW:[id number] DOB: 1930-01-13 DOA: 09/09/2015  PCP: Blanchie Serve, MD  Brief HPI: 81 year old Caucasian female with a past medical history of dementia, seizure disorder, TIA, presented to the hospital with altered mental status. She lives in a skilled nursing facility. Patient is a DO NOT RESUSCITATE. Initially it was felt that her mental status changes could be due to narcotics. However, it appears to be secondary to infection and metabolic derangements. She was also noted to have acute renal failure. Her renal function improved with IV fluids. However, her mental status remained the same. She remained minimally responsive. Palliative medicine was consulted.  Past medical history:  Past Medical History  Diagnosis Date  . Allergy     allergic rhinitis  . Diabetes mellitus     type II  . Hypertension   . Osteopenia   . TIA (transient ischemic attack)   . Contact dermatitis     on buttocks  . Nosebleed   . Anxiety   . Renal insufficiency   . Pyelonephritis   . Dementia   . Chiari malformation   . Seizure disorder ([place])   . ROTATOR CUFF TEAR 10/13/2007    Qualifier: Diagnosis of  By: Marcelino Scot CMA, [place] Bilberry    . AKI (acute kidney injury) ([place]) 09/10/2015  . Cystitis 09/10/2015  . COPD, mild ([place])   . Arthritis     Consultants: Palliative medicine  Procedures: None  Antibiotics: Zosyn  Subjective: Patient appears to be more comfortable this morning. Very minimally responsive as before. Does not appear to be in any pain. Her daughter is at the bedside.   Objective: Vital Signs  Filed Vitals:   09/15/15 1954 09/15/15 2304 09/16/15 0027 09/16/15 0417  BP: 128/49   137/56  Pulse: 78   58  Temp: 97.2 F (36.2 C) 99 F (37.2 C) 98.6 F (37 C) 98.6 F (37 C)  TempSrc: Oral   Axillary  Resp: 18   19  Weight:    78.2 kg (172 lb 6.4 oz)  SpO2: 94%   94%    Intake/Output Summary (Last 24 hours) at 09/16/15  0852 Last data filed at 09/16/15 0500  Gross per 24 hour  Intake 2506.67 ml  Output   1150 ml  Net 1356.67 ml   Filed Weights   09/13/15 0453 09/15/15 0437 09/16/15 0417  Weight: 75.116 kg (165 lb 9.6 oz) 77.747 kg (171 lb 6.4 oz) 78.2 kg (172 lb 6.4 oz)    General appearance: Minimally responsive Resp: Continues to have Coarse breath sounds bilaterally with rhonchi at the right base. Crackles noted bilateral bases.  Cardio: regular rate and rhythm, S1, S2 normal, no murmur, click, rub or gallop GI: soft, non-tender; bowel sounds normal; no masses,  no organomegaly Neurologic: Minimally responsive to voice  Lab Results:  Basic Metabolic Panel:  Recent Labs Lab 09/10/15 0445  09/12/15 0415 09/13/15 0844 09/14/15 0535 09/15/15 0614 09/16/15 0139  NA 150*  < > 146* 141 140 138 137  K 5.1  < > 5.3* 4.7 4.2 4.0 4.6  CL 109  < > 108 105 103 102 104  CO2 32  < > 26 26 26 26 25   GLUCOSE 145*  < > 134* 155* 154* 159* 106*  BUN 40*  < > 67* 84* 73* 55* 57*  CREATININE 3.01*  < > 4.22* 3.18* 1.87* 1.34* 1.37*  CALCIUM 8.9  < > 8.6* 9.0 9.1 8.6* 8.4*  MG 1.5*  --   --   --   --   --   --  PHOS  --   --  4.0  --   --   --   --   < > = values in this interval not displayed.  Liver Function Tests:  Recent Labs Lab 09/09/15 1935 09/10/15 0445 09/12/15 0415  AST 15 76*  --   ALT 13* 12*  --   ALKPHOS 41 40  --   BILITOT 0.4 0.2*  --   PROT 5.8* 5.8*  --   ALBUMIN 3.7 3.3* 2.6*   CBC:  Recent Labs Lab 09/09/15 1935 09/10/15 0445 09/12/15 0415 09/13/15 0844 09/14/15 0535 09/15/15 0614  WBC 12.8* 19.7* 16.8* 13.3* 11.3* 10.4  NEUTROABS 10.4* 17.5* 15.4*  --   --   --   HGB 10.9* 10.4* 9.7* 9.5* 9.7* 9.7*  HCT 35.2* 33.1* 31.3* 29.6* 28.4* 27.9*  MCV 97.8 100.3* 97.5 94.0 91.6 91.5  PLT 207 222 195 207 197 176   CBG:  Recent Labs Lab 09/15/15 1622 09/15/15 1951 09/15/15 2352 09/16/15 0423 09/16/15 0812  GLUCAP 100* 118* 119* 105* 128*    Recent Results  (from the past 240 hour(s))  Urine culture     Status: None   Collection Time: 09/09/15  6:55 PM  Result Value Ref Range Status   Specimen Description URINE, RANDOM  Final   Special Requests NONE  Final   Culture NO GROWTH 1 DAY  Final   Report Status 09/10/2015 FINAL  Final  Culture, blood (routine x 2)     Status: None   Collection Time: 09/10/15 12:04 AM  Result Value Ref Range Status   Specimen Description BLOOD LEFT HAND  Final   Special Requests IN PEDIATRIC BOTTLE 1CC  Final   Culture NO GROWTH 5 DAYS  Final   Report Status 09/15/2015 FINAL  Final  Culture, blood (routine x 2)     Status: None   Collection Time: 09/10/15 12:10 AM  Result Value Ref Range Status   Specimen Description BLOOD RIGHT HAND  Final   Special Requests IN PEDIATRIC BOTTLE 1CC  Final   Culture NO GROWTH 5 DAYS  Final   Report Status 09/15/2015 FINAL  Final      Studies/Results: No results found.  Medications:  Scheduled: . acetaminophen  1,000 mg Intravenous 4 times per day  . antiseptic oral rinse  7 mL Mouth Rinse BID  . enoxaparin (LOVENOX) injection  30 mg Subcutaneous Q24H  . insulin aspart  0-9 Units Subcutaneous 6 times per day  . levETIRAcetam  500 mg Intravenous Q12H  . metoprolol  2.5 mg Intravenous 3 times per day  .  morphine injection  1 mg Intravenous 6 times per day  . nystatin cream  1 application Topical Daily  . piperacillin-tazobactam (ZOSYN)  IV  3.375 g Intravenous 3 times per day   Continuous: . dextrose 75 mL/hr at 09/16/15 0402   LFY:BOFBPZWCHENID **OR** acetaminophen, albuterol, LORazepam, morphine injection, ondansetron (ZOFRAN) IV  Assessment/Plan:  Principal Problem:   UTI (lower urinary tract infection) Active Problems:   Essential hypertension   Anxiety   Acute on chronic renal failure (HCC)   Acute encephalopathy   DM (diabetes mellitus) type II controlled with renal manifestation (HCC)   Seizure disorder (HCC)   Dementia with behavioral disturbance    Acute cystitis with hematuria   Altered mental status   PAF (paroxysmal atrial fibrillation) ([place])   Palliative care encounter    Toxic metabolic encephalopathy Likely due to infection and renal failure. Patient's mental status has remained  unchanged for the most part. Continue to monitor closely. Not a candidate for aggressive workup. Pain appears to be better controlled. Appreciate palliative medicine input.   UTI versus pyelonephritis Continue antibiotics. Urine cultures did not show any growth so far.   Aspiration pneumonia X-ray shows worsening right lower lobe infiltrate. Patient is likely aspirating. Continue Zosyn for now. Prognosis remains poor.  Acute kidney injury Improved. Baseline creatinine is around 1.7. Creatinine was getting worse with worsening BUN. She was noted to be significantly volume depleted. Rate of IV fluids was increased and now creatinine is improving. BUN is also getting better. However, her mental status remains unchanged for the most part. Renal ultrasound report reviewed  Paroxysmal atrial fibrillation on 11/2 No further recurrence of tachycardia. Seems to be maintaining sinus rhythm with intravenous metoprolol. Atrial fibrillation was likely due to acute illness. She is not a candidate for further workup or for anticoagulation due to her advanced dementia.  History of seizure disorder Continue intravenous Keppra  Hypotension in the setting of known essential hypertension Blood pressure medications on hold. Give fluid boluses.  Type 2 diabetes Monitor blood sugars.  Hypernatremia Secondary to dehydration. Improved with hydration.   History of dementia Continue to monitor.  Patient has not made much clinical improvement. Her prognosis remains poor. Discussed briefly with the daughter at bedside. Palliative medicine . Did have a family meeting yesterday. Plan is to observe on current treatment for another 24-48 hours. If patient does not improve  plan is for sending her to residential hospice.   DVT Prophylaxis: Lovenox    Code Status: DO NOT RESUSCITATE  Family Communication: Discussed with the patient's daughter  Disposition Plan: Continue current management. Prognosis remains poor.     LOS: 6 days   [facility] Pager [phone] 09/16/2015, 8:52 AM  If 7PM-7AM, please contact night-coverage at [email or web address], password [facility]

## 2015-09-16 NOTE — Progress Notes (Signed)
Prn ativan administered and scheduled morphine held this morning per family request

## 2015-09-16 NOTE — Plan of Care (Signed)
Problem: Consults Goal: UTI/Pyelonephritis Patient Education See Patient Education Module for education specifics.  Outcome: Progressing Continues on iv zosyn  Problem: Phase I Progression Outcomes Goal: Pain controlled with appropriate interventions Outcome: Progressing Pain medicine scheduled every 4hours Goal: Voiding-avoid urinary catheter unless indicated Outcome: Progressing Foley catheter in place  Problem: Phase II Progression Outcomes Goal: Progress activity as tolerated unless otherwise ordered Outcome: Not Applicable Date Met:  32/67/12 Pt is bedbound  Problem: Phase III Progression Outcomes Goal: Afebrile, Vital Signs remain stable Outcome: Progressing Pt had a low grade fever this pm,no fever noted this evening  Problem: Discharge Progression Outcomes Goal: Complications resolved/controlled Outcome: Not Progressing Respiratory issues persist

## 2015-09-16 NOTE — Progress Notes (Signed)
Family at pt bedside all day, very involved in pt's care. No concerns expressed on this shift. Pt randomly opens eyes briefly and makes attempts to talk but words unclear. No s/s of distress noted at this time

## 2015-09-17 LAB — GLUCOSE, CAPILLARY
GLUCOSE-CAPILLARY: 108 mg/dL — AB (ref 65–99)
GLUCOSE-CAPILLARY: 110 mg/dL — AB (ref 65–99)
GLUCOSE-CAPILLARY: 97 mg/dL (ref 65–99)
Glucose-Capillary: 113 mg/dL — ABNORMAL HIGH (ref 65–99)
Glucose-Capillary: 118 mg/dL — ABNORMAL HIGH (ref 65–99)
Glucose-Capillary: 118 mg/dL — ABNORMAL HIGH (ref 65–99)
Glucose-Capillary: 125 mg/dL — ABNORMAL HIGH (ref 65–99)

## 2015-09-17 NOTE — Progress Notes (Signed)
Nutrition Brief Note  Chart reviewed. Pt now transitioning to comfort care.  No further nutrition interventions warranted at this time.  Please re-consult as needed.   Claus Silvestro A. [place] Joshi, RD, LDN, CDE Pager: [phone] After hours Pager: [phone]  

## 2015-09-17 NOTE — Progress Notes (Signed)
Daily Progress Note   Patient Name: Linda [name]       Date: 09/17/2015 DOB: 1930-03-05  Age: 81 y.o. MRN#: [id number] Attending Physician: Bonnielee Haff, MD Primary Care Physician: Blanchie Serve, MD Admit Date: 09/09/2015  Reason for Consultation/Follow-up: Establishing goals of care  Subjective: I met today with Ms. Oats's daughter, Enid Derry, and niece Audelia Acton at bedside. Enid Derry is concerned as her sister [name] is very much struggling with this decision. [name] did not return back today while I was there but I will meet her tomorrow morning. Enid Derry tells me that she and her sister [name]) agree with comfort and know that their mother is dying and do not want to see her remain in this condition. Enid Derry says that [name] sees their mother open her eyes and her oxygen/vital signs normal and becomes hopeful for recovery. Unfortunately Ms. Lutzke is on nonrebreather + 10L nasal cannula and is minimally responsive - unable to eat or drink or thrive. I assured Enid Derry that I will return in the morning to see how I can help [name] deal with her emotions and expectations.   Length of Stay: 7 days  Current Medications: Scheduled Meds:  . antiseptic oral rinse  7 mL Mouth Rinse BID  . enoxaparin (LOVENOX) injection  30 mg Subcutaneous Q24H  . insulin aspart  0-9 Units Subcutaneous 6 times per day  . levETIRAcetam  500 mg Intravenous Q12H  . metoprolol  2.5 mg Intravenous 3 times per day  .  morphine injection  1 mg Intravenous 6 times per day  . nystatin cream  1 application Topical Daily  . piperacillin-tazobactam (ZOSYN)  IV  3.375 g Intravenous 3 times per day    Continuous Infusions: . dextrose 75 mL/hr at 09/16/15 0402    PRN Meds: acetaminophen **OR** acetaminophen, albuterol,  LORazepam, morphine injection, ondansetron (ZOFRAN) IV  Physical Exam: Physical Exam  Constitutional: She appears well-developed.  Cardiovascular: Normal rate.   Pulmonary/Chest: No accessory muscle usage. No tachypnea. No respiratory distress.  Abdominal: Soft. Normal appearance. She exhibits no distension.  Neurological: She is unresponsive.  Opens eyes occasionally randomly - not to command. No tracking.                 Vital Signs: BP 125/56 mmHg  Pulse 71  Temp(Src) 98.6 F (37  C) (Axillary)  Resp 18  Wt 75.8 kg (167 lb 1.7 oz)  SpO2 98% SpO2: SpO2: 98 % O2 Device: O2 Device: NRB O2 Flow Rate: O2 Flow Rate (L/min): 10 L/min  Intake/output summary:  Intake/Output Summary (Last 24 hours) at 09/17/15 1200 Last data filed at 09/17/15 0900  Gross per 24 hour  Intake      0 ml  Output    950 ml  Net   -950 ml   Baseline Weight: Weight: 70.171 kg (154 lb 11.2 oz) Most recent weight: Weight: 75.8 kg (167 lb 1.7 oz)       Palliative Assessment/Data: Flowsheet Rows        Most Recent Value   Intake Tab    Referral Department  Hospitalist   Unit at Time of Referral  Med/Surg Unit   Palliative Care Primary Diagnosis  Sepsis/Infectious Disease   Date Notified  09/09/15   Palliative Care Type  New Palliative care   Reason for referral  Clarify Goals of Care, Pain   Date of Admission  09/09/15   Date first seen by Palliative Care  09/14/15   # of days Palliative referral response time  5 Day(s)   # of days IP prior to Palliative referral  0   Clinical Assessment    Palliative Performance Scale Score  10%   Pain Max last 24 hours  Other (Comment)   Pain Min Last 24 hours  Other (Comment)   Dyspnea Max Last 24 Hours  Other (Comment)   Dyspnea Min Last 24 hours  Other (Comment)   Nausea Max Last 24 Hours  Other (Comment)   Nausea Min Last 24 Hours  Other (Comment)   Anxiety Max Last 24 Hours  Other (Comment)   Anxiety Min Last 24 Hours  Other (Comment)   Other Max Last  24 Hours  Other (Comment)   Psychosocial & Spiritual Assessment    Palliative Care Outcomes    Patient/Family meeting held?  Yes   Who was at the meeting?  daughte Norberta Keens, and [place] Outcomes  Improved pain interventions, Improved non-pain symptom therapy, Clarified goals of care   Palliative Care follow-up planned  Yes, Facility      Additional Data Reviewed: Recent Labs     09/15/15  0614  09/16/15  0139  WBC  10.4   --   HGB  9.7*   --   PLT  176   --   NA  138  137  BUN  55*  57*  CREATININE  1.34*  1.37*     Problem List:  Patient Active Problem List   Diagnosis Date Noted  . PAF (paroxysmal atrial fibrillation) ([place]) 09/14/2015  . Palliative care encounter   . Acute cystitis with hematuria   . Altered mental status   . Dementia   . Pulmonary hypertension ([place])   . Diabetes mellitus type 2, insulin dependent ([place])   . Daytime somnolence 07/22/2015  . Depression due to dementia 06/07/2015  . CKD (chronic kidney disease) 02/12/2015  . Osteoporosis 02/12/2015  . Anemia of chronic disease 02/12/2015  . Dementia with behavioral disturbance 02/12/2015  . Protein-calorie malnutrition ([place]) 02/12/2015  . COPD, mild ([place]) 09/28/2014  . Hypoxia 09/05/2014  . Depression with anxiety 08/30/2014  . UI (urinary incontinence) 08/25/2014  . Dementia without behavioral disturbance 08/25/2014  . Constipation 08/25/2014  . UTI (lower urinary tract infection) 08/21/2014  . DM (diabetes mellitus) type II controlled with renal  manifestation ([place]) 08/21/2014  . Seizure disorder ([place]) 08/21/2014  . Acute encephalopathy 04/10/2014  . Fall at home 06/11/2013  . Orthostatic hypotension 06/11/2013  . Acute on chronic renal failure ([place]) 06/11/2013  . Anemia 08/27/2012  . Benign hypertensive heart disease without heart failure 06/22/2012  . Anxiety 06/08/2011  . VENOUS INSUFFICIENCY, LEGS 06/28/2009  . EDEMA 06/20/2009  . Obesity 04/26/2008  . GOITER,  MULTINODULAR 10/13/2007  . HYPERCHOLESTEROLEMIA 10/13/2007  . Essential hypertension 10/13/2007  . ALLERGIC RHINITIS 10/13/2007  . RENAL INSUFFICIENCY 10/13/2007  . DEGENERATIVE DISC DISEASE, CERVICAL SPINE 10/13/2007  . Disorder of bone and cartilage 10/13/2007  . TRANSIENT ISCHEMIC ATTACK, HX OF 10/13/2007  . DIABETES MELLITUS, TYPE II 03/25/2007     Palliative Care Assessment & Plan    1.Code Status:  DNR    Code Status Orders        Start     Ordered   09/09/15 2119  Do not attempt resuscitation (DNR)   Continuous    Question Answer Comment  In the event of cardiac or respiratory ARREST Do not call a "code blue"   In the event of cardiac or respiratory ARREST Do not perform Intubation, CPR, defibrillation or ACLS   In the event of cardiac or respiratory ARREST Use medication by any route, position, wound care, and other measures to relive pain and suffering. May use oxygen, suction and manual treatment of airway obstruction as needed for comfort.      09/09/15 2121    Advance Directive Documentation        Most Recent Value   Type of Advance Directive  Healthcare Power of Attorney   Pre-existing out of facility DNR order (yellow form or pink MOST form)     "MOST" Form in Place?         2. Goals of Care:  Family disconnected as one daughter struggling with poor prognosis and fear that decisions for comfort will cause her guilt and feels her decisions are killing her mother.   Desire for further Chaplaincy support:yes  Psycho-social Needs: Education on Hospice  3. Symptom Management:      1.Pain: Improving. Morphine 1 mg every 4 hours and every 2 hours prn. May hold doses per family request.   2. Dyspnea: Cont 02 via NRB. No not escalate to BIPAP. Primary management with opioids  3. Agitation: Cont ativan prn. Monitor for need for scheduled dosing. Family at this point wants to keep prn.   4. Palliative Prophylaxis:   Bowel Regimen, Delirium Protocol  and Turn Reposition  5. Prognosis: < 2 weeks  6. Discharge Planning:  Hospice facility vs hospital death.      Thank you for allowing the Palliative Medicine Team to assist in the care of this patient.   Time In: 1100-1120, 1510-1530 Total Time 97mn Prolonged Time Billed  no         APershing Proud NP  152/0/8022 12:00 PM  Please contact Palliative Medicine Team phone at [phone] questions and concerns.

## 2015-09-17 NOTE — Progress Notes (Signed)
[facility] PROGRESS NOTE  Linda [name] QJF:[id number] DOB: 27-Jul-1930 DOA: 09/09/2015  PCP: Blanchie Serve, MD  Brief HPI: 81 year old Caucasian female with a past medical history of dementia, seizure disorder, TIA, presented to the hospital with altered mental status. She lives in a skilled nursing facility. Patient is a DO NOT RESUSCITATE. Initially it was felt that her mental status changes could be due to narcotics. However, it appears to be secondary to infection and metabolic derangements. She was also noted to have acute renal failure. Her renal function improved with IV fluids. However, her mental status remained the same. She remained minimally responsive. Palliative medicine was consulted.  Past medical history:  Past Medical History  Diagnosis Date  . Allergy     allergic rhinitis  . Diabetes mellitus     type II  . Hypertension   . Osteopenia   . TIA (transient ischemic attack)   . Contact dermatitis     on buttocks  . Nosebleed   . Anxiety   . Renal insufficiency   . Pyelonephritis   . Dementia   . Chiari malformation   . Seizure disorder ([place])   . ROTATOR CUFF TEAR 10/13/2007    Qualifier: Diagnosis of  By: Marcelino Scot CMA, [place]    . AKI (acute kidney injury) ([place]) 09/10/2015  . Cystitis 09/10/2015  . COPD, mild ([place])   . Arthritis     Consultants: Palliative medicine  Procedures: None  Antibiotics: Zosyn  Subjective: Patient remains poorly responsive. Though she is more comfortable and does not appear to be in much discomfort/pain. Her daughter is at the bedside.   Objective: Vital Signs  Filed Vitals:   09/16/15 1220 09/16/15 1525 09/16/15 2051 09/17/15 0644  BP:  128/50 126/53 125/56  Pulse:  63 60 71  Temp: 100.8 F (38.2 C) 99 F (37.2 C) 97.3 F (36.3 C) 98.6 F (37 C)  TempSrc:  Axillary Axillary Axillary  Resp:  18 17 18   Weight:    75.8 kg (167 lb 1.7 oz)  SpO2:  93% 100% 98%    Intake/Output Summary (Last 24 hours)  at 09/17/15 0847 Last data filed at 09/17/15 0007  Gross per 24 hour  Intake      0 ml  Output    950 ml  Net   -950 ml   Filed Weights   09/15/15 0437 09/16/15 0417 09/17/15 0644  Weight: 77.747 kg (171 lb 6.4 oz) 78.2 kg (172 lb 6.4 oz) 75.8 kg (167 lb 1.7 oz)    General appearance: Minimally responsive Resp: Continues to have Coarse breath sounds bilaterally with rhonchi at the right base. Crackles noted bilateral bases. Not much change in examination. Cardio: regular rate and rhythm, S1, S2 normal, no murmur, click, rub or gallop GI: soft, non-tender; bowel sounds normal; no masses,  no organomegaly Neurologic: Minimally responsive to voice  Lab Results:  Basic Metabolic Panel:  Recent Labs Lab 09/12/15 0415 09/13/15 0844 09/14/15 0535 09/15/15 0614 09/16/15 0139  NA 146* 141 140 138 137  K 5.3* 4.7 4.2 4.0 4.6  CL 108 105 103 102 104  CO2 26 26 26 26 25   GLUCOSE 134* 155* 154* 159* 106*  BUN 67* 84* 73* 55* 57*  CREATININE 4.22* 3.18* 1.87* 1.34* 1.37*  CALCIUM 8.6* 9.0 9.1 8.6* 8.4*  PHOS 4.0  --   --   --   --     Liver Function Tests:  Recent Labs Lab 09/12/15 0415  ALBUMIN 2.6*  CBC:  Recent Labs Lab 09/12/15 0415 09/13/15 0844 09/14/15 0535 09/15/15 0614  WBC 16.8* 13.3* 11.3* 10.4  NEUTROABS 15.4*  --   --   --   HGB 9.7* 9.5* 9.7* 9.7*  HCT 31.3* 29.6* 28.4* 27.9*  MCV 97.5 94.0 91.6 91.5  PLT 195 207 197 176   CBG:  Recent Labs Lab 09/16/15 1633 09/16/15 2026 09/17/15 0004 09/17/15 0426 09/17/15 0801  GLUCAP 107* 101* 97 110* 125*    Recent Results (from the past 240 hour(s))  Urine culture     Status: None   Collection Time: 09/09/15  6:55 PM  Result Value Ref Range Status   Specimen Description URINE, RANDOM  Final   Special Requests NONE  Final   Culture NO GROWTH 1 DAY  Final   Report Status 09/10/2015 FINAL  Final  Culture, blood (routine x 2)     Status: None   Collection Time: 09/10/15 12:04 AM  Result Value Ref  Range Status   Specimen Description BLOOD LEFT HAND  Final   Special Requests IN PEDIATRIC BOTTLE 1CC  Final   Culture NO GROWTH 5 DAYS  Final   Report Status 09/15/2015 FINAL  Final  Culture, blood (routine x 2)     Status: None   Collection Time: 09/10/15 12:10 AM  Result Value Ref Range Status   Specimen Description BLOOD RIGHT HAND  Final   Special Requests IN PEDIATRIC BOTTLE 1CC  Final   Culture NO GROWTH 5 DAYS  Final   Report Status 09/15/2015 FINAL  Final      Studies/Results: No results found.  Medications:  Scheduled: . antiseptic oral rinse  7 mL Mouth Rinse BID  . enoxaparin (LOVENOX) injection  30 mg Subcutaneous Q24H  . insulin aspart  0-9 Units Subcutaneous 6 times per day  . levETIRAcetam  500 mg Intravenous Q12H  . metoprolol  2.5 mg Intravenous 3 times per day  .  morphine injection  1 mg Intravenous 6 times per day  . nystatin cream  1 application Topical Daily  . piperacillin-tazobactam (ZOSYN)  IV  3.375 g Intravenous 3 times per day   Continuous: . dextrose 75 mL/hr at 09/16/15 0402   NWG:[id number] **OR** acetaminophen, albuterol, LORazepam, morphine injection, ondansetron (ZOFRAN) IV  Assessment/Plan:  Principal Problem:   UTI (lower urinary tract infection) Active Problems:   Essential hypertension   Anxiety   Acute on chronic renal failure (HCC)   Acute encephalopathy   DM (diabetes mellitus) type II controlled with renal manifestation (HCC)   Seizure disorder (H[facility])   Dementia with behavioral disturbance   Acute cystitis with hematuria   Altered mental status   PAF (paroxysmal atrial fibrillation) ([place])   Palliative care encounter    Toxic metabolic encephalopathy Likely due to infection and renal failure. Patient's mental status has remained unchanged for the most part. Continue to monitor closely. Pain appears to be better controlled. Appreciate palliative medicine input.   Aspiration pneumonia X-ray shows worsening right lower  lobe infiltrate. Patient is likely aspirating. Continue Zosyn for now. Prognosis remains poor.  UTI versus pyelonephritis Continue antibiotics. Urine cultures did not show any growth so far.   Acute kidney injury Improved. Baseline creatinine is around 1.7. Creatinine was getting worse with worsening BUN. She was noted to be significantly volume depleted. Rate of IV fluids was increased and now creatinine is improving. BUN is also getting better. However, her mental status remains unchanged for the most part. Renal ultrasound report reviewed  Paroxysmal atrial fibrillation on 11/2 No further recurrence of tachycardia. Seems to be maintaining sinus rhythm with intravenous metoprolol. Atrial fibrillation was likely due to acute illness. She is not a candidate for further workup or for anticoagulation due to her advanced dementia.  History of seizure disorder Continue intravenous Keppra as she is unable to safely take orally.  Hypotension in the setting of known essential hypertension Blood pressure medications on hold. Blood pressure has improved with fluid boluses.  Type 2 diabetes Continue to monitor blood sugars. These are reasonably well controlled.  Hypernatremia Secondary to dehydration. Improved with hydration.   History of dementia Continue to monitor.  Patient has not made much clinical improvement. Her prognosis remains poor. Discussed briefly with the daughter at bedside. This daughter hopes for recovery. She has a sister who is the power of attorney. It appears that the 2 daughters are in disagreement as to how to proceed. One of the daughters wants to wait and watch as she expects recovery. We will let palliative medicine address this situation. Patient seems appropriate for residential hospice.   DVT Prophylaxis: Lovenox    Code Status: DO NOT RESUSCITATE  Family Communication: Discussed with one of the patient's daughter who was at bedside Disposition Plan: Prognosis  remains poor. Palliative medicine to determine disposition at this time.    LOS: 7 days   [facility] [facility] Pager [phone] 09/17/2015, 8:47 AM  If 7PM-7AM, please contact night-coverage at [email or web address], password [facility]

## 2015-09-17 NOTE — Care Management Important Message (Signed)
Important Message  Patient Details  Name: Linda [name] MRN: [id number] Date of Birth: 01-30-1930   Medicare Important Message Given:  Yes-third notification given    Loann Quill 09/17/2015, 1:48 PM

## 2015-09-18 LAB — GLUCOSE, CAPILLARY
Glucose-Capillary: 126 mg/dL — ABNORMAL HIGH (ref 65–99)
Glucose-Capillary: 110 mg/dL — ABNORMAL HIGH (ref 65–99)
Glucose-Capillary: 129 mg/dL — ABNORMAL HIGH (ref 65–99)

## 2015-09-18 MED ORDER — LORAZEPAM 2 MG/ML IJ SOLN: 0.2500 mg | INTRAMUSCULAR | Status: AC | PRN

## 2015-09-18 MED ORDER — CETYLPYRIDINIUM CHLORIDE 0.05 % MT LIQD: 7.0000 mL | Freq: Two times a day (BID) | OROMUCOSAL | Status: AC

## 2015-09-18 MED ORDER — GLYCOPYRROLATE 0.2 MG/ML IJ SOLN
0.2000 mg | INTRAMUSCULAR | Status: DC
  Administered 2015-09-18: 0.2 mg via INTRAVENOUS
  Filled 2015-09-18: qty 1

## 2015-09-18 MED ORDER — SODIUM CHLORIDE 0.9 % IV SOLN: 500.0000 mg | Freq: Two times a day (BID) | INTRAVENOUS | Status: AC

## 2015-09-18 MED ORDER — ALBUTEROL SULFATE (2.5 MG/3ML) 0.083% IN NEBU: 2.5000 mg | INHALATION_SOLUTION | RESPIRATORY_TRACT | Status: AC | PRN

## 2015-09-18 MED ORDER — ONDANSETRON HCL 4 MG/2ML IJ SOLN: 4.0000 mg | Freq: Four times a day (QID) | INTRAMUSCULAR | Status: AC | PRN

## 2015-09-18 MED ORDER — GLYCOPYRROLATE 0.2 MG/ML IJ SOLN: 0.2000 mg | INTRAMUSCULAR | Status: AC

## 2015-09-18 MED ORDER — METOPROLOL TARTRATE 1 MG/ML IV SOLN: 2.5000 mg | Freq: Three times a day (TID) | INTRAVENOUS | Status: AC

## 2015-09-18 MED ORDER — MORPHINE SULFATE (PF) 2 MG/ML IV SOLN: 1.0000 mg | INTRAVENOUS | Status: AC | PRN

## 2015-09-18 MED ORDER — ACETAMINOPHEN 10 MG/ML IV SOLN
1000.0000 mg | Freq: Once | INTRAVENOUS | Status: AC
  Administered 2015-09-18: 1000 mg via INTRAVENOUS
  Filled 2015-09-18: qty 100

## 2015-09-18 NOTE — Care Management Note (Signed)
Case Management Note  Patient Details  Name: [name] MRN: [id number] Date of Birth: Nov 12, 1929  Subjective/Objective:                    Action/Plan:  UR updated  Expected Discharge Date:                  Expected Discharge Plan:  Skilled Nursing Facility  In-House Referral:  Clinical Social Work  Discharge planning Services  CM Consult  Post Acute Care Choice:    Choice offered to:     DME Arranged:    DME Agency:     HH Arranged:    [place] Agency:     Status of Service:  In process, will continue to follow  Medicare Important Message Given:  Yes-third notification given Date Medicare IM Given:    Medicare IM give by:    Date Additional Medicare IM Given:    Additional Medicare Important Message give by:     If discussed at [place] of Stay Meetings, dates discussed:  09-18-15   Additional Comments:  Marilu Favre, RN 09/18/2015, 8:15 AM

## 2015-09-18 NOTE — Progress Notes (Signed)
Patient has a temperature 101.7F. Palliative team contacted and voicemail left because family refusing suppository and asking for IV tylenol.

## 2015-09-18 NOTE — Discharge Summary (Signed)
[facility]  Physician Discharge Summary   Patient ID: Linda [name] MRN: [id number] DOB/AGE: Jan 02, 1930 81 y.o.  Admit date: 09/09/2015 Discharge date: 09/18/2015  PCP: Blanchie Serve, MD  DISCHARGE DIAGNOSES:  Principal Problem:   UTI (lower urinary tract infection) Active Problems:   Essential hypertension   Anxiety   Acute on chronic renal failure (HCC)   Acute encephalopathy   DM (diabetes mellitus) type II controlled with renal manifestation (HCC)   Seizure disorder (HCC)   Dementia with behavioral disturbance   Acute cystitis with hematuria   Altered mental status   PAF (paroxysmal atrial fibrillation) ([place])   Palliative care encounter   RECOMMENDATIONS FOR OUTPATIENT FOLLOW UP: 1. Patient being discharged to residential hospice   DISCHARGE CONDITION: poor  Diet recommendation: Nothing by mouth.  Filed Weights   09/16/15 0417 09/17/15 0644 09/18/15 0601  Weight: 78.2 kg (172 lb 6.4 oz) 75.8 kg (167 lb 1.7 oz) 74.2 kg (163 lb 9.3 oz)    INITIAL HISTORY: 81 year old Caucasian female with a past medical history of dementia, seizure disorder, TIA, presented to the hospital with altered mental status. She lives in a skilled nursing facility. Patient is a DO NOT RESUSCITATE. Initially it was felt that her mental status changes could be due to narcotics. However, it appears to be secondary to infection and metabolic derangements. She was also noted to have acute renal failure. Her renal function improved with IV fluids. However, her mental status remained the same. She remained minimally responsive. Palliative medicine was consulted.  Consultations:  Palliative medicine   HOSPITAL COURSE:   Toxic metabolic encephalopathy Patient presented with altered mental status. This was most likely due to infection and renal failure. Patient's mental status has remained unchanged for the most part, despite treatment of infection and improvement in renal function. She  was noted to be in pain. Pain was appropriately treated. Palliative medicine was consulted. She is thought to be terminally ill and in end-of-life stage. Palliative medicine held discussions with family. Plan is for discharge to residential hospice.  Aspiration pneumonia Chest X-ray showed worsening right lower lobe infiltrate. Patient is likely aspirating. He was initially treated with Zosyn. Hasn't shown much improvement. Okay to stop antibiotics. Prognosis remains poor.  UTI versus pyelonephritis Urine cultures did not show any growth.  Acute kidney injury Baseline creatinine is around 1.7. Creatinine was getting worse with worsening BUN. She was noted to be significantly volume depleted. Rate of IV fluids was increased and creatinine started improving. BUN was also improving. However, her mental status remained unchanged. Renal ultrasound report was also reviewed.   Paroxysmal atrial fibrillation on 11/2 No further recurrence of tachycardia. Seems to be maintaining sinus rhythm with intravenous metoprolol. Atrial fibrillation was likely due to acute illness. She is not a candidate for further workup or for anticoagulation due to her advanced dementia.  History of seizure disorder Continue intravenous Keppra as she is unable to safely take orally.  Hypotension in the setting of known essential hypertension Blood pressure medications on hold. Blood pressure has improved with fluid boluses.  Type 2 diabetes CBG's are reasonably well controlled.  Hypernatremia Secondary to dehydration. Improved with hydration.   History of dementia As above  Patient's life expectancy is a few days to 2-3 weeks. Okay for discharge to residential hospice.    PERTINENT LABS:  The results of significant diagnostics from this hospitalization (including imaging, microbiology, ancillary and laboratory) are listed below for reference.    Microbiology: Recent Results (from the  past 240 hour(s))  Urine  culture     Status: None   Collection Time: 09/09/15  6:55 PM  Result Value Ref Range Status   Specimen Description URINE, RANDOM  Final   Special Requests NONE  Final   Culture NO GROWTH 1 DAY  Final   Report Status 09/10/2015 FINAL  Final  Culture, blood (routine x 2)     Status: None   Collection Time: 09/10/15 12:04 AM  Result Value Ref Range Status   Specimen Description BLOOD LEFT HAND  Final   Special Requests IN PEDIATRIC BOTTLE 1CC  Final   Culture NO GROWTH 5 DAYS  Final   Report Status 09/15/2015 FINAL  Final  Culture, blood (routine x 2)     Status: None   Collection Time: 09/10/15 12:10 AM  Result Value Ref Range Status   Specimen Description BLOOD RIGHT HAND  Final   Special Requests IN PEDIATRIC BOTTLE 1CC  Final   Culture NO GROWTH 5 DAYS  Final   Report Status 09/15/2015 FINAL  Final     Labs: Basic Metabolic Panel:  Recent Labs Lab 09/12/15 0415 09/13/15 0844 09/14/15 0535 09/15/15 0614 09/16/15 0139  NA 146* 141 140 138 137  K 5.3* 4.7 4.2 4.0 4.6  CL 108 105 103 102 104  CO2 26 26 26 26 25   GLUCOSE 134* 155* 154* 159* 106*  BUN 67* 84* 73* 55* 57*  CREATININE 4.22* 3.18* 1.87* 1.34* 1.37*  CALCIUM 8.6* 9.0 9.1 8.6* 8.4*  PHOS 4.0  --   --   --   --    Liver Function Tests:  Recent Labs Lab 09/12/15 0415  ALBUMIN 2.6*   CBC:  Recent Labs Lab 09/12/15 0415 09/13/15 0844 09/14/15 0535 09/15/15 0614  WBC 16.8* 13.3* 11.3* 10.4  NEUTROABS 15.4*  --   --   --   HGB 9.7* 9.5* 9.7* 9.7*  HCT 31.3* 29.6* 28.4* 27.9*  MCV 97.5 94.0 91.6 91.5  PLT 195 207 197 176    CBG:  Recent Labs Lab 09/17/15 1659 09/17/15 2134 09/17/15 2349 09/18/15 0426 09/18/15 0812  GLUCAP 118* 108* 113* 126* 110*     IMAGING STUDIES Dg Chest 2 View  09/09/2015  CLINICAL DATA:  Shortness of breath, hypoxia, unresponsive EXAM: CHEST  2 VIEW COMPARISON:  11/25/2014 FINDINGS: Mild patchy right lower lobe opacity, atelectasis versus pneumonia. Left lung is  clear. No pleural effusion or pneumothorax. Cardiomegaly. Degenerative changes of the visualized thoracolumbar spine. Right shoulder arthroplasty. IMPRESSION: Mild patchy right lower lobe opacity, atelectasis versus pneumonia. Electronically Signed   By: Julian Hy M.D.   On: 09/09/2015 18:09   Ct Head Wo Contrast  09/09/2015  CLINICAL DATA:  Altered mental status, lethargy EXAM: CT HEAD WITHOUT CONTRAST TECHNIQUE: Contiguous axial images were obtained from the base of the skull through the vertex without intravenous contrast. COMPARISON:  07/10/2014 FINDINGS: Calvarium intact. No significant inflammatory change in the visualized portions of the sinuses. There is age-appropriate atrophy and low attenuation in the deep white matter. No evidence of vascular territory infarct. No hemorrhage or extra-axial fluid. Partially calcified 1 cm mass arising off the inner table of the left parietal lobe image number 23 likely a meningioma. It is stable. IMPRESSION: Age-related involutional change with no significant acute abnormality. Electronically Signed   By: Skipper Cliche M.D.   On: 09/09/2015 18:44   US Renal Port  09/11/2015  CLINICAL DATA:  81 year old female with a history of diabetes and acute  kidney injury EXAM: RENAL / URINARY TRACT ULTRASOUND COMPLETE COMPARISON:  CT 04/11/2014, MR 04/13/2014 FINDINGS: Right Kidney: Length: 10.7 cm. Echogenicity similar to that of the adjacent liver. No hydronephrosis. Flow confirmed in the hilum of the right kidney. Left Kidney: Length: 9.2 cm. Echogenicity relatively increase compared to the adjacent spleen. Cortical thinning with lobulated contour, similar to comparison cross-sectional imaging. Anechoic structure on the inferior aspect of the left kidney without internal flow measures 1.9 cm x 1.3 cm x 1.6 cm, characterized as cysts on prior MR. Bladder: Hyperechoic material layer dependently within the urinary bladder. IMPRESSION: No evidence of hydronephrosis.  Increased echogenicity of left kidney suggesting medical renal disease. Hyperechoic material layered dependently within urinary bladder, potentially debris, complex fluid, or blood. Correlation with urinalysis may be useful. Signed, Dulcy Fanny. Earleen Newport, DO [facility] Electronically Signed   By: Corrie Mckusick D.O.   On: 09/11/2015 19:40   Dg Chest Port 1 View  09/10/2015  CLINICAL DATA:  Hypoxia, shortness of breath EXAM: PORTABLE CHEST - 1 VIEW COMPARISON:  09/09/2015 FINDINGS: Cardiac shadow is mildly enlarged but stable. Significant increase in the degree of right basilar infiltrate is noted when compare with the previous day. Left lung remains clear. Degenerative changes of thoracic spine are noted. Postsurgical changes in cervical spine and right shoulder are seen. IMPRESSION: Significant increase in right basilar infiltrate from the previous day. Electronically Signed   By: Inez Catalina M.D.   On: 09/10/2015 18:26    DISCHARGE EXAMINATION: Filed Vitals:   09/16/15 2051 09/17/15 0644 09/17/15 2132 09/18/15 0601  BP: 126/53 125/56    Pulse: 60 71 79 78  Temp: 97.3 F (36.3 C) 98.6 F (37 C) 99.7 F (37.6 C) 100.4 F (38 C)  TempSrc: Axillary Axillary Axillary Oral  Resp: 17 18 18 19   Weight:  75.8 kg (167 lb 1.7 oz)  74.2 kg (163 lb 9.3 oz)  SpO2: 100% 98%  90%   General appearance: fatigued and no distress Resp: Diminished entry at the bases. No definite crackles or wheezing. Cardio: regular rate and rhythm, S1, S2 normal, no murmur, click, rub or gallop GI: soft, non-tender; bowel sounds normal; no masses,  no organomegaly  DISPOSITION: Residential hospice  ALLERGIES:  Allergies  Allergen Reactions  . Amoxicillin Other (See Comments)    aching all over, has tolerated Zosyn - listed as allergy on [facility] 09/09/15  . Atorvastatin Other (See Comments)    REACTION: increased CPK - not listed on [facility] 09/09/15  . Cephalexin Nausea  Only and Other (See Comments)    Weak, Headache, has tolerated ceftriaxone - listed as allergy on [facility] 09/09/15  . Cetirizine Hcl Other (See Comments)    REACTION: makes her head feel big - not listed on [facility] 09/09/15  . Cortisone Other (See Comments)    REACTION: increased bp - not listed on [facility] 09/09/15  . Ezetimibe-Simvastatin Other (See Comments)    Unknown - not listed on [facility] 09/09/15  . Fentanyl Other (See Comments)    REACTION: stinging in her skin - not listed on [facility] 09/09/15  . Guaifenesin Other (See Comments)    Unknown - not listed on [facility] 09/09/15  . Ketorolac Tromethamine Other (See Comments)    REACTION: not work - not listed on [facility] 09/09/15  . Lisinopril Cough    ACE related - listed as allergy on [facility] 09/09/15  . Metformin Other (See Comments)    Epistaxis - not listed on [facility]  09/09/15  . Nsaids Other (See Comments)    Epistaxis - listed as allergy on [facility] 09/09/15  . Penicillins Nausea Only and Other (See Comments)    Headache, has tolerated Zosyn - listed as allergy on [facility] 09/09/15  . Rofecoxib Itching    Not listed on [facility] 09/09/15  . Rosuvastatin Other (See Comments)    Myalgias - not listed on [facility] 09/09/15  . Simvastatin Other (See Comments)    Unknown - not listed on [facility] 09/09/15  . Spironolactone Nausea Only    Listed as allergy on [facility] 09/09/15  . Sulfamethoxazole-Trimethoprim Other (See Comments)    Unknown - not listed on [facility] 09/09/15  . Codeine Rash    Not listed on [facility] 09/09/15  . Tape Rash    Not listed on [facility] 09/09/15     Current Discharge Medication List    START taking these medications   Details  albuterol (PROVENTIL) (2.5 MG/3ML) 0.083% nebulizer solution Take 3 mLs (2.5 mg total) by nebulization every 4 (four) hours as needed for wheezing or shortness of breath. Qty: 75 mL, Refills: 12    antiseptic oral rinse (CPC / CETYLPYRIDINIUM CHLORIDE 0.05%) 0.05 % LIQD solution 7 mLs by Mouth Rinse route 2 (two) times daily. Refills: 0      glycopyrrolate (ROBINUL) 0.2 MG/ML injection Inject 1 mL (0.2 mg total) into the vein every 4 (four) hours. Qty: 1 mL    levETIRAcetam 500 mg in sodium chloride 0.9 % 100 mL Inject 500 mg into the vein every 12 (twelve) hours.    LORazepam (ATIVAN) 2 MG/ML injection Inject 0.13-0.25 mLs (0.26-0.5 mg total) into the vein every 4 (four) hours as needed for anxiety. Qty: 1 mL, Refills: 0    metoprolol (LOPRESSOR) 1 MG/ML injection Inject 2.5 mLs (2.5 mg total) into the vein every 8 (eight) hours. Qty: 5 mL    morphine 2 MG/ML injection Inject 0.5-1 mLs (1-2 mg total) into the vein every 2 (two) hours as needed (dyspnea). Qty: 1 mL, Refills: 0    ondansetron (ZOFRAN) 4 MG/2ML SOLN injection Inject 2 mLs (4 mg total) into the vein every 6 (six) hours as needed for nausea or vomiting. Qty: 2 mL, Refills: 0      CONTINUE these medications which have NOT CHANGED   Details  acetaminophen (TYLENOL) 325 MG tablet Take 650 mg by mouth 3 (three) times daily. Do not exceed 4 grams of tylenol in 24 hours Qty: 120 tablet, Refills: 11   Associated Diagnoses: Degeneration of cervical intervertebral disc    nystatin cream (MYCOSTATIN) Apply 1 application topically See admin instructions. Apply to abdominal (under) pannus and groin areas every shift until resolved, also apply to any skin fold as needed for moisture related rashes    OXYGEN Inhale 3 L into the lungs continuous.      STOP taking these medications     alendronate (FOSAMAX) 70 MG tablet      Calcium Carbonate-Vitamin D (CALCIUM-D) 600-400 MG-UNIT TABS      Cranberry 250 MG TABS      donepezil (ARICEPT) 10 MG tablet      escitalopram (LEXAPRO) 20 MG tablet      feeding supplement, GLUCERNA SHAKE, (GLUCERNA SHAKE) LIQD      ferrous sulfate 325 (65 FE) MG tablet      furosemide (LASIX) 20 MG tablet      levETIRAcetam (KEPPRA) 500 MG tablet      LORazepam (ATIVAN) 0.5 MG tablet  Melatonin 3 MG CAPS      memantine  (NAMENDA XR) 14 MG CP24 24 hr capsule      mirabegron ER (MYRBETRIQ) 25 MG TB24 tablet      oxyCODONE-acetaminophen (PERCOCET) 10-325 MG per tablet      polyethylene glycol (MIRALAX / GLYCOLAX) packet      risperiDONE (RISPERDAL) 1 MG/ML oral solution      senna-docusate (SENNA-S) 8.6-50 MG tablet      tizanidine (ZANAFLEX) 2 MG capsule      traZODone (DESYREL) 50 MG tablet      zinc oxide (BALMEX) 11.3 % CREA cream      alum & mag hydroxide-simeth (MAALOX/MYLANTA) 128-786-76 MG/5ML suspension          TOTAL DISCHARGE TIME: 35 minutes  [facility]  [facility] Pager [phone]  09/18/2015, 11:20 AM

## 2015-09-18 NOTE — Clinical Social Work Note (Signed)
CSW received referral for [facility], patient's family agreeable to [facility] per palliative and Metallurgist.  CSW was informed that [facility] has a bed available today, CSW notified physician.  [name]. Salaya Holtrop, MSW, Latanya Presser [phone] 09/18/2015 10:55 AM

## 2015-09-18 NOTE — Clinical Social Work Note (Signed)
Patient to be d/c'ed today to [facility].  Patient and family agreeable to plans will transport via ems RN to call report.  Abbie Jablon, MSW, LCSWA [phone]  

## 2015-09-18 NOTE — Progress Notes (Signed)
Daily Progress Note   Patient Name: Linda [name]       Date: 09/18/2015 DOB: 1930/06/03  Age: 81 y.o. MRN#: [id number] Attending Physician: Bonnielee Haff, MD Primary Care Physician: Blanchie Serve, MD Admit Date: 09/09/2015  Reason for Consultation/Follow-up: Establishing goals of care  Subjective: I met today with Ms. Zenz's daughter, [name], at bedside and [name] over the phone. They tell me that they would for her to transition to [facility] for comfort and EOL care. We discussed the philosophy of comfort focused care at hospice and less focus on vital signs and IVF. They understand IVF and antibiotics, etc will not be continued in hospice. Ms. Holte is progressing and requiring multiple prn doses of morphine and ativan for comfort. I will add robinul for management of secretions. Goal is comfort and family recognizes she is at end of life. Emotional support provided.   Length of Stay: 8 days  Current Medications: Scheduled Meds:  . antiseptic oral rinse  7 mL Mouth Rinse BID  . glycopyrrolate  0.2 mg Intravenous Q4H  . levETIRAcetam  500 mg Intravenous Q12H  . metoprolol  2.5 mg Intravenous 3 times per day  .  morphine injection  1 mg Intravenous 6 times per day  . nystatin cream  1 application Topical Daily  . piperacillin-tazobactam (ZOSYN)  IV  3.375 g Intravenous 3 times per day    Continuous Infusions: . dextrose 75 mL/hr at 09/17/15 2048    PRN Meds: acetaminophen **OR** acetaminophen, albuterol, LORazepam, morphine injection, ondansetron (ZOFRAN) IV  Physical Exam: Physical Exam  Constitutional: She appears well-developed. She has a sickly appearance.  Cardiovascular: Normal rate.   Pulmonary/Chest: Accessory muscle usage present. No tachypnea. No respiratory  distress.  Congested  Abdominal: Soft. Normal appearance. She exhibits no distension.  Neurological: She is unresponsive.  Opens eyes occasionally randomly - not to command. No tracking.                 Vital Signs: BP 125/56 mmHg  Pulse 78  Temp(Src) 100.4 F (38 C) (Oral)  Resp 19  Wt 74.2 kg (163 lb 9.3 oz)  SpO2 90% SpO2: SpO2: 90 % O2 Device: O2 Device: NRB O2 Flow Rate: O2 Flow Rate (L/min): 10 L/min  Intake/output summary:   Intake/Output Summary (Last 24 hours) at 09/18/15  0539 Last data filed at 09/18/15 7673  Gross per 24 hour  Intake   1115 ml  Output    650 ml  Net    465 ml   Baseline Weight: Weight: 70.171 kg (154 lb 11.2 oz) Most recent weight: Weight: 74.2 kg (163 lb 9.3 oz)       Palliative Assessment/Data: Flowsheet Rows        Most Recent Value   Intake Tab    Referral Department  Hospitalist   Unit at Time of Referral  Med/Surg Unit   Palliative Care Primary Diagnosis  Sepsis/Infectious Disease   Date Notified  09/09/15   Palliative Care Type  New Palliative care   Reason for referral  Clarify Goals of Care, Pain   Date of Admission  09/09/15   Date first seen by Palliative Care  09/14/15   # of days Palliative referral response time  5 Day(s)   # of days IP prior to Palliative referral  0   Clinical Assessment    Palliative Performance Scale Score  10%   Pain Max last 24 hours  Other (Comment)   Pain Min Last 24 hours  Other (Comment)   Dyspnea Max Last 24 Hours  Other (Comment)   Dyspnea Min Last 24 hours  Other (Comment)   Nausea Max Last 24 Hours  Other (Comment)   Nausea Min Last 24 Hours  Other (Comment)   Anxiety Max Last 24 Hours  Other (Comment)   Anxiety Min Last 24 Hours  Other (Comment)   Other Max Last 24 Hours  Other (Comment)   Psychosocial & Spiritual Assessment    Palliative Care Outcomes    Patient/Family meeting held?  Yes   Who was at the meeting?  daughte Norberta Keens, and [place] Outcomes  Improved  pain interventions, Improved non-pain symptom therapy, Clarified goals of care   Palliative Care follow-up planned  Yes, Facility      Additional Data Reviewed: Recent Labs     09/16/15  0139  NA  137  BUN  57*  CREATININE  1.37*     Problem List:  Patient Active Problem List   Diagnosis Date Noted  . PAF (paroxysmal atrial fibrillation) ([place]) 09/14/2015  . Palliative care encounter   . Acute cystitis with hematuria   . Altered mental status   . Dementia   . Pulmonary hypertension ([place])   . Diabetes mellitus type 2, insulin dependent ([place])   . Daytime somnolence 07/22/2015  . Depression due to dementia 06/07/2015  . CKD (chronic kidney disease) 02/12/2015  . Osteoporosis 02/12/2015  . Anemia of chronic disease 02/12/2015  . Dementia with behavioral disturbance 02/12/2015  . Protein-calorie malnutrition ([place]) 02/12/2015  . COPD, mild ([place]) 09/28/2014  . Hypoxia 09/05/2014  . Depression with anxiety 08/30/2014  . UI (urinary incontinence) 08/25/2014  . Dementia without behavioral disturbance 08/25/2014  . Constipation 08/25/2014  . UTI (lower urinary tract infection) 08/21/2014  . DM (diabetes mellitus) type II controlled with renal manifestation ([place]) 08/21/2014  . Seizure disorder ([place]) 08/21/2014  . Acute encephalopathy 04/10/2014  . Fall at home 06/11/2013  . Orthostatic hypotension 06/11/2013  . Acute on chronic renal failure ([place]) 06/11/2013  . Anemia 08/27/2012  . Benign hypertensive heart disease without heart failure 06/22/2012  . Anxiety 06/08/2011  . VENOUS INSUFFICIENCY, LEGS 06/28/2009  . EDEMA 06/20/2009  . Obesity 04/26/2008  . GOITER, MULTINODULAR 10/13/2007  . HYPERCHOLESTEROLEMIA 10/13/2007  . Essential hypertension 10/13/2007  .  ALLERGIC RHINITIS 10/13/2007  . RENAL INSUFFICIENCY 10/13/2007  . DEGENERATIVE DISC DISEASE, CERVICAL SPINE 10/13/2007  . Disorder of bone and cartilage 10/13/2007  . TRANSIENT ISCHEMIC ATTACK, HX OF 10/13/2007  .  DIABETES MELLITUS, TYPE II 03/25/2007     Palliative Care Assessment & Plan    1.Code Status:  DNR    Code Status Orders        Start     Ordered   09/09/15 2119  Do not attempt resuscitation (DNR)   Continuous    Question Answer Comment  In the event of cardiac or respiratory ARREST Do not call a "code blue"   In the event of cardiac or respiratory ARREST Do not perform Intubation, CPR, defibrillation or ACLS   In the event of cardiac or respiratory ARREST Use medication by any route, position, wound care, and other measures to relive pain and suffering. May use oxygen, suction and manual treatment of airway obstruction as needed for comfort.      09/09/15 2121    Advance Directive Documentation        Most Recent Value   Type of Advance Directive  Healthcare Power of Attorney   Pre-existing out of facility DNR order (yellow form or pink MOST form)     "MOST" Form in Place?         2. Goals of Care:  Goal is comfort.   Desire for further Chaplaincy support:yes  Psycho-social Needs: Education on Hospice  3. Symptom Management:      1.Pain: Improving. Morphine 1 mg every 4 hours and every 2 hours prn.   2. Dyspnea: Cont 02 via NRB. No not escalate to BIPAP. Primary management with opioids  3. Agitation: Cont ativan prn. Monitor for need for scheduled dosing. Family at this point wants to keep prn.       4. Secretions: Robinul 0.2 mg every 4 hours.   4. Palliative Prophylaxis:   Bowel Regimen, Delirium Protocol and Turn Reposition  5. Prognosis: < 2 weeks  6. Discharge Planning:  Hospice facility.     Thank you for allowing the Palliative Medicine Team to assist in the care of this patient.   Time In: 0925-1000 Total Time 48mn Prolonged Time Billed  no         APershing Proud NP  118/12/8831 9:57 AM  Please contact Palliative Medicine Team phone at [phone] questions and concerns.

## 2015-09-18 NOTE — Progress Notes (Signed)
Report called to [facility]. Patient to be d/c via [facility] which has been called. Foley Catheter in place for EOL care and secured for transport.

## 2015-09-18 NOTE — Discharge Instructions (Signed)
Hospice °Hospice is a service that is designed to provide people who are terminally ill and their families with medical, spiritual, and psychological support. Its aim is to improve your quality of life by keeping you as alert and comfortable as possible. Hospice is performed by a team of health care professionals and volunteers who: °· Help keep you comfortable. Hospice can be provided in your home or in a homelike setting. The hospice staff works with your family and friends to help meet your needs. You will enjoy the support of loved ones by receiving much of your basic care from family and friends. °· Provide pain relief and manage your symptoms. The staff supply all necessary medicines and equipment. °· Provide companionship when you are alone. °· Allow you and your family to rest. They may do light housekeeping, prepare meals, and run errands. °· Provide counseling. They will make sure your emotional, spiritual, and social needs and those of your family are being met. °· Provide spiritual care. Spiritual care is individualized to meet your needs and your family's needs. It may involve helping you look at what death means to you, say goodbye, or perform a specific religious ceremony or ritual. °Hospice teams often include: °· A nurse. °· A doctor. °· Social workers. °· Religious leaders (such as a chaplain). °· Trained volunteers. °WHEN SHOULD HOSPICE CARE BEGIN? °Most people who use hospice are believed to have fewer than 6 months to live. Your family and health care providers can help you decide when hospice services should begin. If your condition improves, you may discontinue the program. °WHAT SHOULD I CONSIDER BEFORE SELECTING A PROGRAM? °Most hospice programs are run by nonprofit, independent organizations. Some are affiliated with hospitals, nursing homes, or home health care agencies. Hospice programs can take place in the home or at a hospice center, hospital, or skilled nursing facility. When choosing  a hospice program, ask the following questions: °· What services are available to me? °· What services are offered to my loved ones? °· How involved are my loved ones? °· How involved is my health care provider? °· Who makes up the hospice care team? How are they trained or screened? °· How will my pain and symptoms be managed? °· If my circumstances change, can the services be provided in a different setting, such as my home or in the hospital? °· Is the program reviewed and licensed by the state or certified in some other way? °WHERE CAN I LEARN MORE ABOUT HOSPICE? °You can learn about existing hospice programs in your area from your health care providers. You can also read more about hospice online. The websites of the following organizations contain helpful information: °· The National Hospice and Palliative Care Organization (NHPCO). °· The Hospice Association of America (HAA). °· The Hospice Education Institute. °· The American Cancer Society (ACS). °· Hospice Net. °  °This information is not intended to replace advice given to you by your health care provider. Make sure you discuss any questions you have with your health care provider. °  °Document Released: 02/13/2004 Document Revised: 11/01/2013 Document Reviewed: 09/06/2013 °Elsevier Interactive Patient Education ©2016 [employer] ° °

## 2015-09-18 NOTE — Progress Notes (Signed)
[facility] [employer]   Received request from [place] for family interest in [place] with request to transfer today.  Chart reviewed.  Met with family to confirm interest and explain services. Family agreeable to transfer today. CSW aware. Registration paperwork completed.  Dr. Tomasa Hosteller to assume care per family request.  Please fax discharge summary to [phone]. ? RN please call report to [phone]. ? Please arrange transport as soon as possible.  ? Thank you, Freddi Starr RN, [facility] Liaison [phone]

## 2015-10-11 DEATH — deceased

## 2018-06-23 ENCOUNTER — Encounter: Payer: Self-pay | Admitting: Internal Medicine
